# Patient Record
Sex: Male | Born: 1950 | Race: White | Hispanic: No | Marital: Married | Smoking: Former smoker
Health system: Southern US, Community
[De-identification: ages and names within clinical notes are randomized; demographics above are authoritative.]

## PROBLEM LIST (undated history)

## (undated) DIAGNOSIS — Z9889 Other specified postprocedural states: Secondary | ICD-10-CM

## (undated) DIAGNOSIS — E119 Type 2 diabetes mellitus without complications: Secondary | ICD-10-CM

## (undated) DIAGNOSIS — M1990 Unspecified osteoarthritis, unspecified site: Secondary | ICD-10-CM

## (undated) DIAGNOSIS — I10 Essential (primary) hypertension: Secondary | ICD-10-CM

## (undated) DIAGNOSIS — R112 Nausea with vomiting, unspecified: Secondary | ICD-10-CM

## (undated) DIAGNOSIS — G4700 Insomnia, unspecified: Secondary | ICD-10-CM

## (undated) DIAGNOSIS — I2510 Atherosclerotic heart disease of native coronary artery without angina pectoris: Secondary | ICD-10-CM

## (undated) DIAGNOSIS — I639 Cerebral infarction, unspecified: Secondary | ICD-10-CM

## (undated) DIAGNOSIS — I219 Acute myocardial infarction, unspecified: Secondary | ICD-10-CM

## (undated) HISTORY — PX: OTHER SURGICAL HISTORY: SHX169

## (undated) HISTORY — PX: URETHRAL STRICTURE DILATATION: SHX477

## (undated) HISTORY — DX: Cerebral infarction, unspecified: I63.9

## (undated) HISTORY — PX: CARDIAC CATHETERIZATION: SHX172

## (undated) HISTORY — PX: NOSE SURGERY: SHX723

## (undated) HISTORY — DX: Insomnia, unspecified: G47.00

## (undated) HISTORY — PX: COLONOSCOPY W/ POLYPECTOMY: SHX1380

## (undated) HISTORY — PX: PROSTATE BIOPSY: SHX241

---

## 2014-12-03 ENCOUNTER — Emergency Department (HOSPITAL_BASED_OUTPATIENT_CLINIC_OR_DEPARTMENT_OTHER)
Admission: EM | Admit: 2014-12-03 | Discharge: 2014-12-03 | Disposition: A | Discharge status: Home / Self Care | Payer: Worker's Compensation | Attending: Emergency Medicine | Admitting: Emergency Medicine

## 2014-12-03 ENCOUNTER — Emergency Department (HOSPITAL_BASED_OUTPATIENT_CLINIC_OR_DEPARTMENT_OTHER): Payer: Worker's Compensation

## 2014-12-03 ENCOUNTER — Encounter (HOSPITAL_BASED_OUTPATIENT_CLINIC_OR_DEPARTMENT_OTHER): Payer: Self-pay

## 2014-12-03 DIAGNOSIS — I10 Essential (primary) hypertension: Secondary | ICD-10-CM | POA: Insufficient documentation

## 2014-12-03 DIAGNOSIS — I2510 Atherosclerotic heart disease of native coronary artery without angina pectoris: Secondary | ICD-10-CM | POA: Insufficient documentation

## 2014-12-03 DIAGNOSIS — Y9289 Other specified places as the place of occurrence of the external cause: Secondary | ICD-10-CM | POA: Diagnosis not present

## 2014-12-03 DIAGNOSIS — S199XXA Unspecified injury of neck, initial encounter: Secondary | ICD-10-CM | POA: Insufficient documentation

## 2014-12-03 DIAGNOSIS — I252 Old myocardial infarction: Secondary | ICD-10-CM | POA: Insufficient documentation

## 2014-12-03 DIAGNOSIS — S3992XA Unspecified injury of lower back, initial encounter: Secondary | ICD-10-CM | POA: Diagnosis present

## 2014-12-03 DIAGNOSIS — E119 Type 2 diabetes mellitus without complications: Secondary | ICD-10-CM | POA: Diagnosis not present

## 2014-12-03 DIAGNOSIS — Y9389 Activity, other specified: Secondary | ICD-10-CM | POA: Insufficient documentation

## 2014-12-03 DIAGNOSIS — S39012A Strain of muscle, fascia and tendon of lower back, initial encounter: Secondary | ICD-10-CM | POA: Insufficient documentation

## 2014-12-03 DIAGNOSIS — W19XXXA Unspecified fall, initial encounter: Secondary | ICD-10-CM

## 2014-12-03 DIAGNOSIS — Y990 Civilian activity done for income or pay: Secondary | ICD-10-CM | POA: Insufficient documentation

## 2014-12-03 DIAGNOSIS — W108XXA Fall (on) (from) other stairs and steps, initial encounter: Secondary | ICD-10-CM | POA: Diagnosis not present

## 2014-12-03 DIAGNOSIS — S0990XA Unspecified injury of head, initial encounter: Secondary | ICD-10-CM | POA: Diagnosis not present

## 2014-12-03 HISTORY — DX: Essential (primary) hypertension: I10

## 2014-12-03 HISTORY — DX: Atherosclerotic heart disease of native coronary artery without angina pectoris: I25.10

## 2014-12-03 HISTORY — DX: Type 2 diabetes mellitus without complications: E11.9

## 2014-12-03 HISTORY — DX: Acute myocardial infarction, unspecified: I21.9

## 2014-12-03 LAB — BASIC METABOLIC PANEL
Anion gap: 7 (ref 5–15)
BUN: 11 mg/dL (ref 6–23)
CALCIUM: 8.9 mg/dL (ref 8.4–10.5)
CHLORIDE: 105 mmol/L (ref 96–112)
CO2: 25 mmol/L (ref 19–32)
Creatinine, Ser: 0.57 mg/dL (ref 0.50–1.35)
GFR calc Af Amer: >90 mL/min (ref >90)
Glucose, Bld: 146 mg/dL — ABNORMAL HIGH (ref 70–99)
POTASSIUM: 4.4 mmol/L (ref 3.5–5.1)
SODIUM: 137 mmol/L (ref 135–145)

## 2014-12-03 LAB — CBC WITH DIFFERENTIAL/PLATELET
Basophils Absolute: 0 10*3/uL (ref 0.0–0.1)
Basophils Relative: 0 % (ref 0–1)
EOS PCT: 1 % (ref 0–5)
Eosinophils Absolute: 0.1 10*3/uL (ref 0.0–0.7)
HEMATOCRIT: 39.9 % (ref 39.0–52.0)
HEMOGLOBIN: 13.3 g/dL (ref 13.0–17.0)
LYMPHS ABS: 1.9 10*3/uL (ref 0.7–4.0)
LYMPHS PCT: 19 % (ref 12–46)
MCH: 29.4 pg (ref 26.0–34.0)
MCHC: 33.3 g/dL (ref 30.0–36.0)
MCV: 88.1 fL (ref 78.0–100.0)
Monocytes Absolute: 0.6 10*3/uL (ref 0.1–1.0)
Monocytes Relative: 6 % (ref 3–12)
NEUTROS ABS: 7.5 10*3/uL (ref 1.7–7.7)
Neutrophils Relative %: 74 % (ref 43–77)
Platelets: 184 10*3/uL (ref 150–400)
RBC: 4.53 MIL/uL (ref 4.22–5.81)
RDW: 13.6 % (ref 11.5–15.5)
WBC: 10.1 10*3/uL (ref 4.0–10.5)

## 2014-12-03 MED ORDER — SODIUM CHLORIDE 0.9 % IV BOLUS (SEPSIS)
500.0000 mL | Freq: Once | INTRAVENOUS | Status: AC
  Administered 2014-12-03: 500 mL via INTRAVENOUS

## 2014-12-03 MED ORDER — ONDANSETRON HCL 4 MG/2ML IJ SOLN
4.0000 mg | Freq: Once | INTRAMUSCULAR | Status: AC
  Administered 2014-12-03: 4 mg via INTRAVENOUS
  Filled 2014-12-03: qty 2

## 2014-12-03 MED ORDER — CYCLOBENZAPRINE HCL 10 MG PO TABS: 10.0000 mg | ORAL_TABLET | Freq: Two times a day (BID) | ORAL | Status: DC | PRN

## 2014-12-03 MED ORDER — SODIUM CHLORIDE 0.9 % IV SOLN
INTRAVENOUS | Status: DC
  Administered 2014-12-03: 12:00:00 via INTRAVENOUS

## 2014-12-03 MED ORDER — FENTANYL CITRATE (PF) 100 MCG/2ML IJ SOLN
50.0000 ug | Freq: Once | INTRAMUSCULAR | Status: AC
  Administered 2014-12-03: 50 ug via INTRAVENOUS
  Filled 2014-12-03: qty 2

## 2014-12-03 MED ORDER — HYDROCODONE-ACETAMINOPHEN 5-325 MG PO TABS: 1.0000 | ORAL_TABLET | Freq: Four times a day (QID) | ORAL | Status: DC | PRN

## 2014-12-03 NOTE — ED Notes (Addendum)
EMS reports fall down 3 wet steps today at work at [employer] - pt states he fell backwards and hit the metal steps - hit his head - pt reports pain to lumbar region, left flank pain - workers comp claim, unknown if UDS required. Pt with C Collar in place and fully immobilized on back board. Pt did urinate on himself.

## 2014-12-03 NOTE — Discharge Instructions (Signed)
CT scan of head neck and lower back without any acute injuries. Expect muscle soreness over the next several days. Take medicine as directed. Rest as much as possible. Follow-up with your doctor if not improving over the next few days. Return for any new or worse symptoms.

## 2014-12-03 NOTE — ED Provider Notes (Signed)
CSN: [id number]     Arrival date & time 12/03/14  1151 History   First MD Initiated Contact with Patient 12/03/14 1200     Chief Complaint  Patient presents with  . Fall     (Consider location/radiation/quality/duration/timing/severity/associated sxs/prior Treatment) Patient is a 64 y.o. male presenting with fall. The history is provided by the EMS personnel and the patient.  Fall Associated symptoms include headaches. Pertinent negatives include no chest pain, no abdominal pain and no shortness of breath.   patient with fall on the job. Fell down 3 metal steps. While working at [employer]. Patient states he fell backward and hit the metal steps hit his head low part of the back. Patient arrived on backboard and c-collar. Backboard was removed clinically shortly after arrival c-collar was kept in place.  This did occur on job but currently no Workmen's Comp. paperwork is present. Not clear whether the job requires her urine drug screen.  Past Medical History  Diagnosis Date  . Hypertension   . Diabetes mellitus without complication   . Coronary artery disease   . Myocardial infarct    History reviewed. No pertinent past surgical history. History reviewed. No pertinent family history. History  Substance Use Topics  . Smoking status: Never Smoker   . Smokeless tobacco: Not on file  . Alcohol Use: Not on file    Review of Systems  Constitutional: Negative for fever.  HENT: Negative for congestion.   Eyes: Negative for visual disturbance.  Respiratory: Negative for shortness of breath.   Cardiovascular: Negative for chest pain.  Gastrointestinal: Negative for nausea, vomiting and abdominal pain.  Genitourinary: Negative for dysuria.  Musculoskeletal: Positive for back pain and neck pain.  Skin: Negative for rash.  Neurological: Positive for headaches. Negative for dizziness, seizures, syncope, weakness and numbness.  Hematological: Does not bruise/bleed easily.   Psychiatric/Behavioral: Negative for confusion.      Allergies  Review of patient's allergies indicates no known allergies.  Home Medications   Prior to Admission medications   Medication Sig Start Date End Date Taking? Authorizing Provider  ATORVASTATIN CALCIUM PO Take by mouth.   Yes Historical Provider, MD  GLIPIZIDE PO Take by mouth.   Yes Historical Provider, MD  METFORMIN HCL PO Take by mouth.   Yes Historical Provider, MD  METOPROLOL SUCCINATE ER PO Take by mouth.   Yes Historical Provider, MD  cyclobenzaprine (FLEXERIL) 10 MG tablet Take 1 tablet (10 mg total) by mouth 2 (two) times daily as needed for muscle spasms. 12/03/14   Fredia Sorrow, MD  HYDROcodone-acetaminophen (NORCO/VICODIN) 5-325 MG per tablet Take 1-2 tablets by mouth every 6 (six) hours as needed for moderate pain. 12/03/14   Fredia Sorrow, MD   BP 144/84 mmHg  Pulse 70  Temp(Src) 97.7 F (36.5 C) (Oral)  Resp 16  Ht 5\' 10"  (1.778 m)  Wt 220 lb (99.791 kg)  BMI 31.57 kg/m2  SpO2 96% Physical Exam  Constitutional: He is oriented to person, place, and time. He appears well-developed and well-nourished. No distress.  HENT:  Head: Normocephalic and atraumatic.  Eyes: Conjunctivae and EOM are normal. Pupils are equal, round, and reactive to light.  Neck: Normal range of motion.  Cardiovascular: Normal rate and regular rhythm.   No murmur heard. Pulmonary/Chest: Effort normal and breath sounds normal. No respiratory distress.  Abdominal: Soft. Bowel sounds are normal. There is no tenderness.  Musculoskeletal: Normal range of motion. He exhibits edema and tenderness.  Some tenderness to palpation along  the lumbar part of the back. No evidence of any skin injury or abrasion.  Neurological: He is alert and oriented to person, place, and time. No cranial nerve deficit. He exhibits normal muscle tone. Coordination normal.  Skin: Skin is warm. No rash noted.  Nursing note and vitals reviewed.   ED Course   Procedures (including critical care time) Labs Review Labs Reviewed  BASIC METABOLIC PANEL - Abnormal; Notable for the following:    Glucose, Bld 146 (*)    All other components within normal limits  CBC WITH DIFFERENTIAL/PLATELET    Imaging Review Ct Head Wo Contrast  12/03/2014   CLINICAL DATA:  64 year old male with a history of fall. Cervical back pain.  EXAM: CT HEAD WITHOUT CONTRAST  CT CERVICAL SPINE WITHOUT CONTRAST  TECHNIQUE: Multidetector CT imaging of the head and cervical spine was performed following the standard protocol without intravenous contrast. Multiplanar CT image reconstructions of the cervical spine were also generated.  COMPARISON:  None.  FINDINGS: CT HEAD FINDINGS  Unremarkable appearance of the calvarium without acute fracture or aggressive lesion.  Unremarkable appearance of the scalp soft tissues.  Unremarkable appearance of the bilateral orbits.  Mastoid air cells are clear.  No significant paranasal sinus disease  Rightward angulation of the anterior nasal septum favored to represent chronic injury.  No acute intracranial hemorrhage, midline shift, or mass effect.  Gray-white differentiation is maintained, without CT evidence of acute ischemia.  Unremarkable configuration of the ventricles.  Intracranial atherosclerotic calcifications of the anterior circulation.  CT CERVICAL SPINE FINDINGS  Alignment of the cervical spine from C1-T1 maintained.  Cranio-cervical junction maintained.  Unremarkable appearance of the visualized posterior fossa.  No fracture identified.  Vertebral body heights maintained.  C1-C2: Degenerative changes at the atlantodental interface.  C2-C3: No significant bony canal narrowing. Uncovertebral joint disease, worst on the right.  C3-C4: No significant bony canal narrowing. Uncovertebral joint disease, worst on the right.  C4-C5: Bilateral uncovertebral joint disease without significant canal narrowing.  C5-C6: Bilateral uncovertebral joint disease  without significant canal narrowing.  C6-C7: Bilateral uncovertebral joint disease, worst on the left. No significant canal narrowing.  C7-T1: Posterior disc osteophyte complex without significant canal narrowing.  Upper thoracic spine:  Unremarkable.  Prevertebral soft tissues within normal limits.  No paravertebral mass lesion is present.  13 mm left thyroid nodule.  Unremarkable appearance of the aerodigestive mucosa.  Unremarkable appearance of the visualized upper lungs.  IMPRESSION: Head:  No CT evidence of acute intracranial abnormality.  Cervical Spine:  No CT evidence of acute fracture or malalignment of the cervical spine.  Mild degenerative changes, as above.  Left-sided 13 mm thyroid nodule. If there is concern for thyroid disease, referral for elective diagnostic ultrasound may be useful.  Signed,  Dulcy Fanny. Earleen Newport, DO  [facility]  [facility]   Electronically Signed   By: Corrie Mckusick D.O.   On: 12/03/2014 13:38   Ct Cervical Spine Wo Contrast  12/03/2014   CLINICAL DATA:  64 year old male with a history of fall. Cervical back pain.  EXAM: CT HEAD WITHOUT CONTRAST  CT CERVICAL SPINE WITHOUT CONTRAST  TECHNIQUE: Multidetector CT imaging of the head and cervical spine was performed following the standard protocol without intravenous contrast. Multiplanar CT image reconstructions of the cervical spine were also generated.  COMPARISON:  None.  FINDINGS: CT HEAD FINDINGS  Unremarkable appearance of the calvarium without acute fracture or aggressive lesion.  Unremarkable appearance of the scalp soft tissues.  Unremarkable appearance of the bilateral orbits.  Mastoid air cells are clear.  No significant paranasal sinus disease  Rightward angulation of the anterior nasal septum favored to represent chronic injury.  No acute intracranial hemorrhage, midline shift, or mass effect.  Gray-white differentiation is maintained, without CT evidence of acute ischemia.   Unremarkable configuration of the ventricles.  Intracranial atherosclerotic calcifications of the anterior circulation.  CT CERVICAL SPINE FINDINGS  Alignment of the cervical spine from C1-T1 maintained.  Cranio-cervical junction maintained.  Unremarkable appearance of the visualized posterior fossa.  No fracture identified.  Vertebral body heights maintained.  C1-C2: Degenerative changes at the atlantodental interface.  C2-C3: No significant bony canal narrowing. Uncovertebral joint disease, worst on the right.  C3-C4: No significant bony canal narrowing. Uncovertebral joint disease, worst on the right.  C4-C5: Bilateral uncovertebral joint disease without significant canal narrowing.  C5-C6: Bilateral uncovertebral joint disease without significant canal narrowing.  C6-C7: Bilateral uncovertebral joint disease, worst on the left. No significant canal narrowing.  C7-T1: Posterior disc osteophyte complex without significant canal narrowing.  Upper thoracic spine:  Unremarkable.  Prevertebral soft tissues within normal limits.  No paravertebral mass lesion is present.  13 mm left thyroid nodule.  Unremarkable appearance of the aerodigestive mucosa.  Unremarkable appearance of the visualized upper lungs.  IMPRESSION: Head:  No CT evidence of acute intracranial abnormality.  Cervical Spine:  No CT evidence of acute fracture or malalignment of the cervical spine.  Mild degenerative changes, as above.  Left-sided 13 mm thyroid nodule. If there is concern for thyroid disease, referral for elective diagnostic ultrasound may be useful.  Signed,  Dulcy Fanny. Earleen Newport, DO  [facility]  [facility]   Electronically Signed   By: Corrie Mckusick D.O.   On: 12/03/2014 13:38   Ct Lumbar Spine Wo Contrast  12/03/2014   CLINICAL DATA:  [place] down steps.  Back pain  EXAM: CT LUMBAR SPINE WITHOUT CONTRAST  TECHNIQUE: Multidetector CT imaging of the lumbar spine was performed without  intravenous contrast administration. Multiplanar CT image reconstructions were also generated.  COMPARISON:  None.  FINDINGS: Normal lumbar alignment. Negative for fracture or mass lesion. Multilevel degenerative change.  Atherosclerotic aorta without aneurysm. Calcification in the iliac arteries. No retroperitoneal mass.  T12-L1:  Mild disc and facet degeneration  L1-2:  Mild disc and facet degeneration  L2-3: Moderate disc degeneration with disc space narrowing and vacuum disc phenomenon. Disc bulging and spurring. Bilateral facet hypertrophy with. Mild to moderate spinal stenosis.  L3-4: Diffuse disc bulging and facet hypertrophy causing mild spinal stenosis. Mild foraminal stenosis bilaterally  L4-5: Diffuse disc bulging and endplate osteophyte formation. Moderate facet hypertrophy bilaterally with moderate spinal stenosis. Moderate subarticular and foraminal stenosis bilaterally.  L5-S1: Small central disc protrusion with diffuse disc bulging and spondylosis. Bilateral facet hypertrophy. Mild foraminal narrowing bilaterally.  IMPRESSION: Negative for lumbar fracture  Multilevel degenerative change as above.   Electronically Signed   By: Franchot Gallo M.D.   On: 12/03/2014 13:36     EKG Interpretation None      MDM   Final diagnoses:  Fall  Lumbar strain, initial encounter  Head injury, initial encounter      Patient with fall at work down 3 wet steps that were metal. With pain to his lumbar back and head and a little bit to the neck. No loss of consciousness. CT head neck and lumbar back without any acute injuries here. Patient without any neuro deficits. Patient  with stiffness in the low back. C-collar removed after CAT scans patient with good range of motion of the neck. Patient given work note we treated with pain medicine and muscle relaxer. Patient will follow-up with his doctor if not better by Monday or with employer's recommended physician for follow-up.  Fredia Sorrow,  MD 12/03/14 1414

## 2014-12-07 ENCOUNTER — Encounter (HOSPITAL_BASED_OUTPATIENT_CLINIC_OR_DEPARTMENT_OTHER): Payer: Self-pay | Admitting: Emergency Medicine

## 2014-12-07 ENCOUNTER — Emergency Department (HOSPITAL_BASED_OUTPATIENT_CLINIC_OR_DEPARTMENT_OTHER)
Admission: EM | Admit: 2014-12-07 | Discharge: 2014-12-07 | Disposition: A | Discharge status: Home / Self Care | Payer: Worker's Compensation | Attending: Emergency Medicine | Admitting: Emergency Medicine

## 2014-12-07 ENCOUNTER — Emergency Department (HOSPITAL_BASED_OUTPATIENT_CLINIC_OR_DEPARTMENT_OTHER): Payer: Worker's Compensation

## 2014-12-07 DIAGNOSIS — I252 Old myocardial infarction: Secondary | ICD-10-CM | POA: Insufficient documentation

## 2014-12-07 DIAGNOSIS — Z7902 Long term (current) use of antithrombotics/antiplatelets: Secondary | ICD-10-CM | POA: Insufficient documentation

## 2014-12-07 DIAGNOSIS — Z87891 Personal history of nicotine dependence: Secondary | ICD-10-CM | POA: Insufficient documentation

## 2014-12-07 DIAGNOSIS — R109 Unspecified abdominal pain: Secondary | ICD-10-CM | POA: Diagnosis not present

## 2014-12-07 DIAGNOSIS — I10 Essential (primary) hypertension: Secondary | ICD-10-CM | POA: Insufficient documentation

## 2014-12-07 DIAGNOSIS — Z87828 Personal history of other (healed) physical injury and trauma: Secondary | ICD-10-CM | POA: Diagnosis not present

## 2014-12-07 DIAGNOSIS — E119 Type 2 diabetes mellitus without complications: Secondary | ICD-10-CM | POA: Diagnosis not present

## 2014-12-07 DIAGNOSIS — I2510 Atherosclerotic heart disease of native coronary artery without angina pectoris: Secondary | ICD-10-CM | POA: Diagnosis not present

## 2014-12-07 DIAGNOSIS — Z79899 Other long term (current) drug therapy: Secondary | ICD-10-CM | POA: Insufficient documentation

## 2014-12-07 DIAGNOSIS — M549 Dorsalgia, unspecified: Secondary | ICD-10-CM | POA: Diagnosis present

## 2014-12-07 LAB — CBC WITH DIFFERENTIAL/PLATELET
BASOS ABS: 0 10*3/uL (ref 0.0–0.1)
Basophils Relative: 0 % (ref 0–1)
Eosinophils Absolute: 0.2 10*3/uL (ref 0.0–0.7)
Eosinophils Relative: 2 % (ref 0–5)
HEMATOCRIT: 40.7 % (ref 39.0–52.0)
Hemoglobin: 13.6 g/dL (ref 13.0–17.0)
LYMPHS PCT: 19 % (ref 12–46)
Lymphs Abs: 2 10*3/uL (ref 0.7–4.0)
MCH: 29.5 pg (ref 26.0–34.0)
MCHC: 33.4 g/dL (ref 30.0–36.0)
MCV: 88.3 fL (ref 78.0–100.0)
MONOS PCT: 7 % (ref 3–12)
Monocytes Absolute: 0.8 10*3/uL (ref 0.1–1.0)
NEUTROS ABS: 7.7 10*3/uL (ref 1.7–7.7)
Neutrophils Relative %: 72 % (ref 43–77)
PLATELETS: 210 10*3/uL (ref 150–400)
RBC: 4.61 MIL/uL (ref 4.22–5.81)
RDW: 13.7 % (ref 11.5–15.5)
WBC: 10.8 10*3/uL — AB (ref 4.0–10.5)

## 2014-12-07 LAB — BASIC METABOLIC PANEL
Anion gap: 7 (ref 5–15)
BUN: 12 mg/dL (ref 6–23)
CO2: 27 mmol/L (ref 19–32)
Calcium: 9.4 mg/dL (ref 8.4–10.5)
Chloride: 101 mmol/L (ref 96–112)
Creatinine, Ser: 0.69 mg/dL (ref 0.50–1.35)
GFR calc non Af Amer: >90 mL/min (ref >90)
GLUCOSE: 156 mg/dL — AB (ref 70–99)
POTASSIUM: 4.1 mmol/L (ref 3.5–5.1)
Sodium: 135 mmol/L (ref 135–145)

## 2014-12-07 MED ORDER — SODIUM CHLORIDE 0.9 % IV BOLUS (SEPSIS)
500.0000 mL | Freq: Once | INTRAVENOUS | Status: AC
  Administered 2014-12-07: 500 mL via INTRAVENOUS

## 2014-12-07 MED ORDER — IOHEXOL 300 MG/ML  SOLN
50.0000 mL | Freq: Once | INTRAMUSCULAR | Status: AC | PRN
  Administered 2014-12-07: 50 mL via ORAL

## 2014-12-07 MED ORDER — IOHEXOL 300 MG/ML  SOLN
100.0000 mL | Freq: Once | INTRAMUSCULAR | Status: AC | PRN
  Administered 2014-12-07: 100 mL via INTRAVENOUS

## 2014-12-07 MED ORDER — METAXALONE 800 MG PO TABS: 400.0000 mg | ORAL_TABLET | Freq: Three times a day (TID) | ORAL | Status: DC | PRN

## 2014-12-07 MED ORDER — FENTANYL CITRATE (PF) 100 MCG/2ML IJ SOLN
100.0000 ug | Freq: Once | INTRAMUSCULAR | Status: AC
  Administered 2014-12-07: 50 ug via INTRAVENOUS
  Filled 2014-12-07: qty 2

## 2014-12-07 MED ORDER — OXYCODONE-ACETAMINOPHEN 5-325 MG PO TABS: 1.0000 | ORAL_TABLET | Freq: Four times a day (QID) | ORAL | Status: DC | PRN

## 2014-12-07 NOTE — ED Notes (Signed)
64 yo male c/o left sided back pain since Friday when he feel down wet steps. Pt seen here last Friday but states that he is still in excruciating pain since incident. Pt in wheelchair at triage.

## 2014-12-07 NOTE — ED Provider Notes (Signed)
CSN: [id number]     Arrival date & time 12/07/14  1657 History   First MD Initiated Contact with Patient 12/07/14 1722     Chief Complaint  Patient presents with  . Back Pain    left sided back pain     (Consider location/radiation/quality/duration/timing/severity/associated sxs/prior Treatment) Patient is a 64 y.o. male presenting with back pain. The history is provided by the patient.  Back Pain Associated symptoms: no abdominal pain, no chest pain, no headaches, no numbness and no weakness    patient presents with back pain. 4 days ago he fell down some stairs at work. Reportedly landing on his back. Was seen in the ER and had negative CTs of the spine at that time. He states now the pain is more in his left flank. It is worse with movement. It is severe. He states the medicines he was given just makes him sleepy and he wakes up in the pain is unchanged. No blood in the urine. 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  Past Medical History  Diagnosis Date  . Hypertension   . Diabetes mellitus without complication   . Coronary artery disease   . Myocardial infarct    History reviewed. No pertinent past surgical history. No family history on file. History  Substance Use Topics  . Smoking status: Former Research scientist (life sciences)  . Smokeless tobacco: Not on file  . Alcohol Use: No    Review of Systems  Constitutional: Negative for activity change and appetite change.  Eyes: Negative for pain.  Respiratory: Negative for chest tightness and shortness of breath.   Cardiovascular: Negative for chest pain and leg swelling.  Gastrointestinal: Negative for nausea, vomiting, abdominal pain and diarrhea.  Genitourinary: Positive for flank pain.  Musculoskeletal: Positive for back pain. Negative for neck stiffness.  Skin: Negative for rash.  Neurological: Negative for weakness, numbness and headaches.  Psychiatric/Behavioral: Negative for behavioral problems.      Allergies  Review of patient's  allergies indicates no known allergies.  Home Medications   Prior to Admission medications   Medication Sig Start Date End Date Taking? Authorizing Provider  ATORVASTATIN CALCIUM PO Take 40 mg by mouth 1 day or 1 dose.    Yes Historical Provider, MD  clopidogrel (PLAVIX) 75 MG tablet Take 75 mg by mouth daily.   Yes Historical Provider, MD  cyclobenzaprine (FLEXERIL) 10 MG tablet Take 1 tablet (10 mg total) by mouth 2 (two) times daily as needed for muscle spasms. 12/03/14  Yes Fredia Sorrow, MD  GLIPIZIDE PO Take 10 mg by mouth daily.    Yes Historical Provider, MD  HYDROcodone-acetaminophen (NORCO/VICODIN) 5-325 MG per tablet Take 1-2 tablets by mouth every 6 (six) hours as needed for moderate pain. 12/03/14  Yes Fredia Sorrow, MD  METFORMIN HCL PO Take 500 mg by mouth 2 (two) times daily.    Yes Historical Provider, MD  METOPROLOL SUCCINATE ER PO Take 50 mg by mouth daily.    Yes Historical Provider, MD  metaxalone (SKELAXIN) 800 MG tablet Take 0.5 tablets (400 mg total) by mouth 3 (three) times daily as needed for muscle spasms. 12/07/14   Davonna Belling, MD  oxyCODONE-acetaminophen (PERCOCET/ROXICET) 5-325 MG per tablet Take 1-2 tablets by mouth every 6 (six) hours as needed for severe pain. 12/07/14   Davonna Belling, MD   BP 144/81 mmHg  Pulse 94  Temp(Src) 98.7 F (37.1 C) (Oral)  Resp 20  Ht 5\' 10"  (1.778 m)  Wt 220 lb (99.791 kg)  BMI 31.57 kg/m2  SpO2 95% Physical Exam  Constitutional: He is oriented to person, place, and time. He appears well-developed and well-nourished.  Patient appears uncomfortable  HENT:  Head: Atraumatic.  Eyes: EOM are normal.  Neck: Neck supple.  Cardiovascular: Normal rate and regular rhythm.   Pulmonary/Chest: Effort normal.  Moderate tenderness to left CVA area. No crepitance or deformity.  Abdominal: There is no tenderness.  Musculoskeletal: He exhibits tenderness.  Neurological: He is alert and oriented to person, place, and time.   Skin: Skin is warm.    ED Course  Procedures (including critical care time) Labs Review Labs Reviewed  CBC WITH DIFFERENTIAL/PLATELET - Abnormal; Notable for the following:    WBC 10.8 (*)    All other components within normal limits  BASIC METABOLIC PANEL - Abnormal; Notable for the following:    Glucose, Bld 156 (*)    All other components within normal limits    Imaging Review Dg Ribs Unilateral W/chest Left  12/07/2014   CLINICAL DATA:  Pain following fall 4 days prior  EXAM: LEFT RIBS AND CHEST - 3+ VIEW  COMPARISON:  None.  FINDINGS: Frontal chest as well as oblique and cone-down lower rib images obtained. Lungs are clear. Heart size and pulmonary vascularity are normal. No adenopathy.  No effusion or pneumothorax. There are nondisplaced fractures of the anterior left fifth, sixth, and seventh ribs.  IMPRESSION: Nondisplaced fractures anterior left fifth, sixth, and seventh ribs. No pneumothorax. Lungs clear.   Electronically Signed   By: Lowella Grip III M.D.   On: 12/07/2014 19:57   Ct Abdomen Pelvis W Contrast  12/07/2014   CLINICAL DATA:  Fall down stairs 4 days ago. Persistent acute left flank pain and pelvic pain.  EXAM: CT ABDOMEN AND PELVIS WITH CONTRAST  TECHNIQUE: Multidetector CT imaging of the abdomen and pelvis was performed using the standard protocol following bolus administration of intravenous contrast.  CONTRAST:  15mL OMNIPAQUE IOHEXOL 300 MG/ML SOLN, 148mL OMNIPAQUE IOHEXOL 300 MG/ML SOLN  COMPARISON:  None.  FINDINGS: Lower chest: Scattered bibasilar atelectasis. No pericardial or pleural effusion. No lower lobe pneumonia. Normal heart size. No displaced lower rib fracture.  Abdomen: Liver, gallbladder, biliary system, pancreas, spleen, adrenal glands, and kidneys are within normal limits for age and demonstrate no acute process or injury.  Negative for bowel obstruction, ileus, or free air. Large amount of retained stool and air throughout the colon compatible  with constipation.  Aortic atherosclerosis noted without aneurysm. No retroperitoneal abnormality or hemorrhage.  Normal appendix demonstrated.  Pelvis: Aortoiliac atherosclerosis noted. No acute vascular process.  Urinary bladder unremarkable. No pelvic free fluid, fluid collection, hemorrhage, abscess, adenopathy, inguinal abnormality, or hernia.  No abdominal wall soft tissue asymmetry or hematoma.  Diffuse degenerative changes of the spine, SI joints, and hips. No acute osseous finding.  IMPRESSION: No acute intra-abdominal or pelvic finding or injury.  Large volume of retained colonic stool compatible with constipation.   Electronically Signed   By: Jerilynn Mages.  Shick M.D.   On: 12/07/2014 19:16     EKG Interpretation None      MDM   Final diagnoses:  Flank pain    Back pain after fall downstairs a few days ago. CT scan done since pain is now more over the flank. No clear cause. Likely musculoskeletal. Will discharge home.    Davonna Belling, MD 12/08/14 [phone]

## 2014-12-07 NOTE — Discharge Instructions (Signed)
Flank Pain °Flank pain refers to pain that is located on the side of the body between the upper abdomen and the back. The pain may occur over a short period of time (acute) or may be long-term or reoccurring (chronic). It may be mild or severe. Flank pain can be caused by many things. °CAUSES  °Some of the more common causes of flank pain include: °· Muscle strains.   °· Muscle spasms.   °· A disease of your spine (vertebral disk disease).   °· A lung infection (pneumonia).   °· Fluid around your lungs (pulmonary edema).   °· A kidney infection.   °· Kidney stones.   °· A very painful skin rash caused by the chickenpox virus (shingles).   °· Gallbladder disease.   °HOME CARE INSTRUCTIONS  °Home care will depend on the cause of your pain. In general, °· Rest as directed by your caregiver. °· Drink enough fluids to keep your urine clear or pale yellow. °· Only take over-the-counter or prescription medicines as directed by your caregiver. Some medicines may help relieve the pain. °· Tell your caregiver about any changes in your pain. °· Follow up with your caregiver as directed. °SEEK IMMEDIATE MEDICAL CARE IF:  °· Your pain is not controlled with medicine.   °· You have new or worsening symptoms. °· Your pain increases.   °· You have abdominal pain.   °· You have shortness of breath.   °· You have persistent nausea or vomiting.   °· You have swelling in your abdomen.   °· You feel faint or pass out.   °· You have blood in your urine. °· You have a fever or persistent symptoms for more than 2-3 days. °· You have a fever and your symptoms suddenly get worse. °MAKE SURE YOU:  °· Understand these instructions. °· Will watch your condition. °· Will get help right away if you are not doing well or get worse. °Document Released: 09/20/2005 Document Revised: 04/23/2012 Document Reviewed: 03/13/2012 °[employer]® Patient Information ©2015 [employer]. This information is not intended to replace advice given to you by your  health care provider. Make sure you discuss any questions you have with your health care provider. ° °

## 2014-12-07 NOTE — ED Notes (Signed)
Explained to Dr. Alvino Chapel that Fentanyl had been reduced to 50 mcg and 50 mcg wasted in sink with RN Masten and with RN Masten in pyxis.  Pt. Did get relief and is resting with eyes closed at this time.

## 2015-01-20 ENCOUNTER — Other Ambulatory Visit: Payer: Self-pay | Admitting: Occupational Medicine

## 2015-01-20 ENCOUNTER — Ambulatory Visit: Payer: Self-pay

## 2015-01-20 DIAGNOSIS — R0781 Pleurodynia: Secondary | ICD-10-CM

## 2015-10-31 DIAGNOSIS — I10 Essential (primary) hypertension: Secondary | ICD-10-CM | POA: Insufficient documentation

## 2016-05-13 IMAGING — CT CT L SPINE W/O CM
3 series · 14 of 33 positions shown, 17 images · non-contrast
Comparison: None.

CLINICAL DATA: Fell down steps.  Back pain

EXAM:
CT LUMBAR SPINE WITHOUT CONTRAST
TECHNIQUE: Multidetector CT imaging of the lumbar spine was performed without
intravenous contrast administration. Multiplanar CT image
reconstructions were also generated.

[Series 3: spine 2.0 b31s st · axial · 0.34mm/px · z∈[-356,-170]mm · 6 of 122 slices shown, 8 images]
[im 19/122  soft-tissue]
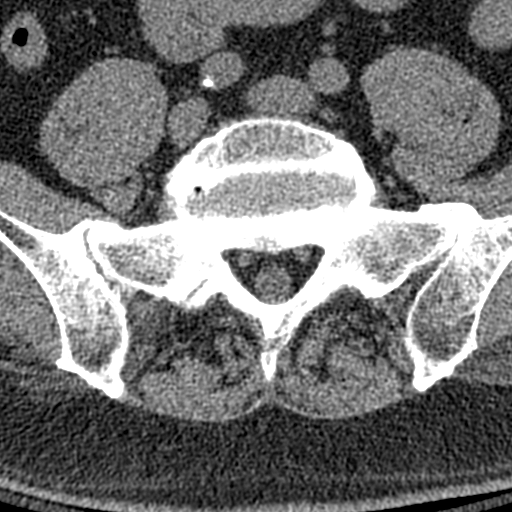
[im 19/122  bone]
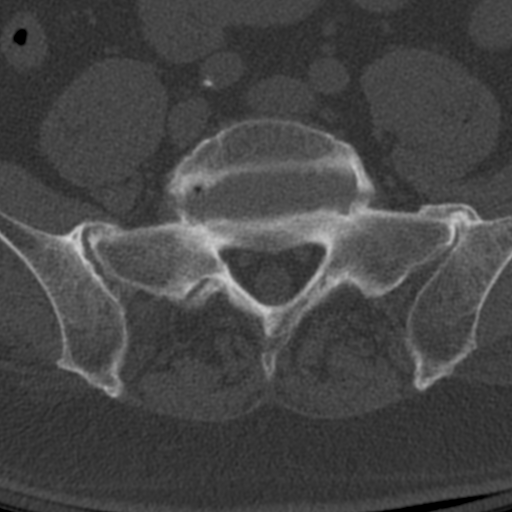
[im 38/122  bone]
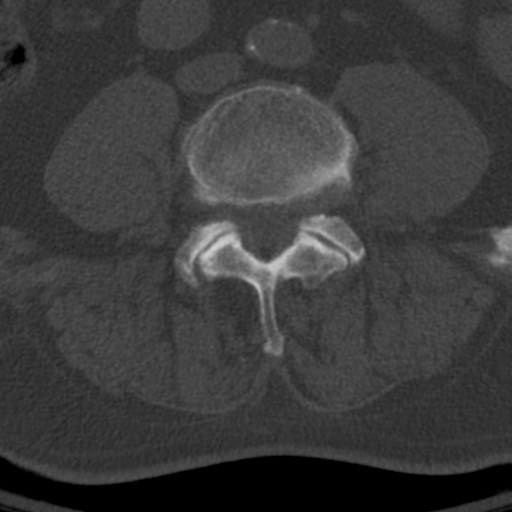
[im 56/122  bone]
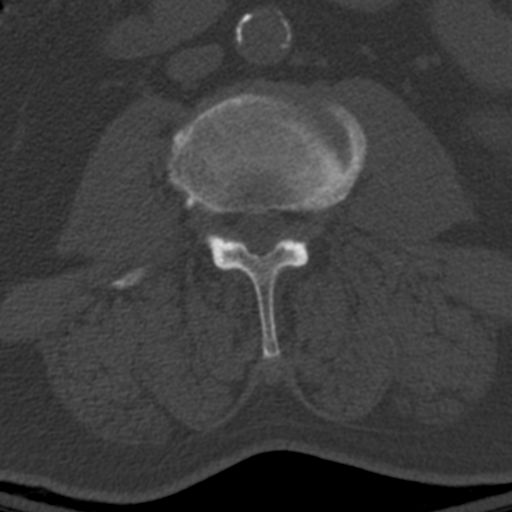
[im 75/122  bone]
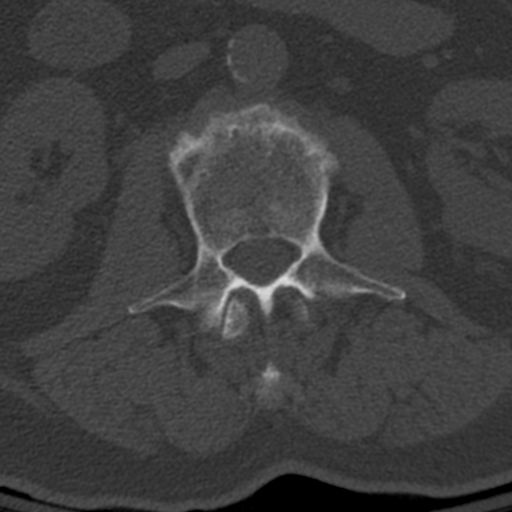
[im 94/122  soft-tissue]
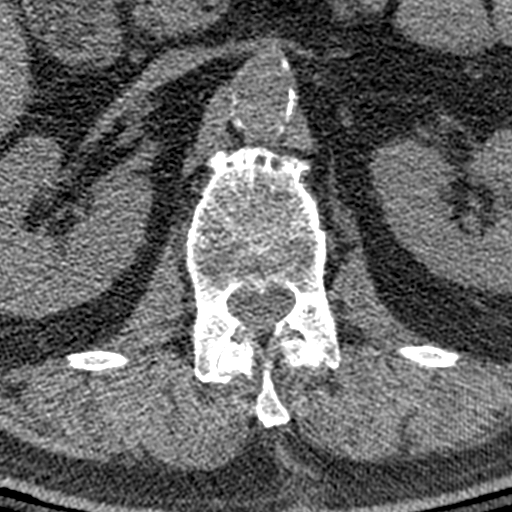
[im 94/122  bone]
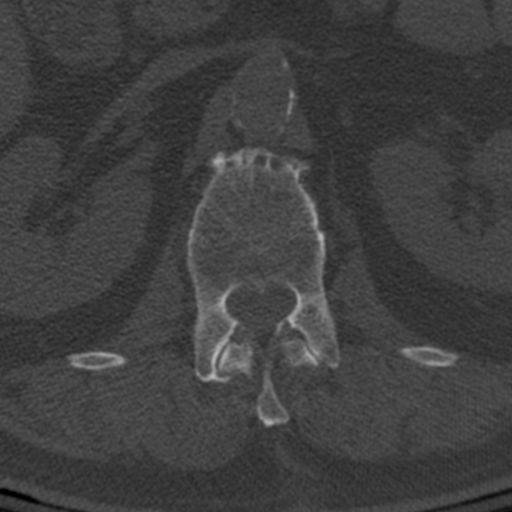
[im 112/122  bone]
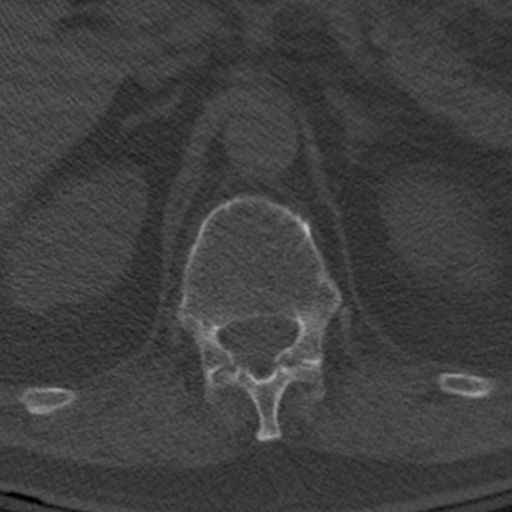

[Series 6: spine 2.0 coronal st · coronal · 0.36mm/px · 3 of 79 slices shown]
[im 16/79  bone]
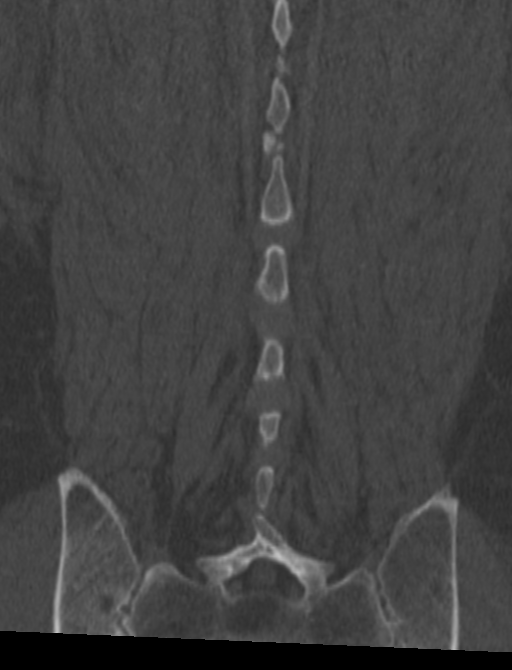
[im 32/79  bone]
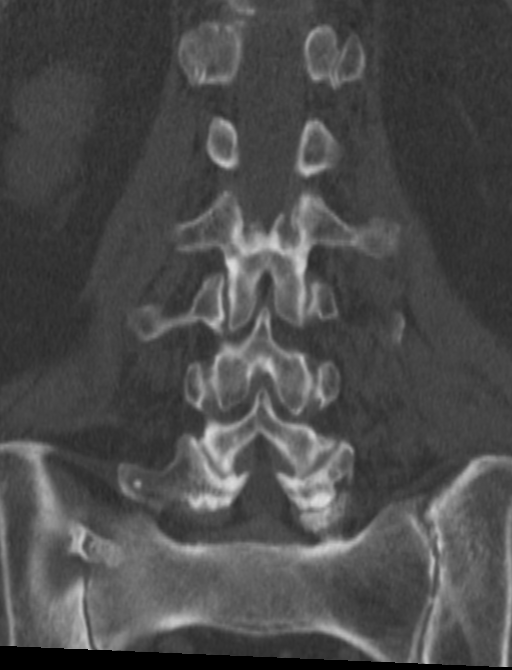
[im 47/79  bone]
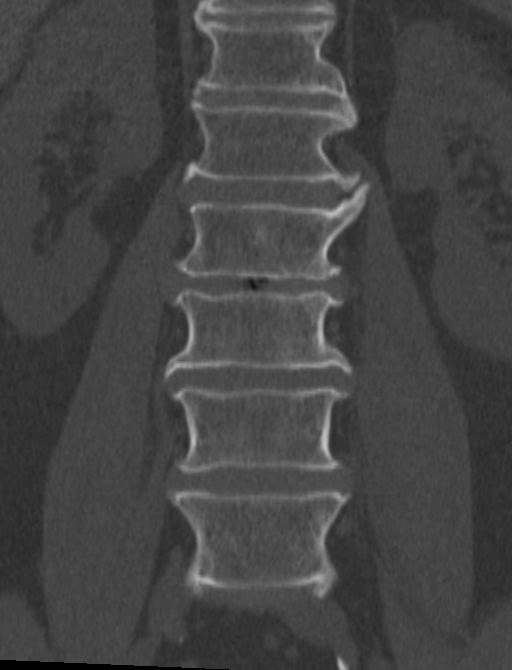

[Series 7: spine 2.0 sagittal st · sagittal · 0.37mm/px · 5 of 84 slices shown, 6 images]
[im 28/84  bone]
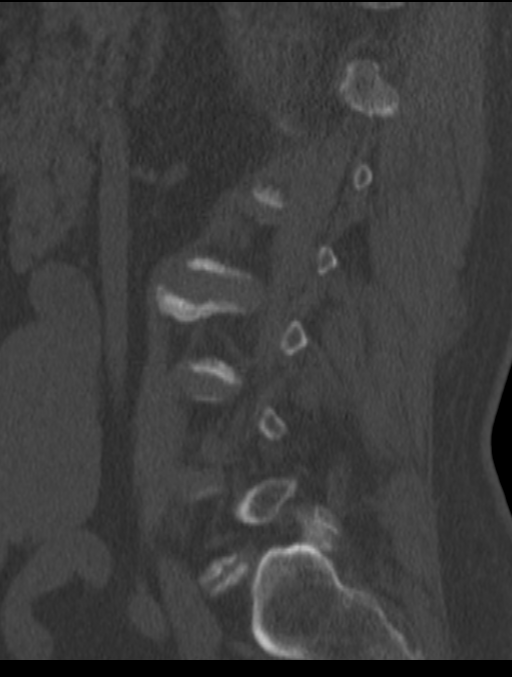
[im 35/84  bone]
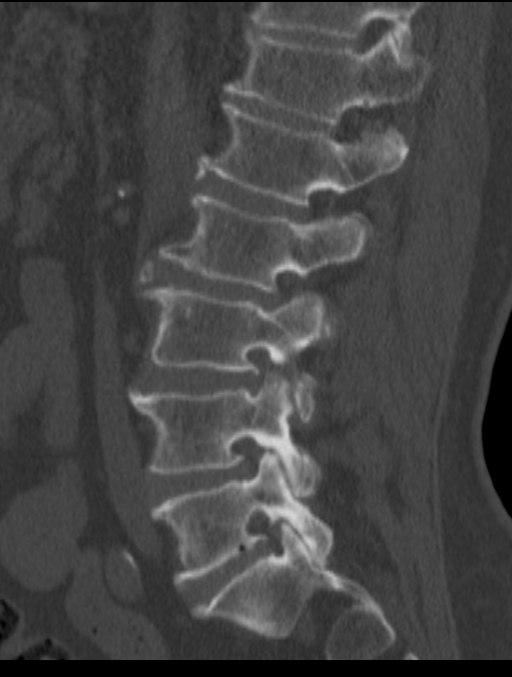
[im 42/84  soft-tissue]
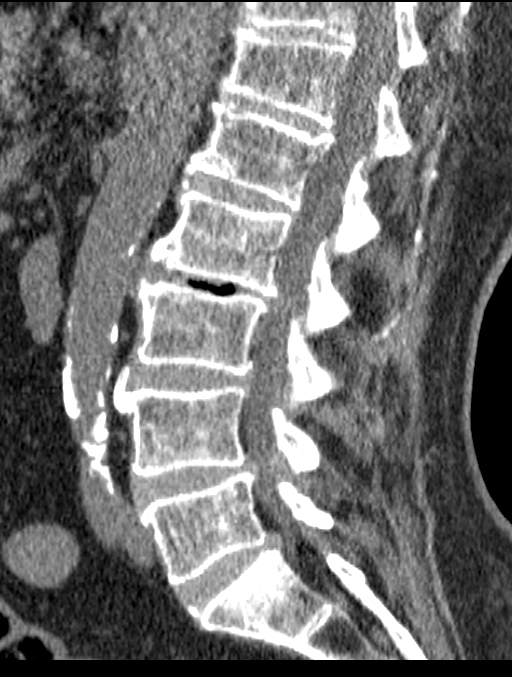
[im 42/84  bone]
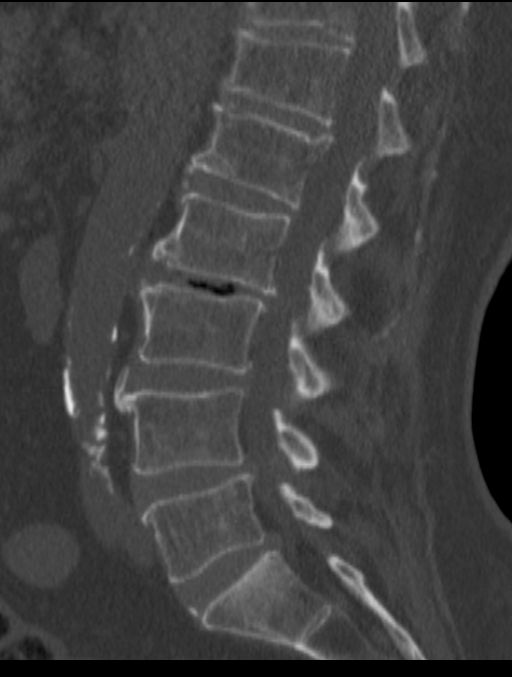
[im 49/84  bone]
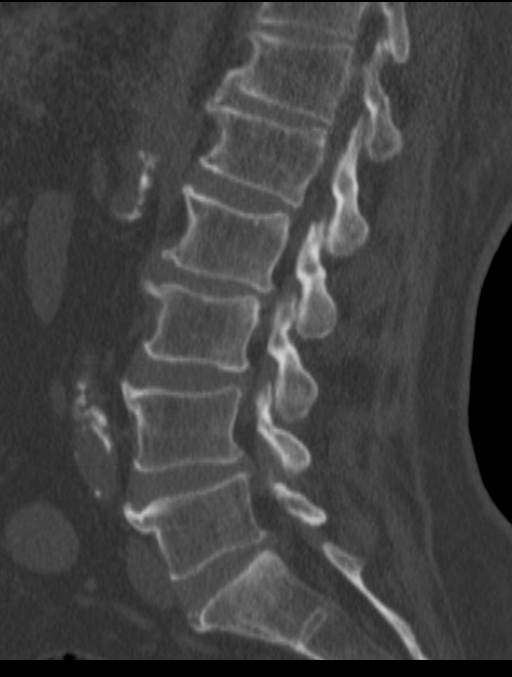
[im 56/84  bone]
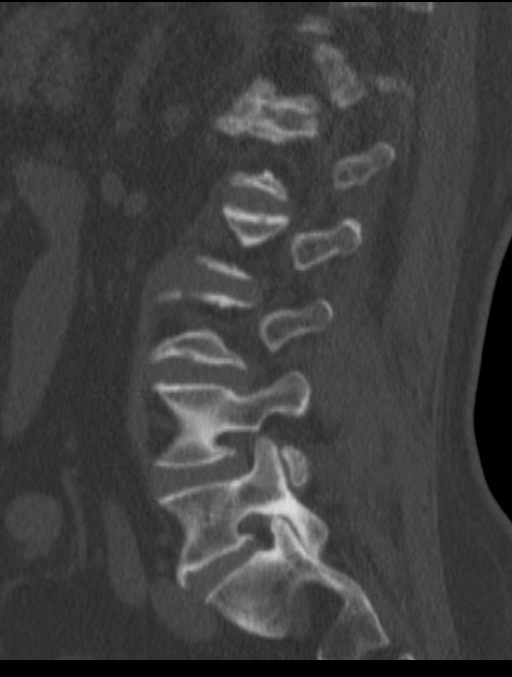

[14 of 33 positions shown; findings below may reference images not displayed]

FINDINGS: Normal lumbar alignment. Negative for fracture or mass lesion.
Multilevel degenerative change.

Atherosclerotic aorta without aneurysm. Calcification in the iliac
arteries. No retroperitoneal mass.

T12-L1:  Mild disc and facet degeneration

L1-2:  Mild disc and facet degeneration

L2-3: Moderate disc degeneration with disc space narrowing and
vacuum disc phenomenon. Disc bulging and spurring. Bilateral facet
hypertrophy with. Mild to moderate spinal stenosis.

L3-4: Diffuse disc bulging and facet hypertrophy causing mild spinal
stenosis. Mild foraminal stenosis bilaterally

L4-5: Diffuse disc bulging and endplate osteophyte formation.
Moderate facet hypertrophy bilaterally with moderate spinal
stenosis. Moderate subarticular and foraminal stenosis bilaterally.

L5-S1: Small central disc protrusion with diffuse disc bulging and
spondylosis. Bilateral facet hypertrophy. Mild foraminal narrowing
bilaterally.
IMPRESSION: Negative for lumbar fracture

Multilevel degenerative change as above.

## 2018-03-04 DIAGNOSIS — I6330 Cerebral infarction due to thrombosis of unspecified cerebral artery: Secondary | ICD-10-CM | POA: Diagnosis not present

## 2018-03-16 DIAGNOSIS — R4781 Slurred speech: Secondary | ICD-10-CM | POA: Diagnosis not present

## 2018-03-16 DIAGNOSIS — R29898 Other symptoms and signs involving the musculoskeletal system: Secondary | ICD-10-CM | POA: Diagnosis not present

## 2018-03-16 DIAGNOSIS — G8324 Monoplegia of upper limb affecting left nondominant side: Secondary | ICD-10-CM | POA: Diagnosis not present

## 2018-03-16 DIAGNOSIS — I10 Essential (primary) hypertension: Secondary | ICD-10-CM | POA: Diagnosis not present

## 2018-03-16 DIAGNOSIS — R2689 Other abnormalities of gait and mobility: Secondary | ICD-10-CM | POA: Diagnosis not present

## 2018-03-16 DIAGNOSIS — E119 Type 2 diabetes mellitus without complications: Secondary | ICD-10-CM | POA: Diagnosis not present

## 2018-03-16 DIAGNOSIS — I2510 Atherosclerotic heart disease of native coronary artery without angina pectoris: Secondary | ICD-10-CM | POA: Diagnosis not present

## 2018-03-16 DIAGNOSIS — S06360A Traumatic hemorrhage of cerebrum, unspecified, without loss of consciousness, initial encounter: Secondary | ICD-10-CM | POA: Diagnosis not present

## 2018-03-16 DIAGNOSIS — R200 Anesthesia of skin: Secondary | ICD-10-CM | POA: Diagnosis not present

## 2018-03-16 DIAGNOSIS — I639 Cerebral infarction, unspecified: Secondary | ICD-10-CM | POA: Diagnosis not present

## 2018-03-16 DIAGNOSIS — Z955 Presence of coronary angioplasty implant and graft: Secondary | ICD-10-CM | POA: Diagnosis not present

## 2018-03-16 DIAGNOSIS — I110 Hypertensive heart disease with heart failure: Secondary | ICD-10-CM | POA: Diagnosis not present

## 2018-03-16 DIAGNOSIS — R0602 Shortness of breath: Secondary | ICD-10-CM | POA: Diagnosis not present

## 2018-03-16 DIAGNOSIS — I5032 Chronic diastolic (congestive) heart failure: Secondary | ICD-10-CM | POA: Diagnosis not present

## 2018-03-16 DIAGNOSIS — I252 Old myocardial infarction: Secondary | ICD-10-CM | POA: Diagnosis not present

## 2018-03-17 DIAGNOSIS — I6523 Occlusion and stenosis of bilateral carotid arteries: Secondary | ICD-10-CM | POA: Diagnosis not present

## 2018-03-17 DIAGNOSIS — E119 Type 2 diabetes mellitus without complications: Secondary | ICD-10-CM | POA: Diagnosis not present

## 2018-03-17 DIAGNOSIS — R200 Anesthesia of skin: Secondary | ICD-10-CM | POA: Diagnosis not present

## 2018-03-17 DIAGNOSIS — I10 Essential (primary) hypertension: Secondary | ICD-10-CM | POA: Diagnosis not present

## 2018-03-17 DIAGNOSIS — R29898 Other symptoms and signs involving the musculoskeletal system: Secondary | ICD-10-CM | POA: Diagnosis not present

## 2018-03-17 DIAGNOSIS — I639 Cerebral infarction, unspecified: Secondary | ICD-10-CM | POA: Diagnosis not present

## 2018-03-17 DIAGNOSIS — I2510 Atherosclerotic heart disease of native coronary artery without angina pectoris: Secondary | ICD-10-CM | POA: Diagnosis not present

## 2018-03-17 DIAGNOSIS — I5032 Chronic diastolic (congestive) heart failure: Secondary | ICD-10-CM | POA: Diagnosis not present

## 2018-03-24 DIAGNOSIS — D6489 Other specified anemias: Secondary | ICD-10-CM | POA: Diagnosis not present

## 2018-03-24 DIAGNOSIS — R202 Paresthesia of skin: Secondary | ICD-10-CM | POA: Diagnosis not present

## 2018-03-24 DIAGNOSIS — I6330 Cerebral infarction due to thrombosis of unspecified cerebral artery: Secondary | ICD-10-CM | POA: Diagnosis not present

## 2018-03-26 DIAGNOSIS — Z794 Long term (current) use of insulin: Secondary | ICD-10-CM | POA: Diagnosis not present

## 2018-03-26 DIAGNOSIS — E119 Type 2 diabetes mellitus without complications: Secondary | ICD-10-CM | POA: Diagnosis not present

## 2018-03-28 DIAGNOSIS — I252 Old myocardial infarction: Secondary | ICD-10-CM | POA: Diagnosis not present

## 2018-03-28 DIAGNOSIS — I2510 Atherosclerotic heart disease of native coronary artery without angina pectoris: Secondary | ICD-10-CM | POA: Insufficient documentation

## 2018-03-28 DIAGNOSIS — I6381 Other cerebral infarction due to occlusion or stenosis of small artery: Secondary | ICD-10-CM

## 2018-03-28 DIAGNOSIS — I10 Essential (primary) hypertension: Secondary | ICD-10-CM | POA: Diagnosis not present

## 2018-03-28 DIAGNOSIS — E119 Type 2 diabetes mellitus without complications: Secondary | ICD-10-CM | POA: Diagnosis not present

## 2018-03-28 DIAGNOSIS — E7800 Pure hypercholesterolemia, unspecified: Secondary | ICD-10-CM | POA: Diagnosis not present

## 2018-03-28 HISTORY — DX: Other cerebral infarction due to occlusion or stenosis of small artery: I63.81

## 2018-03-28 HISTORY — DX: Old myocardial infarction: I25.2

## 2018-03-28 HISTORY — DX: Atherosclerotic heart disease of native coronary artery without angina pectoris: I25.10

## 2018-04-03 ENCOUNTER — Telehealth: Payer: Self-pay | Admitting: Neurology

## 2018-04-03 ENCOUNTER — Encounter: Payer: Self-pay | Admitting: Neurology

## 2018-04-03 ENCOUNTER — Ambulatory Visit: Payer: Medicare HMO | Admitting: Neurology

## 2018-04-03 VITALS — BP 124/73 | HR 85 | Ht 68.0 in | Wt 216.5 lb

## 2018-04-03 DIAGNOSIS — H8110 Benign paroxysmal vertigo, unspecified ear: Secondary | ICD-10-CM

## 2018-04-03 DIAGNOSIS — I639 Cerebral infarction, unspecified: Secondary | ICD-10-CM

## 2018-04-03 DIAGNOSIS — G4733 Obstructive sleep apnea (adult) (pediatric): Secondary | ICD-10-CM | POA: Diagnosis not present

## 2018-04-03 HISTORY — DX: Cerebral infarction, unspecified: I63.9

## 2018-04-03 MED ORDER — CLOPIDOGREL BISULFATE 75 MG PO TABS: 75.0000 mg | ORAL_TABLET | Freq: Every day | ORAL | 4 refills | Status: DC

## 2018-04-03 NOTE — Telephone Encounter (Signed)
Please help patient find out to if [facility] provide vestibular rehab, if not, let him come to [facility] vestibular rehabilitation center

## 2018-04-03 NOTE — Progress Notes (Signed)
PATIENT: Joshua Nelson DOB: 03/30/1951  Chief Complaint  Patient presents with  . Cerebrovascular Accident    He is here with his sister, Sonia Baller and sister-in-law, Hassan Rowan.  He had his first stroke in July 2019 and is here for his hospital follow up.  He was treated at [facility].  He is currenlty on Plavix 75mg  and aspirin 650mg  daily.  He was not sent to PT or OT.  He is still having weakness/numbness in his left arm.  Otherwise, he is back to his baseline.  Marland Kitchen PCP    Darrol Jump, PA-C     HISTORICAL  Yavuz Kirby is a 67 year old male, seen in request by his primary care PA Darrol Jump, to follow-up with his stroke, he is accompanied by his sister Sonia Baller, and sister-in-law Hassan Rowan at today's clinical visit, initial evaluation was on April 03, 2018.  He had past medical history of diabetes since 2014, hyperlipidemia, hypertension, coronary artery disease, former smoker,  I was able to review his most recent cardiology record on March 28, 2018 with Dr. Melvia Heaps, echocardiogram documented ejection fraction of 60% with mild inferior hypokinesis, LDL was in 70s,  He woke up February 25, 2018, noticed left facial numbness, mild slurred speech, later also noticed dense numbness of left hand, left arm, mild clumsiness of left upper extremity,  Was seen by his primary care next day, referred to [facility],  MRI of the brain on July 18th showed acute stroke involving right thalamus  He went to hospital second time on March 17, 2018 for persistent left face arm repeat MRI showed no new changes,  He was taking aspirin 81 mg daily, was switched to aspirin 325 mg daily, plus Plavix 75 mg daily,  CT angiogram of the brain showed intracranial atherosclerotic disease, Ultrasound of carotid arteries showed no large vessel disease.  In addition, he complains of intermittent vertigo spells since 2018, it can happen when he gets up quickly, sometimes lying in bed with his head  tilted to the left side, mild improvement with vestibular maneuver at the hospital.  He also complains of loud snoring, frequent awakening at nighttime, daytime sleepiness   REVIEW OF SYSTEMS: Full 14 system review of systems performed and notable only for as above  ALLERGIES: No Known Allergies  HOME MEDICATIONS: Current Outpatient Medications  Medication Sig Dispense Refill  . aspirin EC 325 MG tablet Take 650 mg by mouth.     Marland Kitchen atorvastatin (LIPITOR) 80 MG tablet Take 80 mg by mouth daily.    . clopidogrel (PLAVIX) 75 MG tablet Take 75 mg by mouth daily.    Marland Kitchen gabapentin (NEURONTIN) 300 MG capsule Take 300 mg by mouth 3 (three) times daily.     Marland Kitchen glimepiride (AMARYL) 4 MG tablet TAKE 1 TABLET BY MOUTH EVERY MORNING    . Insulin Glargine (LANTUS SOLOSTAR) 100 UNIT/ML Solostar Pen INJECT 20 UNITS BY SUBCUTANEOUS ROUTE ONCE DAILY IN THE EVENINGS    . lisinopril (PRINIVIL,ZESTRIL) 2.5 MG tablet Take 2.5 mg by mouth daily.    . sitaGLIPtin-metformin (JANUMET) 50-1000 MG tablet Take 1 tablet by mouth daily.     No current facility-administered medications for this visit.     PAST MEDICAL HISTORY: Past Medical History:  Diagnosis Date  . Coronary artery disease   . CVA (cerebral vascular accident) ([place])   . Diabetes mellitus without complication ([place])   . Hypertension   . Myocardial infarct [facility])     PAST SURGICAL HISTORY: Past Surgical  History:  Procedure Laterality Date  . heart stents     x 3    FAMILY HISTORY: Family History  Problem Relation Age of Onset  . Endometrial cancer Mother   . Healthy Father     SOCIAL HISTORY: Social History   Socioeconomic History  . Marital status: Married    Spouse name: Not on file  . Number of children: 1  . Years of education: 80  . Highest education level: High school graduate  Occupational History  . Occupation: Retired  Scientific laboratory technician  . Financial resource strain: Not on file  . Food insecurity:    Worry: Not on file     Inability: Not on file  . Transportation needs:    Medical: Not on file    Non-medical: Not on file  Tobacco Use  . Smoking status: Former Research scientist (life sciences)  . Smokeless tobacco: Never Used  Substance and Sexual Activity  . Alcohol use: No  . Drug use: Never  . Sexual activity: Not on file  Lifestyle  . Physical activity:    Days per week: Not on file    Minutes per session: Not on file  . Stress: Not on file  Relationships  . Social connections:    Talks on phone: Not on file    Gets together: Not on file    Attends religious service: Not on file    Active member of club or organization: Not on file    Attends meetings of clubs or organizations: Not on file    Relationship status: Not on file  . Intimate partner violence:    Fear of current or ex partner: Not on file    Emotionally abused: Not on file    Physically abused: Not on file    Forced sexual activity: Not on file  Other Topics Concern  . Not on file  Social History Narrative   Lives at home with his wife.   2 cups caffeine per day.   Right-handed.     PHYSICAL EXAM   Vitals:   04/03/18 0859  BP: 124/73  Pulse: 85  Weight: 216 lb 8 oz (98.2 kg)  Height: 5\' 8"  (1.727 m)    Not recorded      Body mass index is 32.92 kg/m.  PHYSICAL EXAMNIATION:  Gen: NAD, conversant, well nourised, obese, well groomed                     Cardiovascular: Regular rate rhythm, no peripheral edema, warm, nontender. Eyes: Conjunctivae clear without exudates or hemorrhage Neck: Supple, no carotid bruits. Pulmonary: Clear to auscultation bilaterally   NEUROLOGICAL EXAM:  MENTAL STATUS: Speech:    Speech is normal; fluent and spontaneous with normal comprehension.  Cognition:     Orientation to time, place and person     Normal recent and remote memory     Normal Attention span and concentration     Normal Language, naming, repeating,spontaneous speech     Fund of knowledge   CRANIAL NERVES: CN II: Visual fields are  full to confrontation. Fundoscopic exam is normal with sharp discs and no vascular changes. Pupils are round equal and briskly reactive to light. CN III, IV, VI: extraocular movement are normal. No ptosis. CN V: Facial sensation is intact to pinprick in all 3 divisions bilaterally. Corneal responses are intact.  CN VII: Face is symmetric with normal eye closure and smile. CN VIII: Hearing is normal to rubbing fingers CN IX, X: Palate elevates  symmetrically. Phonation is normal. CN XI: Head turning and shoulder shrug are intact CN XII: Tongue is midline with normal movements and no atrophy.  MOTOR: Mild left arm pronation drift, fixation on rapid rotating movement,  REFLEXES: Reflexes are 2+ and symmetric at the biceps, triceps, knees, and absent at ankles. Plantar responses are flexor.  SENSORY: Length dependent decreased to light touch, pinprick, and vibratory sensation at toes,  COORDINATION: Rapid alternating movements and fine finger movements are intact. There is no dysmetria on finger-to-nose and heel-knee-shin.    GAIT/STANCE: He needs to sit up to get up from seated position, cautious, mild positive Romberg signs  Apley's maneuver: With left ear dependent position first, after short latency, he would develop rotatory nystagmus, vertigo, habituate quickly,  DIAGNOSTIC DATA (LABS, IMAGING, TESTING) - I reviewed patient records, labs, notes, testing and imaging myself where available.   ASSESSMENT AND PLAN  Mizraim Harmening is a 67 y.o. male   Right thalamic stroke on February 25, 2018,  Consistent with small vessel event  Vascular risk factor of hypertension, diabetes, hyperlipidemia, obesity, history also suggestive of obstructive sleep apnea  Stop aspirin, Plavix 75 mg daily   Obstructive sleep apnea  Refer him to sleep study  Benign positional vertigo  Referral to vestibular rehabilitation  Marcial Pacas, M.D. Ph.D.  [facility] [address] [place], [place] [address] Ph: [phone] Fax: [phone]  CC: Darrol Jump, PA-C

## 2018-04-03 NOTE — Patient Instructions (Signed)
Vestibular rehab.

## 2018-04-04 NOTE — Telephone Encounter (Signed)
Sent to [facility] [phone] - fax [phone].

## 2018-04-07 DIAGNOSIS — B351 Tinea unguium: Secondary | ICD-10-CM | POA: Diagnosis not present

## 2018-04-07 DIAGNOSIS — H6122 Impacted cerumen, left ear: Secondary | ICD-10-CM | POA: Diagnosis not present

## 2018-04-09 DIAGNOSIS — Z8673 Personal history of transient ischemic attack (TIA), and cerebral infarction without residual deficits: Secondary | ICD-10-CM | POA: Diagnosis not present

## 2018-04-09 DIAGNOSIS — H8112 Benign paroxysmal vertigo, left ear: Secondary | ICD-10-CM | POA: Diagnosis not present

## 2018-04-09 DIAGNOSIS — R202 Paresthesia of skin: Secondary | ICD-10-CM | POA: Diagnosis not present

## 2018-04-09 DIAGNOSIS — M6281 Muscle weakness (generalized): Secondary | ICD-10-CM | POA: Diagnosis not present

## 2018-04-09 DIAGNOSIS — R5383 Other fatigue: Secondary | ICD-10-CM | POA: Diagnosis not present

## 2018-04-09 DIAGNOSIS — R2681 Unsteadiness on feet: Secondary | ICD-10-CM | POA: Diagnosis not present

## 2018-04-09 DIAGNOSIS — R2689 Other abnormalities of gait and mobility: Secondary | ICD-10-CM | POA: Diagnosis not present

## 2018-04-11 DIAGNOSIS — H6122 Impacted cerumen, left ear: Secondary | ICD-10-CM | POA: Diagnosis not present

## 2018-04-16 DIAGNOSIS — M6281 Muscle weakness (generalized): Secondary | ICD-10-CM | POA: Diagnosis not present

## 2018-04-16 DIAGNOSIS — R2681 Unsteadiness on feet: Secondary | ICD-10-CM | POA: Diagnosis not present

## 2018-04-16 DIAGNOSIS — R202 Paresthesia of skin: Secondary | ICD-10-CM | POA: Diagnosis not present

## 2018-04-16 DIAGNOSIS — H8112 Benign paroxysmal vertigo, left ear: Secondary | ICD-10-CM | POA: Diagnosis not present

## 2018-04-16 DIAGNOSIS — Z8673 Personal history of transient ischemic attack (TIA), and cerebral infarction without residual deficits: Secondary | ICD-10-CM | POA: Diagnosis not present

## 2018-04-16 DIAGNOSIS — R5383 Other fatigue: Secondary | ICD-10-CM | POA: Diagnosis not present

## 2018-04-16 DIAGNOSIS — R2689 Other abnormalities of gait and mobility: Secondary | ICD-10-CM | POA: Diagnosis not present

## 2018-04-30 DIAGNOSIS — R2681 Unsteadiness on feet: Secondary | ICD-10-CM | POA: Diagnosis not present

## 2018-04-30 DIAGNOSIS — R5383 Other fatigue: Secondary | ICD-10-CM | POA: Diagnosis not present

## 2018-04-30 DIAGNOSIS — M6281 Muscle weakness (generalized): Secondary | ICD-10-CM | POA: Diagnosis not present

## 2018-04-30 DIAGNOSIS — R202 Paresthesia of skin: Secondary | ICD-10-CM | POA: Diagnosis not present

## 2018-04-30 DIAGNOSIS — R2689 Other abnormalities of gait and mobility: Secondary | ICD-10-CM | POA: Diagnosis not present

## 2018-04-30 DIAGNOSIS — Z8673 Personal history of transient ischemic attack (TIA), and cerebral infarction without residual deficits: Secondary | ICD-10-CM | POA: Diagnosis not present

## 2018-04-30 DIAGNOSIS — H8112 Benign paroxysmal vertigo, left ear: Secondary | ICD-10-CM | POA: Diagnosis not present

## 2018-05-07 DIAGNOSIS — D6489 Other specified anemias: Secondary | ICD-10-CM | POA: Diagnosis not present

## 2018-05-08 DIAGNOSIS — R42 Dizziness and giddiness: Secondary | ICD-10-CM | POA: Diagnosis not present

## 2018-06-25 DIAGNOSIS — E782 Mixed hyperlipidemia: Secondary | ICD-10-CM | POA: Diagnosis not present

## 2018-06-25 DIAGNOSIS — M25512 Pain in left shoulder: Secondary | ICD-10-CM | POA: Diagnosis not present

## 2018-06-25 DIAGNOSIS — I2510 Atherosclerotic heart disease of native coronary artery without angina pectoris: Secondary | ICD-10-CM | POA: Diagnosis not present

## 2018-06-25 DIAGNOSIS — F5102 Adjustment insomnia: Secondary | ICD-10-CM | POA: Diagnosis not present

## 2018-06-25 DIAGNOSIS — E1169 Type 2 diabetes mellitus with other specified complication: Secondary | ICD-10-CM | POA: Diagnosis not present

## 2018-09-30 DIAGNOSIS — E782 Mixed hyperlipidemia: Secondary | ICD-10-CM | POA: Diagnosis not present

## 2018-09-30 DIAGNOSIS — E1142 Type 2 diabetes mellitus with diabetic polyneuropathy: Secondary | ICD-10-CM | POA: Diagnosis not present

## 2018-09-30 DIAGNOSIS — I2510 Atherosclerotic heart disease of native coronary artery without angina pectoris: Secondary | ICD-10-CM | POA: Diagnosis not present

## 2018-09-30 DIAGNOSIS — I252 Old myocardial infarction: Secondary | ICD-10-CM | POA: Diagnosis not present

## 2018-09-30 DIAGNOSIS — M25512 Pain in left shoulder: Secondary | ICD-10-CM | POA: Diagnosis not present

## 2019-01-07 DIAGNOSIS — Z0000 Encounter for general adult medical examination without abnormal findings: Secondary | ICD-10-CM | POA: Diagnosis not present

## 2019-01-07 DIAGNOSIS — Z1211 Encounter for screening for malignant neoplasm of colon: Secondary | ICD-10-CM | POA: Diagnosis not present

## 2019-01-07 DIAGNOSIS — E1169 Type 2 diabetes mellitus with other specified complication: Secondary | ICD-10-CM | POA: Diagnosis not present

## 2019-01-07 DIAGNOSIS — E782 Mixed hyperlipidemia: Secondary | ICD-10-CM | POA: Diagnosis not present

## 2019-01-07 DIAGNOSIS — Z125 Encounter for screening for malignant neoplasm of prostate: Secondary | ICD-10-CM | POA: Diagnosis not present

## 2019-01-07 DIAGNOSIS — Z6833 Body mass index (BMI) 33.0-33.9, adult: Secondary | ICD-10-CM | POA: Diagnosis not present

## 2019-03-17 DIAGNOSIS — S42215A Unspecified nondisplaced fracture of surgical neck of left humerus, initial encounter for closed fracture: Secondary | ICD-10-CM | POA: Diagnosis not present

## 2019-03-18 DIAGNOSIS — S42202A Unspecified fracture of upper end of left humerus, initial encounter for closed fracture: Secondary | ICD-10-CM | POA: Diagnosis not present

## 2019-03-18 DIAGNOSIS — S42295A Other nondisplaced fracture of upper end of left humerus, initial encounter for closed fracture: Secondary | ICD-10-CM | POA: Diagnosis not present

## 2019-03-18 DIAGNOSIS — S59902A Unspecified injury of left elbow, initial encounter: Secondary | ICD-10-CM | POA: Diagnosis not present

## 2019-03-18 DIAGNOSIS — S42212A Unspecified displaced fracture of surgical neck of left humerus, initial encounter for closed fracture: Secondary | ICD-10-CM | POA: Diagnosis not present

## 2019-03-26 DIAGNOSIS — S42202A Unspecified fracture of upper end of left humerus, initial encounter for closed fracture: Secondary | ICD-10-CM | POA: Diagnosis not present

## 2019-03-31 DIAGNOSIS — S42292A Other displaced fracture of upper end of left humerus, initial encounter for closed fracture: Secondary | ICD-10-CM | POA: Diagnosis not present

## 2019-03-31 DIAGNOSIS — S42202A Unspecified fracture of upper end of left humerus, initial encounter for closed fracture: Secondary | ICD-10-CM | POA: Diagnosis not present

## 2019-04-16 DIAGNOSIS — S42202A Unspecified fracture of upper end of left humerus, initial encounter for closed fracture: Secondary | ICD-10-CM | POA: Diagnosis not present

## 2019-04-21 DIAGNOSIS — M25512 Pain in left shoulder: Secondary | ICD-10-CM | POA: Diagnosis not present

## 2019-04-21 DIAGNOSIS — M25612 Stiffness of left shoulder, not elsewhere classified: Secondary | ICD-10-CM | POA: Diagnosis not present

## 2019-04-21 DIAGNOSIS — M6281 Muscle weakness (generalized): Secondary | ICD-10-CM | POA: Diagnosis not present

## 2019-04-28 DIAGNOSIS — M6281 Muscle weakness (generalized): Secondary | ICD-10-CM | POA: Diagnosis not present

## 2019-04-28 DIAGNOSIS — M25612 Stiffness of left shoulder, not elsewhere classified: Secondary | ICD-10-CM | POA: Diagnosis not present

## 2019-04-28 DIAGNOSIS — M25512 Pain in left shoulder: Secondary | ICD-10-CM | POA: Diagnosis not present

## 2019-04-30 DIAGNOSIS — M25612 Stiffness of left shoulder, not elsewhere classified: Secondary | ICD-10-CM | POA: Diagnosis not present

## 2019-04-30 DIAGNOSIS — M6281 Muscle weakness (generalized): Secondary | ICD-10-CM | POA: Diagnosis not present

## 2019-04-30 DIAGNOSIS — M25512 Pain in left shoulder: Secondary | ICD-10-CM | POA: Diagnosis not present

## 2019-05-04 DIAGNOSIS — M6281 Muscle weakness (generalized): Secondary | ICD-10-CM | POA: Diagnosis not present

## 2019-05-04 DIAGNOSIS — M25612 Stiffness of left shoulder, not elsewhere classified: Secondary | ICD-10-CM | POA: Diagnosis not present

## 2019-05-04 DIAGNOSIS — M25512 Pain in left shoulder: Secondary | ICD-10-CM | POA: Diagnosis not present

## 2019-05-05 DIAGNOSIS — E1142 Type 2 diabetes mellitus with diabetic polyneuropathy: Secondary | ICD-10-CM | POA: Diagnosis not present

## 2019-05-05 DIAGNOSIS — E782 Mixed hyperlipidemia: Secondary | ICD-10-CM | POA: Diagnosis not present

## 2019-05-05 DIAGNOSIS — I252 Old myocardial infarction: Secondary | ICD-10-CM | POA: Diagnosis not present

## 2019-05-05 DIAGNOSIS — I2510 Atherosclerotic heart disease of native coronary artery without angina pectoris: Secondary | ICD-10-CM | POA: Diagnosis not present

## 2019-05-06 DIAGNOSIS — M25612 Stiffness of left shoulder, not elsewhere classified: Secondary | ICD-10-CM | POA: Diagnosis not present

## 2019-05-06 DIAGNOSIS — M25512 Pain in left shoulder: Secondary | ICD-10-CM | POA: Diagnosis not present

## 2019-05-06 DIAGNOSIS — M6281 Muscle weakness (generalized): Secondary | ICD-10-CM | POA: Diagnosis not present

## 2019-05-11 DIAGNOSIS — M25612 Stiffness of left shoulder, not elsewhere classified: Secondary | ICD-10-CM | POA: Diagnosis not present

## 2019-05-11 DIAGNOSIS — M6281 Muscle weakness (generalized): Secondary | ICD-10-CM | POA: Diagnosis not present

## 2019-05-11 DIAGNOSIS — M25512 Pain in left shoulder: Secondary | ICD-10-CM | POA: Diagnosis not present

## 2019-05-14 DIAGNOSIS — S42202A Unspecified fracture of upper end of left humerus, initial encounter for closed fracture: Secondary | ICD-10-CM | POA: Diagnosis not present

## 2019-05-14 DIAGNOSIS — M25512 Pain in left shoulder: Secondary | ICD-10-CM | POA: Diagnosis not present

## 2019-05-14 DIAGNOSIS — M25612 Stiffness of left shoulder, not elsewhere classified: Secondary | ICD-10-CM | POA: Diagnosis not present

## 2019-05-14 DIAGNOSIS — M6281 Muscle weakness (generalized): Secondary | ICD-10-CM | POA: Diagnosis not present

## 2019-07-02 DIAGNOSIS — S42292G Other displaced fracture of upper end of left humerus, subsequent encounter for fracture with delayed healing: Secondary | ICD-10-CM | POA: Diagnosis not present

## 2019-07-03 DIAGNOSIS — S42292G Other displaced fracture of upper end of left humerus, subsequent encounter for fracture with delayed healing: Secondary | ICD-10-CM | POA: Diagnosis not present

## 2019-07-30 DIAGNOSIS — R197 Diarrhea, unspecified: Secondary | ICD-10-CM | POA: Diagnosis not present

## 2019-07-30 DIAGNOSIS — Z1159 Encounter for screening for other viral diseases: Secondary | ICD-10-CM | POA: Diagnosis not present

## 2019-07-31 DIAGNOSIS — R197 Diarrhea, unspecified: Secondary | ICD-10-CM | POA: Diagnosis not present

## 2019-07-31 DIAGNOSIS — M791 Myalgia, unspecified site: Secondary | ICD-10-CM | POA: Diagnosis not present

## 2019-08-05 DIAGNOSIS — E782 Mixed hyperlipidemia: Secondary | ICD-10-CM | POA: Diagnosis not present

## 2019-08-05 DIAGNOSIS — I2510 Atherosclerotic heart disease of native coronary artery without angina pectoris: Secondary | ICD-10-CM | POA: Diagnosis not present

## 2019-08-05 DIAGNOSIS — E1142 Type 2 diabetes mellitus with diabetic polyneuropathy: Secondary | ICD-10-CM | POA: Diagnosis not present

## 2019-08-06 DIAGNOSIS — E1142 Type 2 diabetes mellitus with diabetic polyneuropathy: Secondary | ICD-10-CM | POA: Diagnosis not present

## 2019-08-06 DIAGNOSIS — E782 Mixed hyperlipidemia: Secondary | ICD-10-CM | POA: Diagnosis not present

## 2019-08-12 DIAGNOSIS — Z23 Encounter for immunization: Secondary | ICD-10-CM | POA: Diagnosis not present

## 2019-08-14 HISTORY — PX: OTHER SURGICAL HISTORY: SHX169

## 2019-09-24 ENCOUNTER — Other Ambulatory Visit: Payer: Self-pay | Admitting: Physician Assistant

## 2019-09-24 MED ORDER — SITAGLIPTIN PHOS-METFORMIN HCL 50-1000 MG PO TABS: 1.0000 | ORAL_TABLET | Freq: Every day | ORAL | 1 refills | Status: DC

## 2019-09-25 DIAGNOSIS — H25813 Combined forms of age-related cataract, bilateral: Secondary | ICD-10-CM | POA: Diagnosis not present

## 2019-09-25 DIAGNOSIS — E119 Type 2 diabetes mellitus without complications: Secondary | ICD-10-CM | POA: Diagnosis not present

## 2019-09-25 LAB — HM DIABETES EYE EXAM

## 2019-10-12 HISTORY — PX: OTHER SURGICAL HISTORY: SHX169

## 2019-10-15 DIAGNOSIS — Z01818 Encounter for other preprocedural examination: Secondary | ICD-10-CM | POA: Diagnosis not present

## 2019-10-15 DIAGNOSIS — E119 Type 2 diabetes mellitus without complications: Secondary | ICD-10-CM | POA: Diagnosis not present

## 2019-10-15 DIAGNOSIS — H2511 Age-related nuclear cataract, right eye: Secondary | ICD-10-CM | POA: Diagnosis not present

## 2019-10-15 DIAGNOSIS — H25811 Combined forms of age-related cataract, right eye: Secondary | ICD-10-CM | POA: Diagnosis not present

## 2019-10-15 DIAGNOSIS — H2512 Age-related nuclear cataract, left eye: Secondary | ICD-10-CM | POA: Diagnosis not present

## 2019-10-29 ENCOUNTER — Other Ambulatory Visit: Payer: Self-pay

## 2019-10-29 MED ORDER — ATORVASTATIN CALCIUM 80 MG PO TABS: 80.0000 mg | ORAL_TABLET | Freq: Every day | ORAL | 0 refills | Status: DC

## 2019-11-06 ENCOUNTER — Ambulatory Visit: Payer: Medicare HMO | Admitting: Family Medicine

## 2019-11-12 LAB — HM DIABETES EYE EXAM

## 2019-11-24 ENCOUNTER — Other Ambulatory Visit: Payer: Self-pay | Admitting: Family Medicine

## 2019-11-26 ENCOUNTER — Encounter: Payer: Self-pay | Admitting: Family Medicine

## 2019-11-26 NOTE — Progress Notes (Signed)
New Patient Office Visit  Subjective:  Patient ID: Joshua Nelson, male    DOB: 11-Mar-1951  Age: 69 y.o. MRN: [id number]  CC:  Chief Complaint  Patient presents with  . Hyperlipidemia  . Diabetes  . Hypertension    HPI Pt presents with hyperlipidemia.  Date of diagnosis 06/2010.  Current treatment includes Lipitor, but is notfollowing  low cholesterol/low fat diet.  He maintains his exercise regimen.  He denies experiencing any hypercholesterolemia related symptoms. Concurrent health problems include diabetes.      Dx with type 2 diabetes mellitus with diabetic polyneuropathy; specifically, this is type 2, non-insulin requiring diabetes, complicated by peripheral neuropathy and coronary artery disease.  Date of diagnosis 08/17/2013.  Compliance with treatment has been good; he takes his medication as directed, does not maintain a healthy diet and exercise regimen, follows up as directed, and is keeping a glucose diary.  Primary symptoms reported include nerve dysfunction (manifested as numbness Feet ).  Depression screen is performed and is negative.  Tobacco screen: past smoker.  Current meds include an oral hypoglycemic ( Glucotrol ( 10mg  bid ) and Janumet XR 50/1000 mg QD ), Lantus 60 units QHS,  Prinivil for renoprotection, daily aspirin ( 81mg  ), lipid lowering agent ( Lipitor ), and Gabapentin 300 mg TID for peripheral neuropathy.  His wife stopped novolog 2 weeks ago. He reports home blood glucose readings have been good, with average fasting readings unknown because his wife checks it.  He has had some low sugars.   In regard to preventative care, his last ophthalmology exam was in 08/2019 (saw [facility]) -had a right cataract removal.  Concurrent health problems include hypercholesterolemia and CAD.      Atherosclerotic heart disease of native coronary artery without angina pectoris details; he is here today for routine follow-up.  Joshua Nelson has a prior history of a myocardial infarction  and is currently on a beta blocker.  His heart disease was first diagnosed 4 years ago.  The course of the disease has been stable.  Currently, his treatment regimen consists of daily 81 mg aspirin, an ACEI ( lisinopril ),  Lipitor, and a nitrate ( p.r.n. sublingual nitroglycerin ).  No associated symptoms are reported.  Current level of activity includes ability to walk and perform moderate exercise.  Depression screen is performed and is negative.  Joshua Nelson is compliant with tobacco avoidance, low salt diet, having fresh nitroglycerin on hand, and taking medications as recommended and prescribed.  To evaluate for coronary artery disease, the patient has had prior exercise stress test (reported performed 06/25/2014 ).  A cardiac cath has been done ( performed 06/2010 ).  He denies any adverse medication effects.  Medical history is pertinent for co-existant diabetes and hyperlipidemia.  Due for a cardiac follow up. Previously seen by Joshua Nelson.  Patient has frozen shoulder. He had a fall last summer and has had persistent pain.  He has not had steroid shots. Due to return to orthopedics.  Past Medical History:  Diagnosis Date  . Coronary artery disease   . CVA (cerebral vascular accident) ([place])   . Diabetes mellitus without complication ([place])   . Hypertension   . Insomnia   . Myocardial infarct [facility])     Past Surgical History:  Procedure Laterality Date  . heart stents     x 3  . right cataract Right 08/2019    Family History  Problem Relation Age of Onset  . Endometrial cancer Mother   .  CAD Mother   . AAA (abdominal aortic aneurysm) Mother   . Healthy Father   . Heart attack Brother     Social History   Socioeconomic History  . Marital status: Married    Spouse name: Not on Nelson  . Number of children: 1  . Years of education: 13  . Highest education level: High school graduate  Occupational History  . Occupation: Retired  Tobacco Use  . Smoking status: Former Smoker     Quit date: 2003    Years since quitting: 18.3  . Smokeless tobacco: Never Used  Substance and Sexual Activity  . Alcohol use: No  . Drug use: Never  . Sexual activity: Not on Nelson  Other Topics Concern  . Not on Nelson  Social History Narrative   Lives at home with his wife.   2 cups caffeine per day.   Right-handed.   Social Determinants of Health   Financial Resource Strain:   . Difficulty of Paying Living Expenses:   Food Insecurity:   . Worried About Charity fundraiser in the Last Year:   . Arboriculturist in the Last Year:   Transportation Needs:   . Film/video editor (Medical):   Marland Kitchen Lack of Transportation (Non-Medical):   Physical Activity:   . Days of Exercise per Week:   . Minutes of Exercise per Session:   Stress:   . Feeling of Stress :   Social Connections:   . Frequency of Communication with Friends and Family:   . Frequency of Social Gatherings with Friends and Family:   . Attends Religious Services:   . Active Member of Clubs or Organizations:   . Attends Archivist Meetings:   Marland Kitchen Marital Status:   Intimate Partner Violence:   . Fear of Current or Ex-Partner:   . Emotionally Abused:   Marland Kitchen Physically Abused:   . Sexually Abused:     ROS Review of Systems  Constitutional: Negative for chills, diaphoresis, fatigue and fever.  HENT: Negative for congestion, ear pain and sore throat.   Respiratory: Negative for cough and shortness of breath.   Cardiovascular: Negative for chest pain and leg swelling.  Gastrointestinal: Negative for abdominal pain, constipation, diarrhea, nausea and vomiting.  Endocrine: Positive for polydipsia and polyuria. Negative for polyphagia.  Genitourinary: Negative for dysuria and urgency.  Musculoskeletal: Negative for arthralgias and myalgias.  Neurological: Negative for dizziness and headaches.  Psychiatric/Behavioral: Negative for dysphoric mood.    Objective:   Today's Vitals: BP 128/72 (BP Location: Left Arm,  Patient Position: Sitting)   Pulse 75   Temp (!) 97.5 F (36.4 C) (Temporal)   Ht 5\' 10"  (1.778 m)   Wt 220 lb (99.8 kg)   SpO2 99%   BMI 31.57 kg/m   Physical Exam Constitutional:      Appearance: Normal appearance.  Neck:     Vascular: No carotid bruit.  Cardiovascular:     Rate and Rhythm: Normal rate and regular rhythm.     Pulses: Normal pulses.     Heart sounds: Normal heart sounds.  Pulmonary:     Effort: Pulmonary effort is normal.     Breath sounds: Normal breath sounds.  Abdominal:     General: Bowel sounds are normal.     Palpations: Abdomen is soft.     Tenderness: There is no abdominal tenderness.  Musculoskeletal:        General: Tenderness (anterior and posterior shoulder. ROM limited. Frozen shoulder. )  present.     Cervical back: Normal range of motion.     Right lower leg: No edema.     Left lower leg: No edema.  Skin:    General: Skin is warm and dry.  Neurological:     Mental Status: He is alert and oriented to person, place, and time.  Psychiatric:        Mood and Affect: Mood normal.        Behavior: Behavior normal.    Diabetic Foot Exam - Simple   Simple Foot Form Diabetic Foot exam was performed with the following findings: Yes 11/30/2019  9:41 AM  Visual Inspection See comments: Yes Sensation Testing See comments: Yes Pulse Check Posterior Tibialis and Dorsalis pulse intact bilaterally: Yes Comments Numbness in feet (and hands) - mild.  Horned great toenail on left foot.        Assessment & Plan:  1. Mixed hyperlipidemia Previously not at goal. Continue to work on eating a healthy diet and exercise.  Labs drawn today.  - Lipid panel  2. Controlled type 2 diabetes mellitus with complication, with long-term current use of insulin (HCC) Control: not at goal Recommend check sugars fasting daily. Recommend check feet daily. Recommend annual eye exams. Medicines: await labs. Continue to work on eating a healthy diet and  exercise.  Labs drawn today.   - Hemoglobin A1c  3. Hypertensive heart disease without heart failure Well controlled blood pressure. Overdue for cardiac follow up. Refer to Joshua Nelson. No changes to medicines.  Continue to work on eating a healthy diet and exercise.  Labs drawn today.  - Comprehensive metabolic panel - CBC with Differential/Platelet  4. BMI 31.0-31.9,adult Recommend eat healthy and exercise.  5. Chronic left shoulder pain Follow up with orthopedics.   Outpatient Encounter Medications as of 11/30/2019  Medication Sig  . zolpidem (AMBIEN) 10 MG tablet   . aspirin EC 325 MG tablet Take 650 mg by mouth.   Marland Kitchen atorvastatin (LIPITOR) 80 MG tablet Take 1 tablet (80 mg total) by mouth daily.  . clopidogrel (PLAVIX) 75 MG tablet Take 1 tablet (75 mg total) by mouth daily.  Marland Kitchen ezetimibe (ZETIA) 10 MG tablet   . gabapentin (NEURONTIN) 300 MG capsule TAKE ONE (1) CAPSULE THREE (3) TIMES EACH DAY  . glimepiride (AMARYL) 4 MG tablet TAKE 1 TABLET BY MOUTH EVERY MORNING  . glipiZIDE (GLUCOTROL XL) 10 MG 24 hr tablet   . Insulin Glargine (LANTUS SOLOSTAR) 100 UNIT/ML Solostar Pen INJECT 20 UNITS BY SUBCUTANEOUS ROUTE ONCE DAILY IN THE EVENINGS  . JANUVIA 100 MG tablet   . lisinopril (PRINIVIL,ZESTRIL) 2.5 MG tablet Take 2.5 mg by mouth daily.  . metFORMIN (GLUCOPHAGE) 1000 MG tablet   . metoprolol succinate (TOPROL-XL) 25 MG 24 hr tablet   . ofloxacin (OCUFLOX) 0.3 % ophthalmic solution   . sitaGLIPtin-metformin (JANUMET) 50-1000 MG tablet Take 1 tablet by mouth daily.   No facility-administered encounter medications on Nelson as of 11/30/2019.    Follow-up: Return in about 3 months (around 02/29/2020) for fasting nurse visit. schedule visit in afternoon 2 days later so his wife may come.[name], MD

## 2019-11-30 ENCOUNTER — Ambulatory Visit (INDEPENDENT_AMBULATORY_CARE_PROVIDER_SITE_OTHER): Payer: Medicare HMO | Admitting: Family Medicine

## 2019-11-30 ENCOUNTER — Encounter: Payer: Self-pay | Admitting: Family Medicine

## 2019-11-30 ENCOUNTER — Other Ambulatory Visit: Payer: Self-pay

## 2019-11-30 VITALS — BP 128/72 | HR 75 | Temp 97.5°F | Ht 70.0 in | Wt 220.0 lb

## 2019-11-30 DIAGNOSIS — E118 Type 2 diabetes mellitus with unspecified complications: Secondary | ICD-10-CM

## 2019-11-30 DIAGNOSIS — L602 Onychogryphosis: Secondary | ICD-10-CM

## 2019-11-30 DIAGNOSIS — Z6831 Body mass index (BMI) 31.0-31.9, adult: Secondary | ICD-10-CM

## 2019-11-30 DIAGNOSIS — I119 Hypertensive heart disease without heart failure: Secondary | ICD-10-CM

## 2019-11-30 DIAGNOSIS — G8929 Other chronic pain: Secondary | ICD-10-CM

## 2019-11-30 DIAGNOSIS — Z794 Long term (current) use of insulin: Secondary | ICD-10-CM | POA: Diagnosis not present

## 2019-11-30 DIAGNOSIS — E1142 Type 2 diabetes mellitus with diabetic polyneuropathy: Secondary | ICD-10-CM | POA: Insufficient documentation

## 2019-11-30 DIAGNOSIS — E782 Mixed hyperlipidemia: Secondary | ICD-10-CM | POA: Insufficient documentation

## 2019-11-30 DIAGNOSIS — M25512 Pain in left shoulder: Secondary | ICD-10-CM | POA: Diagnosis not present

## 2019-11-30 HISTORY — DX: Onychogryphosis: L60.2

## 2019-11-30 LAB — POCT UA - MICROALBUMIN: Microalbumin Ur, POC: 10 mg/L

## 2019-11-30 NOTE — Patient Instructions (Signed)
Recommend patient work on eating healthier! Continue exercise. Patient to let us know if his wife stopped NovoLog due to low sugars or if she ran out. Recommend sugar log so when she comes to the office I can see them. Refer to cardiology for routine follow-up. Refer to podiatry for nail care Refer to in-house pharmacist, Lysle Rubens, PhD.  She will provide help with  organizing medications, med list reviewed, patient assistance.  Important to have patient's wife!

## 2019-12-01 ENCOUNTER — Telehealth: Payer: Self-pay | Admitting: Family Medicine

## 2019-12-01 LAB — CBC WITH DIFFERENTIAL/PLATELET
Basophils Absolute: 0.1 10*3/uL (ref 0.0–0.2)
Basos: 1 %
EOS (ABSOLUTE): 0.4 10*3/uL (ref 0.0–0.4)
Eos: 4 %
Hematocrit: 39.8 % (ref 37.5–51.0)
Hemoglobin: 13.9 g/dL (ref 13.0–17.7)
Immature Grans (Abs): 0.1 10*3/uL (ref 0.0–0.1)
Immature Granulocytes: 1 %
Lymphocytes Absolute: 2.4 10*3/uL (ref 0.7–3.1)
Lymphs: 24 %
MCH: 30 pg (ref 26.6–33.0)
MCHC: 34.9 g/dL (ref 31.5–35.7)
MCV: 86 fL (ref 79–97)
Monocytes Absolute: 0.7 10*3/uL (ref 0.1–0.9)
Monocytes: 7 %
Neutrophils Absolute: 6.4 10*3/uL (ref 1.4–7.0)
Neutrophils: 63 %
Platelets: 196 10*3/uL (ref 150–450)
RBC: 4.63 x10E6/uL (ref 4.14–5.80)
RDW: 13.2 % (ref 11.6–15.4)
WBC: 10 10*3/uL (ref 3.4–10.8)

## 2019-12-01 LAB — LIPID PANEL
Chol/HDL Ratio: 4.1 ratio (ref 0.0–5.0)
Cholesterol, Total: 139 mg/dL (ref 100–199)
HDL: 34 mg/dL — ABNORMAL LOW (ref >39)
LDL Chol Calc (NIH): 86 mg/dL (ref 0–99)
Triglycerides: 101 mg/dL (ref 0–149)
VLDL Cholesterol Cal: 19 mg/dL (ref 5–40)

## 2019-12-01 LAB — HEMOGLOBIN A1C
Est. average glucose Bld gHb Est-mCnc: 186 mg/dL
Hgb A1c MFr Bld: 8.1 % — ABNORMAL HIGH (ref 4.8–5.6)

## 2019-12-01 LAB — COMPREHENSIVE METABOLIC PANEL
ALT: 23 IU/L (ref 0–44)
AST: 19 IU/L (ref 0–40)
Albumin/Globulin Ratio: 2 (ref 1.2–2.2)
Albumin: 4.7 g/dL (ref 3.8–4.8)
Alkaline Phosphatase: 65 IU/L (ref 39–117)
BUN/Creatinine Ratio: 11 (ref 10–24)
BUN: 8 mg/dL (ref 8–27)
Bilirubin Total: 0.6 mg/dL (ref 0.0–1.2)
CO2: 24 mmol/L (ref 20–29)
Calcium: 10 mg/dL (ref 8.6–10.2)
Chloride: 101 mmol/L (ref 96–106)
Creatinine, Ser: 0.75 mg/dL — ABNORMAL LOW (ref 0.76–1.27)
GFR calc Af Amer: 111 mL/min/{1.73_m2} (ref >59)
GFR calc non Af Amer: 96 mL/min/{1.73_m2} (ref >59)
Globulin, Total: 2.4 g/dL (ref 1.5–4.5)
Glucose: 117 mg/dL — ABNORMAL HIGH (ref 65–99)
Potassium: 4.7 mmol/L (ref 3.5–5.2)
Sodium: 140 mmol/L (ref 134–144)
Total Protein: 7.1 g/dL (ref 6.0–8.5)

## 2019-12-01 LAB — CARDIOVASCULAR RISK ASSESSMENT

## 2019-12-01 NOTE — Progress Notes (Signed)
  Chronic Care Management   Outreach Note  12/01/2019 Name: TEYTON NORBUT MRN: [id number] DOB: 11/07/1950  Referred by: [name], MD Reason for referral : No chief complaint on file.   An unsuccessful telephone outreach was attempted today. The patient was referred to the pharmacist for assistance with care management and care coordination.   Follow Up Plan:   Earney Hamburg Upstream Scheduler

## 2019-12-01 NOTE — Progress Notes (Signed)
  Chronic Care Management   Outreach Note  12/01/2019 Name: Joshua Nelson MRN: [id number] DOB: 03-Feb-1951  Referred by: [name], MD Reason for referral : No chief complaint on file.   An unsuccessful telephone outreach was attempted today. The patient was referred to the pharmacist for assistance with care management and care coordination.   Follow Up Plan:   Earney Hamburg Upstream Scheduler

## 2019-12-08 ENCOUNTER — Telehealth: Payer: Self-pay | Admitting: Family Medicine

## 2019-12-08 NOTE — Progress Notes (Signed)
°  Chronic Care Management   Outreach Note  12/08/2019 Name: Joshua Nelson MRN: [id number] DOB: March 20, 1951  Referred by: [name], MD Reason for referral : No chief complaint on file.   An unsuccessful telephone outreach was attempted today. The patient was referred to the pharmacist for assistance with care management and care coordination.   This note is not being shared with the patient for the following reason: To respect privacy (The patient or proxy has requested that the information not be shared).  Follow Up Plan:   Earney Hamburg Upstream Scheduler

## 2019-12-10 ENCOUNTER — Other Ambulatory Visit: Payer: Self-pay | Admitting: Family Medicine

## 2019-12-21 ENCOUNTER — Other Ambulatory Visit: Payer: Self-pay | Admitting: Family Medicine

## 2020-01-05 ENCOUNTER — Other Ambulatory Visit: Payer: Self-pay

## 2020-01-05 ENCOUNTER — Ambulatory Visit: Payer: Medicare HMO | Admitting: Podiatry

## 2020-01-05 DIAGNOSIS — E1169 Type 2 diabetes mellitus with other specified complication: Secondary | ICD-10-CM | POA: Diagnosis not present

## 2020-01-05 DIAGNOSIS — B351 Tinea unguium: Secondary | ICD-10-CM | POA: Diagnosis not present

## 2020-01-05 DIAGNOSIS — E1151 Type 2 diabetes mellitus with diabetic peripheral angiopathy without gangrene: Secondary | ICD-10-CM | POA: Diagnosis not present

## 2020-01-05 NOTE — Progress Notes (Signed)
  Subjective:  Patient ID: Joshua Nelson, male    DOB: Oct 19, 1950,  MRN: [id number]  Chief Complaint  Patient presents with  . 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 Diabetes    FBS; 230 A1c; 7 PCP; Cox x couple mo   69 y.o. male presents with the above complaint. History confirmed with patient.   Objective:  Physical Exam: warm, good capillary refill, nail exam onychomycosis of the toenails, no trophic changes or ulcerative lesions. DP pulses palpable, PT pulses palpable and protective sensation absent Left Foot: normal exam, no swelling, tenderness, instability; ligaments intact, full range of motion of all ankle/foot joints  Right Foot: normal exam, no swelling, tenderness, instability; ligaments intact, full range of motion of all ankle/foot joints   No images are attached to the encounter.  Assessment:   1. Onychomycosis of multiple toenails with type 2 diabetes mellitus and peripheral angiopathy ([place])    Plan:  Patient was evaluated and treated and all questions answered.  Onychomycosis, Diabetes and DPN  -DM foot exam performed. -Patient is diabetic with a qualifying condition for at risk foot care.  Procedure: Nail Debridement Rationale: Patient meets criteria for routine foot care due to DPN Type of Debridement: manual, sharp debridement. Instrumentation: Nail nipper, rotary burr. Number of Nails: 10  No follow-ups on file.

## 2020-01-20 ENCOUNTER — Telehealth: Payer: Self-pay | Admitting: Family Medicine

## 2020-01-20 NOTE — Progress Notes (Signed)
  Chronic Care Management   Note  01/20/2020 Name: BREK REECE MRN: [id number] DOB: [birth year]  AZAZEL FRANZE is a 69 y.o. year old male who is a primary care patient of Cox, Kirsten, MD. I reached out to Matthias Hughs by phone today in response to a referral sent by Mr. Toya Smothers Fosnaugh's PCP, Cox, Kirsten, MD.   Mr. Arzuaga was given information about Chronic Care Management services today including:  1. CCM service includes personalized support from designated clinical staff supervised by his physician, including individualized plan of care and coordination with other care providers 2. 24/7 contact phone numbers for assistance for urgent and routine care needs. 3. Service will only be billed when office clinical staff spend 20 minutes or more in a month to coordinate care. 4. Only one practitioner may furnish and bill the service in a calendar month. 5. The patient may stop CCM services at any time (effective at the end of the month) by phone call to the office staff.   Patient agreed to services and verbal consent obtained.   This note is not being shared with the patient for the following reason: To respect privacy (The patient or proxy has requested that the information not be shared).  Follow up plan:   Earney Hamburg Upstream Scheduler

## 2020-01-29 ENCOUNTER — Other Ambulatory Visit: Payer: Self-pay | Admitting: Family Medicine

## 2020-02-09 ENCOUNTER — Other Ambulatory Visit: Payer: Self-pay | Admitting: Physician Assistant

## 2020-02-22 ENCOUNTER — Other Ambulatory Visit: Payer: Self-pay | Admitting: Family Medicine

## 2020-03-01 ENCOUNTER — Other Ambulatory Visit: Payer: Medicare HMO

## 2020-03-01 ENCOUNTER — Other Ambulatory Visit: Payer: Self-pay

## 2020-03-01 DIAGNOSIS — E118 Type 2 diabetes mellitus with unspecified complications: Secondary | ICD-10-CM | POA: Diagnosis not present

## 2020-03-01 DIAGNOSIS — Z794 Long term (current) use of insulin: Secondary | ICD-10-CM | POA: Diagnosis not present

## 2020-03-01 DIAGNOSIS — I119 Hypertensive heart disease without heart failure: Secondary | ICD-10-CM

## 2020-03-02 LAB — CBC WITH DIFFERENTIAL/PLATELET
Basophils Absolute: 0.1 10*3/uL (ref 0.0–0.2)
Basos: 1 %
EOS (ABSOLUTE): 0.4 10*3/uL (ref 0.0–0.4)
Eos: 5 %
Hematocrit: 38 % (ref 37.5–51.0)
Hemoglobin: 12.8 g/dL — ABNORMAL LOW (ref 13.0–17.7)
Immature Grans (Abs): 0.1 10*3/uL (ref 0.0–0.1)
Immature Granulocytes: 1 %
Lymphocytes Absolute: 2.2 10*3/uL (ref 0.7–3.1)
Lymphs: 27 %
MCH: 29.9 pg (ref 26.6–33.0)
MCHC: 33.7 g/dL (ref 31.5–35.7)
MCV: 89 fL (ref 79–97)
Monocytes Absolute: 0.6 10*3/uL (ref 0.1–0.9)
Monocytes: 8 %
Neutrophils Absolute: 4.8 10*3/uL (ref 1.4–7.0)
Neutrophils: 58 %
Platelets: 172 10*3/uL (ref 150–450)
RBC: 4.28 x10E6/uL (ref 4.14–5.80)
RDW: 13.1 % (ref 11.6–15.4)
WBC: 8.1 10*3/uL (ref 3.4–10.8)

## 2020-03-02 LAB — HEMOGLOBIN A1C
Est. average glucose Bld gHb Est-mCnc: 203 mg/dL
Hgb A1c MFr Bld: 8.7 % — ABNORMAL HIGH (ref 4.8–5.6)

## 2020-03-02 LAB — COMPREHENSIVE METABOLIC PANEL
ALT: 20 IU/L (ref 0–44)
AST: 19 IU/L (ref 0–40)
Albumin/Globulin Ratio: 2 (ref 1.2–2.2)
Albumin: 4.5 g/dL (ref 3.8–4.8)
Alkaline Phosphatase: 60 IU/L (ref 48–121)
BUN/Creatinine Ratio: 14 (ref 10–24)
BUN: 11 mg/dL (ref 8–27)
Bilirubin Total: 0.4 mg/dL (ref 0.0–1.2)
CO2: 25 mmol/L (ref 20–29)
Calcium: 9.8 mg/dL (ref 8.6–10.2)
Chloride: 102 mmol/L (ref 96–106)
Creatinine, Ser: 0.8 mg/dL (ref 0.76–1.27)
GFR calc Af Amer: 108 mL/min/{1.73_m2} (ref >59)
GFR calc non Af Amer: 93 mL/min/{1.73_m2} (ref >59)
Globulin, Total: 2.2 g/dL (ref 1.5–4.5)
Glucose: 139 mg/dL — ABNORMAL HIGH (ref 65–99)
Potassium: 4.8 mmol/L (ref 3.5–5.2)
Sodium: 140 mmol/L (ref 134–144)
Total Protein: 6.7 g/dL (ref 6.0–8.5)

## 2020-03-02 LAB — CARDIOVASCULAR RISK ASSESSMENT

## 2020-03-02 LAB — LIPID PANEL
Chol/HDL Ratio: 4.8 ratio (ref 0.0–5.0)
Cholesterol, Total: 150 mg/dL (ref 100–199)
HDL: 31 mg/dL — ABNORMAL LOW (ref >39)
LDL Chol Calc (NIH): 94 mg/dL (ref 0–99)
Triglycerides: 138 mg/dL (ref 0–149)
VLDL Cholesterol Cal: 25 mg/dL (ref 5–40)

## 2020-03-02 NOTE — Progress Notes (Signed)
Minimal anemia, glucose 139, kidney tests normal, liver tests normal, A1c 8.7- high need to stay on diet , recommend increase insulin to 65 units a day, Cholesterol normal lp

## 2020-03-03 ENCOUNTER — Ambulatory Visit: Payer: Medicare HMO | Admitting: Family Medicine

## 2020-03-13 NOTE — Chronic Care Management (AMB) (Deleted)
Chronic Care Management Pharmacy  Name: Joshua Nelson  MRN: [id number] DOB: 26-Oct-1950  Chief Complaint/ HPI  Joshua Nelson,  69 y.o. , male presents for their Initial CCM visit with the clinical pharmacist {CHL HP Upstream Pharm visit NUUV:[id number]}.  PCP : [name], MD  Their chronic conditions include: hx of CVA, Hypertensive heart disease, OSA, diabetes, hyperlipidemia, chronic left shoulder pain.   Office Visits:*** 03/01/2020 - Minimal anemia, glucose 139, kidney tests normal, liver tests normal, A1c 8.7- high need to stay on diet , recommend increase insulin to 65 units a day, Cholesterol normal 11/30/2019 - refer to Dr. Marijo File due to overdue on cardiac follow up. Follow-up with orthopedics for shoulder pain.  Consult Visit:*** 01/05/2020 - Podiatry foot care.  10/15/2019 - Catartact procedure.  09/25/2019 Jefm Bryant eye exam.  Medications: Outpatient Encounter Medications as of 03/14/2020  Medication Sig   aspirin EC 325 MG tablet Take 650 mg by mouth.    atorvastatin (LIPITOR) 80 MG tablet TAKE ONE (1) TABLET ONCE DAILY   ezetimibe (ZETIA) 10 MG tablet TAKE ONE (1) TABLET ONCE DAILY   gabapentin (NEURONTIN) 300 MG capsule TAKE ONE (1) CAPSULE THREE (3) TIMES EACH DAY   glimepiride (AMARYL) 4 MG tablet TAKE 1 TABLET BY MOUTH EVERY MORNING   glipiZIDE (GLUCOTROL XL) 10 MG 24 hr tablet TAKE ONE TABLET TWICE DAILY   JANUVIA 100 MG tablet    LANTUS SOLOSTAR 100 UNIT/ML Solostar Pen INJECT 60 UNITS ONCE DAILY IN THE EVENING.   lisinopril (ZESTRIL) 2.5 MG tablet TAKE ONE (1) TABLET ONCE DAILY   metFORMIN (GLUCOPHAGE) 1000 MG tablet    metoprolol succinate (TOPROL-XL) 25 MG 24 hr tablet TAKE ONE (1) TABLET ONCE DAILY   ofloxacin (OCUFLOX) 0.3 % ophthalmic solution    sitaGLIPtin-metformin (JANUMET) 50-1000 MG tablet Take 1 tablet by mouth daily.   zolpidem (AMBIEN) 10 MG tablet    No facility-administered encounter medications on file as of 03/14/2020.    No Known Allergies  SDOH Screenings   Alcohol Screen:    Last Alcohol Screening Score (AUDIT):   Depression (PHQ2-9): Low Risk    PHQ-2 Score: 0  Financial Resource Strain:    Difficulty of Paying Living Expenses:   Food Insecurity:    Worried About Charity fundraiser in the Last Year:    Arboriculturist in the Last Year:   Housing:    Last Housing Risk Score:   Physical Activity:    Days of Exercise per Week:    Minutes of Exercise per Session:   Social Connections:    Frequency of Communication with Friends and Family:    Frequency of Social Gatherings with Friends and Family:    Attends Religious Services:    Active Member of Clubs or Organizations:    Attends Music therapist:    Marital Status:   Stress:    Feeling of Stress :   Tobacco Use: Medium Risk   Smoking Tobacco Use: Former Smoker   Smokeless Tobacco Use: Never Used  Transportation Needs:    Film/video editor (Medical):    Lack of Transportation (Non-Medical):      Current Diagnosis/Assessment:  Goals Addressed   None     Diabetes   Recent Relevant Labs: Lab Results  Component Value Date/Time   HGBA1C 8.7 (H) 03/01/2020 07:44 AM   HGBA1C 8.1 (H) 11/30/2019 09:51 AM   MICROALBUR 10 11/30/2019 09:50 AM     Checking BG: {CHL HP  Blood Glucose Monitoring Frequency:[phone]}  Recent FBG Readings: Recent pre-meal BG readings: *** Recent 2hr PP BG readings:  *** Recent HS BG readings: *** Patient has failed these meds in past: *** Patient is currently {CHL Controlled/[phone]} on the following medications:   Glimepiride 4 mg daily in the morning  Glipizide xl 10 mg twice daily  Januvia 100 mg ***  Lantus solostar 65*** units once daily in the evening  Janumet 50-1000 mg daily  Last diabetic Foot exam: No results found for: HMDIABEYEEXA  Last diabetic Eye exam: No results found for: HMDIABFOOTEX   We discussed: {CHL HP Upstream  Pharmacy [phone]}  Plan  Continue {CHL HP Upstream Pharmacy Plans:[phone]} and   Hypertension   BP today is:  {CHL HP UPSTREAM Pharmacist BP ranges:[phone]}  Office blood pressures are  BP Readings from Last 3 Encounters:  11/30/19 128/72  04/03/18 124/73  12/07/14 144/81    Patient has failed these meds in the past: *** Patient is currently controlled/uncontrolled*** on the following medications: ***  Metoprolol succinate 25 mg daily  Lisinopril 2.5 mg daily  Patient checks BP at home {CHL HP BP Monitoring [phone]}  Patient home BP readings are ranging: ***  We discussed {CHL HP Upstream Pharmacy [phone]}  Plan  Continue {CHL HP Upstream Pharmacy Plans:[phone]}     Hyperlipidemia   LDL goal < ***  Lipid Panel     Component Value Date/Time   CHOL 150 03/01/2020 0744   TRIG 138 03/01/2020 0744   HDL 31 (L) 03/01/2020 0744   LDLCALC 94 03/01/2020 0744    Hepatic Function Latest Ref Rng & Units 03/01/2020 11/30/2019  Total Protein 6.0 - 8.5 g/dL 6.7 7.1  Albumin 3.8 - 4.8 g/dL 4.5 4.7  AST 0 - 40 IU/L 19 19  ALT 0 - 44 IU/L 20 23  Alk Phosphatase 48 - 121 IU/L 60 65  Total Bilirubin 0.0 - 1.2 mg/dL 0.4 0.6     The ASCVD Risk score Mikey Bussing DC Jr., et al., 2013) failed to calculate for the following reasons:   The patient has a prior MI or stroke diagnosis   Patient has failed these meds in past: *** Patient is currently {CHL Controlled/[phone]} on the following medications:   Aspirin ec 650 mg ***  Atorvastatin 80 mg daily  Ezetimibe 10 mg daily  We discussed:  {CHL HP Upstream Pharmacy [phone]}  Plan  Continue {CHL HP Upstream Pharmacy Plans:[phone]}   ***Insomnia   Patient has failed these meds in past: *** Patient is currently {CHL Controlled/[phone]} on the following medications:   Zolpidem 10 mg ***  We discussed:  ***  Plan  Continue {CHL  HP Upstream Pharmacy Plans:[phone]}   Health Maintenance   Patient is currently {CHL Controlled/[phone]} on the following medications:   Gabapentin 300 mg tid ***  We discussed:  ***  Plan  Continue {CHL HP Upstream Pharmacy EXBMW:[id number]}  Vaccines   Reviewed and discussed patient's vaccination history.     There is no immunization history on file for this patient.  Plan  Recommended patient receive *** vaccine in *** office.   Medication Management   Pt uses [facility] for all medications Uses pill box? {Yes or If no, why not?:20788} Pt endorses ***% compliance  We discussed: ***  Plan  {US Pharmacy VOZD:66440}    Follow up: *** month phone visit  ***

## 2020-03-14 ENCOUNTER — Telehealth: Payer: Medicare HMO

## 2020-03-16 ENCOUNTER — Other Ambulatory Visit: Payer: Self-pay | Admitting: Physician Assistant

## 2020-03-17 ENCOUNTER — Other Ambulatory Visit: Payer: Self-pay | Admitting: Family Medicine

## 2020-03-21 NOTE — Chronic Care Management (AMB) (Signed)
Chronic Care Management Pharmacy  Name: ERCIL CASSIS  MRN: [id number] DOB: Dec 10, 1950  Chief Complaint/ HPI  Joshua Nelson,  69 y.o. , male presents for their Initial CCM visit with the clinical pharmacist via telephone due to COVID-19 Pandemic.  PCP : [name], MD  Their chronic conditions include: hx of CVA, Hypertensive heart disease, OSA, diabetes, hyperlipidemia, chronic left shoulder pain.   Office Visits: 03/01/2020 - Minimal anemia, glucose 139, kidney tests normal, liver tests normal, A1c 8.7- high need to stay on diet , recommend increase insulin to 65 units a day, Cholesterol normal 11/30/2019 - refer to Dr. Marijo File due to overdue on cardiac follow up. Follow-up with orthopedics for shoulder pain.  Consult Visit: 01/05/2020 - Podiatry foot care.  10/15/2019 - Catartact procedure.  09/25/2019 Jefm Bryant eye exam.  Medications: Outpatient Encounter Medications as of 03/22/2020  Medication Sig  . atorvastatin (LIPITOR) 80 MG tablet TAKE ONE (1) TABLET ONCE DAILY  . ezetimibe (ZETIA) 10 MG tablet TAKE ONE (1) TABLET ONCE DAILY  . glipiZIDE (GLUCOTROL XL) 10 MG 24 hr tablet TAKE ONE TABLET TWICE DAILY  . LANTUS SOLOSTAR 100 UNIT/ML Solostar Pen INJECT 60 UNITS ONCE DAILY IN THE EVENING. (Patient taking differently: Inject 65 Units into the skin daily. )  . lisinopril (ZESTRIL) 2.5 MG tablet TAKE ONE (1) TABLET ONCE DAILY  . metoprolol succinate (TOPROL-XL) 25 MG 24 hr tablet TAKE ONE (1) TABLET ONCE DAILY  . sitaGLIPtin-metformin (JANUMET) 50-1000 MG tablet Take 1 tablet by mouth daily.  Marland Kitchen zolpidem (AMBIEN) 10 MG tablet Take 10 mg by mouth at bedtime.   Marland Kitchen aspirin EC 325 MG tablet Take 650 mg by mouth.  (Patient not taking: Reported on 03/22/2020)  . gabapentin (NEURONTIN) 300 MG capsule TAKE ONE (1) CAPSULE THREE (3) TIMES EACH DAY  . glimepiride (AMARYL) 4 MG tablet TAKE 1 TABLET BY MOUTH EVERY MORNING (Patient not taking: Reported on 03/22/2020)  . JANUVIA 100 MG  tablet  (Patient not taking: Reported on 03/22/2020)  . metFORMIN (GLUCOPHAGE) 1000 MG tablet  (Patient not taking: Reported on 03/22/2020)  . ofloxacin (OCUFLOX) 0.3 % ophthalmic solution  (Patient not taking: Reported on 03/22/2020)   No facility-administered encounter medications on file as of 03/22/2020.   No Known Allergies  SDOH Screenings   Alcohol Screen:   . Last Alcohol Screening Score (AUDIT):   Depression (PHQ2-9): Low Risk   . PHQ-2 Score: 0  Financial Resource Strain:   . Difficulty of Paying Living Expenses:   Food Insecurity:   . Worried About Charity fundraiser in the Last Year:   . [place] in the Last Year:   Housing:   . Last Housing Risk Score:   Physical Activity:   . Days of Exercise per Week:   . Minutes of Exercise per Session:   Social Connections:   . Frequency of Communication with Friends and Family:   . Frequency of Social Gatherings with Friends and Family:   . Attends Religious Services:   . Active Member of Clubs or Organizations:   . Attends Archivist Meetings:   Marland Kitchen Marital Status:   Stress:   . Feeling of Stress :   Tobacco Use: Medium Risk  . Smoking Tobacco Use: Former Smoker  . Smokeless Tobacco Use: Never Used  Transportation Needs:   . Film/video editor (Medical):   Marland Kitchen Lack of Transportation (Non-Medical):     Current Diagnosis/Assessment:  Goals Addressed  This Visit's Progress   . Pharmacy Care Plan       CARE PLAN ENTRY (see longitudinal plan of care for additional care plan information)  Current Barriers:  . Chronic Disease Management support, education, and care coordination needs related to Hypertension, Hyperlipidemia, and Diabetes   Hypertension BP Readings from Last 3 Encounters:  11/30/19 128/72  04/03/18 124/73  12/07/14 144/81   . Pharmacist Clinical Goal(s): o Over the next 90 days, patient will work with PharmD and providers to maintain BP goal <130/80 . Current regimen:    o Metoprolol 25 mg daily o Lisinopril 2.5 mg daily  . Interventions: o Patient's blood pressure medication is affordable per wife.  o Patient uses pill box to keep medications on time.  . Patient self care activities - Over the next 90 days, patient will: o Check BP weekly, document, and provide at future appointments o Ensure daily salt intake < 2300 mg/day  Hyperlipidemia Lab Results  Component Value Date/Time   LDLCALC 94 03/01/2020 07:44 AM   . Pharmacist Clinical Goal(s): o Over the next 90 days, patient will work with PharmD and providers to achieve LDL goal <70 . Current regimen:  o Atorvastatin 80 mg daily o Ezetemibe 10 mg daily  . Interventions: o Patient's wife reports medications affordable. Mr. Jowett uses a pill box to take medications.  . Patient self care activities - Over the next 90 days, patient will: o Continue to take medication as prescribed.  o Contract provider or patient with any questions or concerns.   Diabetes Lab Results  Component Value Date/Time   HGBA1C 8.7 (H) 03/01/2020 07:44 AM   HGBA1C 8.1 (H) 11/30/2019 09:51 AM   . Pharmacist Clinical Goal(s): o Over the next 90 days, patient will work with PharmD and providers to achieve A1c goal <7% . Current regimen:  o Lantus 65 units daily o Janumet 50/1000 mg daily  o Glipizide XL 10 mg twice daily . Interventions: o Working to help patient get Lantus and Janumet through patient assistance. Patient was in the coverage gap last year and cost of Janumet is too high currently.  o Recommend patient begin checking blood sugar once daily every day.  . Patient self care activities - Over the next 90 days, patient will: o Check blood sugar once daily, document, and provide at future appointments o Contact provider with any episodes of hypoglycemia  Medication management . Pharmacist Clinical Goal(s): o Over the next 90 days, patient will work with PharmD and providers to achieve optimal medication  adherence . Current pharmacy: [facility] . Interventions o Comprehensive medication review performed. o Continue current medication management strategy . Patient self care activities - Over the next 90 days, patient will: o Focus on medication adherence by continuing to use pill box.  o Take medications as prescribed o Report any questions or concerns to PharmD and/or provider(s)  Initial goal documentation        Diabetes   Recent Relevant Labs: Lab Results  Component Value Date/Time   HGBA1C 8.7 (H) 03/01/2020 07:44 AM   HGBA1C 8.1 (H) 11/30/2019 09:51 AM   MICROALBUR 10 11/30/2019 09:50 AM   Kidney Function Lab Results  Component Value Date/Time   CREATININE 0.80 03/01/2020 07:44 AM   CREATININE 0.75 (L) 11/30/2019 09:51 AM   GFRNONAA 93 03/01/2020 07:44 AM   GFRAA 108 03/01/2020 07:44 AM   K 4.8 03/01/2020 07:44 AM   K 4.7 11/30/2019 09:51 AM      Checking  BG: Daily but not consistently  Recent FBG Readings: not checking good lately has had 400s  Patient is currently uncontrolled on the following medications:   Glimepiride 4 mg daily in the morning  Glipizide xl 10 mg twice daily  Lantus solostar 65 units once daily in the evening  Janumet 50-1000 mg daily  Last diabetic Foot exam: No results found for: HMDIABEYEEXA  Last diabetic Eye exam: No results found for: HMDIABFOOTEX   We discussed: Patient could not afford Janumet and blood sugars are increasing. Patient is not consistently checking. Wife reports that blood sugars are running high. Wife helps administer insulin but patient takes his tablets out of a pill box.   Plan  Continue current medications and   Hypertension   BP today is:  <130/80  Office blood pressures are  BP Readings from Last 3 Encounters:  11/30/19 128/72  04/03/18 124/73  12/07/14 144/81    Patient has failed these meds in the past: none reported  Patient is currently controlled on the following medications:    Metoprolol succinate 25 mg daily  Lisinopril 2.5 mg daily  Patient checks BP at home infrequently  Patient home BP readings are ranging: at goal in office   We discussed Patient's wife reports that blood pressure medications are affordable and patient takes out of pill box.   Plan  Continue current medications     Hyperlipidemia   LDL goal < 70  Lipid Panel     Component Value Date/Time   CHOL 150 03/01/2020 0744   TRIG 138 03/01/2020 0744   HDL 31 (L) 03/01/2020 0744   LDLCALC 94 03/01/2020 0744    Hepatic Function Latest Ref Rng & Units 03/01/2020 11/30/2019  Total Protein 6.0 - 8.5 g/dL 6.7 7.1  Albumin 3.8 - 4.8 g/dL 4.5 4.7  AST 0 - 40 IU/L 19 19  ALT 0 - 44 IU/L 20 23  Alk Phosphatase 48 - 121 IU/L 60 65  Total Bilirubin 0.0 - 1.2 mg/dL 0.4 0.6     The ASCVD Risk score Mikey Bussing DC Jr., et al., 2013) failed to calculate for the following reasons:   The patient has a prior MI or stroke diagnosis   Patient has failed these meds in past: aspirin Patient is currently uncontrolled on the following medications:  . Atorvastatin 80 mg daily . Ezetimibe 10 mg daily  We discussed:  Patient's wife is concerned that he still has some residual effects of stroke and may need referral to a different neurologist. Wants to discuss with Dr. Tobie Poet at visit tomorrow. Patient had stroke 2 years ago. Has dizziness and occasional face drawing up.   Plan  Continue current medications     Insomnia   Patient has failed these meds in past: none reported Patient is currently controlled on the following medications:  Marland Kitchen Zolpidem 10 mg at bedtime  We discussed:  Patient takes nightly per wife.   Plan  Continue current medications   Health Maintenance   Patient is currently controlled on the following medications:  . Gabapentin 300 mg tid -peripheral neuropathy  We discussed:  Patient and his wife are coming in tomorrow for visit with Dr. Tobie Poet to assess medications. We are  working together on affordability options for diabetes medications.   Plan  Continue current medications  Vaccines   Reviewed and discussed patient's vaccination history.     There is no immunization history on file for this patient.  Plan  Recommended patient receive annual flu vaccine  in office. Will evaluate additional vaccines at future visit.   Medication Management   Pt uses [facility] for all medications Uses pill box? Yes Pt endorses some missed doses and affordability issues.   We discussed: Patient takes medication out of pill box currently. Struggling to afford some of his more expensive medications. Will evaluate adherence and potential for pharmacy delivery/packaging at next call. Wife was not at home during call and did not have medications in front of her.   Plan  Continue current medication management strategy    Follow up: 1 month phone visit

## 2020-03-22 ENCOUNTER — Ambulatory Visit: Payer: Medicare HMO

## 2020-03-22 DIAGNOSIS — E782 Mixed hyperlipidemia: Secondary | ICD-10-CM

## 2020-03-22 DIAGNOSIS — E118 Type 2 diabetes mellitus with unspecified complications: Secondary | ICD-10-CM

## 2020-03-22 DIAGNOSIS — Z794 Long term (current) use of insulin: Secondary | ICD-10-CM

## 2020-03-22 NOTE — Patient Instructions (Addendum)
Visit Information  Thank you for your time discussing your medications. I look forward to working with you to achieve your health care goals. Below is a summary of what we talked about during our visit.   Goals Addressed            This Visit's Progress   . Pharmacy Care Plan       CARE PLAN ENTRY (see longitudinal plan of care for additional care plan information)  Current Barriers:  . Chronic Disease Management support, education, and care coordination needs related to Hypertension, Hyperlipidemia, and Diabetes   Hypertension BP Readings from Last 3 Encounters:  11/30/19 128/72  04/03/18 124/73  12/07/14 144/81   . Pharmacist Clinical Goal(s): o Over the next 90 days, patient will work with PharmD and providers to maintain BP goal <130/80 . Current regimen:  o Metoprolol 25 mg daily o Lisinopril 2.5 mg daily  . Interventions: o Patient's blood pressure medication is affordable per wife.  o Patient uses pill box to keep medications on time.  . Patient self care activities - Over the next 90 days, patient will: o Check BP weekly, document, and provide at future appointments o Ensure daily salt intake < 2300 mg/day  Hyperlipidemia Lab Results  Component Value Date/Time   LDLCALC 94 03/01/2020 07:44 AM   . Pharmacist Clinical Goal(s): o Over the next 90 days, patient will work with PharmD and providers to achieve LDL goal <70 . Current regimen:  o Atorvastatin 80 mg daily o Ezetemibe 10 mg daily  . Interventions: o Patient's wife reports medications affordable. Joshua Nelson uses a pill box to take medications.  . Patient self care activities - Over the next 90 days, patient will: o Continue to take medication as prescribed.  o Contract provider or patient with any questions or concerns.   Diabetes Lab Results  Component Value Date/Time   HGBA1C 8.7 (H) 03/01/2020 07:44 AM   HGBA1C 8.1 (H) 11/30/2019 09:51 AM   . Pharmacist Clinical Goal(s): o Over the next 90  days, patient will work with PharmD and providers to achieve A1c goal <7% . Current regimen:  o Lantus 65 units daily o Janumet 50/1000 mg daily  o Glipizide XL 10 mg twice daily . Interventions: o Working to help patient get Lantus and Janumet through patient assistance. Patient was in the coverage gap last year and cost of Janumet is too high currently.  o Recommend patient begin checking blood sugar once daily every day.  . Patient self care activities - Over the next 90 days, patient will: o Check blood sugar once daily, document, and provide at future appointments o Contact provider with any episodes of hypoglycemia  Medication management . Pharmacist Clinical Goal(s): o Over the next 90 days, patient will work with PharmD and providers to achieve optimal medication adherence . Current pharmacy: [facility] . Interventions o Comprehensive medication review performed. o Continue current medication management strategy . Patient self care activities - Over the next 90 days, patient will: o Focus on medication adherence by continuing to use pill box.  o Take medications as prescribed o Report any questions or concerns to PharmD and/or provider(s)  Initial goal documentation        Joshua Nelson was given information about Chronic Care Management services today including:  1. CCM service includes personalized support from designated clinical staff supervised by his physician, including individualized plan of care and coordination with other care providers 2. 24/7 contact phone numbers for assistance  for urgent and routine care needs. 3. Standard insurance, coinsurance, copays and deductibles apply for chronic care management only during months in which we provide at least 20 minutes of these services. Most insurances cover these services at 100%, however patients may be responsible for any copay, coinsurance and/or deductible if applicable. This service may help you avoid the need for  more expensive face-to-face services. 4. Only one practitioner may furnish and bill the service in a calendar month. 5. The patient may stop CCM services at any time (effective at the end of the month) by phone call to the office staff.  Patient agreed to services and verbal consent obtained.   The patient verbalized understanding of instructions provided today and agreed to receive a mailed copy of patient instruction and/or educational materials. Telephone follow up appointment with pharmacy team member scheduled for: 04/2020  Sherre Poot, PharmD Clinical Pharmacist [facility] [phone] (office) [phone] (mobile)      DASH Eating Plan DASH stands for "Dietary Approaches to Stop Hypertension." The DASH eating plan is a healthy eating plan that has been shown to reduce high blood pressure (hypertension). It may also reduce your risk for type 2 diabetes, heart disease, and stroke. The DASH eating plan may also help with weight loss. What are tips for following this plan?  General guidelines  Avoid eating more than 2,300 mg (milligrams) of salt (sodium) a day. If you have hypertension, you may need to reduce your sodium intake to 1,500 mg a day.  Limit alcohol intake to no more than 1 drink a day for nonpregnant women and 2 drinks a day for men. One drink equals 12 oz of beer, 5 oz of wine, or 1 oz of hard liquor.  Work with your health care provider to maintain a healthy body weight or to lose weight. Ask what an ideal weight is for you.  Get at least 30 minutes of exercise that causes your heart to beat faster (aerobic exercise) most days of the week. Activities may include walking, swimming, or biking.  Work with your health care provider or diet and nutrition specialist (dietitian) to adjust your eating plan to your individual calorie needs. Reading food labels   Check food labels for the amount of sodium per serving. Choose foods with less than 5 percent  of the Daily Value of sodium. Generally, foods with less than 300 mg of sodium per serving fit into this eating plan.  To find whole grains, look for the word "whole" as the first word in the ingredient list. Shopping  Buy products labeled as "low-sodium" or "no salt added."  Buy fresh foods. Avoid canned foods and premade or frozen meals. Cooking  Avoid adding salt when cooking. Use salt-free seasonings or herbs instead of table salt or sea salt. Check with your health care provider or pharmacist before using salt substitutes.  Do not fry foods. Cook foods using healthy methods such as baking, boiling, grilling, and broiling instead.  Cook with heart-healthy oils, such as olive, canola, soybean, or sunflower oil. Meal planning  Eat a balanced diet that includes: ? 5 or more servings of fruits and vegetables each day. At each meal, try to fill half of your plate with fruits and vegetables. ? Up to 6-8 servings of whole grains each day. ? Less than 6 oz of lean meat, poultry, or fish each day. A 3-oz serving of meat is about the same size as a deck of cards. One egg equals 1  oz. ? 2 servings of low-fat dairy each day. ? A serving of nuts, seeds, or beans 5 times each week. ? Heart-healthy fats. Healthy fats called Omega-3 fatty acids are found in foods such as flaxseeds and coldwater fish, like sardines, salmon, and mackerel.  Limit how much you eat of the following: ? Canned or prepackaged foods. ? Food that is high in trans fat, such as fried foods. ? Food that is high in saturated fat, such as fatty meat. ? Sweets, desserts, sugary drinks, and other foods with added sugar. ? Full-fat dairy products.  Do not salt foods before eating.  Try to eat at least 2 vegetarian meals each week.  Eat more home-cooked food and less restaurant, buffet, and fast food.  When eating at a restaurant, ask that your food be prepared with less salt or no salt, if possible. What foods are  recommended? The items listed may not be a complete list. Talk with your dietitian about what dietary choices are best for you. Grains Whole-grain or whole-wheat bread. Whole-grain or whole-wheat pasta. Armani Brar rice. [place]ena Morrow. Bulgur. Whole-grain and low-sodium cereals. Pita bread. Low-fat, low-sodium crackers. Whole-wheat flour tortillas. Vegetables Fresh or frozen vegetables (raw, steamed, roasted, or grilled). Low-sodium or reduced-sodium tomato and vegetable juice. Low-sodium or reduced-sodium tomato sauce and tomato paste. Low-sodium or reduced-sodium canned vegetables. Fruits All fresh, dried, or frozen fruit. Canned fruit in natural juice (without added sugar). Meat and other protein foods Skinless chicken or [place]. Ground chicken or [place]. Pork with fat trimmed off. Fish and seafood. Egg whites. Dried beans, peas, or lentils. Unsalted nuts, nut butters, and seeds. Unsalted canned beans. Lean cuts of beef with fat trimmed off. Low-sodium, lean deli meat. Dairy Low-fat (1%) or fat-free (skim) milk. Fat-free, low-fat, or reduced-fat cheeses. Nonfat, low-sodium ricotta or cottage cheese. Low-fat or nonfat yogurt. Low-fat, low-sodium cheese. Fats and oils Soft margarine without trans fats. Vegetable oil. Low-fat, reduced-fat, or light mayonnaise and salad dressings (reduced-sodium). Canola, safflower, olive, soybean, and sunflower oils. Avocado. Seasoning and other foods Herbs. Spices. Seasoning mixes without salt. Unsalted popcorn and pretzels. Fat-free sweets. What foods are not recommended? The items listed may not be a complete list. Talk with your dietitian about what dietary choices are best for you. Grains Baked goods made with fat, such as croissants, muffins, or some breads. Dry pasta or rice meal packs. Vegetables Creamed or fried vegetables. Vegetables in a cheese sauce. Regular canned vegetables (not low-sodium or reduced-sodium). Regular canned tomato sauce and paste (not  low-sodium or reduced-sodium). Regular tomato and vegetable juice (not low-sodium or reduced-sodium). Angie Fava. Olives. Fruits Canned fruit in a light or heavy syrup. Fried fruit. Fruit in cream or butter sauce. Meat and other protein foods Fatty cuts of meat. Ribs. Fried meat. Berniece Salines. Sausage. Bologna and other processed lunch meats. Salami. Fatback. Hotdogs. Bratwurst. Salted nuts and seeds. Canned beans with added salt. Canned or smoked fish. Whole eggs or egg yolks. Chicken or [place] with skin. Dairy Whole or 2% milk, cream, and half-and-half. Whole or full-fat cream cheese. Whole-fat or sweetened yogurt. Full-fat cheese. Nondairy creamers. Whipped toppings. Processed cheese and cheese spreads. Fats and oils Butter. Stick margarine. Lard. Shortening. Ghee. Bacon fat. Tropical oils, such as coconut, palm kernel, or palm oil. Seasoning and other foods Salted popcorn and pretzels. Onion salt, garlic salt, seasoned salt, table salt, and sea salt. Worcestershire sauce. Tartar sauce. Barbecue sauce. Teriyaki sauce. Soy sauce, including reduced-sodium. Steak sauce. Canned and packaged gravies. Fish sauce. Oyster sauce.  Cocktail sauce. Horseradish that you find on the shelf. Ketchup. Mustard. Meat flavorings and tenderizers. Bouillon cubes. Hot sauce and Tabasco sauce. Premade or packaged marinades. Premade or packaged taco seasonings. Relishes. Regular salad dressings. Where to find more information:  National Heart, Lung, and [place]: https://wilson-eaton.com/  American Heart Association: www.heart.org Summary  The DASH eating plan is a healthy eating plan that has been shown to reduce high blood pressure (hypertension). It may also reduce your risk for type 2 diabetes, heart disease, and stroke.  With the DASH eating plan, you should limit salt (sodium) intake to 2,300 mg a day. If you have hypertension, you may need to reduce your sodium intake to 1,500 mg a day.  When on the DASH eating plan,  aim to eat more fresh fruits and vegetables, whole grains, lean proteins, low-fat dairy, and heart-healthy fats.  Work with your health care provider or diet and nutrition specialist (dietitian) to adjust your eating plan to your individual calorie needs. This information is not intended to replace advice given to you by your health care provider. Make sure you discuss any questions you have with your health care provider. Document Revised: 07/12/2017 Document Reviewed: 07/23/2016 Elsevier Patient Education  2020 [employer].

## 2020-03-23 ENCOUNTER — Ambulatory Visit (INDEPENDENT_AMBULATORY_CARE_PROVIDER_SITE_OTHER): Payer: Medicare HMO | Admitting: Family Medicine

## 2020-03-23 ENCOUNTER — Other Ambulatory Visit: Payer: Self-pay

## 2020-03-23 ENCOUNTER — Encounter: Payer: Self-pay | Admitting: Family Medicine

## 2020-03-23 VITALS — BP 136/62 | HR 95 | Temp 97.9°F | Ht 70.0 in | Wt 223.0 lb

## 2020-03-23 DIAGNOSIS — E782 Mixed hyperlipidemia: Secondary | ICD-10-CM

## 2020-03-23 DIAGNOSIS — Z794 Long term (current) use of insulin: Secondary | ICD-10-CM | POA: Diagnosis not present

## 2020-03-23 DIAGNOSIS — E118 Type 2 diabetes mellitus with unspecified complications: Secondary | ICD-10-CM | POA: Diagnosis not present

## 2020-03-23 DIAGNOSIS — I119 Hypertensive heart disease without heart failure: Secondary | ICD-10-CM

## 2020-03-23 MED ORDER — MECLIZINE HCL 25 MG PO TABS: 25.0000 mg | ORAL_TABLET | Freq: Three times a day (TID) | ORAL | 2 refills | Status: DC | PRN

## 2020-03-23 MED ORDER — INSULIN LISPRO (1 UNIT DIAL) 100 UNIT/ML (KWIKPEN): 4.0000 [IU] | PEN_INJECTOR | Freq: Two times a day (BID) | SUBCUTANEOUS | 11 refills | Status: DC

## 2020-03-23 NOTE — Patient Instructions (Addendum)
Continue lantus at 65 U daily at night  Humalog before each breakfast and supper based on sugars.  150 - 200 give 4 U 201-250 give 6 U 251 - 300 give 10 U 301-350 give 14 U  351- 400 give 18 U

## 2020-03-23 NOTE — Progress Notes (Signed)
New Patient Office Visit  Subjective:  Patient ID: Joshua Nelson, male    DOB: 23-Jun-1951  Age: 69 y.o. MRN: [id number]  CC:  Chief Complaint  Patient presents with  . Hyperlipidemia  . Diabetes    HPI Pt presents with hyperlipidemia.  Date of diagnosis 06/2010.  Current treatment includes Lipitor and zetia, but is not following  low cholesterol/low fat diet.  He does not exercise.  He denies experiencing any hypercholesterolemia related symptoms. Concurrent health problems include diabetes.      Dx with type 2 diabetes mellitus with diabetic polyneuropathy; specifically, this is type 2, non-insulin requiring diabetes, complicated by peripheral neuropathy and coronary artery disease.  Date of diagnosis 08/17/2013.  Primary symptoms reported include nerve dysfunction (manifested as numbness Feet ).  He checks his sugars 1-2 times per week, but he does not like to do it. His sugars > 300. Current meds include an oral hypoglycemic ( Glucotrol 10mg  bid, Janumet XR 50/1000 mg QD, Lantus 65 units QHS,  Prinivil for renoprotection, daily aspirin ( 81mg  ), lipid lowering agent ( Lipitor ), and Gabapentin 300 mg TID for peripheral neuropathy. HbA1c 8.7 on 03/01/2020. In regard to preventative care, his last ophthalmology exam was in 08/2019. Concurrent health problems include hypercholesterolemia and CAD.      Atherosclerotic heart disease of native coronary artery without angina pectoris details; he is here today for routine follow-up.  Josephanthony has a prior history of a myocardial infarction and is currently on a beta blocker.  His heart disease was first diagnosed 4 years ago. The course of the disease has been stable.  Currently, his treatment regimen consists of daily 81 mg aspirin, an ACEI ( lisinopril ),  Lipitor, metoprolol, and a nitrate ( p.r.n. sublingual nitroglycerin ).  No associated symptoms are reported. A cardiac cath has been done ( performed 06/2010 ).    Past Medical History:  Diagnosis  Date  . Coronary artery disease   . CVA (cerebral vascular accident) ([place])   . Diabetes mellitus without complication ([place])   . Hypertension   . Insomnia   . Myocardial infarct [facility])     Past Surgical History:  Procedure Laterality Date  . heart stents     x 3  . left cataract  10/2019  . right cataract Right 08/2019    Family History  Problem Relation Age of Onset  . Endometrial cancer Mother   . CAD Mother   . AAA (abdominal aortic aneurysm) Mother   . Healthy Father   . Heart attack Brother     Social History   Socioeconomic History  . Marital status: Married    Spouse name: Not on file  . Number of children: 1  . Years of education: 63  . Highest education level: High school graduate  Occupational History  . Occupation: Retired  Tobacco Use  . Smoking status: Former Smoker    Quit date: 2003    Years since quitting: 18.6  . Smokeless tobacco: Never Used  Vaping Use  . Vaping Use: Never used  Substance and Sexual Activity  . Alcohol use: No  . Drug use: Never  . Sexual activity: Not on file  Other Topics Concern  . Not on file  Social History Narrative   Lives at home with his wife.   2 cups caffeine per day.   Right-handed.   Social Determinants of Health   Financial Resource Strain:   . Difficulty of Paying Living Expenses:  Food Insecurity:   . Worried About Charity fundraiser in the Last Year:   . Arboriculturist in the Last Year:   Transportation Needs:   . Film/video editor (Medical):   Marland Kitchen Lack of Transportation (Non-Medical):   Physical Activity:   . Days of Exercise per Week:   . Minutes of Exercise per Session:   Stress:   . Feeling of Stress :   Social Connections:   . Frequency of Communication with Friends and Family:   . Frequency of Social Gatherings with Friends and Family:   . Attends Religious Services:   . Active Member of Clubs or Organizations:   . Attends Archivist Meetings:   Marland Kitchen Marital Status:     Intimate Partner Violence:   . Fear of Current or Ex-Partner:   . Emotionally Abused:   Marland Kitchen Physically Abused:   . Sexually Abused:     ROS Review of Systems  Constitutional: Negative for chills, diaphoresis, fatigue and fever.  HENT: Negative for congestion, ear pain and sore throat.   Respiratory: Negative for cough and shortness of breath.   Cardiovascular: Negative for chest pain and leg swelling.  Gastrointestinal: Negative for abdominal pain, constipation, diarrhea, nausea and vomiting.  Genitourinary: Negative for dysuria and urgency.  Musculoskeletal: Positive for arthralgias. Negative for myalgias.  Neurological: Positive for dizziness. Negative for headaches.  Psychiatric/Behavioral: Negative for dysphoric mood. The patient is not nervous/anxious.     Objective:   Today's Vitals: BP 136/62   Pulse 95   Temp 97.9 F (36.6 C)   Ht 5\' 10"  (1.778 m)   Wt 223 lb (101.2 kg)   SpO2 99%   BMI 32.00 kg/m   Physical Exam Constitutional:      Appearance: Normal appearance.  Neck:     Vascular: No carotid bruit.  Cardiovascular:     Rate and Rhythm: Normal rate and regular rhythm.     Pulses: Normal pulses.     Heart sounds: Normal heart sounds.  Pulmonary:     Effort: Pulmonary effort is normal.     Breath sounds: Normal breath sounds.  Abdominal:     General: Bowel sounds are normal.     Palpations: Abdomen is soft.     Tenderness: There is no abdominal tenderness.  Musculoskeletal:        General: Tenderness:       Cervical back: Normal range of motion.  Skin:    General: Skin is warm and dry.  Neurological:     Mental Status: He is alert and oriented to person, place, and time.  Psychiatric:        Mood and Affect: Mood normal.        Behavior: Behavior normal.    Diabetic Foot Exam - Simple   Simple Foot Form Visual Inspection See comments: Yes Sensation Testing See comments: Yes Pulse Check Posterior Tibialis and Dorsalis pulse intact  bilaterally: Yes Comments Decreased sensation of feet.  Onychomycosis. Long, thick nails.       Assessment & Plan:  1. Mixed hyperlipidemia Well controlled. The current medical regimen is effective;  continue present plan and medications.. Continue to work on eating a healthy diet and exercise.    2. Controlled type 2 diabetes mellitus with complication, with long-term current use of insulin (HCC) Control: poor Recommend check sugars fasting twice daily. Order free style libre. Initial meter and sensor given and taught how to apply sensor.  Recommend check feet daily. Recommend  annual eye exams. Medicines:  Continue glipizide and janumet. Continue lantus at 65 U daily at night  Humalog before each breakfast and supper based on sugars.  150 - 200 give 4 U 201-250 give 6 U 251 - 300 give 10 U 301-350 give 14 U  351- 400 give 18 U Continue to work on eating a healthy diet and exercise.   3. Hypertensive heart disease without heart failure Well controlled blood pressure.  No changes to medicines.  Continue to work on eating a healthy diet and exercise.   4. BMI 32 adult obesity. Recommend eat healthy and exercise.   Outpatient Encounter Medications as of 03/23/2020  Medication Sig  . meclizine (ANTIVERT) 25 MG tablet Take 1 tablet (25 mg total) by mouth 3 (three) times daily as needed for dizziness.  . [DISCONTINUED] meclizine (ANTIVERT) 25 MG tablet Take 25 mg by mouth 3 (three) times daily as needed for dizziness.  Marland Kitchen atorvastatin (LIPITOR) 80 MG tablet TAKE ONE (1) TABLET ONCE DAILY  . ezetimibe (ZETIA) 10 MG tablet TAKE ONE (1) TABLET ONCE DAILY  . gabapentin (NEURONTIN) 300 MG capsule TAKE ONE (1) CAPSULE THREE (3) TIMES EACH DAY  . glipiZIDE (GLUCOTROL XL) 10 MG 24 hr tablet TAKE ONE TABLET TWICE DAILY (Patient not taking: Reported on 03/23/2020)  . insulin lispro (HUMALOG) 100 UNIT/ML KwikPen Inject 0.04 mLs (4 Units total) into the skin 2 (two) times daily before a  meal. Sliding scale given to patient. Max humalog is 20 U.  Marland Kitchen LANTUS SOLOSTAR 100 UNIT/ML Solostar Pen INJECT 60 UNITS ONCE DAILY IN THE EVENING. (Patient taking differently: Inject 65 Units into the skin daily. )  . lisinopril (ZESTRIL) 2.5 MG tablet TAKE ONE (1) TABLET ONCE DAILY  . metoprolol succinate (TOPROL-XL) 25 MG 24 hr tablet TAKE ONE (1) TABLET ONCE DAILY  . ofloxacin (OCUFLOX) 0.3 % ophthalmic solution  (Patient not taking: Reported on 03/22/2020)  . sitaGLIPtin-metformin (JANUMET) 50-1000 MG tablet Take 1 tablet by mouth daily.  Marland Kitchen zolpidem (AMBIEN) 10 MG tablet Take 10 mg by mouth at bedtime.   . [DISCONTINUED] aspirin EC 325 MG tablet Take 650 mg by mouth.  (Patient not taking: Reported on 03/22/2020)  . [DISCONTINUED] glimepiride (AMARYL) 4 MG tablet TAKE 1 TABLET BY MOUTH EVERY MORNING (Patient not taking: Reported on 03/22/2020)  . [DISCONTINUED] JANUVIA 100 MG tablet  (Patient not taking: Reported on 03/22/2020)  . [DISCONTINUED] metFORMIN (GLUCOPHAGE) 1000 MG tablet  (Patient not taking: Reported on 03/22/2020)   No facility-administered encounter medications on file as of 03/23/2020.    Follow-up: Return in about 3 months (around 06/23/2020) for fasting.   [name], MD

## 2020-03-27 ENCOUNTER — Encounter: Payer: Self-pay | Admitting: Family Medicine

## 2020-03-28 ENCOUNTER — Ambulatory Visit: Payer: Medicare HMO

## 2020-03-28 NOTE — Progress Notes (Signed)
Done. Joshua Nelson

## 2020-03-29 ENCOUNTER — Telehealth: Payer: Self-pay

## 2020-03-29 NOTE — Telephone Encounter (Signed)
PA for Lantus submitted and approved via covermymeds.

## 2020-04-06 ENCOUNTER — Other Ambulatory Visit: Payer: Self-pay | Admitting: Physician Assistant

## 2020-04-07 ENCOUNTER — Ambulatory Visit: Payer: Medicare HMO | Admitting: Podiatry

## 2020-04-12 ENCOUNTER — Ambulatory Visit: Payer: Self-pay

## 2020-04-12 DIAGNOSIS — Z794 Long term (current) use of insulin: Secondary | ICD-10-CM

## 2020-04-12 DIAGNOSIS — E118 Type 2 diabetes mellitus with unspecified complications: Secondary | ICD-10-CM

## 2020-04-12 NOTE — Chronic Care Management (AMB) (Signed)
Lantus and Humalog approved through Patient assistance through 08/12/2020. Lantus shipped to office and Humalog shipped to patient's home.   Patient's wife reached out requesting samples of Janumet since completing letter of appeal for patient assistance.   Sherre Poot, PharmD, [facility] Clinical Pharmacist [facility] [phone] (office) [phone] (mobile)

## 2020-05-24 ENCOUNTER — Telehealth: Payer: Self-pay

## 2020-05-24 ENCOUNTER — Other Ambulatory Visit: Payer: Self-pay | Admitting: Physician Assistant

## 2020-05-24 NOTE — Chronic Care Management (AMB) (Signed)
Chronic Care Management Pharmacy Assistant   Name: Joshua Nelson  MRN: [id number] DOB: [birth year]  Reason for Encounter: Disease State- Diabetes Adherence Call  Patient Questions:  1.  Have you seen any other providers since your last visit? No    2.  Any changes in your medicines or health? No.     PCP : [name], MD  Allergies:  No Known Allergies  Medications: Outpatient Encounter Medications as of 05/24/2020  Medication Sig  . ezetimibe (ZETIA) 10 MG tablet TAKE ONE (1) TABLET ONCE DAILY  . gabapentin (NEURONTIN) 300 MG capsule TAKE ONE (1) CAPSULE THREE (3) TIMES EACH DAY  . glipiZIDE (GLUCOTROL XL) 10 MG 24 hr tablet TAKE ONE TABLET TWICE DAILY (Patient not taking: Reported on 03/23/2020)  . insulin lispro (HUMALOG) 100 UNIT/ML KwikPen Inject 0.04 mLs (4 Units total) into the skin 2 (two) times daily before a meal. Sliding scale given to patient. Max humalog is 20 U.  Marland Kitchen JANUMET 50-1000 MG tablet TAKE ONE (1) TABLET BY MOUTH ONCE DAILY  . LANTUS SOLOSTAR 100 UNIT/ML Solostar Pen INJECT 60 UNITS ONCE DAILY IN THE EVENING. (Patient taking differently: Inject 65 Units into the skin daily. )  . lisinopril (ZESTRIL) 2.5 MG tablet TAKE ONE (1) TABLET ONCE DAILY  . meclizine (ANTIVERT) 25 MG tablet Take 1 tablet (25 mg total) by mouth 3 (three) times daily as needed for dizziness.  Marland Kitchen ofloxacin (OCUFLOX) 0.3 % ophthalmic solution  (Patient not taking: Reported on 03/22/2020)  . zolpidem (AMBIEN) 10 MG tablet Take 10 mg by mouth at bedtime.   . [DISCONTINUED] atorvastatin (LIPITOR) 80 MG tablet TAKE ONE (1) TABLET ONCE DAILY  . [DISCONTINUED] metoprolol succinate (TOPROL-XL) 25 MG 24 hr tablet TAKE ONE (1) TABLET ONCE DAILY   No facility-administered encounter medications on file as of 05/24/2020.    Current Diagnosis: Patient Active Problem List   Diagnosis Date Noted  . Mixed hyperlipidemia 11/30/2019  . Controlled type 2 diabetes mellitus with complication, with  long-term current use of insulin ([place]) 11/30/2019  . Hypertensive heart disease without heart failure 11/30/2019  . BMI 31.0-31.9,adult 11/30/2019  . Chronic left shoulder pain 11/30/2019  . 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 Obstructive sleep apnea 04/03/2018  . Cerebrovascular accident (CVA) ([place]) 04/03/2018  . Benign paroxysmal positional vertigo 04/03/2018   Recent Relevant Labs: Lab Results  Component Value Date/Time   HGBA1C 8.7 (H) 03/01/2020 07:44 AM   HGBA1C 8.1 (H) 11/30/2019 09:51 AM   MICROALBUR 10 11/30/2019 09:50 AM    Kidney Function Lab Results  Component Value Date/Time   CREATININE 0.80 03/01/2020 07:44 AM   CREATININE 0.75 (L) 11/30/2019 09:51 AM   GFRNONAA 93 03/01/2020 07:44 AM   GFRAA 108 03/01/2020 07:44 AM    . Current antihyperglycemic regimen:  o Humalog 100 unit- inject 4 units into skin two time daily o Janumet 50-1000 mg one daily o Lantus Solostar 100 units inject 65 units once in evening  . What recent interventions/DTPs have been made to improve glycemic control:  o Patient is taking his Humalog , Lantus as directed by provider, But he has not been his Janumet for a few days. Wife states that patient is not on a healthy diet and he is not active . Marland Kitchen Have there been any recent hospitalizations or ED visits since last visit with CPP? No  . Patient denies hypoglycemic symptoms, including Pale, Sweaty, Shaky, Hungry, Nervous/irritable and Vision changes . Patient denies hyperglycemic symptoms, including blurry vision,  excessive thirst, fatigue, polyuria and weakness . How often are you checking your blood sugar?  Wife states that patient does not check his blood sugars and she has not checked his blood sugars in over a week. But last time she check his levels were over 200. Marland Kitchen  What are your blood sugars ranging?  o Fasting: None  o Before meals: None o After meals: None o Bedtime: None  . During the week, how often does your blood glucose drop below  70? Wife states that patient has not been checking blood sugar.    . Are you checking your feet daily/regularly?   Wife states that patient does check feet and he has no open sores or pain . Wife did state that patient does complain of numbness sometimes  Adherence Review: Is the patient currently on a STATIN medication? Yes. Atorvastatin 80 mg Is the patient currently on ACE/ARB medication? Yes. Lisinopril 2.5 mg Does the patient have >5 day gap between last estimated fill dates? No  Goals Addressed            This Visit's Progress   . Pharmacy Care Plan   Not on track    [place] (see longitudinal plan of care for additional care plan information)  Current Barriers:  . Chronic Disease Management support, education, and care coordination needs related to Hypertension, Hyperlipidemia, and Diabetes   Hypertension BP Readings from Last 3 Encounters:  11/30/19 128/72  04/03/18 124/73  12/07/14 144/81   . Pharmacist Clinical Goal(s): o Over the next 90 days, patient will work with PharmD and providers to maintain BP goal <130/80 . Current regimen:  o Metoprolol 25 mg daily o Lisinopril 2.5 mg daily  . Interventions: o Patient's blood pressure medication is affordable per wife.  o Patient uses pill box to keep medications on time.  . Patient self care activities - Over the next 90 days, patient will: o Check BP weekly, document, and provide at future appointments o Ensure daily salt intake < 2300 mg/day  Hyperlipidemia Lab Results  Component Value Date/Time   LDLCALC 94 03/01/2020 07:44 AM   . Pharmacist Clinical Goal(s): o Over the next 90 days, patient will work with PharmD and providers to achieve LDL goal <70 . Current regimen:  o Atorvastatin 80 mg daily o Ezetemibe 10 mg daily  . Interventions: o Patient's wife reports medications affordable. Mr. Bogusz uses a pill box to take medications.   . Patient self care activities - Over the next 90 days, patient  will: o Continue to take medication as prescribed.  o Contract provider or patient with any questions or concerns.   Diabetes Lab Results  Component Value Date/Time   HGBA1C 8.7 (H) 03/01/2020 07:44 AM   HGBA1C 8.1 (H) 11/30/2019 09:51 AM   . Pharmacist Clinical Goal(s): o Over the next 90 days, patient will work with PharmD and providers to achieve A1c goal <7% . Current regimen:  o Lantus 65 units daily o Janumet 50/1000 mg daily  o Glipizide XL 10 mg twice daily . Interventions: o Working to help patient get Lantus and Janumet through patient assistance. Patient was in the coverage gap last year and cost of Janumet is too high currently.  - 04/12/2020 - Patient assistance approved for Lantus and Humalog.  o Recommend patient begin checking blood sugar once daily every day.  . Patient self care activities - Over the next 90 days, patient will: o Check blood sugar once daily, document, and  provide at future appointments o Contact provider with any episodes of hypoglycemia  Medication management . Pharmacist Clinical Goal(s): o Over the next 90 days, patient will work with PharmD and providers to achieve optimal medication adherence . Current pharmacy: [facility] . Interventions o Comprehensive medication review performed. o Continue current medication management strategy . Patient self care activities - Over the next 90 days, patient will: o Focus on medication adherence by continuing to use pill box.  o Take medications as prescribed o Report any questions or concerns to PharmD and/or provider(s)  Please see past updates related to this goal by clicking on the "Past Updates" button in the selected goal         Follow-Up:  Pharmacist Review -  Husband wanted me to talk with wife,(Kathy)  to have her answer any health questions. Wife states she is patient's caregiver and takes care of his medications. Wife states that patient has been without his Janumet for a few days,  and they can not afford to pick up at pharmacy.  I did asked [name] if she could bring back the patient assistance form to office, so we can help get patient assistance for  medications , states she will drop off.   Called [name], back to let her know that PCP had no samples, for Janumet and to see how many tablets patient had left, she informed me that he has been out of his Janumet  for a few days, and they can not afford to pick up at pharmacy.  Elly Modena Brown,CPP checked  to see if PCP had any samples, for Janumet, no samples at office.   Elly Modena [employer] CPP, Notified  Judithann Sheen, [facility] Clinical Pharmacist Assistant [phone]

## 2020-06-01 ENCOUNTER — Other Ambulatory Visit: Payer: Self-pay | Admitting: Family Medicine

## 2020-06-08 ENCOUNTER — Telehealth: Payer: Self-pay

## 2020-06-08 NOTE — Telephone Encounter (Signed)
Please call and have them resend the forms.

## 2020-06-08 NOTE — Telephone Encounter (Signed)
Please advise.  Astrid Divine from [facility] called in about a referral they got from [place] in August for diabetic supplies. They need the patients DOB corrected on th PO. They have the DOB as 05/23/1951.  Hoyle Sauer I could not find these orders or any referrals.  Call back [phone]

## 2020-06-09 NOTE — Telephone Encounter (Signed)
A message was left to resend the paperwork.

## 2020-06-14 ENCOUNTER — Telehealth: Payer: Self-pay

## 2020-06-14 NOTE — Chronic Care Management (AMB) (Signed)
Spoke to patient's wife to let her know that we have 14 day sample pack of Janumet 50/1000 ready for pickup in the sample closet. Patient's wife acknowledges understanding and will stop by to pick up.   Pharmacist encouraged patient to submit attestation to [employer] for [place] to be approved for patient assistance. Pharmacist called [employer] and was informed that they mailed the original application and attestation form to patient for completion and return.   Sherre Poot, PharmD, [facility] Clinical Pharmacist [facility] [phone] (office) [phone] (mobile)

## 2020-06-22 ENCOUNTER — Other Ambulatory Visit: Payer: Self-pay | Admitting: Physician Assistant

## 2020-06-28 ENCOUNTER — Other Ambulatory Visit: Payer: Self-pay

## 2020-06-28 ENCOUNTER — Other Ambulatory Visit: Payer: Self-pay | Admitting: Family Medicine

## 2020-06-28 ENCOUNTER — Ambulatory Visit (INDEPENDENT_AMBULATORY_CARE_PROVIDER_SITE_OTHER): Payer: Medicare HMO | Admitting: Family Medicine

## 2020-06-28 ENCOUNTER — Encounter: Payer: Self-pay | Admitting: Family Medicine

## 2020-06-28 VITALS — BP 128/72 | HR 72 | Temp 97.4°F | Resp 18 | Ht 70.0 in | Wt 221.4 lb

## 2020-06-28 DIAGNOSIS — E782 Mixed hyperlipidemia: Secondary | ICD-10-CM

## 2020-06-28 DIAGNOSIS — E1169 Type 2 diabetes mellitus with other specified complication: Secondary | ICD-10-CM | POA: Diagnosis not present

## 2020-06-28 DIAGNOSIS — Z23 Encounter for immunization: Secondary | ICD-10-CM

## 2020-06-28 DIAGNOSIS — I700 Atherosclerosis of aorta: Secondary | ICD-10-CM

## 2020-06-28 DIAGNOSIS — I119 Hypertensive heart disease without heart failure: Secondary | ICD-10-CM | POA: Diagnosis not present

## 2020-06-28 MED ORDER — INSULIN LISPRO (1 UNIT DIAL) 100 UNIT/ML (KWIKPEN): 4.0000 [IU] | PEN_INJECTOR | Freq: Two times a day (BID) | SUBCUTANEOUS | 3 refills | Status: DC

## 2020-06-28 MED ORDER — FREESTYLE LIBRE 2 SENSOR MISC: 1.0000 | 3 refills | Status: DC

## 2020-06-28 MED ORDER — INSULIN LISPRO (1 UNIT DIAL) 100 UNIT/ML (KWIKPEN)
4.0000 [IU] | PEN_INJECTOR | Freq: Two times a day (BID) | SUBCUTANEOUS | 1 refills | Status: DC
Start: 2020-06-28 — End: 2020-06-28

## 2020-06-28 MED ORDER — FREESTYLE LIBRE 2 READER DEVI: 0 refills | Status: DC

## 2020-06-28 NOTE — Progress Notes (Signed)
Subjective:  Patient ID: Joshua Nelson, male    DOB: 1951-05-09  Age: 69 y.o. MRN: [id number]  Chief Complaint  Patient presents with  . 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  Ran out of humalog 2 weeks ago. Last eye exam was January 2021.  Checking sugars twice a day.  Running 120-200. Went up to 369 one day when overate.  Patient checks his feet. Patient normally sees foot doctor. Neuropathy in hands.   Coronary artery disease/hypertensive heart disease/history of MI: Currently on lisinopril, Lipitor, Zetia, metoprolol, nitroglycerin, and aspirin.   Denies chest pain.   History of stroke:  Lipitor, Zetia.  No sequela.  Insomnia: Currently on Ambien 10 mg once daily.  Current Outpatient Medications on File Prior to Visit  Medication Sig Dispense Refill  . atorvastatin (LIPITOR) 80 MG tablet TAKE ONE (1) TABLET ONCE DAILY 90 tablet 0  . ezetimibe (ZETIA) 10 MG tablet TAKE ONE (1) TABLET ONCE DAILY 30 tablet 2  . gabapentin (NEURONTIN) 300 MG capsule TAKE ONE (1) CAPSULE THREE (3) TIMES EACH DAY 270 capsule 2  . glipiZIDE (GLUCOTROL XL) 10 MG 24 hr tablet TAKE ONE TABLET TWICE DAILY 180 tablet 0  . insulin lispro (HUMALOG) 100 UNIT/ML KwikPen Inject 0.04 mLs (4 Units total) into the skin 2 (two) times daily before a meal. Sliding scale given to patient. Max humalog is 20 U. 15 mL 11  . JANUMET 50-1000 MG tablet TAKE ONE (1) TABLET BY MOUTH ONCE DAILY 90 tablet 1  . LANTUS SOLOSTAR 100 UNIT/ML Solostar Pen INJECT 60 UNITS ONCE DAILY IN THE EVENING. (Patient taking differently: Inject 65 Units into the skin daily. ) 45 mL 3  . lisinopril (ZESTRIL) 2.5 MG tablet TAKE ONE (1) TABLET ONCE DAILY 90 tablet 0  . meclizine (ANTIVERT) 25 MG  tablet Take 1 tablet (25 mg total) by mouth 3 (three) times daily as needed for dizziness. 30 tablet 2  . metoprolol succinate (TOPROL-XL) 25 MG 24 hr tablet TAKE ONE (1) TABLET ONCE DAILY 90 tablet 0  . ofloxacin (OCUFLOX) 0.3 % ophthalmic solution  (Patient not taking: Reported on 03/22/2020)    . zolpidem (AMBIEN) 10 MG tablet Take 10 mg by mouth at bedtime.      No current facility-administered medications on file prior to visit.   Past Medical History:  Diagnosis Date  . Coronary artery disease   . CVA (cerebral vascular accident) ([place])   . Diabetes mellitus without complication ([place])   . Hypertension   . Insomnia   . Myocardial infarct [facility])    Past Surgical History:  Procedure Laterality Date  . heart stents     x 3  . left cataract  10/2019  . right cataract Right 08/2019    Family History  Problem Relation Age of Onset  . Endometrial cancer Mother   . CAD Mother   . AAA (abdominal aortic aneurysm) Mother   . Healthy Father   . Heart attack Brother    Social History   Socioeconomic History  . Marital status: Married    Spouse name: Not on file  . Number of children: 1  . Years of education: 80  . Highest education level: High school graduate  Occupational History  .  Occupation: Retired  Tobacco Use  . Smoking status: Former Smoker    Quit date: 2003    Years since quitting: 18.8  . Smokeless tobacco: Never Used  Vaping Use  . Vaping Use: Never used  Substance and Sexual Activity  . Alcohol use: No  . Drug use: Never  . Sexual activity: Not on file  Other Topics Concern  . Not on file  Social History Narrative   Lives at home with his wife.   2 cups caffeine per day.   Right-handed.   Social Determinants of Health   Financial Resource Strain:   . Difficulty of Paying Living Expenses: Not on file  Food Insecurity:   . Worried About Charity fundraiser in the Last Year: Not on file  . Ran Out of Food in the Last Year: Not on file  Transportation  Needs:   . Lack of Transportation (Medical): Not on file  . Lack of Transportation (Non-Medical): Not on file  Physical Activity:   . Days of Exercise per Week: Not on file  . Minutes of Exercise per Session: Not on file  Stress:   . Feeling of Stress : Not on file  Social Connections:   . Frequency of Communication with Friends and Family: Not on file  . Frequency of Social Gatherings with Friends and Family: Not on file  . Attends Religious Services: Not on file  . Active Member of Clubs or Organizations: Not on file  . Attends Archivist Meetings: Not on file  . Marital Status: Not on file    Review of Systems  Constitutional: Negative for chills, fatigue and fever.  HENT: Negative for congestion, ear pain and sore throat.   Respiratory: Negative for cough and shortness of breath.   Cardiovascular: Negative for chest pain and palpitations.  Gastrointestinal: Negative for abdominal pain, constipation, diarrhea, nausea and vomiting.  Endocrine: Positive for polydipsia. Negative for polyphagia and polyuria.  Genitourinary: Positive for frequency. Negative for dysuria.  Musculoskeletal: Negative for arthralgias and myalgias.  Neurological: Positive for dizziness. Negative for headaches.  Psychiatric/Behavioral: Negative for dysphoric mood. The patient is not nervous/anxious.        No dysphoria     Objective:  BP 128/72   Pulse 72   Temp (!) 97.4 F (36.3 C)   Resp 18   Ht 5\' 10"  (1.778 m)   Wt 221 lb 6.4 oz (100.4 kg)   BMI 31.77 kg/m   BP/Weight 06/28/2020 03/23/2020 1/44/8185  Systolic BP 631 497 026  Diastolic BP 72 62 72  Wt. (Lbs) 221.4 223 220  BMI 31.77 32 31.57    Physical Exam Vitals reviewed.  Constitutional:      Appearance: Normal appearance.  Neck:     Vascular: No carotid bruit.  Cardiovascular:     Rate and Rhythm: Normal rate and regular rhythm.     Pulses: Normal pulses.     Heart sounds: Normal heart sounds.  Pulmonary:      Effort: Pulmonary effort is normal.     Breath sounds: Normal breath sounds. No wheezing, rhonchi or rales.  Abdominal:     General: Bowel sounds are normal.     Palpations: Abdomen is soft.     Tenderness: There is no abdominal tenderness.  Neurological:     Mental Status: He is alert and oriented to person, place, and time.  Psychiatric:        Mood and Affect: Mood normal.  Behavior: Behavior normal.     Diabetic Foot Exam - Simple   Simple Foot Form Diabetic Foot exam was performed with the following findings: Yes 06/28/2020  9:10 AM  Visual Inspection See comments: Yes Sensation Testing See comments: Yes Pulse Check Posterior Tibialis and Dorsalis pulse intact bilaterally: Yes Comments Decreased sensation.  Dry skin scaling anterior to toes dorsal surface.        Lab Results  Component Value Date   WBC 8.1 03/01/2020   HGB 12.8 (L) 03/01/2020   HCT 38.0 03/01/2020   PLT 172 03/01/2020   GLUCOSE 139 (H) 03/01/2020   CHOL 150 03/01/2020   TRIG 138 03/01/2020   HDL 31 (L) 03/01/2020   LDLCALC 94 03/01/2020   ALT 20 03/01/2020   AST 19 03/01/2020   NA 140 03/01/2020   K 4.8 03/01/2020   CL 102 03/01/2020   CREATININE 0.80 03/01/2020   BUN 11 03/01/2020   CO2 25 03/01/2020   HGBA1C 8.7 (H) 03/01/2020   MICROALBUR 10 11/30/2019      Assessment & Plan:   1. Mixed hyperlipidemia Well controlled.  No changes to medicines.  Continue to work on eating a healthy diet and exercise.  Labs drawn today.  - Lipid panel  2. Hypertensive heart disease without heart failure Well controlled.  No changes to medicines.  Continue to work on eating a healthy diet and exercise.  Labs drawn today.  - CBC with Differential/Platelet - Comprehensive metabolic panel - TSH  3. Combined hyperlipidemia associated with type 2 diabetes mellitus ([place]) Control: poor Recommend check sugars qac and qhs.  Recommend check feet daily. Recommend annual eye  exams. Medicines: no changes at this time.  Continue to work on eating a healthy diet and exercise.  Labs drawn today.   - Hemoglobin A1c  4. Need for immunization against influenza - Flu Vaccine QUAD High Dose(Fluad)  5. Need for vaccination - Pneumococcal polysaccharide vaccine 23-valent greater than or equal to 2yo subcutaneous/IM  6. Aortic atherosclerosis - continue statin.   Meds ordered this encounter  Medications  . Continuous Blood Gluc Receiver (FREESTYLE LIBRE 2 READER) DEVI    Sig: Check qac and qhs    Dispense:  1 each    Refill:  0  . Continuous Blood Gluc Sensor (FREESTYLE LIBRE 2 SENSOR) MISC    Sig: 1 each by Does not apply route every 14 (fourteen) days.    Dispense:  6 each    Refill:  3    Orders Placed This Encounter  Procedures  . CBC with Differential/Platelet  . Comprehensive metabolic panel  . Hemoglobin A1c  . Lipid panel  . TSH     I spent 30 minutes dedicated to the care of this patient on the date of this encounter to include face-to-face time with the patient, as well as: Preparing to see the patient (review of tests). Obtaining and/or reviewing separately obtained history. Performing a medically appropriate examination and/or evaluation. Counseling and educating the patient on free style libre. Ordering medications, tests, or procedures. Documenting clinical information in the electronic or other health record.   My nursing staff have aided in the documentation of this note on the behalf of [name], MD,as directed by  [name], MD and thoroughly reviewed by [name], MD.  Follow-up: Return in about 3 months (around 09/28/2020) for fasting. Needs AWV PRIOR TO THE END OF THE YEAR. Marland Kitchen  An After Visit Summary was printed and given to the patient.  [name], MD [facility] [phone]

## 2020-06-29 ENCOUNTER — Encounter: Payer: Self-pay | Admitting: Family Medicine

## 2020-06-29 LAB — CBC WITH DIFFERENTIAL/PLATELET
Basophils Absolute: 0.1 10*3/uL (ref 0.0–0.2)
Basos: 1 %
EOS (ABSOLUTE): 0.4 10*3/uL (ref 0.0–0.4)
Eos: 4 %
Hematocrit: 41 % (ref 37.5–51.0)
Hemoglobin: 13.8 g/dL (ref 13.0–17.7)
Immature Grans (Abs): 0.1 10*3/uL (ref 0.0–0.1)
Immature Granulocytes: 1 %
Lymphocytes Absolute: 2 10*3/uL (ref 0.7–3.1)
Lymphs: 21 %
MCH: 29.6 pg (ref 26.6–33.0)
MCHC: 33.7 g/dL (ref 31.5–35.7)
MCV: 88 fL (ref 79–97)
Monocytes Absolute: 0.7 10*3/uL (ref 0.1–0.9)
Monocytes: 7 %
Neutrophils Absolute: 6.3 10*3/uL (ref 1.4–7.0)
Neutrophils: 66 %
Platelets: 186 10*3/uL (ref 150–450)
RBC: 4.67 x10E6/uL (ref 4.14–5.80)
RDW: 13.1 % (ref 11.6–15.4)
WBC: 9.5 10*3/uL (ref 3.4–10.8)

## 2020-06-29 LAB — LIPID PANEL
Chol/HDL Ratio: 5.5 ratio — ABNORMAL HIGH (ref 0.0–5.0)
Cholesterol, Total: 132 mg/dL (ref 100–199)
HDL: 24 mg/dL — ABNORMAL LOW (ref >39)
LDL Chol Calc (NIH): 73 mg/dL (ref 0–99)
Triglycerides: 209 mg/dL — ABNORMAL HIGH (ref 0–149)
VLDL Cholesterol Cal: 35 mg/dL (ref 5–40)

## 2020-06-29 LAB — COMPREHENSIVE METABOLIC PANEL
ALT: 20 IU/L (ref 0–44)
AST: 16 IU/L (ref 0–40)
Albumin/Globulin Ratio: 1.8 (ref 1.2–2.2)
Albumin: 4.5 g/dL (ref 3.8–4.8)
Alkaline Phosphatase: 74 IU/L (ref 44–121)
BUN/Creatinine Ratio: 14 (ref 10–24)
BUN: 12 mg/dL (ref 8–27)
Bilirubin Total: 0.8 mg/dL (ref 0.0–1.2)
CO2: 23 mmol/L (ref 20–29)
Calcium: 9.4 mg/dL (ref 8.6–10.2)
Chloride: 97 mmol/L (ref 96–106)
Creatinine, Ser: 0.84 mg/dL (ref 0.76–1.27)
GFR calc Af Amer: 105 mL/min/{1.73_m2} (ref >59)
GFR calc non Af Amer: 91 mL/min/{1.73_m2} (ref >59)
Globulin, Total: 2.5 g/dL (ref 1.5–4.5)
Glucose: 362 mg/dL — ABNORMAL HIGH (ref 65–99)
Potassium: 4.9 mmol/L (ref 3.5–5.2)
Sodium: 136 mmol/L (ref 134–144)
Total Protein: 7 g/dL (ref 6.0–8.5)

## 2020-06-29 LAB — HEMOGLOBIN A1C
Est. average glucose Bld gHb Est-mCnc: 246 mg/dL
Hgb A1c MFr Bld: 10.2 % — ABNORMAL HIGH (ref 4.8–5.6)

## 2020-06-29 LAB — CARDIOVASCULAR RISK ASSESSMENT

## 2020-06-29 LAB — TSH: TSH: 0.687 u[IU]/mL (ref 0.450–4.500)

## 2020-06-30 ENCOUNTER — Other Ambulatory Visit: Payer: Self-pay

## 2020-06-30 MED ORDER — OMEGA-3-ACID ETHYL ESTERS 1 G PO CAPS: 2.0000 g | ORAL_CAPSULE | Freq: Two times a day (BID) | ORAL | 0 refills | Status: DC

## 2020-06-30 MED ORDER — INSULIN LISPRO (1 UNIT DIAL) 100 UNIT/ML (KWIKPEN)
4.0000 [IU] | PEN_INJECTOR | Freq: Two times a day (BID) | SUBCUTANEOUS | 3 refills | Status: DC
Start: 2020-06-30 — End: 2020-07-14

## 2020-07-12 ENCOUNTER — Telehealth: Payer: Self-pay

## 2020-07-12 ENCOUNTER — Ambulatory Visit (INDEPENDENT_AMBULATORY_CARE_PROVIDER_SITE_OTHER): Payer: Medicare HMO | Admitting: Family Medicine

## 2020-07-12 ENCOUNTER — Encounter: Payer: Self-pay | Admitting: Family Medicine

## 2020-07-12 ENCOUNTER — Other Ambulatory Visit: Payer: Self-pay

## 2020-07-12 VITALS — BP 132/64 | HR 85 | Resp 20 | Ht 69.0 in | Wt 223.2 lb

## 2020-07-12 DIAGNOSIS — R32 Unspecified urinary incontinence: Secondary | ICD-10-CM

## 2020-07-12 NOTE — Patient Instructions (Addendum)
Joshua Nelson , Thank you for taking time to come for your Medicare Wellness Visit. I appreciate your ongoing commitment to your health goals. Please review the following plan we discussed and let me know if I can assist you in the future.   These are the goals we discussed:  Consider life alert Consider handles in the shower Consider coloquard-check with [employer] on coverage Referral to urology-concern incontience Work with Pharmacist and Dr. Tobie Poet on diabetic control Call podiatry for evaluation Goals    . Pharmacy Care Plan     CARE PLAN ENTRY (see longitudinal plan of care for additional care plan information)  Current Barriers:  . Chronic Disease Management support, education, and care coordination needs related to Hypertension, Hyperlipidemia, and Diabetes   Hypertension BP Readings from Last 3 Encounters:  11/30/19 128/72  04/03/18 124/73  12/07/14 144/81   . Pharmacist Clinical Goal(s): o Over the next 90 days, patient will work with PharmD and providers to maintain BP goal <130/80 . Current regimen:  o Metoprolol 25 mg daily o Lisinopril 2.5 mg daily  . Interventions: o Patient's blood pressure medication is affordable per wife.  o Patient uses pill box to keep medications on time.  . Patient self care activities - Over the next 90 days, patient will: o Check BP weekly, document, and provide at future appointments o Ensure daily salt intake < 2300 mg/day  Hyperlipidemia Lab Results  Component Value Date/Time   LDLCALC 94 03/01/2020 07:44 AM   . Pharmacist Clinical Goal(s): o Over the next 90 days, patient will work with PharmD and providers to achieve LDL goal <70 . Current regimen:  o Atorvastatin 80 mg daily o Ezetemibe 10 mg daily  . Interventions: o Patient's wife reports medications affordable. Joshua Nelson uses a pill box to take medications.   . Patient self care activities - Over the next 90 days, patient will: o Continue to take medication as prescribed.   o Contract provider or patient with any questions or concerns.   Diabetes Lab Results  Component Value Date/Time   HGBA1C 8.7 (H) 03/01/2020 07:44 AM   HGBA1C 8.1 (H) 11/30/2019 09:51 AM   . Pharmacist Clinical Goal(s): o Over the next 90 days, patient will work with PharmD and providers to achieve A1c goal <7% . Current regimen:  o Lantus 65 units daily o Janumet 50/1000 mg daily  o Glipizide XL 10 mg twice daily . Interventions: o Working to help patient get Lantus and Janumet through patient assistance. Patient was in the coverage gap last year and cost of Janumet is too high currently.  - 04/12/2020 - Patient assistance approved for Lantus and Humalog.  o Recommend patient begin checking blood sugar once daily every day.  . Patient self care activities - Over the next 90 days, patient will: o Check blood sugar once daily, document, and provide at future appointments o Contact provider with any episodes of hypoglycemia  Medication management . Pharmacist Clinical Goal(s): o Over the next 90 days, patient will work with PharmD and providers to achieve optimal medication adherence . Current pharmacy: [facility] . Interventions o Comprehensive medication review performed. o Continue current medication management strategy . Patient self care activities - Over the next 90 days, patient will: o Focus on medication adherence by continuing to use pill box.  o Take medications as prescribed o Report any questions or concerns to PharmD and/or provider(s)  Please see past updates related to this goal by clicking on the "Past Updates"  button in the selected goal         This is a list of the screening recommended for you and due dates:  Health Maintenance  Topic Date Due  .  Hepatitis C: One time screening is recommended by Center for Disease Control  (CDC) for  adults born from [birth year] through 1963.   Never done  . Eye exam for diabetics  Never done  . Tetanus Vaccine  Never  done  . Colon Cancer Screening  Never done  . Hemoglobin A1C  12/26/2020  . Complete foot exam   06/28/2021  . Pneumonia vaccines (2 of 2 - PCV13) 06/28/2021  . Flu Shot  Completed  . COVID-19 Vaccine  Completed  CONSIDER COVID BOOSTER

## 2020-07-12 NOTE — Progress Notes (Signed)
Subjective:   Joshua Nelson is a 69 y.o. male who presents for Medicare Annual/Subsequent preventive examination.  Cardiac Risk Factors include: advanced age (>53men, >61 women)     Objective:    Today's Vitals   07/12/20 1457  BP: 132/64  Pulse: 85  Resp: 20  SpO2: 97%  Weight: 223 lb 3.2 oz (101.2 kg)  Height: 5\' 9"  (1.753 m)   Body mass index is 32.96 kg/m.  Advanced Directives 07/12/2020 12/07/2014 12/03/2014  Does Patient Have a Medical Advance Directive? No Yes No  Type of Advance Directive - [place] -  Would patient like information on creating a medical advance directive? - No - patient declined information No - patient declined information    Current Medications (verified) Outpatient Encounter Medications as of 07/12/2020  Medication Sig  . atorvastatin (LIPITOR) 80 MG tablet TAKE ONE (1) TABLET ONCE DAILY  . Continuous Blood Gluc Receiver (FREESTYLE LIBRE 2 READER) DEVI Check qac and qhs  . Continuous Blood Gluc Sensor (FREESTYLE LIBRE 2 SENSOR) MISC 1 each by Does not apply route every 14 (fourteen) days.  Marland Kitchen ezetimibe (ZETIA) 10 MG tablet TAKE ONE (1) TABLET ONCE DAILY  . gabapentin (NEURONTIN) 300 MG capsule TAKE ONE (1) CAPSULE THREE (3) TIMES EACH DAY  . glipiZIDE (GLUCOTROL XL) 10 MG 24 hr tablet TAKE ONE TABLET TWICE DAILY  . insulin lispro (HUMALOG) 100 UNIT/ML KwikPen Inject 4 Units into the skin 2 (two) times daily before a meal. Sliding scale given to patient. Max humalog is 20 U.  Marland Kitchen JANUMET 50-1000 MG tablet TAKE ONE (1) TABLET BY MOUTH ONCE DAILY  . LANTUS SOLOSTAR 100 UNIT/ML Solostar Pen INJECT 60 UNITS ONCE DAILY IN THE EVENING. (Patient taking differently: Inject 65 Units into the skin daily. )  . lisinopril (ZESTRIL) 2.5 MG tablet TAKE ONE (1) TABLET ONCE DAILY  . meclizine (ANTIVERT) 25 MG tablet Take 1 tablet (25 mg total) by mouth 3 (three) times daily as needed for dizziness.  . metoprolol succinate (TOPROL-XL) 25 MG 24 hr  tablet TAKE ONE (1) TABLET ONCE DAILY  . ofloxacin (OCUFLOX) 0.3 % ophthalmic solution   . omega-3 acid ethyl esters (LOVAZA) 1 g capsule Take 2 capsules (2 g total) by mouth 2 (two) times daily.  Marland Kitchen zolpidem (AMBIEN) 10 MG tablet Take 10 mg by mouth at bedtime.    No facility-administered encounter medications on file as of 07/12/2020.    Allergies (verified) Patient has no known allergies.   History: Past Medical History:  Diagnosis Date  . Coronary artery disease   . CVA (cerebral vascular accident) ([place])   . Diabetes mellitus without complication ([place])   . Hypertension   . Insomnia   . Myocardial infarct [facility])    Past Surgical History:  Procedure Laterality Date  . heart stents     x 3  . left cataract  10/2019  . right cataract Right 08/2019   Family History  Problem Relation Age of Onset  . Endometrial cancer Mother   . CAD Mother   . AAA (abdominal aortic aneurysm) Mother   . Healthy Father   . Heart attack Brother    Social History   Socioeconomic History  . Marital status: Married    Spouse name: Not on file  . Number of children: 1  . Years of education: 58  . Highest education level: High school graduate  Occupational History  . Occupation: Retired  Tobacco Use  . Smoking status: Former  Smoker    Quit date: 2003    Years since quitting: 18.9  . Smokeless tobacco: Never Used  Vaping Use  . Vaping Use: Never used  Substance and Sexual Activity  . Alcohol use: No  . Drug use: Never  . Sexual activity: Not on file  Other Topics Concern  . Not on file  Social History Narrative   Lives at home with his wife.   2 cups caffeine per day.   Right-handed.   Social Determinants of Health   Financial Resource Strain:   . Difficulty of Paying Living Expenses: Not on file  Food Insecurity:   . Worried About Charity fundraiser in the Last Year: Not on file  . Ran Out of Food in the Last Year: Not on file  Transportation Needs:   . Lack of  Transportation (Medical): Not on file  . Lack of Transportation (Non-Medical): Not on file  Physical Activity:   . Days of Exercise per Week: Not on file  . Minutes of Exercise per Session: Not on file  Stress:   . Feeling of Stress : Not on file  Social Connections:   . Frequency of Communication with Friends and Family: Not on file  . Frequency of Social Gatherings with Friends and Family: Not on file  . Attends Religious Services: Not on file  . Active Member of Clubs or Organizations: Not on file  . Attends Archivist Meetings: Not on file  . Marital Status: Not on file    Tobacco Counseling Counseling given: Not Answered   Clinical Intake:  Pre-visit preparation completed: Yes  Pain : No/denies pain     Nutritional Risks: None  How often do you need to have someone help you when you read instructions, pamphlets, or other written materials from your doctor or pharmacy?: 3 - Sometimes What is the last grade level you completed in school?: 12  Diabetic yes-poor control 10.2%  Interpreter Needed?: No Readers -cataract surgery, no hearing aids   Wheelchair ramp on house-improved safety Needs handle  Activities of Daily Living In your present state of health, do you have any difficulty performing the following activities: 07/12/2020 03/23/2020  Hearing? Y N  Vision? N N  Difficulty concentrating or making decisions? Y N  Walking or climbing stairs? Y N  Dressing or bathing? Y N  Doing errands, shopping? N N  Preparing Food and eating ? N -  Using the Toilet? N -  In the past six months, have you accidently leaked urine? Y -  Do you have problems with loss of bowel control? Y -  Managing your Medications? N -  Managing your Finances? Y -  Housekeeping or managing your Housekeeping? Y -  Some recent data might be hidden    Patient Care Team: [name], MD as PCP - General (Family Medicine) Burnice Logan, [facility] as Pharmacist (Pharmacist)  Irving Copas  Eye-Diabetic eye exam Podiatrist -foot care    Assessment:   This is a routine wellness examination for [facility].   Dietary issues and exercise activities discussed: Current Exercise Habits: Home exercise routine, Type of exercise: walking, Time (Minutes): 30, Frequency (Times/Week): 6, Weekly Exercise (Minutes/Week): 180, Intensity: Mild  Goals    . Pharmacy Care Plan     CARE PLAN ENTRY (see longitudinal plan of care for additional care plan information)  Current Barriers:  . Chronic Disease Management support, education, and care coordination needs related to Hypertension, Hyperlipidemia, and Diabetes   Hypertension  BP Readings from Last 3 Encounters:  11/30/19 128/72  04/03/18 124/73  12/07/14 144/81   . Pharmacist Clinical Goal(s): o Over the next 90 days, patient will work with PharmD and providers to maintain BP goal <130/80 . Current regimen:  o Metoprolol 25 mg daily o Lisinopril 2.5 mg daily  . Interventions: o Patient's blood pressure medication is affordable per wife.  o Patient uses pill box to keep medications on time.  . Patient self care activities - Over the next 90 days, patient will: o Check BP weekly, document, and provide at future appointments o Ensure daily salt intake < 2300 mg/day  Hyperlipidemia Lab Results  Component Value Date/Time   LDLCALC 94 03/01/2020 07:44 AM   . Pharmacist Clinical Goal(s): o Over the next 90 days, patient will work with PharmD and providers to achieve LDL goal <70 . Current regimen:  o Atorvastatin 80 mg daily o Ezetemibe 10 mg daily  . Interventions: o Patient's wife reports medications affordable. Mr. Cuccia uses a pill box to take medications.   . Patient self care activities - Over the next 90 days, patient will: o Continue to take medication as prescribed.  o Contract provider or patient with any questions or concerns.   Diabetes Lab Results  Component Value Date/Time   HGBA1C 8.7 (H) 03/01/2020 07:44 AM    HGBA1C 8.1 (H) 11/30/2019 09:51 AM   . Pharmacist Clinical Goal(s): o Over the next 90 days, patient will work with PharmD and providers to achieve A1c goal <7% . Current regimen:  o Lantus 65 units daily o Janumet 50/1000 mg daily  o Glipizide XL 10 mg twice daily . Interventions: o Working to help patient get Lantus and Janumet through patient assistance. Patient was in the coverage gap last year and cost of Janumet is too high currently.  - 04/12/2020 - Patient assistance approved for Lantus and Humalog.  o Recommend patient begin checking blood sugar once daily every day.  . Patient self care activities - Over the next 90 days, patient will: o Check blood sugar once daily, document, and provide at future appointments o Contact provider with any episodes of hypoglycemia  Medication management . Pharmacist Clinical Goal(s): o Over the next 90 days, patient will work with PharmD and providers to achieve optimal medication adherence . Current pharmacy: [facility] . Interventions o Comprehensive medication review performed. o Continue current medication management strategy . Patient self care activities - Over the next 90 days, patient will: o Focus on medication adherence by continuing to use pill box.  o Take medications as prescribed o Report any questions or concerns to PharmD and/or provider(s)  Please see past updates related to this goal by clicking on the "Past Updates" button in the selected goal        Depression Screen PHQ 2/9 Scores 07/12/2020 06/28/2020 11/30/2019  PHQ - 2 Score 0 0 0    Fall Risk Fall Risk  07/12/2020 06/28/2020 11/30/2019  Falls in the past year? 0 0 1  Number falls in past yr: 0 0 0  Injury with Fall? 0 0 1  Risk for fall due to : No Fall Risks - -  Follow up - - Falls evaluation completed;Falls prevention discussed   Any stairs in or around the home? yes If so, are there any without handrails? yes Home free of loose throw rugs in  walkways, pet beds, electrical cords, etc?dog Adequate lighting in your home to reduce risk of falls?  nightlight ASSISTIVE  DEVICES UTILIZED TO PREVENT FALLS: Life alert? no Use of a cane, walker or w/c?  Grab bars in the bathroom?no Shower chair or bench in shower?no Elevated toilet seat or a handicapped toilet? no  Cognitive Function: no mental concerns-vertigo     6CIT Screen 07/12/2020  What Year? 0 points  What month? 0 points  What time? 0 points  Count back from 20 0 points  Months in reverse 0 points  Repeat phrase 0 points  Total Score 0    Immunizations Immunization History  Administered Date(s) Administered  . Fluad Quad(high Dose 65+) 06/28/2020  . PFIZER SARS-COV-2 Vaccination 12/10/2019, 01/08/2020  . Pneumococcal Polysaccharide-23 06/28/2020     Qualifies for Shingles Vaccine Screening Tests Health Maintenance  Topic Date Due  . Hepatitis C Screening  Never done  . OPHTHALMOLOGY EXAM  Never done  . TETANUS/TDAP  Never done  . COLONOSCOPY  Never done  . HEMOGLOBIN A1C  12/26/2020  . FOOT EXAM  06/28/2021  . PNA vac Low Risk Adult (2 of 2 - PCV13) 06/28/2021  . INFLUENZA VACCINE  Completed  . COVID-19 Vaccine  Completed    Health Maintenance  Health Maintenance Due  Topic Date Due  . Hepatitis C Screening  Never done  . OPHTHALMOLOGY EXAM  Never done  . TETANUS/TDAP  Never done  . COLONOSCOPY  Never done    Vision Screening: Recommended annual ophthalmology exams for early detection of glaucoma and other disorders of the eye.[facility] Is the patient up to date with their annual eye exam?  yes Who is the provider or what is the name of the office in which the patient attends annual eye exams? [place] If pt is not established with a provider, would they like to be referred to a provider to establish care? yearly  Dental Screening: Recommended annual dental exams for proper oral hygiene-no recent exam-dentures    Plan:    I  have personally reviewed and noted the following in the patient's chart:   . Medical and social history . Use of alcohol, tobacco or illicit drugs  . Current medications and supplements . Functional ability and status . Nutritional status . Physical activity . Advanced directives . List of other physicians . Hospitalizations, surgeries, and ER visits in previous 12 months . Vitals . Screenings to include cognitive, depression, and falls . Referrals and appointments  In addition, I have reviewed and discussed with patient certain preventive protocols, quality metrics, and best practice recommendations. A written personalized care plan for preventive services as well as general preventive health recommendations were provided to patient.     Mertha Baars, MD   07/12/2020

## 2020-07-12 NOTE — Chronic Care Management (AMB) (Signed)
    Chronic Care Management Pharmacy Assistant   Name: TREYDEN HAKIM  MRN: [id number] DOB: August 16, 1950  Reason for Encounter: Medication Review  .  PCP : [name], MD  Allergies:  No Known Allergies  Medications: Outpatient Encounter Medications as of 07/12/2020  Medication Sig  . atorvastatin (LIPITOR) 80 MG tablet TAKE ONE (1) TABLET ONCE DAILY  . Continuous Blood Gluc Receiver (FREESTYLE LIBRE 2 READER) DEVI Check qac and qhs  . Continuous Blood Gluc Sensor (FREESTYLE LIBRE 2 SENSOR) MISC 1 each by Does not apply route every 14 (fourteen) days.  Marland Kitchen ezetimibe (ZETIA) 10 MG tablet TAKE ONE (1) TABLET ONCE DAILY  . gabapentin (NEURONTIN) 300 MG capsule TAKE ONE (1) CAPSULE THREE (3) TIMES EACH DAY  . glipiZIDE (GLUCOTROL XL) 10 MG 24 hr tablet TAKE ONE TABLET TWICE DAILY  . insulin lispro (HUMALOG) 100 UNIT/ML KwikPen Inject 4 Units into the skin 2 (two) times daily before a meal. Sliding scale given to patient. Max humalog is 20 U.  Marland Kitchen JANUMET 50-1000 MG tablet TAKE ONE (1) TABLET BY MOUTH ONCE DAILY  . LANTUS SOLOSTAR 100 UNIT/ML Solostar Pen INJECT 60 UNITS ONCE DAILY IN THE EVENING. (Patient taking differently: Inject 65 Units into the skin daily. )  . lisinopril (ZESTRIL) 2.5 MG tablet TAKE ONE (1) TABLET ONCE DAILY  . meclizine (ANTIVERT) 25 MG tablet Take 1 tablet (25 mg total) by mouth 3 (three) times daily as needed for dizziness.  . metoprolol succinate (TOPROL-XL) 25 MG 24 hr tablet TAKE ONE (1) TABLET ONCE DAILY  . ofloxacin (OCUFLOX) 0.3 % ophthalmic solution  (Patient not taking: Reported on 03/22/2020)  . omega-3 acid ethyl esters (LOVAZA) 1 g capsule Take 2 capsules (2 g total) by mouth 2 (two) times daily.  Marland Kitchen zolpidem (AMBIEN) 10 MG tablet Take 10 mg by mouth at bedtime.    No facility-administered encounter medications on file as of 07/12/2020.    Current Diagnosis: Patient Active Problem List   Diagnosis Date Noted  . Combined hyperlipidemia associated with  type 2 diabetes mellitus ([place]) 06/28/2020  . Mixed hyperlipidemia 11/30/2019  . Controlled type 2 diabetes mellitus with complication, with long-term current use of insulin ([place]) 11/30/2019  . Hypertensive heart disease without heart failure 11/30/2019  . BMI 31.0-31.9,adult 11/30/2019  . Chronic left shoulder pain 11/30/2019  . 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 Obstructive sleep apnea 04/03/2018  . Cerebrovascular accident (CVA) ([place]) 04/03/2018  . Benign paroxysmal positional vertigo 04/03/2018  . Atherosclerosis of native coronary artery of native heart without angina pectoris 03/28/2018  . Lacunar infarction ([place]) 03/28/2018  . Old myocardial infarction 03/28/2018  . Essential (primary) hypertension 10/31/2015      Follow-Up:  Patient [place], for progress on patient's assistance form, for his Janumet. Per representative they had mailed patient an [place] letter to be filled out and returned back to [employer], In August ,2021 and they have not received back yet .  Per Elly Modena. Owens Shark , check with  patient to see if he had received form. If he did received form, has form been filled out and mailed back to [employer].  Called Patient's home number Left message for him to call me back.  Called cell number unable to leave message.  Elly Modena Owens Shark, [place] notified  Judithann Sheen, [facility] Clinical Pharmacist Assistant [phone]

## 2020-07-13 ENCOUNTER — Other Ambulatory Visit: Payer: Self-pay

## 2020-07-14 ENCOUNTER — Telehealth: Payer: Self-pay

## 2020-07-14 ENCOUNTER — Other Ambulatory Visit: Payer: Self-pay

## 2020-07-14 NOTE — Chronic Care Management (AMB) (Addendum)
Chronic Care Management Pharmacy Assistant   Name: Joshua Nelson  MRN: [id number] DOB: Sep 28, 1950  Reason for Encounter: Medication Review    PCP : [name], MD  Allergies:  No Known Allergies  Medications: Outpatient Encounter Medications as of 07/14/2020  Medication Sig   atorvastatin (LIPITOR) 80 MG tablet TAKE ONE (1) TABLET ONCE DAILY   Continuous Blood Gluc Receiver (FREESTYLE LIBRE 2 READER) DEVI Check qac and qhs   Continuous Blood Gluc Sensor (FREESTYLE LIBRE 2 SENSOR) MISC 1 each by Does not apply route every 14 (fourteen) days.   ezetimibe (ZETIA) 10 MG tablet TAKE ONE (1) TABLET ONCE DAILY   gabapentin (NEURONTIN) 300 MG capsule TAKE ONE (1) CAPSULE THREE (3) TIMES EACH DAY   glipiZIDE (GLUCOTROL XL) 10 MG 24 hr tablet TAKE ONE TABLET TWICE DAILY   insulin lispro (HUMALOG) 100 UNIT/ML KwikPen Inject 4 Units into the skin 2 (two) times daily before a meal. Sliding scale given to patient. Max humalog is 20 U.   JANUMET 50-1000 MG tablet TAKE ONE (1) TABLET BY MOUTH ONCE DAILY   LANTUS SOLOSTAR 100 UNIT/ML Solostar Pen INJECT 60 UNITS ONCE DAILY IN THE EVENING. (Patient taking differently: Inject 65 Units into the skin daily. )   lisinopril (ZESTRIL) 2.5 MG tablet TAKE ONE (1) TABLET ONCE DAILY   meclizine (ANTIVERT) 25 MG tablet Take 1 tablet (25 mg total) by mouth 3 (three) times daily as needed for dizziness.   metoprolol succinate (TOPROL-XL) 25 MG 24 hr tablet TAKE ONE (1) TABLET ONCE DAILY   ofloxacin (OCUFLOX) 0.3 % ophthalmic solution    omega-3 acid ethyl esters (LOVAZA) 1 g capsule Take 2 capsules (2 g total) by mouth 2 (two) times daily.   zolpidem (AMBIEN) 10 MG tablet Take 10 mg by mouth at bedtime.    No facility-administered encounter medications on file as of 07/14/2020.    Current Diagnosis: Patient Active Problem List   Diagnosis Date Noted   Combined hyperlipidemia associated with type 2 diabetes mellitus ([place]) 06/28/2020   Mixed  hyperlipidemia 11/30/2019   Controlled type 2 diabetes mellitus with complication, with long-term current use of insulin ([place]) 11/30/2019   Hypertensive heart disease without heart failure 11/30/2019   BMI 31.0-31.9,adult 11/30/2019   Chronic left shoulder pain 11/30/2019   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   Obstructive sleep apnea 04/03/2018   Cerebrovascular accident (CVA) ([place]) 04/03/2018   Benign paroxysmal positional vertigo 04/03/2018   Atherosclerosis of native coronary artery of native heart without angina pectoris 03/28/2018   Lacunar infarction ([place]) 03/28/2018   Old myocardial infarction 03/28/2018   Essential (primary) hypertension 10/31/2015      Follow-Up:  Pharmacist Review - Lisinopril medication adherence 70.0%  Called patient to see if he was still taking as directed by provider . Per Wife, states that patient is now taking his lisinopril, he was out for a short time. Also states that patient needed a refill for his Humalog sent to [facility], she states she is okay with paying for this medication. Send note in teams to [place]. Owens Shark.about refill. Also ask her if she had called patient assistance about patient's attestation letter, she states she has not at this time. Called patient back, spoke to wife she said that she has not heard anything form patient assistance for his Humalog,  Lilly is delivering Humalog 07/15/2020. Called patient and left a message.   Elly ModenaOwens Shark, [place] notified Judithann Sheen, [facility] Clinical Pharmacist Assistant [phone]

## 2020-07-15 MED ORDER — INSULIN LISPRO (1 UNIT DIAL) 100 UNIT/ML (KWIKPEN)
4.0000 [IU] | PEN_INJECTOR | Freq: Two times a day (BID) | SUBCUTANEOUS | 3 refills | Status: DC
Start: 2020-07-15 — End: 2020-12-21

## 2020-07-15 MED ORDER — LANTUS SOLOSTAR 100 UNIT/ML ~~LOC~~ SOPN: 65.0000 [IU] | PEN_INJECTOR | Freq: Every day | SUBCUTANEOUS | 0 refills | Status: DC

## 2020-07-29 DIAGNOSIS — E1165 Type 2 diabetes mellitus with hyperglycemia: Secondary | ICD-10-CM | POA: Diagnosis not present

## 2020-07-29 DIAGNOSIS — E118 Type 2 diabetes mellitus with unspecified complications: Secondary | ICD-10-CM | POA: Diagnosis not present

## 2020-08-09 ENCOUNTER — Encounter: Payer: Self-pay | Admitting: Family Medicine

## 2020-08-22 ENCOUNTER — Other Ambulatory Visit: Payer: Self-pay | Admitting: Physician Assistant

## 2020-08-31 ENCOUNTER — Telehealth: Payer: Self-pay

## 2020-08-31 NOTE — Progress Notes (Signed)
    Chronic Care Management Pharmacy Assistant   Name: Joshua Nelson  MRN: [id number] DOB: 02-28-1951  Reason for Encounter: Follow up in [place] PAP form  PCP : [name], MD  Allergies:  No Known Allergies  Medications: Outpatient Encounter Medications as of 08/31/2020  Medication Sig  . atorvastatin (LIPITOR) 80 MG tablet TAKE ONE (1) TABLET ONCE DAILY  . Continuous Blood Gluc Receiver (FREESTYLE LIBRE 2 READER) DEVI Check qac and qhs  . Continuous Blood Gluc Sensor (FREESTYLE LIBRE 2 SENSOR) MISC 1 each by Does not apply route every 14 (fourteen) days.  Marland Kitchen ezetimibe (ZETIA) 10 MG tablet TAKE ONE (1) TABLET ONCE DAILY  . gabapentin (NEURONTIN) 300 MG capsule TAKE ONE (1) CAPSULE THREE (3) TIMES EACH DAY  . glipiZIDE (GLUCOTROL XL) 10 MG 24 hr tablet TAKE ONE TABLET TWICE DAILY  . insulin lispro (HUMALOG) 100 UNIT/ML KwikPen Inject 4 Units into the skin 2 (two) times daily before a meal. Sliding scale given to patient. Max humalog is 20 U.  Marland Kitchen JANUMET 50-1000 MG tablet TAKE ONE (1) TABLET BY MOUTH ONCE DAILY  . LANTUS SOLOSTAR 100 UNIT/ML Solostar Pen Inject 65 Units into the skin daily.  Marland Kitchen lisinopril (ZESTRIL) 2.5 MG tablet TAKE ONE (1) TABLET ONCE DAILY  . meclizine (ANTIVERT) 25 MG tablet Take 1 tablet (25 mg total) by mouth 3 (three) times daily as needed for dizziness.  . metoprolol succinate (TOPROL-XL) 25 MG 24 hr tablet TAKE ONE (1) TABLET ONCE DAILY  . ofloxacin (OCUFLOX) 0.3 % ophthalmic solution   . omega-3 acid ethyl esters (LOVAZA) 1 g capsule Take 2 capsules (2 g total) by mouth 2 (two) times daily.  Marland Kitchen zolpidem (AMBIEN) 10 MG tablet Take 10 mg by mouth at bedtime.    No facility-administered encounter medications on file as of 08/31/2020.    Current Diagnosis: Patient Active Problem List   Diagnosis Date Noted  . Combined hyperlipidemia associated with type 2 diabetes mellitus ([place]) 06/28/2020  . Mixed hyperlipidemia 11/30/2019  . Controlled type 2 diabetes  mellitus with complication, with long-term current use of insulin ([place]) 11/30/2019  . Hypertensive heart disease without heart failure 11/30/2019  . BMI 31.0-31.9,adult 11/30/2019  . Chronic left shoulder pain 11/30/2019  . 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 Obstructive sleep apnea 04/03/2018  . Cerebrovascular accident (CVA) ([employer]) 04/03/2018  . Benign paroxysmal positional vertigo 04/03/2018  . Atherosclerosis of native coronary artery of native heart without angina pectoris 03/28/2018  . Lacunar infarction ([place]) 03/28/2018  . Old myocardial infarction 03/28/2018  . Essential (primary) hypertension 10/31/2015   Left a message for the patient or his wife to call back with the status update for his Janumet, after reviewing the chart it looks like the process was never completed by the patient.    Previous messages have been left and the last contact with the patients wife, she stated she had not completed the form for assistance as of 07/14/20.   Follow-Up:  Patient Assistance Coordination and Pharmacist Review  Donette Larry, CPP notified  Clarita Leber, [place] Pharmacist Assistant [phone]

## 2020-09-05 DIAGNOSIS — R339 Retention of urine, unspecified: Secondary | ICD-10-CM | POA: Diagnosis not present

## 2020-09-05 DIAGNOSIS — N401 Enlarged prostate with lower urinary tract symptoms: Secondary | ICD-10-CM | POA: Diagnosis not present

## 2020-09-05 DIAGNOSIS — Z79899 Other long term (current) drug therapy: Secondary | ICD-10-CM | POA: Diagnosis not present

## 2020-09-05 DIAGNOSIS — R32 Unspecified urinary incontinence: Secondary | ICD-10-CM | POA: Diagnosis not present

## 2020-09-30 ENCOUNTER — Ambulatory Visit: Payer: Medicare HMO | Admitting: Family Medicine

## 2020-10-04 ENCOUNTER — Other Ambulatory Visit: Payer: Self-pay | Admitting: Family Medicine

## 2020-10-14 NOTE — Progress Notes (Signed)
Subjective:  Patient ID: Joshua Nelson, male    DOB: Aug 16, 1950  Age: 70 y.o. MRN: [id number]  Chief Complaint  Patient presents with  . Hyperlipidemia  . Diabetes    HPI  Hypertensive heart disease without heart failure. History of stroke.  Taking metoprolol and lisinopril, needs refills.  Combined hyperlipidemia associated with type 2 diabetes mellitus (HCC) Takes lipitor, zetia, lovaza, lantus 65 U daily, glipizide xl 10 mg one twice a day,  and humalog 4 U before two meals per day, janumet 50/1000 mg once daily. Checks blood sugar (CGM) and feet daily. States he has difficulty keeping CGM on his arm. Sugars 150s.  Has hypoglycemia once a month. Sugar will be less than 50. Daily foot checks. Pt saw an eye doctor.   Bladder incontinence: Given a bladder medicine which did not help by urology. Is scheduled to return.    Current Outpatient Medications on File Prior to Visit  Medication Sig Dispense Refill  . atorvastatin (LIPITOR) 80 MG tablet TAKE ONE (1) TABLET ONCE DAILY 90 tablet 0  . Continuous Blood Gluc Receiver (FREESTYLE LIBRE 2 READER) DEVI Check qac and qhs 1 each 0  . Continuous Blood Gluc Sensor (FREESTYLE LIBRE 2 SENSOR) MISC 1 each by Does not apply route every 14 (fourteen) days. 6 each 3  . ezetimibe (ZETIA) 10 MG tablet TAKE ONE (1) TABLET ONCE DAILY 30 tablet 2  . glipiZIDE (GLUCOTROL XL) 10 MG 24 hr tablet TAKE ONE TABLET TWICE DAILY 180 tablet 0  . insulin lispro (HUMALOG) 100 UNIT/ML KwikPen Inject 4 Units into the skin 2 (two) times daily before a meal. Sliding scale given to patient. Max humalog is 20 U. 15 mL 3  . JANUMET 50-1000 MG tablet TAKE ONE (1) TABLET BY MOUTH ONCE DAILY 90 tablet 1  . LANTUS SOLOSTAR 100 UNIT/ML Solostar Pen Inject 65 Units into the skin daily. 60 mL 0  . meclizine (ANTIVERT) 25 MG tablet Take 1 tablet (25 mg total) by mouth 3 (three) times daily as needed for dizziness. 30 tablet 2  . ofloxacin (OCUFLOX) 0.3 % ophthalmic solution      . omega-3 acid ethyl esters (LOVAZA) 1 g capsule Take 2 capsules (2 g total) by mouth 2 (two) times daily. 360 capsule 0  . zolpidem (AMBIEN) 10 MG tablet Take 10 mg by mouth at bedtime.      No current facility-administered medications on file prior to visit.   Past Medical History:  Diagnosis Date  . Coronary artery disease   . CVA (cerebral vascular accident) ([place])   . Diabetes mellitus without complication ([place])   . Hypertension   . Insomnia   . Myocardial infarct [facility])    Past Surgical History:  Procedure Laterality Date  . heart stents     x 3  . left cataract  10/2019  . right cataract Right 08/2019    Family History  Problem Relation Age of Onset  . Endometrial cancer Mother   . CAD Mother   . AAA (abdominal aortic aneurysm) Mother   . Healthy Father   . Heart attack Brother    Social History   Socioeconomic History  . Marital status: Married    Spouse name: Not on file  . Number of children: 1  . Years of education: 72  . Highest education level: High school graduate  Occupational History  . Occupation: Retired  Tobacco Use  . Smoking status: Former Smoker    Quit date: 2003  Years since quitting: 19.1  . Smokeless tobacco: Never Used  Vaping Use  . Vaping Use: Never used  Substance and Sexual Activity  . Alcohol use: No  . Drug use: Never  . Sexual activity: Not on file  Other Topics Concern  . Not on file  Social History Narrative   Lives at home with his wife.   2 cups caffeine per day.   Right-handed.   Social Determinants of Health   Financial Resource Strain: Not on file  Food Insecurity: Not on file  Transportation Needs: Not on file  Physical Activity: Not on file  Stress: Not on file  Social Connections: Not on file    Review of Systems  Constitutional: Negative for chills, diaphoresis, fatigue and fever.  HENT: Negative for congestion, ear pain and sore throat.   Respiratory: Negative for cough and shortness of breath.    Cardiovascular: Negative for chest pain and leg swelling.  Gastrointestinal: Negative for abdominal pain, constipation, diarrhea, nausea and vomiting.  Endocrine: Positive for polydipsia and polyuria. Negative for polyphagia.  Genitourinary: Negative for dysuria and urgency.  Musculoskeletal: Positive for arthralgias (hands and wrists hurt BL. Right> left. Dropping items. ). Negative for myalgias.  Allergic/Immunologic: Positive for environmental allergies.  Neurological: Negative for dizziness and headaches.  Psychiatric/Behavioral: Negative for dysphoric mood.     Objective:  BP 136/70   Pulse 91   Temp (!) 97.1 F (36.2 C)   Ht 5\' 7"  (1.702 m)   Wt 226 lb (102.5 kg)   SpO2 100%   BMI 35.40 kg/m   BP/Weight 10/17/2020 07/12/2020 63/78/5885  Systolic BP 027 741 287  Diastolic BP 70 64 72  Wt. (Lbs) 226 223.2 221.4  BMI 35.4 32.96 31.77    Physical Exam Vitals reviewed.  Constitutional:      Appearance: Normal appearance. He is normal weight.  Neck:     Vascular: No carotid bruit.  Cardiovascular:     Rate and Rhythm: Normal rate and regular rhythm.     Heart sounds: No murmur heard.   Pulmonary:     Effort: Pulmonary effort is normal.     Breath sounds: Normal breath sounds.  Abdominal:     General: Abdomen is flat. Bowel sounds are normal.     Palpations: Abdomen is soft.     Tenderness: There is no abdominal tenderness.  Musculoskeletal:     Comments: Positive phalen's and tinels BL.   Neurological:     Mental Status: He is alert and oriented to person, place, and time.  Psychiatric:        Mood and Affect: Mood normal.        Behavior: Behavior normal.     Diabetic Foot Exam - Simple   Simple Foot Form Diabetic Foot exam was performed with the following findings: Yes 10/17/2020  9:37 AM  Visual Inspection See comments: Yes Sensation Testing Intact to touch and monofilament testing bilaterally: Yes Pulse Check Posterior Tibialis and Dorsalis pulse  intact bilaterally: Yes Comments Calluses on top of feet in creases between toes.       Lab Results  Component Value Date   WBC 9.5 06/28/2020   HGB 13.8 06/28/2020   HCT 41.0 06/28/2020   PLT 186 06/28/2020   GLUCOSE 362 (H) 06/28/2020   CHOL 132 06/28/2020   TRIG 209 (H) 06/28/2020   HDL 24 (L) 06/28/2020   LDLCALC 73 06/28/2020   ALT 20 06/28/2020   AST 16 06/28/2020   NA 136 06/28/2020  K 4.9 06/28/2020   CL 97 06/28/2020   CREATININE 0.84 06/28/2020   BUN 12 06/28/2020   CO2 23 06/28/2020   TSH 0.687 06/28/2020   HGBA1C 10.2 (H) 06/28/2020   MICROALBUR 10 11/30/2019      Assessment & Plan:   1. Hypertensive heart disease without heart failure Well controlled.  No changes to medicines.  Continue to work on eating a healthy diet and exercise.  Labs drawn today.  - Comprehensive metabolic panel  2. Combined hyperlipidemia associated with type 2 diabetes mellitus ([place]) Control: poor Recommend check sugars fasting daily. Recommend pt gets band Recommend check feet daily. Recommend annual eye exams. Medicines: await labs.  Continue to work on eating a healthy diet and exercise.  Labs drawn today.   - Lipid panel - CBC with Differential/Platelet - Hemoglobin A1c - POCT UA - Microalbumin  3. Colon cancer screening - Ambulatory referral to Gastroenterology  4. Mixed hyperlipidemia Low fat diet. Recommend continued exercise.  The current medical regimen is effective;  continue present plan and medications. - FLP  5. Bilateral carpal tunnel syndrome Meloxicam 15 mg daily. (no goodies while taking meloxicam) Wrist braces   6. Aortic atherosclerosis ([place])  Recommend continue to work on eating healthy diet and exercise. Statin and aspirin.  7. Morbid obesity, BMI 35 with 2 serious comorbidities Recommend continue to work on eating healthy diet and exercise.  Meds ordered this encounter  Medications  . gabapentin (NEURONTIN) 300 MG capsule    Sig:  TAKE ONE (1) CAPSULE THREE (3) TIMES EACH DAY    Dispense:  270 capsule    Refill:  2  . lisinopril (ZESTRIL) 2.5 MG tablet    Sig: TAKE ONE (1) TABLET ONCE DAILY    Dispense:  90 tablet    Refill:  0  . metoprolol succinate (TOPROL-XL) 25 MG 24 hr tablet    Sig: TAKE ONE (1) TABLET ONCE DAILY    Dispense:  90 tablet    Refill:  0  . meloxicam (MOBIC) 15 MG tablet    Sig: Take 1 tablet (15 mg total) by mouth daily.    Dispense:  30 tablet    Refill:  0    Orders Placed This Encounter  Procedures  . Lipid panel  . CBC with Differential/Platelet  . Hemoglobin A1c  . Comprehensive metabolic panel  . Ambulatory referral to Gastroenterology  . POCT UA - Microalbumin     Follow-up: Return in about 3 months (around 01/17/2021).  An After Visit Summary was printed and given to the patient.  [name], MD [facility] [phone]

## 2020-10-17 ENCOUNTER — Encounter: Payer: Self-pay | Admitting: Family Medicine

## 2020-10-17 ENCOUNTER — Other Ambulatory Visit: Payer: Self-pay

## 2020-10-17 ENCOUNTER — Ambulatory Visit (INDEPENDENT_AMBULATORY_CARE_PROVIDER_SITE_OTHER): Payer: Medicare HMO | Admitting: Family Medicine

## 2020-10-17 VITALS — BP 136/70 | HR 91 | Temp 97.1°F | Ht 67.0 in | Wt 226.0 lb

## 2020-10-17 DIAGNOSIS — Z6835 Body mass index (BMI) 35.0-35.9, adult: Secondary | ICD-10-CM

## 2020-10-17 DIAGNOSIS — E1169 Type 2 diabetes mellitus with other specified complication: Secondary | ICD-10-CM

## 2020-10-17 DIAGNOSIS — E782 Mixed hyperlipidemia: Secondary | ICD-10-CM

## 2020-10-17 DIAGNOSIS — I119 Hypertensive heart disease without heart failure: Secondary | ICD-10-CM | POA: Diagnosis not present

## 2020-10-17 DIAGNOSIS — I700 Atherosclerosis of aorta: Secondary | ICD-10-CM

## 2020-10-17 DIAGNOSIS — N401 Enlarged prostate with lower urinary tract symptoms: Secondary | ICD-10-CM | POA: Diagnosis not present

## 2020-10-17 DIAGNOSIS — Z1211 Encounter for screening for malignant neoplasm of colon: Secondary | ICD-10-CM

## 2020-10-17 DIAGNOSIS — G5603 Carpal tunnel syndrome, bilateral upper limbs: Secondary | ICD-10-CM | POA: Diagnosis not present

## 2020-10-17 DIAGNOSIS — R3911 Hesitancy of micturition: Secondary | ICD-10-CM

## 2020-10-17 LAB — POCT UA - MICROALBUMIN: Microalbumin Ur, POC: 10 mg/L

## 2020-10-17 MED ORDER — MELOXICAM 15 MG PO TABS: 15.0000 mg | ORAL_TABLET | Freq: Every day | ORAL | 0 refills | Status: DC

## 2020-10-17 MED ORDER — METOPROLOL SUCCINATE ER 25 MG PO TB24
ORAL_TABLET | ORAL | 0 refills | Status: DC
Start: 2020-10-17 — End: 2021-02-08

## 2020-10-17 MED ORDER — GABAPENTIN 300 MG PO CAPS
ORAL_CAPSULE | ORAL | 2 refills | Status: DC
Start: 2020-10-17 — End: 2021-08-21

## 2020-10-17 MED ORDER — LISINOPRIL 2.5 MG PO TABS: ORAL_TABLET | ORAL | 0 refills | Status: DC

## 2020-10-17 NOTE — Patient Instructions (Signed)
Diabetes: try special bandages for free style libre.   Meloxicam: 15 mg once daily.  Use braces as much as possible. If no improvement, consider nerve study of hands.   Carpal Tunnel Syndrome  Carpal tunnel syndrome is a condition that causes pain, weakness, and numbness in your hand and arm. Numbness is when you cannot feel an area in your body. The carpal tunnel is a narrow area that is on the palm side of your wrist. Repeated wrist motion or certain diseases may cause swelling in the tunnel. This swelling can pinch the main nerve in the wrist. This nerve is called the median nerve. What are the causes? This condition may be caused by:  Moving your hand and wrist over and over again while doing a task.  Injury to the wrist.  Arthritis.  A sac of fluid (cyst) or abnormal growth (tumor) in the carpal tunnel.  Fluid buildup during pregnancy.  Use of tools that vibrate. Sometimes the cause is not known. What increases the risk? The following factors may make you more likely to have this condition:  Having a job that makes you do these things: ? Move your hand over and over again. ? Work with tools that vibrate, such as drills or sanders.  Being a woman.  Having diabetes, obesity, thyroid problems, or kidney failure. What are the signs or symptoms? Symptoms of this condition include:  A tingling feeling in your fingers.  Tingling or loss of feeling in your hand.  Pain in your entire arm. This pain may get worse when you bend your wrist and elbow for a long time.  Pain in your wrist that goes up your arm to your shoulder.  Pain that goes down into your palm or fingers.  Weakness in your hands. You may find it hard to grab and hold items. You may feel worse at night. How is this treated? This condition may be treated with:  Lifestyle changes. You will be asked to stop or change the activity that caused your problem.  Doing exercises and activities that make bones,  muscles, and tendons stronger (physical therapy).  Learning how to use your hand again (occupational therapy).  Medicines for pain and swelling. You may have injections in your wrist.  A wrist splint or brace.  Surgery. Follow these instructions at home: If you have a splint or brace:  Wear the splint or brace as told by your doctor. Take it off only as told by your doctor.  Loosen the splint if your fingers: ? Tingle. ? Become numb. ? Turn cold and blue.  Keep the splint or brace clean.  If the splint or brace is not waterproof: ? Do not let it get wet. ? Cover it with a watertight covering when you take a bath or a shower. Managing pain, stiffness, and swelling If told, put ice on the painful area:  If you have a removable splint or brace, remove it as told by your doctor.  Put ice in a plastic bag.  Place a towel between your skin and the bag.  Leave the ice on for 20 minutes, 2-3 times per day. Do not fall asleep with the cold pack on your skin.  Take off the ice if your skin turns bright red. This is very important. If you cannot feel pain, heat, or cold, you have a greater risk of damage to the area. Move your fingers often to reduce stiffness and swelling.   General instructions  Take over-the-counter  and prescription medicines only as told by your doctor.  Rest your wrist from any activity that may cause pain. If needed, talk with your boss at work about changes that can help your wrist heal.  Do exercises as told by your doctor, physical therapist, or occupational therapist.  Keep all follow-up visits. Contact a doctor if:  You have new symptoms.  Medicine does not help your pain.  Your symptoms get worse. Get help right away if:  You have very bad numbness or tingling in your wrist or hand. Summary  Carpal tunnel syndrome is a condition that causes pain in your hand and arm.  It is often caused by repeated wrist motions.  Lifestyle changes and  medicines are used to treat this problem. Surgery may help in very bad cases.  Follow your doctor's instructions about wearing a splint, resting your wrist, keeping follow-up visits, and calling for help. This information is not intended to replace advice given to you by your health care provider. Make sure you discuss any questions you have with your health care provider. Document Revised: 12/10/2019 Document Reviewed: 12/10/2019 Elsevier Patient Education  [place].

## 2020-10-18 LAB — COMPREHENSIVE METABOLIC PANEL
ALT: 20 IU/L (ref 0–44)
AST: 18 IU/L (ref 0–40)
Albumin/Globulin Ratio: 1.8 (ref 1.2–2.2)
Albumin: 4.5 g/dL (ref 3.8–4.8)
Alkaline Phosphatase: 69 IU/L (ref 44–121)
BUN/Creatinine Ratio: 13 (ref 10–24)
BUN: 9 mg/dL (ref 8–27)
Bilirubin Total: 0.6 mg/dL (ref 0.0–1.2)
CO2: 24 mmol/L (ref 20–29)
Calcium: 9.5 mg/dL (ref 8.6–10.2)
Chloride: 100 mmol/L (ref 96–106)
Creatinine, Ser: 0.71 mg/dL — ABNORMAL LOW (ref 0.76–1.27)
Globulin, Total: 2.5 g/dL (ref 1.5–4.5)
Glucose: 164 mg/dL — ABNORMAL HIGH (ref 65–99)
Potassium: 4.2 mmol/L (ref 3.5–5.2)
Sodium: 139 mmol/L (ref 134–144)
Total Protein: 7 g/dL (ref 6.0–8.5)
eGFR: 101 mL/min/{1.73_m2} (ref >59)

## 2020-10-18 LAB — CBC WITH DIFFERENTIAL/PLATELET
Basophils Absolute: 0.1 10*3/uL (ref 0.0–0.2)
Basos: 1 %
EOS (ABSOLUTE): 0.3 10*3/uL (ref 0.0–0.4)
Eos: 4 %
Hematocrit: 39.9 % (ref 37.5–51.0)
Hemoglobin: 13.4 g/dL (ref 13.0–17.7)
Immature Grans (Abs): 0.1 10*3/uL (ref 0.0–0.1)
Immature Granulocytes: 1 %
Lymphocytes Absolute: 1.9 10*3/uL (ref 0.7–3.1)
Lymphs: 22 %
MCH: 29.3 pg (ref 26.6–33.0)
MCHC: 33.6 g/dL (ref 31.5–35.7)
MCV: 87 fL (ref 79–97)
Monocytes Absolute: 0.6 10*3/uL (ref 0.1–0.9)
Monocytes: 6 %
Neutrophils Absolute: 5.9 10*3/uL (ref 1.4–7.0)
Neutrophils: 66 %
Platelets: 170 10*3/uL (ref 150–450)
RBC: 4.58 x10E6/uL (ref 4.14–5.80)
RDW: 13 % (ref 11.6–15.4)
WBC: 8.9 10*3/uL (ref 3.4–10.8)

## 2020-10-18 LAB — LIPID PANEL
Chol/HDL Ratio: 4.8 ratio (ref 0.0–5.0)
Cholesterol, Total: 150 mg/dL (ref 100–199)
HDL: 31 mg/dL — ABNORMAL LOW (ref >39)
LDL Chol Calc (NIH): 96 mg/dL (ref 0–99)
Triglycerides: 129 mg/dL (ref 0–149)
VLDL Cholesterol Cal: 23 mg/dL (ref 5–40)

## 2020-10-18 LAB — CARDIOVASCULAR RISK ASSESSMENT

## 2020-10-18 LAB — HEMOGLOBIN A1C
Est. average glucose Bld gHb Est-mCnc: 226 mg/dL
Hgb A1c MFr Bld: 9.5 % — ABNORMAL HIGH (ref 4.8–5.6)

## 2020-10-19 ENCOUNTER — Other Ambulatory Visit: Payer: Self-pay

## 2020-10-19 ENCOUNTER — Telehealth: Payer: Self-pay

## 2020-10-19 DIAGNOSIS — Z125 Encounter for screening for malignant neoplasm of prostate: Secondary | ICD-10-CM

## 2020-10-19 DIAGNOSIS — R32 Unspecified urinary incontinence: Secondary | ICD-10-CM | POA: Diagnosis not present

## 2020-10-19 DIAGNOSIS — N401 Enlarged prostate with lower urinary tract symptoms: Secondary | ICD-10-CM | POA: Diagnosis not present

## 2020-10-19 DIAGNOSIS — R81 Glycosuria: Secondary | ICD-10-CM | POA: Diagnosis not present

## 2020-10-19 DIAGNOSIS — R339 Retention of urine, unspecified: Secondary | ICD-10-CM | POA: Diagnosis not present

## 2020-10-19 NOTE — Telephone Encounter (Signed)
Judson Roch from [facility] left VM stating pt has continued to have urinary urgency and leakage, she has dealt with retention. She gave Tamsulosin for retention. Pt has 4+ sugar in urine. She believes this is why he continues to have the urgency and leakage problem and will need to get it under control. Pt is currently on janumet and glipizide, stated by sarah. She will be sending a note as well but wanted to leave message also.   Royce Macadamia, [place] 10/19/20 10:11 AM

## 2020-10-19 NOTE — Telephone Encounter (Signed)
I adjusted his insulin in his lab note.  Ask him to keep track of his sugars checking them in am daily and drop off log to the office in 30 days. Kc

## 2020-10-21 LAB — PSA: Prostate Specific Ag, Serum: 1.9 ng/mL (ref 0.0–4.0)

## 2020-10-21 LAB — SPECIMEN STATUS REPORT

## 2020-10-24 ENCOUNTER — Other Ambulatory Visit: Payer: Self-pay

## 2020-10-24 MED ORDER — GLIPIZIDE ER 10 MG PO TB24: 10.0000 mg | ORAL_TABLET | Freq: Two times a day (BID) | ORAL | 0 refills | Status: DC

## 2020-11-14 ENCOUNTER — Telehealth: Payer: Self-pay

## 2020-11-14 NOTE — Progress Notes (Signed)
    Chronic Care Management Pharmacy Assistant   Name: Joshua Nelson  MRN: [id number] DOB: October 16, 1950   Reason for Encounter: Disease State for diabetes   Recent office visits:  10/17/20-PCP-start Meloxicam 15mg    Recent consult visits:  None  Hospital visits:  None in previous 6 months  Medications: Outpatient Encounter Medications as of 11/14/2020  Medication Sig  . atorvastatin (LIPITOR) 80 MG tablet TAKE ONE (1) TABLET ONCE DAILY  . Continuous Blood Gluc Receiver (FREESTYLE LIBRE 2 READER) DEVI Check qac and qhs  . Continuous Blood Gluc Sensor (FREESTYLE LIBRE 2 SENSOR) MISC 1 each by Does not apply route every 14 (fourteen) days.  Marland Kitchen ezetimibe (ZETIA) 10 MG tablet TAKE ONE (1) TABLET ONCE DAILY  . gabapentin (NEURONTIN) 300 MG capsule TAKE ONE (1) CAPSULE THREE (3) TIMES EACH DAY  . glipiZIDE (GLUCOTROL XL) 10 MG 24 hr tablet Take 1 tablet (10 mg total) by mouth 2 (two) times daily.  . insulin lispro (HUMALOG) 100 UNIT/ML KwikPen Inject 4 Units into the skin 2 (two) times daily before a meal. Sliding scale given to patient. Max humalog is 20 U.  Marland Kitchen JANUMET 50-1000 MG tablet TAKE ONE (1) TABLET BY MOUTH ONCE DAILY  . LANTUS SOLOSTAR 100 UNIT/ML Solostar Pen Inject 65 Units into the skin daily.  Marland Kitchen lisinopril (ZESTRIL) 2.5 MG tablet TAKE ONE (1) TABLET ONCE DAILY  . meclizine (ANTIVERT) 25 MG tablet Take 1 tablet (25 mg total) by mouth 3 (three) times daily as needed for dizziness.  . meloxicam (MOBIC) 15 MG tablet Take 1 tablet (15 mg total) by mouth daily.  . metoprolol succinate (TOPROL-XL) 25 MG 24 hr tablet TAKE ONE (1) TABLET ONCE DAILY  . ofloxacin (OCUFLOX) 0.3 % ophthalmic solution   . omega-3 acid ethyl esters (LOVAZA) 1 g capsule Take 2 capsules (2 g total) by mouth 2 (two) times daily.  Marland Kitchen zolpidem (AMBIEN) 10 MG tablet Take 10 mg by mouth at bedtime.    No facility-administered encounter medications on file as of 11/14/2020.   Recent Relevant Labs: Lab Results   Component Value Date/Time   HGBA1C 9.5 (H) 10/17/2020 09:55 AM   HGBA1C 10.2 (H) 06/28/2020 09:30 AM   MICROALBUR 10 10/17/2020 09:50 AM   MICROALBUR 10 11/30/2019 09:50 AM    Kidney Function Lab Results  Component Value Date/Time   CREATININE 0.71 (L) 10/17/2020 09:55 AM   CREATININE 0.84 06/28/2020 09:30 AM   GFRNONAA 91 06/28/2020 09:30 AM   GFRAA 105 06/28/2020 09:30 AM   Several attempts made, unable to reach patient.   Clarita Leber, [place] Pharmacist Assistant [phone]

## 2020-11-24 DIAGNOSIS — R339 Retention of urine, unspecified: Secondary | ICD-10-CM | POA: Diagnosis not present

## 2020-11-24 DIAGNOSIS — R32 Unspecified urinary incontinence: Secondary | ICD-10-CM | POA: Diagnosis not present

## 2020-11-24 DIAGNOSIS — N401 Enlarged prostate with lower urinary tract symptoms: Secondary | ICD-10-CM | POA: Diagnosis not present

## 2020-11-24 DIAGNOSIS — R81 Glycosuria: Secondary | ICD-10-CM | POA: Diagnosis not present

## 2020-12-09 ENCOUNTER — Other Ambulatory Visit: Payer: Self-pay

## 2020-12-09 MED ORDER — ATORVASTATIN CALCIUM 80 MG PO TABS: ORAL_TABLET | ORAL | 0 refills | Status: DC

## 2020-12-16 ENCOUNTER — Telehealth: Payer: Self-pay

## 2020-12-16 NOTE — Progress Notes (Signed)
    Chronic Care Management Pharmacy Assistant   Name: Joshua Nelson  MRN: [id number] DOB: [birth year]    Unable to reach patient x3 attempts.     Recent office visits:  No office visits noted since last visit with CPP  Recent consult visits:  No consult visits noted since last visit with [place] Hospital visits:  No ED visits noted since last visit with CPP  Medications: Outpatient Encounter Medications as of 12/16/2020  Medication Sig  . atorvastatin (LIPITOR) 80 MG tablet TAKE ONE (1) TABLET ONCE DAILY  . Continuous Blood Gluc Receiver (FREESTYLE LIBRE 2 READER) DEVI Check qac and qhs  . Continuous Blood Gluc Sensor (FREESTYLE LIBRE 2 SENSOR) MISC 1 each by Does not apply route every 14 (fourteen) days.  Marland Kitchen ezetimibe (ZETIA) 10 MG tablet TAKE ONE (1) TABLET ONCE DAILY  . gabapentin (NEURONTIN) 300 MG capsule TAKE ONE (1) CAPSULE THREE (3) TIMES EACH DAY  . glipiZIDE (GLUCOTROL XL) 10 MG 24 hr tablet Take 1 tablet (10 mg total) by mouth 2 (two) times daily.  . insulin lispro (HUMALOG) 100 UNIT/ML KwikPen Inject 4 Units into the skin 2 (two) times daily before a meal. Sliding scale given to patient. Max humalog is 20 U.  Marland Kitchen JANUMET 50-1000 MG tablet TAKE ONE (1) TABLET BY MOUTH ONCE DAILY  . LANTUS SOLOSTAR 100 UNIT/ML Solostar Pen Inject 65 Units into the skin daily.  Marland Kitchen lisinopril (ZESTRIL) 2.5 MG tablet TAKE ONE (1) TABLET ONCE DAILY  . meclizine (ANTIVERT) 25 MG tablet Take 1 tablet (25 mg total) by mouth 3 (three) times daily as needed for dizziness.  . meloxicam (MOBIC) 15 MG tablet Take 1 tablet (15 mg total) by mouth daily.  . metoprolol succinate (TOPROL-XL) 25 MG 24 hr tablet TAKE ONE (1) TABLET ONCE DAILY  . ofloxacin (OCUFLOX) 0.3 % ophthalmic solution   . omega-3 acid ethyl esters (LOVAZA) 1 g capsule Take 2 capsules (2 g total) by mouth 2 (two) times daily.  Marland Kitchen zolpidem (AMBIEN) 10 MG tablet Take 10 mg by mouth at bedtime.    No facility-administered encounter medications  on file as of 12/16/2020.   Recent Relevant Labs: Lab Results  Component Value Date/Time   HGBA1C 9.5 (H) 10/17/2020 09:55 AM   HGBA1C 10.2 (H) 06/28/2020 09:30 AM   MICROALBUR 10 10/17/2020 09:50 AM   MICROALBUR 10 11/30/2019 09:50 AM    Kidney Function Lab Results  Component Value Date/Time   CREATININE 0.71 (L) 10/17/2020 09:55 AM   CREATININE 0.84 06/28/2020 09:30 AM   GFRNONAA 91 06/28/2020 09:30 AM   GFRAA 105 06/28/2020 09:30 AM     Current antihyperglycemic regimen:   Glipizide 10mg . 24hr. tablet: Take 1 po bid Insulin lispro: 4 units bid before meals  Janumet: 50-1000: Take 1 po qd Lantus 100 unit/ ml pen: Inject 65 units subq qd.      Star Rating Drugs: Medication:  Last Fill: Day Supply Lisinopril  10/04/2020 90DS  Atorvastatin  12/09/2020 90DS   Unable to reach patient x3 attempts  Marcine Matar, [place] Clinical Pharmacist Assistant

## 2020-12-19 ENCOUNTER — Telehealth: Payer: Self-pay

## 2020-12-19 NOTE — Telephone Encounter (Signed)
PA submitted and denied for Humalog KwikPen via covermymeds. Patient must try and fail Fiasp flex touch U-100 subq pen and Fiasp U-100 insulin subq solution.

## 2020-12-21 ENCOUNTER — Other Ambulatory Visit: Payer: Self-pay | Admitting: Family Medicine

## 2020-12-21 MED ORDER — FIASP FLEXTOUCH 100 UNIT/ML ~~LOC~~ SOPN: PEN_INJECTOR | SUBCUTANEOUS | 2 refills | Status: DC

## 2020-12-22 NOTE — Telephone Encounter (Signed)
Left message for patient to call our office.

## 2020-12-23 ENCOUNTER — Other Ambulatory Visit: Payer: Self-pay

## 2020-12-23 MED ORDER — FIASP FLEXTOUCH 100 UNIT/ML ~~LOC~~ SOPN: PEN_INJECTOR | SUBCUTANEOUS | 2 refills | Status: DC

## 2021-01-05 DIAGNOSIS — N401 Enlarged prostate with lower urinary tract symptoms: Secondary | ICD-10-CM | POA: Diagnosis not present

## 2021-01-05 DIAGNOSIS — R32 Unspecified urinary incontinence: Secondary | ICD-10-CM | POA: Diagnosis not present

## 2021-01-05 DIAGNOSIS — R339 Retention of urine, unspecified: Secondary | ICD-10-CM | POA: Diagnosis not present

## 2021-01-05 DIAGNOSIS — R81 Glycosuria: Secondary | ICD-10-CM | POA: Diagnosis not present

## 2021-01-12 ENCOUNTER — Telehealth: Payer: Self-pay

## 2021-01-12 NOTE — Progress Notes (Signed)
Chronic Care Management Pharmacy Assistant   Name: CAMAR GUYTON  MRN: [id number] DOB: February 19, 1951  ATTEMPTED TO REACH PATIENT X3   Reason for Encounter: Disease state call for DM  Recent office visits:  No visits noted since last CCM visit   Recent consult visits:  No visits noted since last CCM visit   Hospital visits:  No visits noted since last CCM visit   Medications: Outpatient Encounter Medications as of 01/12/2021  Medication Sig  . atorvastatin (LIPITOR) 80 MG tablet TAKE ONE (1) TABLET ONCE DAILY  . Continuous Blood Gluc Receiver (FREESTYLE LIBRE 2 READER) DEVI Check qac and qhs  . Continuous Blood Gluc Sensor (FREESTYLE LIBRE 2 SENSOR) MISC 1 each by Does not apply route every 14 (fourteen) days.  Marland Kitchen ezetimibe (ZETIA) 10 MG tablet TAKE ONE (1) TABLET ONCE DAILY  . gabapentin (NEURONTIN) 300 MG capsule TAKE ONE (1) CAPSULE THREE (3) TIMES EACH DAY  . glipiZIDE (GLUCOTROL XL) 10 MG 24 hr tablet Take 1 tablet (10 mg total) by mouth 2 (two) times daily.  . insulin aspart (FIASP FLEXTOUCH) 100 UNIT/ML FlexTouch Pen Dose AT THE TIME OF MEAL as follows: 100-150 = 4 U, 151-200 = 8 U, 201-250 = 16 U, 251-300 = 20 U, Greater than 300 = 24 U.  Marland Kitchen JANUMET 50-1000 MG tablet TAKE ONE (1) TABLET BY MOUTH ONCE DAILY  . LANTUS SOLOSTAR 100 UNIT/ML Solostar Pen Inject 65 Units into the skin daily.  Marland Kitchen lisinopril (ZESTRIL) 2.5 MG tablet TAKE ONE (1) TABLET ONCE DAILY  . meclizine (ANTIVERT) 25 MG tablet Take 1 tablet (25 mg total) by mouth 3 (three) times daily as needed for dizziness.  . meloxicam (MOBIC) 15 MG tablet Take 1 tablet (15 mg total) by mouth daily.  . metoprolol succinate (TOPROL-XL) 25 MG 24 hr tablet TAKE ONE (1) TABLET ONCE DAILY  . ofloxacin (OCUFLOX) 0.3 % ophthalmic solution   . omega-3 acid ethyl esters (LOVAZA) 1 g capsule Take 2 capsules (2 g total) by mouth 2 (two) times daily.  Marland Kitchen zolpidem (AMBIEN) 10 MG tablet Take 10 mg by mouth at bedtime.    No  facility-administered encounter medications on file as of 01/12/2021.   Recent Relevant Labs: Lab Results  Component Value Date/Time   HGBA1C 9.5 (H) 10/17/2020 09:55 AM   HGBA1C 10.2 (H) 06/28/2020 09:30 AM   MICROALBUR 10 10/17/2020 09:50 AM   MICROALBUR 10 11/30/2019 09:50 AM    Kidney Function Lab Results  Component Value Date/Time   CREATININE 0.71 (L) 10/17/2020 09:55 AM   CREATININE 0.84 06/28/2020 09:30 AM   GFRNONAA 91 06/28/2020 09:30 AM   GFRAA 105 06/28/2020 09:30 AM     Current antihyperglycemic regimen:  Glipizide 10mg . 24hr. tablet: Take 1 twice daily Insulin lispro: 4 units twice daily before meals  Janumet: 50-1000: Take 1 daily  Lantus 100 unit/ ml pen: Inject 65 units daily  On insulin? yes  Adherence Review: Is the patient currently on a STATIN medication? Yes Is the patient currently on ACE/ARB medication? Yes Does the patient have >5 day gap between last estimated fill dates? Yes, CPP to review  Star Rating Drugs:  Medication:   Last Fill:  Day Supply Lisinopril  2.5mg .   10/04/2020  90DS                                 Atorvastatin 80mg .  12/09/2020  90DS  Confirmed last fill dates with [facility].    Marcine Matar, [place] Clinical Pharmacist Assistant

## 2021-01-17 NOTE — Progress Notes (Signed)
Subjective:  Patient ID: Joshua Nelson, male    DOB: 09-02-1950  Age: 70 y.o. MRN: [id number]  Chief Complaint  Patient presents with  . Diabetes  . Hyperlipidemia    HPI Hypertensive heart disease without heart failure Metoprolol 25 mg and lisinopril 2.5 mg daily  Combined hyperlipidemia associated with type 2 diabetes mellitus (HCC) Taking glipizide 10 mg, Fiasp on a sliding scale, janumet 50-1000 mg and lantus 65 units daily. Patient has a freestyle libre 2.  The sensor has not been working over the last couple of days.  He had gotten a sample from [place] its not working for some reason.  His sugars are generally 140-160.  He occasionally has low blood sugars feels shaky and will  eat some candy.  Patient checks his feet daily.  He does not eat particularly healthy.  He is very active at work.  He is working at [employer].  Patient has neuropathy mainly in his hands.  He takes gabapentin.  He is concerned about his toenails and would like to go back to the foot doctor he seen in the past.  He said he or his wife will call and make that appointment.  Mixed hyperlipidemia Taking lipitor 80 mg, lovaza 2g bid and zetia 10mg  daily  Current Outpatient Medications on File Prior to Visit  Medication Sig Dispense Refill  . atorvastatin (LIPITOR) 80 MG tablet TAKE ONE (1) TABLET ONCE DAILY 90 tablet 0  . Continuous Blood Gluc Receiver (FREESTYLE LIBRE 2 READER) DEVI Check qac and qhs 1 each 0  . Continuous Blood Gluc Sensor (FREESTYLE LIBRE 2 SENSOR) MISC 1 each by Does not apply route every 14 (fourteen) days. 6 each 3  . gabapentin (NEURONTIN) 300 MG capsule TAKE ONE (1) CAPSULE THREE (3) TIMES EACH DAY 270 capsule 2  . glipiZIDE (GLUCOTROL XL) 10 MG 24 hr tablet Take 1 tablet (10 mg total) by mouth 2 (two) times daily. 180 tablet 0  . insulin aspart (FIASP FLEXTOUCH) 100 UNIT/ML FlexTouch Pen Dose AT THE TIME OF MEAL as follows: 100-150 = 4 U, 151-200 = 8 U, 201-250 = 16 U, 251-300 = 20 U,  Greater than 300 = 24 U. 15 mL 2  . JANUMET 50-1000 MG tablet TAKE ONE (1) TABLET BY MOUTH ONCE DAILY 90 tablet 1  . LANTUS SOLOSTAR 100 UNIT/ML Solostar Pen Inject 65 Units into the skin daily. 60 mL 0  . lisinopril (ZESTRIL) 2.5 MG tablet TAKE ONE (1) TABLET ONCE DAILY 90 tablet 0  . meclizine (ANTIVERT) 25 MG tablet Take 1 tablet (25 mg total) by mouth 3 (three) times daily as needed for dizziness. 30 tablet 2  . meloxicam (MOBIC) 15 MG tablet Take 1 tablet (15 mg total) by mouth daily. 30 tablet 0  . metoprolol succinate (TOPROL-XL) 25 MG 24 hr tablet TAKE ONE (1) TABLET ONCE DAILY 90 tablet 0  . ofloxacin (OCUFLOX) 0.3 % ophthalmic solution     . omega-3 acid ethyl esters (LOVAZA) 1 g capsule Take 2 capsules (2 g total) by mouth 2 (two) times daily. 360 capsule 0  . zolpidem (AMBIEN) 10 MG tablet Take 10 mg by mouth at bedtime.      No current facility-administered medications on file prior to visit.   Past Medical History:  Diagnosis Date  . Coronary artery disease   . CVA (cerebral vascular accident) ([place])   . Diabetes mellitus without complication ([place])   . Hypertension   . Insomnia   .  Myocardial infarct [facility])    Past Surgical History:  Procedure Laterality Date  . heart stents     x 3  . left cataract  10/2019  . right cataract Right 08/2019    Family History  Problem Relation Age of Onset  . Endometrial cancer Mother   . CAD Mother   . AAA (abdominal aortic aneurysm) Mother   . Healthy Father   . Heart attack Brother    Social History   Socioeconomic History  . Marital status: Married    Spouse name: Not on file  . Number of children: 1  . Years of education: 69  . Highest education level: High school graduate  Occupational History  . Occupation: Retired  Tobacco Use  . Smoking status: Former Smoker    Quit date: 2003    Years since quitting: 19.4  . Smokeless tobacco: Never Used  Vaping Use  . Vaping Use: Never used  Substance and Sexual Activity  .  Alcohol use: No  . Drug use: Never  . Sexual activity: Not on file  Other Topics Concern  . Not on file  Social History Narrative   Lives at home with his wife.   2 cups caffeine per day.   Right-handed.   Social Determinants of Health   Financial Resource Strain: Not on file  Food Insecurity: Not on file  Transportation Needs: Not on file  Physical Activity: Not on file  Stress: Not on file  Social Connections: Not on file    Review of Systems  Constitutional: Negative for chills, diaphoresis, fatigue and fever.  HENT: Negative for congestion, ear pain and sore throat.   Respiratory: Negative for cough and shortness of breath.   Cardiovascular: Negative for chest pain and leg swelling.  Gastrointestinal: Negative for abdominal pain, constipation, diarrhea, nausea and vomiting.  Endocrine: Positive for polydipsia.  Genitourinary: Negative for dysuria and urgency.       Poor bladder control   Musculoskeletal: Negative for arthralgias and myalgias.  Neurological: Negative for dizziness and headaches.  Psychiatric/Behavioral: Negative for dysphoric mood.     Objective:  BP 138/68   Pulse 74   Temp (!) 97.5 F (36.4 C)   Resp 18   Ht 5\' 7"  (1.702 m)   Wt 227 lb 3.2 oz (103.1 kg)   BMI 35.58 kg/m   BP/Weight 01/18/2021 10/17/2020 62/70/3500  Systolic BP 938 182 993  Diastolic BP 68 70 64  Wt. (Lbs) 227.2 226 223.2  BMI 35.58 35.4 32.96    Physical Exam Vitals reviewed.  Constitutional:      Appearance: Normal appearance. He is obese.  Neck:     Vascular: No carotid bruit.  Cardiovascular:     Rate and Rhythm: Normal rate and regular rhythm.     Pulses: Normal pulses.     Heart sounds: Normal heart sounds. No murmur heard.   Pulmonary:     Effort: Pulmonary effort is normal.     Breath sounds: Normal breath sounds. No wheezing, rhonchi or rales.  Abdominal:     General: Abdomen is flat. Bowel sounds are normal.     Palpations: Abdomen is soft.      Tenderness: There is no abdominal tenderness.  Neurological:     Mental Status: He is alert and oriented to person, place, and time.  Psychiatric:        Mood and Affect: Mood normal.        Behavior: Behavior normal.     Diabetic Foot  Exam - Simple   Simple Foot Form Diabetic Foot exam was performed with the following findings: Yes 01/18/2021  9:00 AM  Visual Inspection See comments: Yes Sensation Testing Intact to touch and monofilament testing bilaterally: Yes Pulse Check Posterior Tibialis and Dorsalis pulse intact bilaterally: Yes Comments Thickened nails, yellowed.  Patient has a yellowish plaque like scaling on his dorsal surface of bilateral feet in the area of the toes.      Lab Results  Component Value Date   WBC 8.9 10/17/2020   HGB 13.4 10/17/2020   HCT 39.9 10/17/2020   PLT 170 10/17/2020   GLUCOSE 164 (H) 10/17/2020   CHOL 150 10/17/2020   TRIG 129 10/17/2020   HDL 31 (L) 10/17/2020   LDLCALC 96 10/17/2020   ALT 20 10/17/2020   AST 18 10/17/2020   NA 139 10/17/2020   K 4.2 10/17/2020   CL 100 10/17/2020   CREATININE 0.71 (L) 10/17/2020   BUN 9 10/17/2020   CO2 24 10/17/2020   TSH 0.687 06/28/2020   HGBA1C 9.5 (H) 10/17/2020   MICROALBUR 10 10/17/2020      Assessment & Plan:   1. Hypertensive heart disease without heart failure Well controlled.  No changes to medicines.  Continue to work on eating a healthy diet and exercise.  Labs drawn today.  - CBC with Differential/Platelet - Comprehensive metabolic panel  2. Mixed hyperlipidemia Well controlled.  No changes to medicines.  Continue to work on eating a healthy diet and exercise.  Labs drawn today.  - Lipid panel - ezetimibe (ZETIA) 10 MG tablet; TAKE ONE (1) TABLET ONCE DAILY  Dispense: 90 tablet; Refill: 1  3. Controlled type 2 diabetes mellitus with diabetic polyneuropathy, with long-term current use of insulin (HCC) Control: await a1c.  Recommend check sugars qac and  qhs. Recommend check feet daily. Recommend annual eye exams. Medicines: adjust after labs. Continue to work on eating a healthy diet and exercise.  Labs drawn today.   - Hemoglobin A1c  4. Class 2 severe obesity due to excess calories with serious comorbidity and body mass index (BMI) of 35.0 to 35.9 in adult [facility]) Recommend continue to work on eating healthy diet and exercise.  5. Atherosclerosis of aorta (HCC) On statin.  6. Onychomycosis of multiple toenails with type 2 diabetes mellitus ([place])  Pt to call podiatry.   Meds ordered this encounter  Medications  . ezetimibe (ZETIA) 10 MG tablet    Sig: TAKE ONE (1) TABLET ONCE DAILY    Dispense:  90 tablet    Refill:  1    Orders Placed This Encounter  Procedures  . CBC with Differential/Platelet  . Comprehensive metabolic panel  . Hemoglobin A1c  . Lipid panel     Follow-up: No follow-ups on file.  An After Visit Summary was printed and given to the patient.  [name], MD [facility] [phone]

## 2021-01-18 ENCOUNTER — Other Ambulatory Visit: Payer: Self-pay

## 2021-01-18 ENCOUNTER — Ambulatory Visit (INDEPENDENT_AMBULATORY_CARE_PROVIDER_SITE_OTHER): Payer: Medicare HMO | Admitting: Family Medicine

## 2021-01-18 ENCOUNTER — Encounter: Payer: Self-pay | Admitting: Family Medicine

## 2021-01-18 VITALS — BP 138/68 | HR 74 | Temp 97.5°F | Resp 18 | Ht 67.0 in | Wt 227.2 lb

## 2021-01-18 DIAGNOSIS — I700 Atherosclerosis of aorta: Secondary | ICD-10-CM

## 2021-01-18 DIAGNOSIS — Z794 Long term (current) use of insulin: Secondary | ICD-10-CM | POA: Diagnosis not present

## 2021-01-18 DIAGNOSIS — B351 Tinea unguium: Secondary | ICD-10-CM

## 2021-01-18 DIAGNOSIS — E782 Mixed hyperlipidemia: Secondary | ICD-10-CM

## 2021-01-18 DIAGNOSIS — E1142 Type 2 diabetes mellitus with diabetic polyneuropathy: Secondary | ICD-10-CM

## 2021-01-18 DIAGNOSIS — E1169 Type 2 diabetes mellitus with other specified complication: Secondary | ICD-10-CM

## 2021-01-18 DIAGNOSIS — Z6835 Body mass index (BMI) 35.0-35.9, adult: Secondary | ICD-10-CM | POA: Diagnosis not present

## 2021-01-18 DIAGNOSIS — I119 Hypertensive heart disease without heart failure: Secondary | ICD-10-CM | POA: Diagnosis not present

## 2021-01-18 MED ORDER — EZETIMIBE 10 MG PO TABS: ORAL_TABLET | ORAL | 1 refills | Status: AC

## 2021-01-19 LAB — CBC WITH DIFFERENTIAL/PLATELET
Basophils Absolute: 0.1 10*3/uL (ref 0.0–0.2)
Basos: 1 %
EOS (ABSOLUTE): 0.6 10*3/uL — ABNORMAL HIGH (ref 0.0–0.4)
Eos: 7 %
Hematocrit: 38.1 % (ref 37.5–51.0)
Hemoglobin: 12.6 g/dL — ABNORMAL LOW (ref 13.0–17.7)
Immature Grans (Abs): 0 10*3/uL (ref 0.0–0.1)
Immature Granulocytes: 1 %
Lymphocytes Absolute: 1.9 10*3/uL (ref 0.7–3.1)
Lymphs: 23 %
MCH: 28.7 pg (ref 26.6–33.0)
MCHC: 33.1 g/dL (ref 31.5–35.7)
MCV: 87 fL (ref 79–97)
Monocytes Absolute: 0.6 10*3/uL (ref 0.1–0.9)
Monocytes: 7 %
Neutrophils Absolute: 5.1 10*3/uL (ref 1.4–7.0)
Neutrophils: 61 %
Platelets: 163 10*3/uL (ref 150–450)
RBC: 4.39 x10E6/uL (ref 4.14–5.80)
RDW: 13.1 % (ref 11.6–15.4)
WBC: 8.2 10*3/uL (ref 3.4–10.8)

## 2021-01-19 LAB — COMPREHENSIVE METABOLIC PANEL
ALT: 18 IU/L (ref 0–44)
AST: 17 IU/L (ref 0–40)
Albumin/Globulin Ratio: 2 (ref 1.2–2.2)
Albumin: 4.5 g/dL (ref 3.8–4.8)
Alkaline Phosphatase: 63 IU/L (ref 44–121)
BUN/Creatinine Ratio: 14 (ref 10–24)
BUN: 12 mg/dL (ref 8–27)
Bilirubin Total: 0.6 mg/dL (ref 0.0–1.2)
CO2: 25 mmol/L (ref 20–29)
Calcium: 9.4 mg/dL (ref 8.6–10.2)
Chloride: 101 mmol/L (ref 96–106)
Creatinine, Ser: 0.83 mg/dL (ref 0.76–1.27)
Globulin, Total: 2.3 g/dL (ref 1.5–4.5)
Glucose: 203 mg/dL — ABNORMAL HIGH (ref 65–99)
Potassium: 4.8 mmol/L (ref 3.5–5.2)
Sodium: 139 mmol/L (ref 134–144)
Total Protein: 6.8 g/dL (ref 6.0–8.5)
eGFR: 96 mL/min/{1.73_m2} (ref >59)

## 2021-01-19 LAB — LIPID PANEL
Chol/HDL Ratio: 4.9 ratio (ref 0.0–5.0)
Cholesterol, Total: 146 mg/dL (ref 100–199)
HDL: 30 mg/dL — ABNORMAL LOW (ref >39)
LDL Chol Calc (NIH): 95 mg/dL (ref 0–99)
Triglycerides: 113 mg/dL (ref 0–149)
VLDL Cholesterol Cal: 21 mg/dL (ref 5–40)

## 2021-01-19 LAB — HEMOGLOBIN A1C
Est. average glucose Bld gHb Est-mCnc: 194 mg/dL
Hgb A1c MFr Bld: 8.4 % — ABNORMAL HIGH (ref 4.8–5.6)

## 2021-01-19 LAB — CARDIOVASCULAR RISK ASSESSMENT

## 2021-01-19 NOTE — Progress Notes (Signed)
Attempting to reach patient or his wife for correct email to link with Elenor Legato.

## 2021-02-08 ENCOUNTER — Other Ambulatory Visit: Payer: Self-pay | Admitting: Family Medicine

## 2021-02-20 DIAGNOSIS — E1165 Type 2 diabetes mellitus with hyperglycemia: Secondary | ICD-10-CM | POA: Diagnosis not present

## 2021-02-20 DIAGNOSIS — E118 Type 2 diabetes mellitus with unspecified complications: Secondary | ICD-10-CM | POA: Diagnosis not present

## 2021-03-23 DIAGNOSIS — E1165 Type 2 diabetes mellitus with hyperglycemia: Secondary | ICD-10-CM | POA: Diagnosis not present

## 2021-03-23 DIAGNOSIS — E118 Type 2 diabetes mellitus with unspecified complications: Secondary | ICD-10-CM | POA: Diagnosis not present

## 2021-03-29 ENCOUNTER — Other Ambulatory Visit: Payer: Self-pay | Admitting: Family Medicine

## 2021-03-31 ENCOUNTER — Telehealth: Payer: Self-pay

## 2021-03-31 NOTE — Chronic Care Management (AMB) (Signed)
Chronic Care Management Pharmacy Assistant   Name: Joshua Nelson  MRN: [id number] DOB: 03/08/1951  Reason for Encounter: Disease State/ Hypertension  Recent office visits:  10-17-2020 Joshua Brome, MD. START Meloxicam 15 mg daily. Glucose= 164, Creatinine= 0.71. A1C= 9.5. HDL= 31. A1C= 8.4. HDL= 30. Cardiovascular risk assessment.  01-18-2021 Joshua Brome, MD. Hemo= 12.6, EOS= 0.6. Glucose= 203  Recent consult visits:  10-19-2020 Joshua Bimler, MD (Urology). Unable to view notes.  11-24-2020 Joshua Bimler, MD (Urology). Unable to view notes.  01-05-2021  Joshua Bimler, MD (Urology). Unable to view notes.  Hospital visits:  None in previous 6 months  Medications: Outpatient Encounter Medications as of 03/31/2021  Medication Sig   atorvastatin (LIPITOR) 80 MG tablet TAKE ONE (1) TABLET ONCE DAILY   Continuous Blood Gluc Receiver (FREESTYLE LIBRE 2 READER) DEVI Check qac and qhs   Continuous Blood Gluc Sensor (FREESTYLE LIBRE 2 SENSOR) MISC 1 each by Does not apply route every 14 (fourteen) days.   ezetimibe (ZETIA) 10 MG tablet TAKE ONE (1) TABLET ONCE DAILY   gabapentin (NEURONTIN) 300 MG capsule TAKE ONE (1) CAPSULE THREE (3) TIMES EACH DAY   glipiZIDE (GLUCOTROL XL) 10 MG 24 hr tablet Take 1 tablet (10 mg total) by mouth 2 (two) times daily.   insulin aspart (FIASP FLEXTOUCH) 100 UNIT/ML FlexTouch Pen Dose AT THE TIME OF MEAL as follows: 100-150 = 4 U, 151-200 = 8 U, 201-250 = 16 U, 251-300 = 20 U, Greater than 300 = 24 U.   JANUMET 50-1000 MG tablet TAKE ONE (1) TABLET BY MOUTH ONCE DAILY   LANTUS SOLOSTAR 100 UNIT/ML Solostar Pen Inject 65 Units into the skin daily.   lisinopril (ZESTRIL) 2.5 MG tablet TAKE ONE (1) TABLET ONCE DAILY   meclizine (ANTIVERT) 25 MG tablet Take 1 tablet (25 mg total) by mouth 3 (three) times daily as needed for dizziness.   meloxicam (MOBIC) 15 MG tablet Take 1 tablet (15 mg total) by mouth daily.   metoprolol succinate (TOPROL-XL) 25 MG 24 hr  tablet TAKE ONE (1) TABLET ONCE DAILY   ofloxacin (OCUFLOX) 0.3 % ophthalmic solution    omega-3 acid ethyl esters (LOVAZA) 1 g capsule Take 2 capsules (2 g total) by mouth 2 (two) times daily.   zolpidem (AMBIEN) 10 MG tablet Take 10 mg by mouth at bedtime.    No facility-administered encounter medications on file as of 03/31/2021.     Recent Office Vitals: BP Readings from Last 3 Encounters:  01/18/21 138/68  10/17/20 136/70  07/12/20 132/64   Pulse Readings from Last 3 Encounters:  01/18/21 74  10/17/20 91  07/12/20 85    Wt Readings from Last 3 Encounters:  01/18/21 227 lb 3.2 oz (103.1 kg)  10/17/20 226 lb (102.5 kg)  07/12/20 223 lb 3.2 oz (101.2 kg)     Kidney Function Lab Results  Component Value Date/Time   CREATININE 0.83 01/18/2021 09:12 AM   CREATININE 0.71 (L) 10/17/2020 09:55 AM   GFRNONAA 91 06/28/2020 09:30 AM   GFRAA 105 06/28/2020 09:30 AM    BMP Latest Ref Rng & Units 01/18/2021 10/17/2020 06/28/2020  Glucose 65 - 99 mg/dL 203(H) 164(H) 362(H)  BUN 8 - 27 mg/dL '12 9 12  '$ Creatinine 0.76 - 1.27 mg/dL 0.83 0.71(L) 0.84  BUN/Creat Ratio 10 - '24 14 13 14  '$ Sodium 134 - 144 mmol/L 139 139 136  Potassium 3.5 - 5.2 mmol/L 4.8 4.2 4.9  Chloride 96 - 106 mmol/L 101  100 97  CO2 20 - 29 mmol/L '25 24 23  '$ Calcium 8.6 - 10.2 mg/dL 9.4 9.5 9.4     Care Gaps: Last annual wellness visit? 07-12-2020 Hep C screening overdue Tdap never done Shingrix never done Colonoscopy never done Yearly ophthalmology overdue  Star Rating Drugs:  Medication:  Last Fill: Day Supply Lisinopril 2.5 mg 02-01-2021 90 Atorvastatin 80 mg 03-29-2021 90 Janumet 50-1000 mg 02-08-2021 90 Glipizide 10 mg 02-24-2021 90  03-31-2021: 1st attempt left VM with wife. Unable to leave VM on mobile 04-07-2021: 2nd attempt left VM with wife. Unable to leave VM on mobile  [place] Pharmacist Assistant [phone]

## 2021-04-21 ENCOUNTER — Other Ambulatory Visit: Payer: Self-pay | Admitting: Physician Assistant

## 2021-04-25 ENCOUNTER — Telehealth: Payer: Self-pay

## 2021-04-25 ENCOUNTER — Other Ambulatory Visit: Payer: Self-pay

## 2021-04-25 ENCOUNTER — Ambulatory Visit (INDEPENDENT_AMBULATORY_CARE_PROVIDER_SITE_OTHER): Payer: Medicare HMO | Admitting: Family Medicine

## 2021-04-25 VITALS — BP 118/72 | HR 70 | Temp 97.4°F | Resp 16 | Ht 67.0 in | Wt 223.0 lb

## 2021-04-25 DIAGNOSIS — Z1159 Encounter for screening for other viral diseases: Secondary | ICD-10-CM

## 2021-04-25 DIAGNOSIS — I2510 Atherosclerotic heart disease of native coronary artery without angina pectoris: Secondary | ICD-10-CM

## 2021-04-25 DIAGNOSIS — E782 Mixed hyperlipidemia: Secondary | ICD-10-CM

## 2021-04-25 DIAGNOSIS — L602 Onychogryphosis: Secondary | ICD-10-CM | POA: Diagnosis not present

## 2021-04-25 DIAGNOSIS — Z23 Encounter for immunization: Secondary | ICD-10-CM | POA: Diagnosis not present

## 2021-04-25 DIAGNOSIS — I119 Hypertensive heart disease without heart failure: Secondary | ICD-10-CM

## 2021-04-25 DIAGNOSIS — Z1211 Encounter for screening for malignant neoplasm of colon: Secondary | ICD-10-CM

## 2021-04-25 DIAGNOSIS — G4733 Obstructive sleep apnea (adult) (pediatric): Secondary | ICD-10-CM

## 2021-04-25 DIAGNOSIS — Z794 Long term (current) use of insulin: Secondary | ICD-10-CM

## 2021-04-25 DIAGNOSIS — E1142 Type 2 diabetes mellitus with diabetic polyneuropathy: Secondary | ICD-10-CM

## 2021-04-25 DIAGNOSIS — E118 Type 2 diabetes mellitus with unspecified complications: Secondary | ICD-10-CM | POA: Diagnosis not present

## 2021-04-25 NOTE — Assessment & Plan Note (Signed)
Start farxiga 5 mg once daily.  Start on trulicity [id number] mg weekly x 2 weeks,  Then increase to trulicity to 1.5 mg weekly x 2 weeks.  Stop janumet.  Stop glipizide. Continue lantus 65 U daily.  Continue FIASP rapid insulin sliding scale.

## 2021-04-25 NOTE — Patient Instructions (Addendum)
Start farxiga 5 mg once daily.  Start on trulicity [id number] mg weekly x 2 weeks,  Then increase to trulicity to 1.5 mg weekly x 2 weeks.  Stop janumet.  Stop glipizide. Continue lantus 65 U daily.  Continue FIASP rapid insulin sliding scale.

## 2021-04-25 NOTE — Progress Notes (Signed)
Subjective:  Patient ID: Joshua Nelson, male    DOB: July 01, 1951  Age: 70 y.o. MRN: [id number]  Chief Complaint  Patient presents with   Diabetes   Hyperlipidemia   Hypertension     HPI Diabetes:  Complications: Polyneuropathy. Glucose checking: CGM Glucose logs:150-250s Hypoglycemia: 50s Most recent A1C: 8.4 Current medications: Janumet 50/1000 mg qd, glipizide 10 mg 2 daily, lantus 65 U daily. Gabapentin 300 mg one tid. Last Eye Exam: over a yr Foot checks: yes  Hyperlipidemia: Current medications: Lovaza, zetia, and lipitor  Hypertensive with cad: Complications: Current medications: zestril, metoprolol,lipitor.  Diet: normal Exercise: none  Current Outpatient Medications on File Prior to Visit  Medication Sig Dispense Refill   atorvastatin (LIPITOR) 80 MG tablet TAKE ONE (1) TABLET ONCE DAILY 90 tablet 0   Continuous Blood Gluc Receiver (FREESTYLE LIBRE 2 READER) DEVI Check qac and qhs 1 each 0   Continuous Blood Gluc Sensor (FREESTYLE LIBRE 2 SENSOR) MISC 1 each by Does not apply route every 14 (fourteen) days. 6 each 3   ezetimibe (ZETIA) 10 MG tablet TAKE ONE (1) TABLET ONCE DAILY 90 tablet 1   gabapentin (NEURONTIN) 300 MG capsule TAKE ONE (1) CAPSULE THREE (3) TIMES EACH DAY 270 capsule 2   insulin aspart (FIASP FLEXTOUCH) 100 UNIT/ML FlexTouch Pen Dose AT THE TIME OF MEAL as follows: 100-150 = 4 U, 151-200 = 8 U, 201-250 = 16 U, 251-300 = 20 U, Greater than 300 = 24 U. 15 mL 2   LANTUS SOLOSTAR 100 UNIT/ML Solostar Pen Inject 65 Units into the skin daily. 60 mL 0   lisinopril (ZESTRIL) 2.5 MG tablet TAKE ONE (1) TABLET ONCE DAILY 90 tablet 0   meclizine (ANTIVERT) 25 MG tablet Take 1 tablet (25 mg total) by mouth 3 (three) times daily as needed for dizziness. 30 tablet 2   meloxicam (MOBIC) 15 MG tablet Take 1 tablet (15 mg total) by mouth daily. 30 tablet 0   metoprolol succinate (TOPROL-XL) 25 MG 24 hr tablet TAKE ONE (1) TABLET ONCE DAILY 90 tablet 1    ofloxacin (OCUFLOX) 0.3 % ophthalmic solution      omega-3 acid ethyl esters (LOVAZA) 1 g capsule Take 2 capsules (2 g total) by mouth 2 (two) times daily. 360 capsule 0   solifenacin (VESICARE) 10 MG tablet Take 10 mg by mouth daily.     zolpidem (AMBIEN) 10 MG tablet Take 10 mg by mouth at bedtime.      No current facility-administered medications on file prior to visit.   Past Medical History:  Diagnosis Date   Coronary artery disease    CVA (cerebral vascular accident) ([place])    Diabetes mellitus without complication ([place])    Hypertension    Insomnia    Myocardial infarct [facility])    Past Surgical History:  Procedure Laterality Date   heart stents     x 3   left cataract  10/2019   right cataract Right 08/2019    Family History  Problem Relation Age of Onset   Endometrial cancer Mother    CAD Mother    AAA (abdominal aortic aneurysm) Mother    Healthy Father    Heart attack Brother    Social History   Socioeconomic History   Marital status: Married    Spouse name: Not on file   Number of children: 1   Years of education: 12   Highest education level: High school graduate  Occupational History   Occupation: Retired  Tobacco Use   Smoking status: Former    Types: Cigarettes    Quit date: 2003    Years since quitting: 19.7   Smokeless tobacco: Never  Vaping Use   Vaping Use: Never used  Substance and Sexual Activity   Alcohol use: No   Drug use: Never   Sexual activity: Not on file  Other Topics Concern   Not on file  Social History Narrative   Lives at home with his wife.   2 cups caffeine per day.   Right-handed.   Social Determinants of Health   Financial Resource Strain: Not on file  Food Insecurity: Not on file  Transportation Needs: Not on file  Physical Activity: Not on file  Stress: Not on file  Social Connections: Not on file    Review of Systems  Constitutional:  Negative for chills and fever.  HENT:  Negative for congestion, rhinorrhea  and sore throat.   Respiratory:  Negative for cough and shortness of breath.   Cardiovascular:  Negative for chest pain and palpitations.  Gastrointestinal:  Negative for abdominal pain, constipation, diarrhea, nausea and vomiting.  Endocrine: Positive for polydipsia.  Genitourinary:  Negative for dysuria and urgency.       Poor bladder control.  Musculoskeletal:  Positive for arthralgias (finger and hand pain). Negative for back pain and myalgias.  Neurological:  Negative for dizziness and headaches.  Psychiatric/Behavioral:  Negative for dysphoric mood. The patient is not nervous/anxious.     Objective:  BP 118/72   Pulse 70   Temp (!) 97.4 F (36.3 C)   Resp 16   Ht '5\' 7"'$  (1.702 m)   Wt 223 lb (101.2 kg)   BMI 34.93 kg/m   BP/Weight 04/25/2021 [id number]  Systolic BP 123456 [id number]  Diastolic BP 72 68 70  Wt. (Lbs) 223 227.2 226  BMI 34.93 35.58 35.4    Physical Exam Vitals reviewed.  Constitutional:      Appearance: Normal appearance.  Neck:     Vascular: No carotid bruit.  Cardiovascular:     Rate and Rhythm: Normal rate and regular rhythm.     Pulses: Normal pulses.     Heart sounds: Normal heart sounds.  Pulmonary:     Effort: Pulmonary effort is normal.     Breath sounds: Normal breath sounds. No wheezing, rhonchi or rales.  Abdominal:     General: Bowel sounds are normal.     Palpations: Abdomen is soft.     Tenderness: There is no abdominal tenderness.  Neurological:     Mental Status: He is alert.  Psychiatric:        Mood and Affect: Mood normal.        Behavior: Behavior normal.    Diabetic Foot Exam - Simple   Simple Foot Form  04/25/2021  9:26 PM  Visual Inspection See comments: Yes Sensation Testing See comments: Yes Pulse Check Posterior Tibialis and Dorsalis pulse intact bilaterally: Yes Comments Calluses. Decreased sensation BL feet.       Lab Results  Component Value Date   WBC 8.8 04/25/2021   HGB 12.9 (L) 04/25/2021   HCT  38.9 04/25/2021   PLT 193 04/25/2021   GLUCOSE 163 (H) 04/25/2021   CHOL 140 04/25/2021   TRIG 113 04/25/2021   HDL 26 (L) 04/25/2021   LDLCALC 93 04/25/2021   ALT 16 04/25/2021   AST 17 04/25/2021   NA 139 04/25/2021   K 4.8 04/25/2021   CL 102 04/25/2021  CREATININE 0.83 04/25/2021   BUN 12 04/25/2021   CO2 25 04/25/2021   TSH 0.687 06/28/2020   HGBA1C 8.1 (H) 04/25/2021   MICROALBUR 10 10/17/2020      Assessment & Plan:   Problem List Items Addressed This Visit       Cardiovascular and Mediastinum   Hypertensive heart disease without heart failure    The current medical regimen is effective;  continue present plan and medications.       Relevant Orders   CBC with Differential/Platelet (Completed)   Comprehensive metabolic panel (Completed)   Atherosclerosis of native coronary artery of native heart without angina pectoris    Continue zetia and lipitor      Relevant Orders   Lipid panel (Completed)     Respiratory   Obstructive sleep apnea     Endocrine   Diabetic polyneuropathy ([place])    Start farxiga 5 mg once daily.  Start on trulicity [id number] mg weekly x 2 weeks,  Then increase to trulicity to 1.5 mg weekly x 2 weeks.  Stop janumet.  Stop glipizide. Continue lantus 65 U daily.  Continue FIASP rapid insulin sliding scale.          Musculoskeletal and Integument   Onychogryphosis     Other   Mixed hyperlipidemia    Well controlled.  No changes to medicines.  Continue to work on eating a healthy diet and exercise.  Labs drawn today.        Colon cancer screening    cologuard ordered.       Relevant Orders   Cologuard   Need for hepatitis C screening test    Order hep c screen      Relevant Orders   Hepatitis C Antibody (Completed)   Other Visit Diagnoses     Need for influenza vaccination    -  Primary   Relevant Orders   Flu Vaccine QUAD High Dose(Fluad) (Completed)     .  No orders of the defined types were placed in this  encounter.   Orders Placed This Encounter  Procedures   Flu Vaccine QUAD High Dose(Fluad)   Hepatitis C Antibody   Cologuard   CBC with Differential/Platelet   Hemoglobin A1c   Comprehensive metabolic panel   Lipid panel   Cardiovascular Risk Assessment     Follow-up: Return in about 3 months (around 07/25/2021) for chronic follow up, awv after 07/13/2021.  An After Visit Summary was printed and given to the patient.  [name], MD [facility] [phone]

## 2021-04-25 NOTE — Assessment & Plan Note (Signed)
>>  ASSESSMENT AND PLAN FOR DIABETIC POLYNEUROPATHY (HCC) WRITTEN ON 04/25/2021  9:39 AM BY COX, KIRSTEN, MD  Start farxiga 5 mg once daily.  Start on trulicity 0.75 mg weekly x 2 weeks,  Then increase to trulicity to 1.5 mg weekly x 2 weeks.  Stop janumet.  Stop glipizide. Continue lantus 65 U daily.  Continue FIASP rapid insulin sliding scale.

## 2021-04-25 NOTE — Telephone Encounter (Signed)
Patient saw PCP today who asked me to check in, unable to contact

## 2021-04-26 DIAGNOSIS — E118 Type 2 diabetes mellitus with unspecified complications: Secondary | ICD-10-CM | POA: Diagnosis not present

## 2021-04-26 DIAGNOSIS — E1165 Type 2 diabetes mellitus with hyperglycemia: Secondary | ICD-10-CM | POA: Diagnosis not present

## 2021-04-26 LAB — CBC WITH DIFFERENTIAL/PLATELET
Basophils Absolute: 0.1 10*3/uL (ref 0.0–0.2)
Basos: 1 %
EOS (ABSOLUTE): 0.5 10*3/uL — ABNORMAL HIGH (ref 0.0–0.4)
Eos: 5 %
Hematocrit: 38.9 % (ref 37.5–51.0)
Hemoglobin: 12.9 g/dL — ABNORMAL LOW (ref 13.0–17.7)
Immature Grans (Abs): 0 10*3/uL (ref 0.0–0.1)
Immature Granulocytes: 1 %
Lymphocytes Absolute: 1.8 10*3/uL (ref 0.7–3.1)
Lymphs: 20 %
MCH: 28.8 pg (ref 26.6–33.0)
MCHC: 33.2 g/dL (ref 31.5–35.7)
MCV: 87 fL (ref 79–97)
Monocytes Absolute: 0.7 10*3/uL (ref 0.1–0.9)
Monocytes: 8 %
Neutrophils Absolute: 5.8 10*3/uL (ref 1.4–7.0)
Neutrophils: 65 %
Platelets: 193 10*3/uL (ref 150–450)
RBC: 4.48 x10E6/uL (ref 4.14–5.80)
RDW: 12.7 % (ref 11.6–15.4)
WBC: 8.8 10*3/uL (ref 3.4–10.8)

## 2021-04-26 LAB — COMPREHENSIVE METABOLIC PANEL
ALT: 16 IU/L (ref 0–44)
AST: 17 IU/L (ref 0–40)
Albumin/Globulin Ratio: 2 (ref 1.2–2.2)
Albumin: 4.5 g/dL (ref 3.8–4.8)
Alkaline Phosphatase: 61 IU/L (ref 44–121)
BUN/Creatinine Ratio: 14 (ref 10–24)
BUN: 12 mg/dL (ref 8–27)
Bilirubin Total: 0.8 mg/dL (ref 0.0–1.2)
CO2: 25 mmol/L (ref 20–29)
Calcium: 9.5 mg/dL (ref 8.6–10.2)
Chloride: 102 mmol/L (ref 96–106)
Creatinine, Ser: 0.83 mg/dL (ref 0.76–1.27)
Globulin, Total: 2.2 g/dL (ref 1.5–4.5)
Glucose: 163 mg/dL — ABNORMAL HIGH (ref 65–99)
Potassium: 4.8 mmol/L (ref 3.5–5.2)
Sodium: 139 mmol/L (ref 134–144)
Total Protein: 6.7 g/dL (ref 6.0–8.5)
eGFR: 95 mL/min/{1.73_m2} (ref >59)

## 2021-04-26 LAB — LIPID PANEL
Chol/HDL Ratio: 5.4 ratio — ABNORMAL HIGH (ref 0.0–5.0)
Cholesterol, Total: 140 mg/dL (ref 100–199)
HDL: 26 mg/dL — ABNORMAL LOW (ref >39)
LDL Chol Calc (NIH): 93 mg/dL (ref 0–99)
Triglycerides: 113 mg/dL (ref 0–149)
VLDL Cholesterol Cal: 21 mg/dL (ref 5–40)

## 2021-04-26 LAB — CARDIOVASCULAR RISK ASSESSMENT

## 2021-04-26 LAB — HEMOGLOBIN A1C
Est. average glucose Bld gHb Est-mCnc: 186 mg/dL
Hgb A1c MFr Bld: 8.1 % — ABNORMAL HIGH (ref 4.8–5.6)

## 2021-04-26 LAB — HEPATITIS C ANTIBODY: Hep C Virus Ab: <0.1 s/co ratio (ref 0.0–0.9)

## 2021-04-27 DIAGNOSIS — N3941 Urge incontinence: Secondary | ICD-10-CM | POA: Diagnosis not present

## 2021-04-27 DIAGNOSIS — R351 Nocturia: Secondary | ICD-10-CM | POA: Diagnosis not present

## 2021-04-27 DIAGNOSIS — R350 Frequency of micturition: Secondary | ICD-10-CM | POA: Diagnosis not present

## 2021-04-27 DIAGNOSIS — N341 Nonspecific urethritis: Secondary | ICD-10-CM | POA: Diagnosis not present

## 2021-05-01 ENCOUNTER — Encounter: Payer: Self-pay | Admitting: Family Medicine

## 2021-05-01 DIAGNOSIS — Z1159 Encounter for screening for other viral diseases: Secondary | ICD-10-CM | POA: Insufficient documentation

## 2021-05-01 DIAGNOSIS — Z01818 Encounter for other preprocedural examination: Secondary | ICD-10-CM | POA: Insufficient documentation

## 2021-05-01 DIAGNOSIS — Z1211 Encounter for screening for malignant neoplasm of colon: Secondary | ICD-10-CM | POA: Insufficient documentation

## 2021-05-01 NOTE — Assessment & Plan Note (Signed)
Well controlled.  ?No changes to medicines.  ?Continue to work on eating a healthy diet and exercise.  ?Labs drawn today.  ?

## 2021-05-01 NOTE — Assessment & Plan Note (Signed)
Continue zetia and lipitor. 

## 2021-05-01 NOTE — Assessment & Plan Note (Signed)
The current medical regimen is effective;  continue present plan and medications.  

## 2021-05-01 NOTE — Assessment & Plan Note (Signed)
cologuard ordered.

## 2021-05-01 NOTE — Assessment & Plan Note (Signed)
Order hep c screen

## 2021-05-05 ENCOUNTER — Other Ambulatory Visit: Payer: Self-pay | Admitting: Family Medicine

## 2021-05-05 NOTE — Telephone Encounter (Signed)
Refill sent to pharmacy.   

## 2021-05-16 ENCOUNTER — Encounter: Payer: Self-pay | Admitting: Family Medicine

## 2021-05-25 ENCOUNTER — Other Ambulatory Visit: Payer: Self-pay | Admitting: Family Medicine

## 2021-05-26 ENCOUNTER — Other Ambulatory Visit: Payer: Self-pay

## 2021-05-26 ENCOUNTER — Encounter: Payer: Self-pay | Admitting: Family Medicine

## 2021-05-26 ENCOUNTER — Ambulatory Visit (INDEPENDENT_AMBULATORY_CARE_PROVIDER_SITE_OTHER): Payer: Medicare HMO | Admitting: Family Medicine

## 2021-05-26 VITALS — BP 118/72 | HR 93 | Temp 97.4°F | Ht 67.0 in | Wt 219.0 lb

## 2021-05-26 DIAGNOSIS — Z23 Encounter for immunization: Secondary | ICD-10-CM | POA: Diagnosis not present

## 2021-05-26 DIAGNOSIS — E1142 Type 2 diabetes mellitus with diabetic polyneuropathy: Secondary | ICD-10-CM | POA: Diagnosis not present

## 2021-05-26 DIAGNOSIS — I119 Hypertensive heart disease without heart failure: Secondary | ICD-10-CM | POA: Diagnosis not present

## 2021-05-26 MED ORDER — TRULICITY 3 MG/0.5ML ~~LOC~~ SOAJ: 3.0000 mg | SUBCUTANEOUS | 0 refills | Status: DC

## 2021-05-26 MED ORDER — LISINOPRIL 10 MG PO TABS: 10.0000 mg | ORAL_TABLET | Freq: Every day | ORAL | 0 refills | Status: DC

## 2021-05-26 MED ORDER — DAPAGLIFLOZIN PROPANEDIOL 5 MG PO TABS: 5.0000 mg | ORAL_TABLET | Freq: Every day | ORAL | 0 refills | Status: DC

## 2021-05-26 MED ORDER — LISINOPRIL 2.5 MG PO TABS: ORAL_TABLET | ORAL | 0 refills | Status: DC

## 2021-05-26 NOTE — Progress Notes (Signed)
Subjective:  Patient ID: Joshua Nelson, male    DOB: May 22, 1951  Age: 70 y.o. MRN: [id number]  Chief Complaint  Patient presents with   Diabetes    1 month follow up    Diabetes Pertinent negatives for hypoglycemia include no dizziness, headaches or nervousness/anxiousness. Pertinent negatives for diabetes include no chest pain and no fatigue.    Patient has been on a freestyle libre since his last appointment 1 month ago.  He is currently on Lantus 65 units daily, farxiga 5 mg daily, Trulicity 1.5 mg weekly. Stop glipizide and janumet.  Sugars 150-250.   Hypertension: on lisinopril 2.5 mg once daily. BP high today.   Current Outpatient Medications on File Prior to Visit  Medication Sig Dispense Refill   atorvastatin (LIPITOR) 80 MG tablet TAKE ONE (1) TABLET ONCE DAILY 90 tablet 0   Continuous Blood Gluc Receiver (FREESTYLE LIBRE 2 READER) DEVI Check qac and qhs 1 each 0   Continuous Blood Gluc Sensor (FREESTYLE LIBRE 2 SENSOR) MISC 1 each by Does not apply route every 14 (fourteen) days. 6 each 3   ezetimibe (ZETIA) 10 MG tablet TAKE ONE (1) TABLET ONCE DAILY 90 tablet 1   gabapentin (NEURONTIN) 300 MG capsule TAKE ONE (1) CAPSULE THREE (3) TIMES EACH DAY 270 capsule 2   insulin aspart (FIASP FLEXTOUCH) 100 UNIT/ML FlexTouch Pen Dose AT THE TIME OF MEAL as follows: 100-150 = 4 U, 151-200 = 8 U, 201-250 = 16 U, 251-300 = 20 U, Greater than 300 = 24 U. 15 mL 2   LANTUS SOLOSTAR 100 UNIT/ML Solostar Pen Inject 65 Units into the skin daily. 60 mL 0   meclizine (ANTIVERT) 25 MG tablet Take 1 tablet (25 mg total) by mouth 3 (three) times daily as needed for dizziness. 30 tablet 2   meloxicam (MOBIC) 15 MG tablet Take 1 tablet (15 mg total) by mouth daily. 30 tablet 0   metoprolol succinate (TOPROL-XL) 25 MG 24 hr tablet TAKE ONE (1) TABLET ONCE DAILY 90 tablet 1   ofloxacin (OCUFLOX) 0.3 % ophthalmic solution      omega-3 acid ethyl esters (LOVAZA) 1 g capsule Take 2 capsules (2 g total)  by mouth 2 (two) times daily. 360 capsule 0   solifenacin (VESICARE) 10 MG tablet Take 10 mg by mouth daily.     tamsulosin (FLOMAX) 0.4 MG CAPS capsule Take 0.8 mg by mouth at bedtime.     zolpidem (AMBIEN) 10 MG tablet Take 10 mg by mouth at bedtime.      No current facility-administered medications on file prior to visit.   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   Past Surgical History:  Procedure Laterality Date   heart stents     x 3   left cataract  10/2019   right cataract Right 08/2019    Family History  Problem Relation Age of Onset   Endometrial cancer Mother    CAD Mother    AAA (abdominal aortic aneurysm) Mother    Healthy Father    Heart attack Brother    Social History   Socioeconomic History   Marital status: Married    Spouse name: Not on file  Number of children: 1   Years of education: 12   Highest education level: High school graduate  Occupational History   Occupation: Retired  Tobacco Use   Smoking status: Former    Types: Cigarettes    Quit date: 2003    Years since quitting: 19.8   Smokeless tobacco: Never  Vaping Use   Vaping Use: Never used  Substance and Sexual Activity   Alcohol use: No   Drug use: Never   Sexual activity: Not on file  Other Topics Concern   Not on file  Social History Narrative   Lives at home with his wife.   2 cups caffeine per day.   Right-handed.   Social Determinants of Health   Financial Resource Strain: Not on file  Food Insecurity: Not on file  Transportation Needs: Not on file  Physical Activity: Not on file  Stress: Not on file  Social Connections: Not on file    Review of Systems  Constitutional:  Negative  for appetite change, fatigue and fever.  HENT:  Negative for congestion, ear pain and sore throat.   Respiratory:  Negative for cough and shortness of breath.   Cardiovascular:  Negative for chest pain and leg swelling.  Gastrointestinal:  Negative for abdominal pain, constipation, diarrhea, nausea and vomiting.  Genitourinary:  Negative for dysuria and frequency.  Musculoskeletal:  Negative for arthralgias and myalgias.  Neurological:  Negative for dizziness and headaches.  Psychiatric/Behavioral:  Negative for dysphoric mood. The patient is not nervous/anxious.     Objective:  BP 118/72   Pulse 93   Temp (!) 97.4 F (36.3 C) (Temporal)   Ht 5\' 7"  (1.702 m)   Wt 219 lb (99.3 kg)   SpO2 98%   BMI 34.30 kg/m   BP/Weight 05/26/2021 11/28/4079 11/15/8183  Systolic BP 631 497 026  Diastolic BP 72 72 68  Wt. (Lbs) 219 223 227.2  BMI 34.3 34.93 35.58    Physical Exam Vitals reviewed.  Constitutional:      Appearance: Normal appearance. He is obese.  Neck:     Vascular: No carotid bruit.  Cardiovascular:     Rate and Rhythm: Normal rate and regular rhythm.     Heart sounds: Normal heart sounds.  Pulmonary:     Effort: Pulmonary effort is normal.     Breath sounds: Normal breath sounds. No wheezing, rhonchi or rales.  Abdominal:     General: Bowel sounds are normal.     Palpations: Abdomen is soft.     Tenderness: There is no abdominal tenderness.  Neurological:     Mental Status: He is alert.  Psychiatric:        Mood and Affect: Mood normal.        Behavior: Behavior normal.    Diabetic Foot Exam - Simple   No data filed      Lab Results  Component Value Date   WBC 8.8 04/25/2021   HGB 12.9 (L) 04/25/2021   HCT 38.9 04/25/2021   PLT 193 04/25/2021   GLUCOSE 163 (H) 04/25/2021   CHOL 140 04/25/2021   TRIG 113 04/25/2021   HDL 26 (L) 04/25/2021   LDLCALC 93 04/25/2021   ALT 16 04/25/2021   AST 17 04/25/2021   NA 139 04/25/2021   K 4.8 04/25/2021   CL 102  04/25/2021   CREATININE 0.83 04/25/2021   BUN 12 04/25/2021   CO2 25 04/25/2021   TSH 0.687 06/28/2020   HGBA1C 8.1 (H) 04/25/2021  MICROALBUR 10 10/17/2020      Assessment & Plan:   Problem List Items Addressed This Visit       Cardiovascular and Mediastinum   Hypertensive heart disease without heart failure    Start lisinopril 2.5 mg once daily.       Relevant Medications   lisinopril (ZESTRIL) 2.5 MG tablet     Endocrine   Diabetic polyneuropathy (HCC) - Primary    Increase Trulicity to 3 mg once weekly. Continue Lantus at 65 units daily. Continue farxiga 5 mg daily. Continue Fiasp (short acting insulin) with sliding scale prior to meals.      Relevant Medications   Dulaglutide (TRULICITY) 3 BW/3.8LH SOPN   lisinopril (ZESTRIL) 2.5 MG tablet   dapagliflozin propanediol (FARXIGA) 5 MG TABS tablet   Other Visit Diagnoses     Need for vaccination against Streptococcus pneumoniae       Relevant Orders   Pneumococcal conjugate vaccine 20-valent (Prevnar 20) (Completed)     .  Meds ordered this encounter  Medications   Dulaglutide (TRULICITY) 3 TD/4.2AJ SOPN    Sig: Inject 3 mg as directed once a week.    Dispense:  6 mL    Refill:  0   DISCONTD: lisinopril (ZESTRIL) 10 MG tablet    Sig: Take 1 tablet (10 mg total) by mouth daily.    Dispense:  90 tablet    Refill:  0   lisinopril (ZESTRIL) 2.5 MG tablet    Sig: Once daily    Dispense:  90 tablet    Refill:  0    Cancel lisinopril 10 mg once daily rx.   dapagliflozin propanediol (FARXIGA) 5 MG TABS tablet    Sig: Take 1 tablet (5 mg total) by mouth daily.    Dispense:  90 tablet    Refill:  0    Orders Placed This Encounter  Procedures   Pneumococcal conjugate vaccine 20-valent (Prevnar 20)     Follow-up: Return in about 2 months (around 07/26/2021) for chronic fasting.  An After Visit Summary was printed and given to the patient.    I,Lauren M Auman,acting as a scribe for [name],  MD.,have documented all relevant documentation on the behalf of [name], MD,as directed by  [name], MD while in the presence of [name], MD.    [name], MD [place] [phone]

## 2021-05-26 NOTE — Assessment & Plan Note (Deleted)
Increase lisinopril to 10 mg once daily.

## 2021-05-26 NOTE — Patient Instructions (Addendum)
Diabetes: Increase Trulicity to 3 mg once weekly. Continue Lantus at 65 units daily. Continue farxiga 5 mg daily. Continue Fiasp (short acting insulin) with sliding scale prior to meals.

## 2021-05-26 NOTE — Assessment & Plan Note (Signed)
Increase Trulicity to 3 mg once weekly. Continue Lantus at 65 units daily. Continue farxiga 5 mg daily. Continue Fiasp (short acting insulin) with sliding scale prior to meals.

## 2021-05-26 NOTE — Assessment & Plan Note (Signed)
>>  ASSESSMENT AND PLAN FOR DIABETIC POLYNEUROPATHY (HCC) WRITTEN ON 05/26/2021  9:57 AM BY [facility], KIRSTEN, MD  Increase Trulicity to 3 mg once weekly. Continue Lantus at 65 units daily. Continue farxiga 5 mg daily. Continue Fiasp (short acting insulin) with sliding scale prior to meals.

## 2021-05-27 DIAGNOSIS — E1165 Type 2 diabetes mellitus with hyperglycemia: Secondary | ICD-10-CM | POA: Diagnosis not present

## 2021-05-27 DIAGNOSIS — E118 Type 2 diabetes mellitus with unspecified complications: Secondary | ICD-10-CM | POA: Diagnosis not present

## 2021-05-28 ENCOUNTER — Encounter: Payer: Self-pay | Admitting: Family Medicine

## 2021-06-02 ENCOUNTER — Telehealth: Payer: Self-pay

## 2021-06-02 NOTE — Chronic Care Management (AMB) (Addendum)
Chronic Care Management Pharmacy Assistant   Name: Joshua Nelson  MRN: [id number] DOB: February 11, 1951   Reason for Encounter: Disease State/ Diabetes   Recent office visits:  04-25-2021 Joshua Brome, MD. START farxiga 5 mg daily, Trulicity 2.13 mg weekly for 2 weeks then increase to 1.5 mg weekly for 2 weeks. STOP janumet and glipizide. Hemo= 12.9, EOS absolute= 0.5. Glucose= 163. A1C= 8.1. HDL= 26, CHL/HDL= 5.4.   05-26-2021 Nelson, Kirsten, MD. INCREASE trulicity to 3 mg weekly.  Recent consult visits:  04-25-2021 [employer]. Cologuard.  Hospital visits:  None in previous 6 months  Medications: Outpatient Encounter Medications as of 06/02/2021  Medication Sig   atorvastatin (LIPITOR) 80 MG tablet TAKE ONE (1) TABLET ONCE DAILY   Continuous Blood Gluc Receiver (FREESTYLE LIBRE 2 READER) DEVI Check qac and qhs   Continuous Blood Gluc Sensor (FREESTYLE LIBRE 2 SENSOR) MISC 1 each by Does not apply route every 14 (fourteen) days.   dapagliflozin propanediol (FARXIGA) 5 MG TABS tablet Take 1 tablet (5 mg total) by mouth daily.   Dulaglutide (TRULICITY) 3 YQ/6.5HQ SOPN Inject 3 mg as directed once a week.   ezetimibe (ZETIA) 10 MG tablet TAKE ONE (1) TABLET ONCE DAILY   gabapentin (NEURONTIN) 300 MG capsule TAKE ONE (1) CAPSULE THREE (3) TIMES EACH DAY   insulin aspart (FIASP FLEXTOUCH) 100 UNIT/ML FlexTouch Pen Dose AT THE TIME OF MEAL as follows: 100-150 = 4 U, 151-200 = 8 U, 201-250 = 16 U, 251-300 = 20 U, Greater than 300 = 24 U.   LANTUS SOLOSTAR 100 UNIT/ML Solostar Pen Inject 65 Units into the skin daily.   lisinopril (ZESTRIL) 2.5 MG tablet Once daily   meclizine (ANTIVERT) 25 MG tablet Take 1 tablet (25 mg total) by mouth 3 (three) times daily as needed for dizziness.   meloxicam (MOBIC) 15 MG tablet Take 1 tablet (15 mg total) by mouth daily.   metoprolol succinate (TOPROL-XL) 25 MG 24 hr tablet TAKE ONE (1) TABLET ONCE DAILY   ofloxacin (OCUFLOX) 0.3 %  ophthalmic solution    omega-3 acid ethyl esters (LOVAZA) 1 g capsule Take 2 capsules (2 g total) by mouth 2 (two) times daily.   solifenacin (VESICARE) 10 MG tablet Take 10 mg by mouth daily.   tamsulosin (FLOMAX) 0.4 MG CAPS capsule Take 0.8 mg by mouth at bedtime.   zolpidem (AMBIEN) 10 MG tablet Take 10 mg by mouth at bedtime.    No facility-administered encounter medications on file as of 06/02/2021.  Recent Relevant Labs: Lab Results  Component Value Date/Time   HGBA1C 8.1 (H) 04/25/2021 09:45 AM   HGBA1C 8.4 (H) 01/18/2021 09:12 AM   MICROALBUR 10 10/17/2020 09:50 AM   MICROALBUR 10 11/30/2019 09:50 AM    Kidney Function Lab Results  Component Value Date/Time   CREATININE 0.83 04/25/2021 09:45 AM   CREATININE 0.83 01/18/2021 09:12 AM   GFRNONAA 91 06/28/2020 09:30 AM   GFRAA 105 06/28/2020 09:30 AM   Recent Relevant Labs: Lab Results  Component Value Date/Time   HGBA1C 8.1 (H) 04/25/2021 09:45 AM   HGBA1C 8.4 (H) 01/18/2021 09:12 AM   MICROALBUR 10 10/17/2020 09:50 AM   MICROALBUR 10 11/30/2019 09:50 AM    Kidney Function Lab Results  Component Value Date/Time   CREATININE 0.83 04/25/2021 09:45 AM   CREATININE 0.83 01/18/2021 09:12 AM   GFRNONAA 91 06/28/2020 09:30 AM   GFRAA 105 06/28/2020 09:30 AM      06-02-2021: 1st attempt  left VM 06-05-2021: 2nd attempt left VM 06-06-2021: 3rd attempt left VM    Star Rating Drugs: Lisinopril 2.5 mg- Last filled 05-16-2021 90 DS Farxiga 5 mg- Last filled [id number] 30 DS Trulicity 3 mg- Last filled 05-26-2021 28 DS Atorvastatin 80 mg- Last filled 04-21-2021 90 DS  [place] Clinical Pharmacist Assistant [phone]

## 2021-06-04 ENCOUNTER — Encounter: Payer: Self-pay | Admitting: Family Medicine

## 2021-06-04 NOTE — Assessment & Plan Note (Signed)
Start lisinopril 2.5 mg once daily.

## 2021-06-07 ENCOUNTER — Other Ambulatory Visit: Payer: Self-pay

## 2021-06-07 ENCOUNTER — Encounter: Payer: Self-pay | Admitting: Legal Medicine

## 2021-06-07 ENCOUNTER — Ambulatory Visit (INDEPENDENT_AMBULATORY_CARE_PROVIDER_SITE_OTHER): Payer: Medicare HMO | Admitting: Legal Medicine

## 2021-06-07 VITALS — BP 120/80 | HR 97 | Temp 97.3°F | Resp 16 | Ht 67.0 in | Wt 217.0 lb

## 2021-06-07 DIAGNOSIS — E1142 Type 2 diabetes mellitus with diabetic polyneuropathy: Secondary | ICD-10-CM

## 2021-06-07 DIAGNOSIS — L02612 Cutaneous abscess of left foot: Secondary | ICD-10-CM

## 2021-06-07 DIAGNOSIS — L03032 Cellulitis of left toe: Secondary | ICD-10-CM | POA: Diagnosis not present

## 2021-06-07 DIAGNOSIS — M10072 Idiopathic gout, left ankle and foot: Secondary | ICD-10-CM

## 2021-06-07 MED ORDER — CEPHALEXIN 500 MG PO CAPS: 500.0000 mg | ORAL_CAPSULE | Freq: Four times a day (QID) | ORAL | 0 refills | Status: DC

## 2021-06-07 MED ORDER — COLCHICINE 0.6 MG PO CAPS: 1.0000 | ORAL_CAPSULE | Freq: Two times a day (BID) | ORAL | 2 refills | Status: DC

## 2021-06-07 NOTE — Progress Notes (Signed)
Acute Office Visit  Subjective:    Patient ID: Joshua Nelson, male    DOB: [birth year], 70 y.o.   MRN: [id number]  Chief Complaint  Patient presents with   Foot Swelling    Since 4 days ago, patient noticed swelling and it is sore on 4th toe of the left foot.    HPI: Patient is in today for swelling, and redness on 4th and 5th toes of the left foot. Patient mentioned throbbing pain and very tender to the touch since 4 days ago. Patient has an appointment with podiatry 06/16/2021. Patient has dm with foot neuropathy. A1c 8.1.  we discussed need for foot care, has no diabetic shoes, he is to see podiatry next week.   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    Past Surgical History:  Procedure Laterality Date   heart stents     x 3   left cataract  10/2019   right cataract Right 08/2019    Family History  Problem Relation Age of Onset   Endometrial cancer Mother    CAD Mother    AAA (abdominal aortic aneurysm) Mother    Healthy Father    Heart attack Brother     Social History   Socioeconomic History   Marital status: Married    Spouse name: Not on file   Number of children: 1   Years of education: 12   Highest education level: High school graduate  Occupational History   Occupation: Retired  Tobacco Use   Smoking status: Former    Types: Cigarettes    Quit date: 2003    Years since quitting: 19.8   Smokeless tobacco: Never  Vaping Use   Vaping Use: Never used  Substance and Sexual Activity   Alcohol use: No   Drug use: Never   Sexual activity: Not on file  Other Topics Concern   Not on file  Social History Narrative   Lives at home with his wife.   2 cups caffeine per  day.   Right-handed.   Social Determinants of Health   Financial Resource Strain: Not on file  Food Insecurity: Not on file  Transportation Needs: Not on file  Physical Activity: Not on file  Stress: Not on file  Social Connections: Not on file  Intimate Partner Violence: Not on file    Outpatient Medications Prior to Visit  Medication Sig Dispense Refill   atorvastatin (LIPITOR) 80 MG tablet TAKE ONE (1) TABLET ONCE DAILY 90 tablet 0   Continuous Blood Gluc Receiver (FREESTYLE LIBRE 2 READER) DEVI Check qac and qhs 1 each 0   Continuous Blood Gluc Sensor (FREESTYLE LIBRE 2 SENSOR) MISC 1 each by Does not apply route every 14 (fourteen) days. 6 each 3   dapagliflozin propanediol (FARXIGA) 5 MG TABS tablet Take 1 tablet (5 mg total) by mouth daily. 90 tablet 0   Dulaglutide (TRULICITY) 3 RC/7.8LF SOPN Inject 3 mg as directed once a week. 6 mL 0   ezetimibe (ZETIA) 10 MG tablet TAKE ONE (1) TABLET ONCE DAILY 90 tablet 1   gabapentin (NEURONTIN) 300 MG capsule TAKE ONE (1) CAPSULE THREE (3) TIMES EACH DAY 270 capsule 2  insulin aspart (FIASP FLEXTOUCH) 100 UNIT/ML FlexTouch Pen Dose AT THE TIME OF MEAL as follows: 100-150 = 4 U, 151-200 = 8 U, 201-250 = 16 U, 251-300 = 20 U, Greater than 300 = 24 U. 15 mL 2   LANTUS SOLOSTAR 100 UNIT/ML Solostar Pen Inject 65 Units into the skin daily. 60 mL 0   lisinopril (ZESTRIL) 2.5 MG tablet Once daily 90 tablet 0   meclizine (ANTIVERT) 25 MG tablet Take 1 tablet (25 mg total) by mouth 3 (three) times daily as needed for dizziness. 30 tablet 2   meloxicam (MOBIC) 15 MG tablet Take 1 tablet (15 mg total) by mouth daily. 30 tablet 0   metoprolol succinate (TOPROL-XL) 25 MG 24 hr tablet TAKE ONE (1) TABLET ONCE DAILY 90 tablet 1   ofloxacin (OCUFLOX) 0.3 % ophthalmic solution      omega-3 acid ethyl esters (LOVAZA) 1 g capsule Take 2 capsules (2 g total) by mouth 2 (two) times daily. 360 capsule 0   solifenacin (VESICARE) 10 MG tablet Take 10 mg by  mouth daily.     tamsulosin (FLOMAX) 0.4 MG CAPS capsule Take 0.8 mg by mouth at bedtime.     zolpidem (AMBIEN) 10 MG tablet Take 10 mg by mouth at bedtime.      No facility-administered medications prior to visit.    No Known Allergies  Review of Systems  Constitutional:  Negative for chills, fatigue, fever and unexpected weight change.  HENT:  Negative for congestion, ear pain, sinus pain and sore throat.   Respiratory:  Negative for cough and shortness of breath.   Cardiovascular:  Negative for chest pain and palpitations.  Gastrointestinal:  Negative for abdominal pain, blood in stool, constipation, diarrhea, nausea and vomiting.  Endocrine: Negative for polydipsia.  Genitourinary:  Negative for dysuria.  Musculoskeletal:  Positive for arthralgias and joint swelling. Negative for back pain.  Skin:  Positive for color change (Redness). Negative for rash.  Neurological:  Negative for headaches.         Objective:    Physical Exam Vitals reviewed.  Constitutional:      Appearance: Normal appearance. He is obese.  HENT:     Right Ear: Tympanic membrane, ear canal and external ear normal.     Left Ear: Tympanic membrane, ear canal and external ear normal.     Mouth/Throat:     Mouth: Mucous membranes are moist.     Pharynx: Oropharynx is clear.  Eyes:     Conjunctiva/sclera: Conjunctivae normal.     Pupils: Pupils are equal, round, and reactive to light.  Cardiovascular:     Rate and Rhythm: Normal rate and regular rhythm.     Pulses: Normal pulses.     Heart sounds: Normal heart sounds. No murmur heard. Pulmonary:     Effort: Pulmonary effort is normal. No respiratory distress.     Breath sounds: Normal breath sounds. No wheezing.  Abdominal:     General: Abdomen is flat. Bowel sounds are normal. There is no distension.     Tenderness: There is abdominal tenderness.  Musculoskeletal:     Cervical back: Normal range of motion.  Skin:    Capillary Refill: Capillary  refill takes less than 2 seconds.     Findings: Erythema present.     Comments: Left 4th does red and moist between 4th and 5th toes  Neurological:     Mental Status: He is alert and oriented to person, place, and time.  Sensory: Sensory deficit (both feet) present.    BP 120/80   Pulse 97   Temp (!) 97.3 F (36.3 C)   Resp 16   Ht _0  (1.702 m)   Wt 217 lb (98.4 kg)   SpO2 98%   BMI 33.99 kg/m  Wt Readings from Last 3 Encounters:  06/07/21 217 lb (98.4 kg)  05/26/21 219 lb (99.3 kg)  04/25/21 223 lb (101.2 kg)    Health Maintenance Due  Topic Date Due   TETANUS/TDAP  Never done   Zoster Vaccines- Shingrix (1 of 2) Never done   COLONOSCOPY (Pts 45-64yr Insurance coverage will need to be confirmed)  Never done   OPHTHALMOLOGY EXAM  09/24/2020    There are no preventive care reminders to display for this patient.   Lab Results  Component Value Date   TSH 0.687 06/28/2020   Lab Results  Component Value Date   WBC 8.8 04/25/2021   HGB 12.9 (L) 04/25/2021   HCT 38.9 04/25/2021   MCV 87 04/25/2021   PLT 193 04/25/2021   Lab Results  Component Value Date   NA 139 04/25/2021   K 4.8 04/25/2021   CO2 25 04/25/2021   GLUCOSE 163 (H) 04/25/2021   BUN 12 04/25/2021   CREATININE 0.83 04/25/2021   BILITOT 0.8 04/25/2021   ALKPHOS 61 04/25/2021   AST 17 04/25/2021   ALT 16 04/25/2021   PROT 6.7 04/25/2021   ALBUMIN 4.5 04/25/2021   CALCIUM 9.5 04/25/2021   ANIONGAP 7 12/07/2014   EGFR 95 04/25/2021   Lab Results  Component Value Date   CHOL 140 04/25/2021   Lab Results  Component Value Date   HDL 26 (L) 04/25/2021   Lab Results  Component Value Date   LDLCALC 93 04/25/2021   Lab Results  Component Value Date   TRIG 113 04/25/2021   Lab Results  Component Value Date   CHOLHDL 5.4 (H) 04/25/2021   Lab Results  Component Value Date   HGBA1C 8.1 (H) 04/25/2021       Assessment & Plan:   Problem List Items Addressed This Visit        Endocrine   [place]abetic [place]neuropathy ([place]   Relevant Orders   For Home Use Only DME Diabetic Shoe An individual care plan for diabetes was established and reinforced today.  The patient's status was assessed using clinical findings on exam, labs and diagnostic testing. Patient success at meeting goals based on disease specific evidence-based guidelines and found to be poor controlled. Medications were assessed and patient's understanding of the medical issues , including barriers were assessed. Recommend adherence to a diabetic diet, a graduated exercise program, HgbA1c level is checked quarterly, and urine microalbumin performed yearly .  Annual mono-filament sensation testing performed. Lower blood pressure and control hyperlipidemia is important. Get annual eye exams and annual flu shots and smoking cessation discussed.  Self management goals were discussed.    Other Visit Diagnoses     Acute idiopathic gout involving toe of left foot    -  Primary   Relevant Medications   Colchicine (MITIGARE) 0.6 MG CAPS Patient has red 4th toe possible gout.  5 days on colchicine   Other Relevant Orders   Uric acid   Cellulitis and abscess of toe of left foot       Relevant Medications   cephALEXin (KEFLEX) 500 MG capsule Cellulitis in diabetic foot with neuropathy, treat with antibiotics      Meds ordered this  encounter  Medications   cephALEXin (KEFLEX) 500 MG capsule    Sig: Take 1 capsule (500 mg total) by mouth 4 (four) times daily.    Dispense:  40 capsule    Refill:  0   Colchicine (MITIGARE) 0.6 MG CAPS    Sig: Take 1 capsule by mouth 2 (two) times daily for 5 days.    Dispense:  30 capsule    Refill:  2     Orders Placed This Encounter  Procedures   For Home Use Only DME Diabetic Shoe   Uric acid      Follow-up: Return in about 1 week (around 06/14/2021) for cellulitis.  An After Visit Summary was printed and given to the patient.  Reinaldo Meeker, MD [facility] [phone]

## 2021-06-08 LAB — URIC ACID: Uric Acid: 3.9 mg/dL (ref 3.8–8.4)

## 2021-06-08 NOTE — Progress Notes (Signed)
Uric acid 3.9 low normal, continue on medicines lp

## 2021-06-12 DIAGNOSIS — N401 Enlarged prostate with lower urinary tract symptoms: Secondary | ICD-10-CM | POA: Diagnosis not present

## 2021-06-12 DIAGNOSIS — N481 Balanitis: Secondary | ICD-10-CM | POA: Diagnosis not present

## 2021-06-12 DIAGNOSIS — N3941 Urge incontinence: Secondary | ICD-10-CM | POA: Diagnosis not present

## 2021-06-12 DIAGNOSIS — R350 Frequency of micturition: Secondary | ICD-10-CM | POA: Diagnosis not present

## 2021-06-12 DIAGNOSIS — R351 Nocturia: Secondary | ICD-10-CM | POA: Diagnosis not present

## 2021-06-14 ENCOUNTER — Ambulatory Visit (INDEPENDENT_AMBULATORY_CARE_PROVIDER_SITE_OTHER): Payer: Medicare HMO | Admitting: Legal Medicine

## 2021-06-14 ENCOUNTER — Encounter: Payer: Self-pay | Admitting: Legal Medicine

## 2021-06-14 ENCOUNTER — Other Ambulatory Visit: Payer: Self-pay

## 2021-06-14 VITALS — BP 120/64 | HR 96 | Temp 97.4°F | Resp 16 | Ht 67.0 in | Wt 214.0 lb

## 2021-06-14 DIAGNOSIS — L97529 Non-pressure chronic ulcer of other part of left foot with unspecified severity: Secondary | ICD-10-CM | POA: Insufficient documentation

## 2021-06-14 DIAGNOSIS — L02612 Cutaneous abscess of left foot: Secondary | ICD-10-CM

## 2021-06-14 DIAGNOSIS — M10072 Idiopathic gout, left ankle and foot: Secondary | ICD-10-CM | POA: Diagnosis not present

## 2021-06-14 DIAGNOSIS — L03032 Cellulitis of left toe: Secondary | ICD-10-CM

## 2021-06-14 DIAGNOSIS — L97521 Non-pressure chronic ulcer of other part of left foot limited to breakdown of skin: Secondary | ICD-10-CM

## 2021-06-14 NOTE — Progress Notes (Signed)
1111  Subjective:  Patient ID: Joshua Nelson, male    DOB: [birth year]  Age: 70 y.o. MRN: [id number]  Chief Complaint  Patient presents with   Wound Check   Gout    HPI ulcer left 4th toe, sless redness still painful no dainge, stage 1.he was only taking on colchicine a day.   Current Outpatient Medications on File Prior to Visit  Medication Sig Dispense Refill   atorvastatin (LIPITOR) 80 MG tablet TAKE ONE (1) TABLET ONCE DAILY 90 tablet 0   cephALEXin (KEFLEX) 500 MG capsule Take 1 capsule (500 mg total) by mouth 4 (four) times daily. 40 capsule 0   clotrimazole-betamethasone (LOTRISONE) cream Apply topically.     Continuous Blood Gluc Receiver (FREESTYLE LIBRE 2 READER) DEVI Check qac and qhs 1 each 0   Continuous Blood Gluc Sensor (FREESTYLE LIBRE 2 SENSOR) MISC 1 each by Does not apply route every 14 (fourteen) days. 6 each 3   dapagliflozin propanediol (FARXIGA) 5 MG TABS tablet Take 1 tablet (5 mg total) by mouth daily. 90 tablet 0   Dulaglutide (TRULICITY) 3 YB/0.1BP SOPN Inject 3 mg as directed once a week. 6 mL 0   ezetimibe (ZETIA) 10 MG tablet TAKE ONE (1) TABLET ONCE DAILY 90 tablet 1   gabapentin (NEURONTIN) 300 MG capsule TAKE ONE (1) CAPSULE THREE (3) TIMES EACH DAY 270 capsule 2   insulin aspart (FIASP FLEXTOUCH) 100 UNIT/ML FlexTouch Pen Dose AT THE TIME OF MEAL as follows: 100-150 = 4 U, 151-200 = 8 U, 201-250 = 16 U, 251-300 = 20 U, Greater than 300 = 24 U. 15 mL 2   LANTUS SOLOSTAR 100 UNIT/ML Solostar Pen Inject 65 Units into the skin daily. 60 mL 0   lisinopril (ZESTRIL) 2.5 MG tablet Once daily 90 tablet 0   meclizine (ANTIVERT) 25 MG tablet Take 1 tablet (25 mg total) by mouth 3 (three) times daily as needed for dizziness. 30 tablet 2   meloxicam (MOBIC) 15 MG tablet Take 1 tablet (15 mg total) by mouth daily. 30 tablet 0   metoprolol succinate (TOPROL-XL) 25 MG 24 hr tablet TAKE ONE (1) TABLET ONCE DAILY 90 tablet 1   ofloxacin (OCUFLOX) 0.3 % ophthalmic solution       omega-3 acid ethyl esters (LOVAZA) 1 g capsule Take 2 capsules (2 g total) by mouth 2 (two) times daily. 360 capsule 0   sildenafil (VIAGRA) 100 MG tablet SMARTSIG:0.5-1 Tablet(s) By Mouth     solifenacin (VESICARE) 10 MG tablet Take 10 mg by mouth daily.     tamsulosin (FLOMAX) 0.4 MG CAPS capsule Take 0.8 mg by mouth at bedtime.     zolpidem (AMBIEN) 10 MG tablet Take 10 mg by mouth at bedtime.      Colchicine (MITIGARE) 0.6 MG CAPS Take 1 capsule by mouth 2 (two) times daily for 5 days. 30 capsule 2   No current facility-administered medications on file prior to visit.   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   Past Surgical History:  Procedure Laterality Date   heart stents     x 3   left cataract  10/2019   right cataract Right 08/2019  Family History  Problem Relation Age of Onset   Endometrial cancer Mother    CAD Mother    AAA (abdominal aortic aneurysm) Mother    Healthy Father    Heart attack Brother    Social History   Socioeconomic History   Marital status: Married    Spouse name: Not on file   Number of children: 1   Years of education: 12   Highest education level: High school graduate  Occupational History   Occupation: Retired  Tobacco Use   Smoking status: Former    Types: Cigarettes    Quit date: 2003    Years since quitting: 19.8   Smokeless tobacco: Never  Vaping Use   Vaping Use: Never used  Substance and Sexual Activity   Alcohol use: No   Drug use: Never   Sexual activity: Not on file  Other Topics Concern   Not on file  Social History Narrative   Lives at home with his wife.   2 cups caffeine per day.   Right-handed.   Social Determinants  of Health   Financial Resource Strain: Not on file  Food Insecurity: Not on file  Transportation Needs: Not on file  Physical Activity: Not on file  Stress: Not on file  Social Connections: Not on file    Review of Systems  Constitutional:  Negative for chills, fatigue, fever and unexpected weight change.  HENT:  Negative for congestion, ear pain, sinus pain and sore throat.   Respiratory:  Negative for cough and shortness of breath.   Cardiovascular:  Negative for chest pain and palpitations.  Gastrointestinal:  Negative for abdominal pain, blood in stool, constipation, diarrhea, nausea and vomiting.  Endocrine: Negative for polydipsia.  Genitourinary:  Negative for dysuria.  Musculoskeletal:  Positive for arthralgias (Left foot pain). Negative for back pain.  Skin:  Positive for color change and wound. Negative for rash.       59mm ulcer outside 4th toe  Neurological:  Negative for headaches.    Objective:  BP 120/64   Pulse 96   Temp (!) 97.4 F (36.3 C)   Resp 16   Ht 5\' 7"  (1.702 m)   Wt 214 lb (97.1 kg)   SpO2 97%   BMI 33.52 kg/m   BP/Weight 06/14/2021 06/07/2021 65/99/3570  Systolic BP 177 939 030  Diastolic BP 64 80 72  Wt. (Lbs) 214 217 219  BMI 33.52 33.99 34.3    Physical Exam Vitals reviewed.  Constitutional:      Appearance: Normal appearance. He is obese.  HENT:     Right Ear: Tympanic membrane, ear canal and external ear normal.     Left Ear: Tympanic membrane, ear canal and external ear normal.     Mouth/Throat:     Mouth: Mucous membranes are moist.     Pharynx: Oropharynx is clear.  Eyes:     Conjunctiva/sclera: Conjunctivae normal.     Pupils: Pupils are equal, round, and reactive to light.  Cardiovascular:     Rate and Rhythm: Normal rate and regular rhythm.     Pulses: Normal pulses.     Heart sounds: Normal heart sounds. No murmur heard. Pulmonary:     Effort: Pulmonary effort is normal. No respiratory distress.     Breath sounds:  Normal breath sounds. No wheezing.  Abdominal:     General: Abdomen is flat. Bowel sounds are normal. There is no distension.     Tenderness: There is no abdominal tenderness.  Skin:  Capillary Refill: Capillary refill takes less than 2 seconds.     Findings: Erythema present.     Comments: 52mm ulcer left 2nd toe laterally  Neurological:     General: No focal deficit present.     Mental Status: He is alert and oriented to person, place, and time. Mental status is at baseline.        Lab Results  Component Value Date   WBC 8.8 04/25/2021   HGB 12.9 (L) 04/25/2021   HCT 38.9 04/25/2021   PLT 193 04/25/2021   GLUCOSE 163 (H) 04/25/2021   CHOL 140 04/25/2021   TRIG 113 04/25/2021   HDL 26 (L) 04/25/2021   LDLCALC 93 04/25/2021   ALT 16 04/25/2021   AST 17 04/25/2021   NA 139 04/25/2021   K 4.8 04/25/2021   CL 102 04/25/2021   CREATININE 0.83 04/25/2021   BUN 12 04/25/2021   CO2 25 04/25/2021   TSH 0.687 06/28/2020   HGBA1C 8.1 (H) 04/25/2021   MICROALBUR 10 10/17/2020      Assessment & Plan:   Problem List Items Addressed This Visit       Other   Ulcer of left foot ([place]) Patient has ulcer left 4th toe, dress   Other Visit Diagnoses     Acute idiopathic gout involving toe of left foot    -  Primary High uric acid level, start allopurinol    Cellulitis and abscess of toe of left foot     Patient still on antibiotics     .         Follow-up: Return in about 1 week (around 06/21/2021).  An After Visit Summary was printed and given to the patient.  Reinaldo Meeker, MD [facility] [phone]

## 2021-06-19 ENCOUNTER — Ambulatory Visit: Payer: Medicare HMO | Admitting: Podiatry

## 2021-06-19 ENCOUNTER — Other Ambulatory Visit: Payer: Self-pay

## 2021-06-19 DIAGNOSIS — E1169 Type 2 diabetes mellitus with other specified complication: Secondary | ICD-10-CM | POA: Diagnosis not present

## 2021-06-19 DIAGNOSIS — E1142 Type 2 diabetes mellitus with diabetic polyneuropathy: Secondary | ICD-10-CM | POA: Diagnosis not present

## 2021-06-19 DIAGNOSIS — B351 Tinea unguium: Secondary | ICD-10-CM

## 2021-06-19 DIAGNOSIS — M2041 Other hammer toe(s) (acquired), right foot: Secondary | ICD-10-CM

## 2021-06-19 DIAGNOSIS — M2042 Other hammer toe(s) (acquired), left foot: Secondary | ICD-10-CM

## 2021-06-19 NOTE — Progress Notes (Signed)
  Subjective:  Patient ID: Joshua Nelson, male    DOB: 1950-10-25,  MRN: [id number]  Chief Complaint  Patient presents with   debride    DFC -FBS: 268 a1C: na pPC: [facility] x [facility]    70 y.o. male presents with the above complaint. History confirmed with patient.   Objective:  Physical Exam: warm, good capillary refill, nail exam onychomycosis of the toenails, no trophic changes or ulcerative lesions. DP pulses palpable, PT pulses palpable and protective sensation absent Hammertoes present bilateral feet.  No images are attached to the encounter.  Assessment:   1. Onychomycosis of multiple toenails with type 2 diabetes mellitus and peripheral neuropathy ([place])   2. Hammer toes of both feet    Plan:  Patient was evaluated and treated and all questions answered.  Onychomycosis, Diabetes and DPN  -DM foot exam performed. -Patient is diabetic with a qualifying condition for at risk foot care. -Casted for DM shoes today  Procedure: Nail Debridement Type of Debridement: manual, sharp debridement. Instrumentation: Nail nipper, rotary burr. Number of Nails: 10   Return in about 3 months (around 09/19/2021) for Diabetic Foot Care.

## 2021-06-20 ENCOUNTER — Ambulatory Visit: Payer: Medicare HMO | Admitting: Family Medicine

## 2021-06-22 ENCOUNTER — Other Ambulatory Visit: Payer: Self-pay | Admitting: Family Medicine

## 2021-06-27 DIAGNOSIS — E118 Type 2 diabetes mellitus with unspecified complications: Secondary | ICD-10-CM | POA: Diagnosis not present

## 2021-06-27 DIAGNOSIS — E1165 Type 2 diabetes mellitus with hyperglycemia: Secondary | ICD-10-CM | POA: Diagnosis not present

## 2021-07-12 ENCOUNTER — Encounter: Payer: Self-pay | Admitting: Nurse Practitioner

## 2021-07-12 ENCOUNTER — Other Ambulatory Visit: Payer: Self-pay

## 2021-07-12 ENCOUNTER — Ambulatory Visit (INDEPENDENT_AMBULATORY_CARE_PROVIDER_SITE_OTHER): Payer: Medicare HMO | Admitting: Nurse Practitioner

## 2021-07-12 VITALS — BP 150/78 | HR 95 | Temp 97.2°F | Ht 69.0 in | Wt 216.0 lb

## 2021-07-12 DIAGNOSIS — M542 Cervicalgia: Secondary | ICD-10-CM | POA: Diagnosis not present

## 2021-07-12 DIAGNOSIS — S161XXA Strain of muscle, fascia and tendon at neck level, initial encounter: Secondary | ICD-10-CM

## 2021-07-12 MED ORDER — PREDNISONE 10 MG PO TABS
10.0000 mg | ORAL_TABLET | Freq: Two times a day (BID) | ORAL | 0 refills | Status: DC
Start: 2021-07-12 — End: 2021-08-18

## 2021-07-12 MED ORDER — CYCLOBENZAPRINE HCL 5 MG PO TABS: 5.0000 mg | ORAL_TABLET | Freq: Three times a day (TID) | ORAL | 1 refills | Status: DC | PRN

## 2021-07-12 MED ORDER — IBUPROFEN 800 MG PO TABS: 800.0000 mg | ORAL_TABLET | Freq: Three times a day (TID) | ORAL | 0 refills | Status: DC | PRN

## 2021-07-12 NOTE — Progress Notes (Signed)
Acute Office Visit  Subjective:    Patient ID: Joshua Nelson, male    DOB: 03-Jun-1951, 70 y.o.   MRN: [id number]  CC: Neck pain   HPI: Patient is in today for Neck Pain  He reports new onset neck pain. There was not an injury that may have caused the pain. The most recent episode started  3 weeks ago and is staying constant. The pain is located in the back of neck over the cervical spine with radiation to right scapula. It is described as throbbing, is 10/10 in intensity, occurring intermittently.   Aggravating factors: turning head to the right Relieving factors: none.  He has tried application of heat, NSAIDs, and topical anesthetics with mild relief. Takes BC powders for relief.  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    Past Surgical History:  Procedure Laterality Date   heart stents     x 3   left cataract  10/2019   right cataract Right 08/2019    Family History  Problem Relation Age of Onset   Endometrial cancer Mother    CAD Mother    AAA (abdominal aortic aneurysm) Mother    Healthy Father    Heart attack Brother     Social History   Socioeconomic History   Marital status: Married    Spouse name: Not on file   Number of children: 1   Years of education: 12   Highest education level: High school graduate  Occupational History   Occupation: Retired  Tobacco Use   Smoking status: Former    Types: Cigarettes    Quit date: 2003    Years since quitting: 19.9   Smokeless tobacco:  Never  Vaping Use   Vaping Use: Never used  Substance and Sexual Activity   Alcohol use: No   Drug use: Never   Sexual activity: Not on file  Other Topics Concern   Not on file  Social History Narrative   Lives at home with his wife.   2 cups caffeine per day.   Right-handed.   Social Determinants of Health   Financial Resource Strain: Not on file  Food Insecurity: Not on file  Transportation Needs: Not on file  Physical Activity: Not on file  Stress: Not on file  Social Connections: Not on file  Intimate Partner Violence: Not on file    Outpatient Medications Prior to Visit  Medication Sig Dispense Refill   atorvastatin (LIPITOR) 80 MG tablet TAKE ONE (1) TABLET ONCE DAILY 90 tablet 0   cephALEXin (KEFLEX) 500 MG capsule Take 1 capsule (500 mg total) by mouth 4 (four) times daily. 40 capsule 0   clotrimazole-betamethasone (LOTRISONE) cream Apply topically.     Colchicine (MITIGARE) 0.6 MG CAPS Take 1 capsule by mouth 2 (two) times daily for 5 days. 30 capsule 2   Continuous Blood Gluc Receiver (FREESTYLE LIBRE 2 READER) DEVI Check qac and qhs 1 each 0   Continuous Blood  Gluc Sensor (FREESTYLE LIBRE 2 SENSOR) MISC 1 each by Does not apply route every 14 (fourteen) days. 6 each 3   dapagliflozin propanediol (FARXIGA) 5 MG TABS tablet Take 1 tablet (5 mg total) by mouth daily. 90 tablet 0   Dulaglutide (TRULICITY) 3 OI/7.8MV SOPN Inject 3 mg as directed once a week. 6 mL 0   ezetimibe (ZETIA) 10 MG tablet TAKE ONE (1) TABLET ONCE DAILY 90 tablet 1   gabapentin (NEURONTIN) 300 MG capsule TAKE ONE (1) CAPSULE THREE (3) TIMES EACH DAY 270 capsule 2   insulin aspart (FIASP FLEXTOUCH) 100 UNIT/ML FlexTouch Pen Dose AT THE TIME OF MEAL as follows: 100-150 = 4 U, 151-200 = 8 U, 201-250 = 16 U, 251-300 = 20 U, Greater than 300 = 24 U. 15 mL 2   LANTUS SOLOSTAR 100 UNIT/ML Solostar Pen Inject 65 Units into the skin daily. 60 mL 0   lisinopril (ZESTRIL) 2.5 MG tablet Once daily 90 tablet 0    Meclizine HCl 25 MG CHEW TAKE 1 TABLET THREE TIMES DAILY AS NEEDED FOR DIZZINESS 30 tablet 0   meloxicam (MOBIC) 15 MG tablet Take 1 tablet (15 mg total) by mouth daily. 30 tablet 0   metoprolol succinate (TOPROL-XL) 25 MG 24 hr tablet TAKE ONE (1) TABLET ONCE DAILY 90 tablet 1   ofloxacin (OCUFLOX) 0.3 % ophthalmic solution      omega-3 acid ethyl esters (LOVAZA) 1 g capsule Take 2 capsules (2 g total) by mouth 2 (two) times daily. 360 capsule 0   sildenafil (VIAGRA) 100 MG tablet SMARTSIG:0.5-1 Tablet(s) By Mouth     solifenacin (VESICARE) 10 MG tablet Take 10 mg by mouth daily.     tamsulosin (FLOMAX) 0.4 MG CAPS capsule Take 0.8 mg by mouth at bedtime.     zolpidem (AMBIEN) 10 MG tablet Take 10 mg by mouth at bedtime.      No facility-administered medications prior to visit.    No Known Allergies  Review of Systems  Constitutional:  Negative for chills and fever.  HENT:  Negative for congestion and sore throat.   Respiratory:  Negative for cough and shortness of breath.   Cardiovascular:  Negative for chest pain.  Musculoskeletal:  Positive for neck pain. Negative for back pain.      Objective:    Physical Exam Vitals reviewed.  HENT:     Head: Normocephalic.  Cardiovascular:     Rate and Rhythm: Normal rate and regular rhythm.     Pulses: Normal pulses.     Heart sounds: Normal heart sounds.  Pulmonary:     Effort: Pulmonary effort is normal.     Breath sounds: Normal breath sounds.  Abdominal:     General: Bowel sounds are normal.     Palpations: Abdomen is soft.  Musculoskeletal:        General: Tenderness (posterior right lower cervical area) present.     Cervical back: Tenderness (with cervical rotation and side bending right) present. No rigidity.  Skin:    General: Skin is warm and dry.     Capillary Refill: Capillary refill takes less than 2 seconds.  Neurological:     General: No focal deficit present.     Mental Status: He is alert and oriented to  person, place, and time.  Psychiatric:        Mood and Affect: Mood normal.        Behavior: Behavior normal.    BP (!) 150/78   Pulse 95  Temp (!) 97.2 F (36.2 C)   Ht _0  (1.753 m)   Wt 216 lb (98 kg)   SpO2 99%   BMI 31.90 kg/m   Wt Readings from Last 3 Encounters:  06/14/21 214 lb (97.1 kg)  06/07/21 217 lb (98.4 kg)  05/26/21 219 lb (99.3 kg)    Health Maintenance Due  Topic Date Due   TETANUS/TDAP  Never done   Zoster Vaccines- Shingrix (1 of 2) Never done   COLONOSCOPY (Pts 45-13yr Insurance coverage will need to be confirmed)  Never done   OPHTHALMOLOGY EXAM  09/24/2020     Lab Results  Component Value Date   TSH 0.687 06/28/2020   Lab Results  Component Value Date   WBC 8.8 04/25/2021   HGB 12.9 (L) 04/25/2021   HCT 38.9 04/25/2021   MCV 87 04/25/2021   PLT 193 04/25/2021   Lab Results  Component Value Date   NA 139 04/25/2021   K 4.8 04/25/2021   CO2 25 04/25/2021   GLUCOSE 163 (H) 04/25/2021   BUN 12 04/25/2021   CREATININE 0.83 04/25/2021   BILITOT 0.8 04/25/2021   ALKPHOS 61 04/25/2021   AST 17 04/25/2021   ALT 16 04/25/2021   PROT 6.7 04/25/2021   ALBUMIN 4.5 04/25/2021   CALCIUM 9.5 04/25/2021   ANIONGAP 7 12/07/2014   EGFR 95 04/25/2021   Lab Results  Component Value Date   CHOL 140 04/25/2021   Lab Results  Component Value Date   HDL 26 (L) 04/25/2021   Lab Results  Component Value Date   LDLCALC 93 04/25/2021   Lab Results  Component Value Date   TRIG 113 04/25/2021   Lab Results  Component Value Date   CHOLHDL 5.4 (H) 04/25/2021   Lab Results  Component Value Date   HGBA1C 8.1 (H) 04/25/2021       Assessment & Plan:   1. Strain of neck muscle, initial encounter - DG Neck Soft Tissue; Future - predniSONE (DELTASONE) 10 MG tablet; Take 1 tablet (10 mg total) by mouth 2 (two) times daily with a meal.  Dispense: 20 tablet; Refill: 0 - ibuprofen (ADVIL) 800 MG tablet; Take 1 tablet (800 mg total) by mouth  every 8 (eight) hours as needed for moderate pain.  Dispense: 30 tablet; Refill: 0 - cyclobenzaprine (FLEXERIL) 5 MG tablet; Take 1 tablet (5 mg total) by mouth 3 (three) times daily as needed for muscle spasms.  Dispense: 30 tablet; Refill: 1  2. Neck pain - DG Neck Soft Tissue; Future - predniSONE (DELTASONE) 10 MG tablet; Take 1 tablet (10 mg total) by mouth 2 (two) times daily with a meal.  Dispense: 20 tablet; Refill: 0 - ibuprofen (ADVIL) 800 MG tablet; Take 1 tablet (800 mg total) by mouth every 8 (eight) hours as needed for moderate pain.  Dispense: 30 tablet; Refill: 0 - cyclobenzaprine (FLEXERIL) 5 MG tablet; Take 1 tablet (5 mg total) by mouth 3 (three) times daily as needed for muscle spasms.  Dispense: 30 tablet; Refill: 1     Take Ibuprofen as directed for neck pain Alternate ice and heat to neck as needed for pain Take Flexeril 5 mg up to three times as needed for neck pain Take Prednisone 10 mg twice daily for 10 days Increase Lantus insulin to 75 units daily [facility] will call you with neck x-ray results Follow-up as needed   Follow-up: PRN  An After Visit  Summary was printed and given to the patient.  I, Rip Harbour, NP, have reviewed all documentation for this visit. The documentation on 07/12/21 for the exam, diagnosis, procedures, and orders are all accurate and complete.    Signed, Rip Harbour, NP [place] [phone]

## 2021-07-12 NOTE — Patient Instructions (Addendum)
Take Ibuprofen as directed for neck pain Alternate ice and heat to neck as needed for pain Take Flexeril 5 mg up to three times as needed for neck pain Take Prednisone 10 mg twice daily for 10 days Increase Lantus insulin to 75 units daily Obtain neck x-ray at [facility] for neck pain We will call you with neck x-ray results Follow-up as needed   Cervical Strain and Sprain Rehab Ask your health care provider which exercises are safe for you. Do exercises exactly as told by your health care provider and adjust them as directed. It is normal to feel mild stretching, pulling, tightness, or discomfort as you do these exercises. Stop right away if you feel sudden pain or your pain gets worse. Do not begin these exercises until told by your health care provider. Stretching and range-of-motion exercises Cervical side bending  Using good posture, sit on a stable chair or stand up. Without moving your shoulders, slowly tilt your left / right ear to your shoulder until you feel a stretch in the opposite side neck muscles. You should be looking straight ahead. Hold for __________ seconds. Repeat with the other side of your neck. Repeat __________ times. Complete this exercise __________ times a day. Cervical rotation  Using good posture, sit on a stable chair or stand up. Slowly turn your head to the side as if you are looking over your left / right shoulder. Keep your eyes level with the ground. Stop when you feel a stretch along the side and the back of your neck. Hold for __________ seconds. Repeat this by turning to your other side. Repeat __________ times. Complete this exercise __________ times a day. Thoracic extension and pectoral stretch Roll a towel or a small blanket so it is about 4 inches (10 cm) in diameter. Lie down on your back on a firm surface. Put the towel lengthwise, under your spine in the middle of your back. It should not be under your shoulder blades. The  towel should line up with your spine from your middle back to your lower back. Put your hands behind your head and let your elbows fall out to your sides. Hold for __________ seconds. Repeat __________ times. Complete this exercise __________ times a day. Strengthening exercises Isometric upper cervical flexion Lie on your back with a thin pillow behind your head and a small rolled-up towel under your neck. Gently tuck your chin toward your chest and nod your head down to look toward your feet. Do not lift your head off the pillow. Hold for __________ seconds. Release the tension slowly. Relax your neck muscles completely before you repeat this exercise. Repeat __________ times. Complete this exercise __________ times a day. Isometric cervical extension  Stand about 6 inches (15 cm) away from a wall, with your back facing the wall. Place a soft object, about 6-8 inches (15-20 cm) in diameter, between the back of your head and the wall. A soft object could be a small pillow, a ball, or a folded towel. Gently tilt your head back and press into the soft object. Keep your jaw and forehead relaxed. Hold for __________ seconds. Release the tension slowly. Relax your neck muscles completely before you repeat this exercise. Repeat __________ times. Complete this exercise __________ times a day. Posture and body mechanics Body mechanics refers to the movements and positions of your body while you do your daily activities. Posture is part of body mechanics. Good posture and healthy body mechanics can help to  relieve stress in your body's tissues and joints. Good posture means that your spine is in its natural S-curve position (your spine is neutral), your shoulders are pulled back slightly, and your head is not tipped forward. The following are general guidelines for applying improved posture and body mechanics to your everyday activities. Sitting  When sitting, keep your spine neutral and keep your  feet flat on the floor. Use a footrest, if necessary, and keep your thighs parallel to the floor. Avoid rounding your shoulders, and avoid tilting your head forward. When working at a desk or a computer, keep your desk at a height where your hands are slightly lower than your elbows. Slide your chair under your desk so you are close enough to maintain good posture. When working at a computer, place your monitor at a height where you are looking straight ahead and you do not have to tilt your head forward or downward to look at the screen. Standing  When standing, keep your spine neutral and keep your feet about hip-width apart. Keep a slight bend in your knees. Your ears, shoulders, and hips should line up. When you do a task in which you stand in one place for a long time, place one foot up on a stable object that is 2-4 inches (5-10 cm) high, such as a footstool. This helps keep your spine neutral. Resting When lying down and resting, avoid positions that are most painful for you. Try to support your neck in a neutral position. You can use a contour pillow or a small rolled-up towel. Your pillow should support your neck but not push on it. This information is not intended to replace advice given to you by your health care provider. Make sure you discuss any questions you have with your health care provider. Document Revised: 11/19/2018 Document Reviewed: 04/30/2018 Elsevier Patient Education  [place].

## 2021-07-13 DIAGNOSIS — C61 Malignant neoplasm of prostate: Secondary | ICD-10-CM

## 2021-07-13 HISTORY — DX: Malignant neoplasm of prostate: C61

## 2021-07-18 ENCOUNTER — Other Ambulatory Visit: Payer: Self-pay

## 2021-07-18 ENCOUNTER — Ambulatory Visit (INDEPENDENT_AMBULATORY_CARE_PROVIDER_SITE_OTHER): Payer: Medicare HMO | Admitting: Legal Medicine

## 2021-07-18 ENCOUNTER — Encounter: Payer: Self-pay | Admitting: Legal Medicine

## 2021-07-18 ENCOUNTER — Other Ambulatory Visit: Payer: Self-pay | Admitting: Family Medicine

## 2021-07-18 VITALS — BP 136/70 | HR 97 | Temp 97.8°F | Resp 16 | Ht 69.0 in | Wt 216.0 lb

## 2021-07-18 DIAGNOSIS — G4733 Obstructive sleep apnea (adult) (pediatric): Secondary | ICD-10-CM

## 2021-07-18 DIAGNOSIS — Z01818 Encounter for other preprocedural examination: Secondary | ICD-10-CM

## 2021-07-18 DIAGNOSIS — E782 Mixed hyperlipidemia: Secondary | ICD-10-CM

## 2021-07-18 DIAGNOSIS — N401 Enlarged prostate with lower urinary tract symptoms: Secondary | ICD-10-CM

## 2021-07-18 DIAGNOSIS — R3911 Hesitancy of micturition: Secondary | ICD-10-CM | POA: Diagnosis not present

## 2021-07-18 DIAGNOSIS — I119 Hypertensive heart disease without heart failure: Secondary | ICD-10-CM | POA: Diagnosis not present

## 2021-07-18 DIAGNOSIS — E1142 Type 2 diabetes mellitus with diabetic polyneuropathy: Secondary | ICD-10-CM

## 2021-07-18 DIAGNOSIS — I2510 Atherosclerotic heart disease of native coronary artery without angina pectoris: Secondary | ICD-10-CM

## 2021-07-18 NOTE — Progress Notes (Signed)
Subjective:  Patient ID: Joshua Nelson, male    DOB: [birth year]  Age: 70 y.o. MRN: [id number]  Chief Complaint  Patient presents with   Pre-op Exam    HPI  Patient is here for surgical clerance and urologist will do  TURP and urethral dilation. Plan for TURP.  He is having hesitancy, incontinence.  MI 2011, 3 stents, no further angina.  Not seen cardiologist in years.  No DOE., stroke 2 years ago, no residual. NO CHF, renal status normal, no hemorrhagic diathesis.  Incontinent of urine, clan TURP.  Diabetes under control last A1c 8.1 he is now on trulicity.  Hypertension and cholesterol under control.   Current Outpatient Medications on File Prior to Visit  Medication Sig Dispense Refill   clotrimazole-betamethasone (LOTRISONE) cream Apply topically.     Continuous Blood Gluc Receiver (FREESTYLE LIBRE 2 READER) DEVI Check qac and qhs 1 each 0   Continuous Blood Gluc Sensor (FREESTYLE LIBRE 2 SENSOR) MISC 1 each by Does not apply route every 14 (fourteen) days. 6 each 3   cyclobenzaprine (FLEXERIL) 5 MG tablet Take 1 tablet (5 mg total) by mouth 3 (three) times daily as needed for muscle spasms. 30 tablet 1   dapagliflozin propanediol (FARXIGA) 5 MG TABS tablet Take 1 tablet (5 mg total) by mouth daily. 90 tablet 0   Dulaglutide (TRULICITY) 3 BL/3.9QZ SOPN Inject 3 mg as directed once a week. 6 mL 0   ezetimibe (ZETIA) 10 MG tablet TAKE ONE (1) TABLET ONCE DAILY 90 tablet 1   gabapentin (NEURONTIN) 300 MG capsule TAKE ONE (1) CAPSULE THREE (3) TIMES EACH DAY 270 capsule 2   ibuprofen (ADVIL) 800 MG tablet Take 1 tablet (800 mg total) by mouth every 8 (eight) hours as needed for moderate pain. 30 tablet 0   insulin aspart (FIASP FLEXTOUCH) 100 UNIT/ML FlexTouch Pen Dose AT THE TIME OF MEAL as follows: 100-150 = 4 U, 151-200 = 8 U, 201-250 = 16 U, 251-300 = 20 U, Greater than 300 = 24 U. 15 mL 2   LANTUS SOLOSTAR 100 UNIT/ML Solostar Pen Inject 65 Units into the skin daily. 60 mL 0    lisinopril (ZESTRIL) 2.5 MG tablet Once daily 90 tablet 0   Meclizine HCl 25 MG CHEW TAKE 1 TABLET THREE TIMES DAILY AS NEEDED FOR DIZZINESS 30 tablet 0   meloxicam (MOBIC) 15 MG tablet Take 1 tablet (15 mg total) by mouth daily. 30 tablet 0   metoprolol succinate (TOPROL-XL) 25 MG 24 hr tablet TAKE ONE (1) TABLET ONCE DAILY 90 tablet 1   ofloxacin (OCUFLOX) 0.3 % ophthalmic solution      omega-3 acid ethyl esters (LOVAZA) 1 g capsule Take 2 capsules (2 g total) by mouth 2 (two) times daily. 360 capsule 0   predniSONE (DELTASONE) 10 MG tablet Take 1 tablet (10 mg total) by mouth 2 (two) times daily with a meal. 20 tablet 0   sildenafil (VIAGRA) 100 MG tablet SMARTSIG:0.5-1 Tablet(s) By Mouth     solifenacin (VESICARE) 10 MG tablet Take 10 mg by mouth daily.     tamsulosin (FLOMAX) 0.4 MG CAPS capsule Take 0.8 mg by mouth at bedtime.     zolpidem (AMBIEN) 10 MG tablet Take 10 mg by mouth at bedtime.      Colchicine (MITIGARE) 0.6 MG CAPS Take 1 capsule by mouth 2 (two) times daily for 5 days. 30 capsule 2   No current facility-administered medications on file prior to visit.  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   Past Surgical History:  Procedure Laterality Date   heart stents     x 3   left cataract  10/2019   right cataract Right 08/2019    Family History  Problem Relation Age of Onset   Endometrial cancer Mother    CAD Mother    AAA (abdominal aortic aneurysm) Mother    Healthy Father    Heart attack Brother    Social History   Socioeconomic History   Marital status: Married    Spouse name: Not on file   Number of children: 1   Years of education: 12   Highest education level: High  school graduate  Occupational History   Occupation: Retired  Tobacco Use   Smoking status: Former    Types: Cigarettes    Quit date: 2003    Years since quitting: 19.9   Smokeless tobacco: Never  Vaping Use   Vaping Use: Never used  Substance and Sexual Activity   Alcohol use: No   Drug use: Never   Sexual activity: Not on file  Other Topics Concern   Not on file  Social History Narrative   Lives at home with his wife.   2 cups caffeine per day.   Right-handed.   Social Determinants of Health   Financial Resource Strain: Not on file  Food Insecurity: Not on file  Transportation Needs: Not on file  Physical Activity: Not on file  Stress: Not on file  Social Connections: Not on file    Review of Systems  Constitutional:  Negative for chills, fatigue, fever and unexpected weight change.  HENT:  Negative for congestion, ear pain, sinus pain and sore throat.   Respiratory:  Negative for cough and shortness of breath.   Cardiovascular:  Negative for chest pain and palpitations.  Gastrointestinal:  Negative for abdominal pain, blood in stool, constipation, diarrhea, nausea and vomiting.  Endocrine: Negative for polydipsia.  Genitourinary:  Negative for dysuria.  Musculoskeletal:  Negative for back pain.  Skin:  Negative for rash.  Neurological:  Positive for headaches.    Objective:  BP 136/70   Pulse 97   Temp 97.8 F (36.6 C)   Resp 16   Ht 5\' 9"  (1.753 m)   Wt 216 lb (98 kg)   SpO2 98%   BMI 31.90 kg/m   BP/Weight 07/18/2021 07/12/2021 51/0/2585  Systolic BP 277 824 235  Diastolic BP 70 78 64  Wt. (Lbs) 216 216 214  BMI 31.9 31.9 33.52    Physical Exam Vitals reviewed.  Constitutional:      Appearance: Normal appearance. He is obese.  HENT:     Right Ear: Tympanic membrane, ear canal and external ear normal.     Left Ear: Tympanic membrane, ear canal and external ear normal.     Mouth/Throat:     Mouth: Mucous membranes are moist.     Pharynx:  Oropharynx is clear.  Eyes:     Extraocular Movements: Extraocular movements intact.     Conjunctiva/sclera: Conjunctivae normal.     Pupils: Pupils are equal, round, and reactive to light.  Cardiovascular:     Rate and Rhythm:  Normal rate and regular rhythm.     Pulses: Normal pulses.     Heart sounds: Normal heart sounds. No murmur heard.   No gallop.  Pulmonary:     Effort: Pulmonary effort is normal. No respiratory distress.     Breath sounds: No wheezing.  Abdominal:     General: Abdomen is flat. Bowel sounds are normal. There is no distension.     Palpations: Abdomen is soft.     Tenderness: There is no abdominal tenderness.  Musculoskeletal:     Right lower leg: No edema.     Left lower leg: No edema.  Skin:    General: Skin is warm.     Capillary Refill: Capillary refill takes less than 2 seconds.  Neurological:     General: No focal deficit present.     Mental Status: He is alert and oriented to person, place, and time. Mental status is at baseline.     Gait: Gait normal.  Psychiatric:        Mood and Affect: Mood normal.    Diabetic Foot Exam - Simple   Simple Foot Form Diabetic Foot exam was performed with the following findings: Yes 07/18/2021  3:19 PM  Visual Inspection No deformities, no ulcerations, no other skin breakdown bilaterally: Yes Sensation Testing See comments: Yes Pulse Check Posterior Tibialis and Dorsalis pulse intact bilaterally: Yes Comments Numbness feet      Lab Results  Component Value Date   WBC 8.8 04/25/2021   HGB 12.9 (L) 04/25/2021   HCT 38.9 04/25/2021   PLT 193 04/25/2021   GLUCOSE 163 (H) 04/25/2021   CHOL 140 04/25/2021   TRIG 113 04/25/2021   HDL 26 (L) 04/25/2021   LDLCALC 93 04/25/2021   ALT 16 04/25/2021   AST 17 04/25/2021   NA 139 04/25/2021   K 4.8 04/25/2021   CL 102 04/25/2021   CREATININE 0.83 04/25/2021   BUN 12 04/25/2021   CO2 25 04/25/2021   TSH 0.687 06/28/2020   HGBA1C 8.1 (H) 04/25/2021    MICROALBUR 10 10/17/2020      Assessment & Plan:   Problem List Items Addressed This Visit       Cardiovascular and Mediastinum  Preoperative general physical examination Patient is doing well.  Needs cardiology checkup      Hypertensive heart disease without heart failure An individual hypertension care plan was established and reinforced today.  The patient's status was assessed using clinical findings on exam and labs or diagnostic tests. The patient's success at meeting treatment goals on disease specific evidence-based guidelines and found to be well controlled. SELF MANAGEMENT: The patient and I together assessed ways to personally work towards obtaining the recommended goals. RECOMMENDATIONS: avoid decongestants found in common cold remedies, decrease consumption of alcohol, perform routine monitoring of BP with home BP cuff, exercise, reduction of dietary salt, take medicines as prescribed, try not to miss doses and quit smoking.  Regular exercise and maintaining a healthy weight is needed.  Stress reduction may help. A CLINICAL SUMMARY including written plan identify barriers to care unique to individual due to social or financial issues.  We attempt to mutually creat solutions for individual and family  understanding.    Atherosclerosis of native coronary artery of native heart without angina pectoris   No chest pain since stents     Respiratory   Obstructive sleep apnea Using CPAP consistently every night and medically benefiting from its use.      Endocrine  Diabetic polyneuropathy [facility]) An individual care plan for diabetes was established and reinforced today.  The patient's status was assessed using clinical findings on exam, labs and diagnostic testing. Patient success at meeting goals based on disease specific evidence-based guidelines and found to be good controlled. Medications were assessed and patient's understanding of the medical issues , including barriers  were assessed. Recommend adherence to a diabetic diet, a graduated exercise program, HgbA1c level is checked quarterly, and urine microalbumin performed yearly .  Annual mono-filament sensation testing performed. Lower blood pressure and control hyperlipidemia is important. Get annual eye exams and annual flu shots and smoking cessation discussed.  Self management goals were discussed.      Other   Mixed hyperlipidemia AN INDIVIDUAL CARE PLAN for hyperlipidemia/ cholesterol was established and reinforced today.  The patient's status was assessed using clinical findings on exam, lab and other diagnostic tests. The patient's disease status was assessed based on evidence-based guidelines and found to be well controlled. MEDICATIONS were reviewed. SELF MANAGEMENT GOALS have been discussed and patient's success at attaining the goal of low cholesterol was assessed. RECOMMENDATION given include regular exercise 3 days a week and low cholesterol/low fat diet. CLINICAL SUMMARY including written plan to identify barriers unique to the patient due to social or economic  reasons was discussed.       Other Visit Diagnoses        Benign prostatic hyperplasia with urinary hesitancy    Need TURP      .         Follow-up: Return if symptoms worsen or fail to improve.  An After Visit Summary was printed and given to the patient.  Reinaldo Meeker, MD [facility] [phone]

## 2021-07-21 ENCOUNTER — Other Ambulatory Visit: Payer: Self-pay | Admitting: Urology

## 2021-07-24 NOTE — Progress Notes (Signed)
Joshua Nelson  07/24/2021   Your procedure is scheduled on:     08/01/2021   Report to [facility] Main  Entrance   Report to admitting at   1145AM     Call this number if you have problems the morning of surgery [phone]    Remember: Do not eat food , candy gum or mints :After Midnight. You may have clear liquids from midnight until __  1100 am   CLEAR LIQUID DIET   Foods Allowed                                                                       Coffee and tea, regular and decaf                              Plain Jell-O any favor except red or purple                                            Fruit ices (not with fruit pulp)                                      Iced Popsicles                                     Carbonated beverages, regular and diet                                    Cranberry, grape and apple juices Sports drinks like Gatorade Lightly seasoned clear broth or consume(fat free) Sugar   _____________________________________________________________________    BRUSH YOUR TEETH MORNING OF SURGERY AND RINSE YOUR MOUTH OUT, NO CHEWING GUM CANDY OR MINTS.     Take these medicines the morning of surgery with A SIP OF WATER:  gabapenti, vesicare       DO NOT TAKE ANY DIABETIC MEDICATIONS DAY OF YOUR SURGERY                               You may not have any metal on your body including hair pins and              piercings  Do not wear jewelry, make-up, lotions, powders or perfumes, deodorant             Do not wear nail polish on your fingernails.  Do not shave  48 hours prior to surgery.              Men may shave face and neck.   Do not bring valuables to the hospital. [place]  VALUABLES.  Contacts, dentures or bridgework may not be worn into surgery.  Leave suitcase in the car. After surgery it may be brought to your room.     Patients discharged the day of surgery will not be  allowed to drive home. IF YOU ARE HAVING SURGERY AND GOING HOME THE SAME DAY, YOU MUST HAVE AN ADULT TO DRIVE YOU HOME AND BE WITH YOU FOR 24 HOURS. YOU MAY GO HOME BY TAXI OR UBER OR ORTHERWISE, BUT AN ADULT MUST ACCOMPANY YOU HOME AND STAY WITH YOU FOR 24 HOURS.  Name and phone number of your driver:  Special Instructions: N/A              Please read over the following fact sheets you were given: _____________________________________________________________________  [facility] - Preparing for Surgery Before surgery, you can play an important role.  Because skin is not sterile, your skin needs to be as free of germs as possible.  You can reduce the number of germs on your skin by washing with CHG (chlorahexidine gluconate) soap before surgery.  CHG is an antiseptic cleaner which kills germs and bonds with the skin to continue killing germs even after washing. Please DO NOT use if you have an allergy to CHG or antibacterial soaps.  If your skin becomes reddened/irritated stop using the CHG and inform your nurse when you arrive at Short Stay. Do not shave (including legs and underarms) for at least 48 hours prior to the first CHG shower.  You may shave your face/neck. Please follow these instructions carefully:  1.  Shower with CHG Soap the night before surgery and the  morning of Surgery.  2.  If you choose to wash your hair, wash your hair first as usual with your  normal  shampoo.  3.  After you shampoo, rinse your hair and body thoroughly to remove the  shampoo.                           4.  Use CHG as you would any other liquid soap.  You can apply chg directly  to the skin and wash                       Gently with a scrungie or clean washcloth.  5.  Apply the CHG Soap to your body ONLY FROM THE NECK DOWN.   Do not use on face/ open                           Wound or open sores. Avoid contact with eyes, ears mouth and genitals (private parts).                       Wash face,  Genitals  (private parts) with your normal soap.             6.  Wash thoroughly, paying special attention to the area where your surgery  will be performed.  7.  Thoroughly rinse your body with warm water from the neck down.  8.  DO NOT shower/wash with your normal soap after using and rinsing off  the CHG Soap.                9.  Pat yourself dry with a clean towel.            10.  Wear clean pajamas.  11.  Place clean sheets on your bed the night of your first shower and do not  sleep with pets. Day of Surgery : Do not apply any lotions/deodorants the morning of surgery.  Please wear clean clothes to the hospital/surgery center.  FAILURE TO FOLLOW THESE INSTRUCTIONS MAY RESULT IN THE CANCELLATION OF YOUR SURGERY PATIENT SIGNATURE_________________________________  NURSE SIGNATURE__________________________________  ________________________________________________________________________

## 2021-07-24 NOTE — Progress Notes (Addendum)
Anesthesia Review:  PCP: [facility]- LOV 07/18/21  DR Reinaldo Meeker for preop eval .- In note states needs cardiology checkup.  Kirstein Cox  Cardiologist : DR Marijo File in [place]  Not seen in at least 2 years per pt LOV 2019 in Care Everywhere  Stents 2011  Chest x-ray : EKG :07/26/21  Echo : Stress test: Cardiac Cath :  Activity level: can do a flight of stairs without difficulty  Sleep Study/ CPAP : Fasting Blood Sugar :      / Checks Blood Sugar -- times a day:   Blood Thinner/ Instructions /Last Dose: ASA / Instructions/ Last Dose :   DM- type 2 Hgba1c- 07/26/21 - 8.7 - routed to DR pace on 07/26/21.   Has Freestyle Libre  Hx of stroke - residual of left side of face has a burning sensation at times per pt  Covid test- 07/28/21  At [facility] of preop appt on 07/25/21 pt was unsure of what time of day he takes some meds.  He stated that his wife was aware when he took meds.  Informed pt that I would call wife at home after preop and verify what time of day he takes meds.  Called wife at home and she stated that he takes Lantus at nite because she gives him his Insulin shots.  She verified the rest of the diabetic meds and what times he takes them. Wife that that he keeps up with the rest of his pills and that I would need to talk to him.  PT is currently taking a nap and she will have him call me on Friday 07/28/21 to clarify meds.  Number given of [phone].   PT was to reeturn a call to PST nurse on 07/28/21.  Called pt at home and he was gone to get his coivd test done preop per wife.  Wife stated she asked him about his lisinopril , metoprolol nad vesicare and wife stated that pt takes those meds daily in am . Corrected on medication record.   Reviewed with wife via phone and wife stated she wrote down that Patient is to [place] day before surgery and no diabetic  meds day of surgery.  PT is to take 1/2 dose of Lantus  Insulin nite before surgery, pt is to take  gabapentin and vesicare day of surgery along with metoprolol.  Wife voiced understanding.   Darrel Reach from [facility] called and stated Janett Billow had called and stated pt needed clearance for surgery.  Pt saw cardiology on 07/31/21.  Pam stated she could not see clearanced in epic.  In note from 07/31/21 pt reported pt had not stopped Plavix.  Read note to [facility] and stated that he was clear.   Pt saw cardiology on 07/31/2021 for clearance for surgery.  Printed to place on chart.  Per cardiology ov note

## 2021-07-24 NOTE — Progress Notes (Incomplete Revision)
Anesthesia Review:  PCP: [facility]- LOV 07/18/21  DR Reinaldo Meeker for preop eval .- In note states needs cardiology checkup.  Kirstein Cox  Cardiologist : DR Marijo File in [place]  Not seen in at least 2 years per pt LOV 2019 in Care Everywhere  Stents 2011  Chest x-ray : EKG :07/26/21  Echo : Stress test: Cardiac Cath :  Activity level: can do a flight of stairs without difficulty  Sleep Study/ CPAP : Fasting Blood Sugar :      / Checks Blood Sugar -- times a day:   Blood Thinner/ Instructions /Last Dose: ASA / Instructions/ Last Dose :   DM- type 2 Hgba1c- 07/26/21 - 8.7 - routed to DR pace on 07/26/21.   Has Freestyle Libre  Hx of stroke - residual of left side of face has a burning sensation at times per pt  Covid test- 07/28/21  At [facility] of preop appt on 07/25/21 pt was unsure of what time of day he takes some meds.  He stated that his wife was aware when he took meds.  Informed pt that I would call wife at home after preop and verify what time of day he takes meds.  Called wife at home and she stated that he takes Lantus at nite because she gives him his Insulin shots.  She verified the rest of the diabetic meds and what times he takes them. Wife that that he keeps up with the rest of his pills and that I would need to talk to him.  PT is currently taking a nap and she will have him call me on Friday 07/28/21 to clarify meds.  Number given of [phone].   PT was to reeturn a call to PST nurse on 07/28/21.  Called pt at home and he was gone to get his coivd test done preop per wife.  Wife stated she asked him about his lisinopril , metoprolol nad vesicare and wife stated that pt takes those meds daily in am . Corrected on medication record.   Reviewed with wife via phone and wife stated she wrote down that Patient is to [place] day before surgery and no diabetic  meds day of surgery.  PT is to take 1/2 dose of Lantus  Insulin nite before surgery, pt is to take  gabapentin and vesicare day of surgery along with metoprolol.  Wife voiced understanding.   Darrel Reach from [facility] called and stated Janett Billow had called and stated pt needed clearance for surgery.  Pt saw cardiology on 07/31/21.  Pam stated she could not see clearanced in epic.  In note from 07/31/21 pt reported pt had not stopped Plavix.  Read note to [facility] and stated that he was clear.   Pt saw cardiology on 07/31/2021 for clearance for surgery.  Printed to place on chart.  Per cardiology ov note pt is cleared for surgery.   Pam called back and wife stated per Pam pt is not on Plavix and pt states he is not on Plavix.  On med list in epic pt is not on plavix.

## 2021-07-26 ENCOUNTER — Other Ambulatory Visit: Payer: Self-pay

## 2021-07-26 ENCOUNTER — Encounter (HOSPITAL_COMMUNITY)
Admission: RE | Admit: 2021-07-26 | Discharge: 2021-07-26 | Disposition: A | Discharge status: Home / Self Care | Payer: Medicare HMO | Source: Ambulatory Visit | Attending: Urology | Admitting: Urology

## 2021-07-26 ENCOUNTER — Encounter (HOSPITAL_COMMUNITY): Payer: Self-pay

## 2021-07-26 VITALS — BP 151/89 | HR 75 | Temp 98.2°F | Resp 16 | Ht 69.0 in | Wt 184.0 lb

## 2021-07-26 DIAGNOSIS — Z01818 Encounter for other preprocedural examination: Secondary | ICD-10-CM | POA: Insufficient documentation

## 2021-07-26 DIAGNOSIS — N400 Enlarged prostate without lower urinary tract symptoms: Secondary | ICD-10-CM | POA: Insufficient documentation

## 2021-07-26 DIAGNOSIS — Z955 Presence of coronary angioplasty implant and graft: Secondary | ICD-10-CM | POA: Insufficient documentation

## 2021-07-26 DIAGNOSIS — I2510 Atherosclerotic heart disease of native coronary artery without angina pectoris: Secondary | ICD-10-CM | POA: Diagnosis not present

## 2021-07-26 DIAGNOSIS — I252 Old myocardial infarction: Secondary | ICD-10-CM | POA: Insufficient documentation

## 2021-07-26 DIAGNOSIS — Z8673 Personal history of transient ischemic attack (TIA), and cerebral infarction without residual deficits: Secondary | ICD-10-CM | POA: Diagnosis not present

## 2021-07-26 DIAGNOSIS — E139 Other specified diabetes mellitus without complications: Secondary | ICD-10-CM

## 2021-07-26 DIAGNOSIS — I119 Hypertensive heart disease without heart failure: Secondary | ICD-10-CM | POA: Diagnosis not present

## 2021-07-26 DIAGNOSIS — Z87891 Personal history of nicotine dependence: Secondary | ICD-10-CM | POA: Diagnosis not present

## 2021-07-26 DIAGNOSIS — N35919 Unspecified urethral stricture, male, unspecified site: Secondary | ICD-10-CM | POA: Diagnosis not present

## 2021-07-26 HISTORY — DX: Nausea with vomiting, unspecified: R11.2

## 2021-07-26 HISTORY — DX: Unspecified osteoarthritis, unspecified site: M19.90

## 2021-07-26 HISTORY — DX: Other specified postprocedural states: Z98.890

## 2021-07-26 LAB — GLUCOSE, CAPILLARY: Glucose-Capillary: 169 mg/dL — ABNORMAL HIGH (ref 70–99)

## 2021-07-26 LAB — CBC
HCT: 41.5 % (ref 39.0–52.0)
Hemoglobin: 13.6 g/dL (ref 13.0–17.0)
MCH: 28.9 pg (ref 26.0–34.0)
MCHC: 32.8 g/dL (ref 30.0–36.0)
MCV: 88.1 fL (ref 80.0–100.0)
Platelets: 185 10*3/uL (ref 150–400)
RBC: 4.71 MIL/uL (ref 4.22–5.81)
RDW: 13.2 % (ref 11.5–15.5)
WBC: 11 10*3/uL — ABNORMAL HIGH (ref 4.0–10.5)
nRBC: 0 % (ref 0.0–0.2)

## 2021-07-26 LAB — BASIC METABOLIC PANEL
Anion gap: 9 (ref 5–15)
BUN: 16 mg/dL (ref 8–23)
CO2: 24 mmol/L (ref 22–32)
Calcium: 9.1 mg/dL (ref 8.9–10.3)
Chloride: 103 mmol/L (ref 98–111)
Creatinine, Ser: 0.87 mg/dL (ref 0.61–1.24)
GFR, Estimated: >60 mL/min (ref >60)
Glucose, Bld: 168 mg/dL — ABNORMAL HIGH (ref 70–99)
Potassium: 4.3 mmol/L (ref 3.5–5.1)
Sodium: 136 mmol/L (ref 135–145)

## 2021-07-26 LAB — HEMOGLOBIN A1C
Hgb A1c MFr Bld: 8.7 % — ABNORMAL HIGH (ref 4.8–5.6)
Mean Plasma Glucose: 202.99 mg/dL

## 2021-07-27 ENCOUNTER — Telehealth: Payer: Self-pay

## 2021-07-27 NOTE — Progress Notes (Signed)
Cancelled.  

## 2021-07-27 NOTE — Chronic Care Management (AMB) (Signed)
Chronic Care Management Pharmacy Assistant   Name: Joshua Nelson  MRN: [id number] DOB: 12-21-1950  Reason for Encounter: Disease State/ Diabetes  Recent office visits:  07-18-2021 Lillard Anes, MD. Preop exam.  07-12-2021 Rip Harbour, NP. STOP keflex. START prednisone 10 mg twice daily. Flexeril 5 mg 3 times daily, Ibuprofen 800 mg every 8 hours as needed, Prednisone 10 mg twice daily.  06-14-2021 Lillard Anes, MD. Follow up for gout involving toe on left foot. Recent consult visits:  07-27-2021 Robley Fries, MD. Preop testing appointment.  06-19-2021 Evelina Bucy, DPM (Podiatry). "Casted for DM shoes today and nail Debridement.  Hospital visits:  None in previous 6 months  Medications: Outpatient Encounter Medications as of 07/27/2021  Medication Sig   atorvastatin (LIPITOR) 80 MG tablet TAKE ONE (1) TABLET ONCE DAILY   Colchicine (MITIGARE) 0.6 MG CAPS Take 1 capsule by mouth 2 (two) times daily for 5 days. (Patient not taking: Reported on 07/21/2021)   Continuous Blood Gluc Receiver (FREESTYLE LIBRE 2 READER) DEVI Check qac and qhs   Continuous Blood Gluc Sensor (FREESTYLE LIBRE 2 SENSOR) MISC 1 each by Does not apply route every 14 (fourteen) days.   cyclobenzaprine (FLEXERIL) 5 MG tablet Take 1 tablet (5 mg total) by mouth 3 (three) times daily as needed for muscle spasms.   dapagliflozin propanediol (FARXIGA) 5 MG TABS tablet Take 1 tablet (5 mg total) by mouth daily. (Patient taking differently: Take 5 mg by mouth daily. Pt takes in the am)   Dulaglutide (TRULICITY) 3 CH/8.8FO SOPN Inject 3 mg as directed once a week. (Patient taking differently: Inject 3 mg as directed once a week. Pt takes on Tuesdays)   ezetimibe (ZETIA) 10 MG tablet TAKE ONE (1) TABLET ONCE DAILY   gabapentin (NEURONTIN) 300 MG capsule TAKE ONE (1) CAPSULE THREE (3) TIMES EACH DAY   ibuprofen (ADVIL) 800 MG tablet Take 1 tablet (800 mg total) by mouth every 8 (eight)  hours as needed for moderate pain.   insulin aspart (FIASP FLEXTOUCH) 100 UNIT/ML FlexTouch Pen Dose AT THE TIME OF MEAL as follows: 100-150 = 4 U, 151-200 = 8 U, 201-250 = 16 U, 251-300 = 20 U, Greater than 300 = 24 U. (Patient taking differently: Inject 10 Units into the skin in the morning and at bedtime. Pt covers with Sliding Scale in am at breakfast and in the pm at supper)   LANTUS SOLOSTAR 100 UNIT/ML Solostar Pen Inject 65 Units into the skin daily. (Patient taking differently: Inject 65 Units into the skin daily. Pt takes in the pm per wife)   lisinopril (ZESTRIL) 2.5 MG tablet Once daily   Meclizine HCl 25 MG CHEW TAKE 1 TABLET THREE TIMES DAILY AS NEEDED FOR DIZZINESS   meloxicam (MOBIC) 15 MG tablet Take 1 tablet (15 mg total) by mouth daily.   metoprolol succinate (TOPROL-XL) 25 MG 24 hr tablet TAKE ONE (1) TABLET ONCE DAILY   omega-3 acid ethyl esters (LOVAZA) 1 g capsule Take 2 capsules (2 g total) by mouth 2 (two) times daily.   predniSONE (DELTASONE) 10 MG tablet Take 1 tablet (10 mg total) by mouth 2 (two) times daily with a meal. (Patient not taking: Reported on 07/21/2021)   solifenacin (VESICARE) 10 MG tablet Take 10 mg by mouth daily.   tamsulosin (FLOMAX) 0.4 MG CAPS capsule Take 0.8 mg by mouth at bedtime.   zolpidem (AMBIEN) 10 MG tablet Take 10 mg by mouth at bedtime.  No facility-administered encounter medications on file as of 07/27/2021.  Recent Relevant Labs: Lab Results  Component Value Date/Time   HGBA1C 8.7 (H) 07/26/2021 11:30 AM   HGBA1C 8.1 (H) 04/25/2021 09:45 AM   MICROALBUR 10 10/17/2020 09:50 AM   MICROALBUR 10 11/30/2019 09:50 AM    Kidney Function Lab Results  Component Value Date/Time   CREATININE 0.87 07/26/2021 11:30 AM   CREATININE 0.83 04/25/2021 09:45 AM   GFRNONAA >60 07/26/2021 11:30 AM   GFRAA 105 06/28/2020 09:30 AM     Current antihyperglycemic regimen:  Farxiga 5 mg daily Trulicity 3 mg weekly Fiasp sliding scale Lantus 65  units daily   Patient verbally confirms he is taking the above medications as directed. Yes  What recent interventions/DTPs have been made to improve glycemic control:  None  Have there been any recent hospitalizations or ED visits since last visit with CPP? No  Patient denies hypoglycemic symptoms  Patient denies hyperglycemic symptoms  How often are you checking your blood sugar? 3-4 times daily  What are your blood sugars ranging? Patient's wife stated that patient wasn't around but sugars run 150-160 fasting  Fasting: 150-160 Before meals: None After meals: None Bedtime: None  On insulin? Yes How many units: 65  During the week, how often does your blood glucose drop below 70? Never  Are you checking your feet daily/regularly? Yes  Adherence Review: Is the patient currently on a STATIN medication? Yes Is the patient currently on ACE/ARB medication? Yes Does the patient have >5 day gap between last estimated fill dates? CPP to review  NOTES: Patient wasn't available so wife stated he will have to call back to schedule follow up visit with [place] CPP.  Care Gaps: Last eye exam / Retinopathy Screening? None Next Annual Wellness Visit? 08-03-2021 Last Diabetic Foot Exam? 06-19-2021 Tdap overdue Shingrix Colonoscopy overdue Yearly ophthalmology overdue  Star Rating Drugs: Lisinopril 2.5 mg- Last filled 05-16-2021 90 DS Farxiga 5 mg- Last filled [id number] DS Trulicity 3 mg- Last filled 07-17-2021 28 DS Atorvastatin 80 mg- Last filled 07-18-2021 90 DS  [place] Clinical Pharmacist Assistant [phone]

## 2021-07-27 NOTE — Progress Notes (Addendum)
Anesthesia Chart Review   Case: [id number] Date/Time: 08/01/21 1345   Procedures:      TRANSURETHRAL RESECTION OF BLADDER TUMOR (TURBT) - 12 MINS     CYSTOSCOPY WITH URETHRAL DILATATION   Anesthesia type: General   Pre-op diagnosis: BENIGN PROSTATIC HYPERPLASIA, URETHRAL STRICTURE   Location: [facility]   Surgeons: Robley Fries, MD       DISCUSSION:70 y.o. former smoker with h/o PONV, HTN, DM II, CVA, CAD (MI 2011, stent), BPH scheduled for above procedure 08/01/2021 with Dr. Jacalyn Lefevre.   Pt seen by PCP for preoperative evaluation 07/18/2021. Per OV note, "Patient is doing well.  Needs cardiology checkup"  Pt last seen by cardiology 03/28/2018.   Pt needs cardiology clearance. Dr. Keane Scrape office made aware.   Addendum 07/31/2021:  Pt seen by cardiology 07/31/21 for preoperative evaluation.  Per OV note, "EKG today shows normal sinus rhythm with heart rate of 77. The patient present here for preop evaluation for TURP which is scheduled tomorrow. The patient has NOT been holding Plavix. He states that he will hold it from today. The patient denies any anginal symptoms. He has some cardiac risk factors. I believe TURP is low risks procedure from cardiac standpoint. Since he is not having any symptoms and his stay active. I do not think he needs any further testing prior to procedure. However we recommend holding Plavix for 5 to 7 days prior to the procedure. He may need to reschedule the procedure."   VS: BP (!) 151/89    Pulse 75    Temp 36.8 C (Oral)    Resp 16    Ht 5\' 9"  (1.753 m)    Wt 83.5 kg    SpO2 98%    BMI 27.17 kg/m   PROVIDERS: [name], MD is PCP   Clarene Critchley, MD is Cardiologist  LABS: Labs reviewed: Acceptable for surgery. (all labs ordered are listed, but only abnormal results are displayed)  Labs Reviewed  BASIC METABOLIC PANEL - Abnormal; Notable for the following components:      Result Value   Glucose, Bld 168 (*)    All other  components within normal limits  CBC - Abnormal; Notable for the following components:   WBC 11.0 (*)    All other components within normal limits  GLUCOSE, CAPILLARY - Abnormal; Notable for the following components:   Glucose-Capillary 169 (*)    All other components within normal limits  HEMOGLOBIN A1C - Abnormal; Notable for the following components:   Hgb A1c MFr Bld 8.7 (*)    All other components within normal limits     IMAGES:   EKG: 07/26/2021 Rate 75 bpm  NSR   CV:  Past Medical History:  Diagnosis Date   Arthritis    Cerebrovascular accident (CVA) ([place]) 04/03/2018   Coronary artery disease    CVA (cerebral vascular accident) ([place])    Diabetes mellitus without complication ([place])    Hypertension    Insomnia    Lacunar infarction ([place]) 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   Myocardial infarct ([place])    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   PONV (postoperative nausea and vomiting)     Past Surgical History:  Procedure Laterality Date   heart stents     x 3   left cataract  10/2019   nose surgery for dog bite as a kid      right cataract Right 08/2019    MEDICATIONS:  atorvastatin (LIPITOR) 80 MG tablet   Colchicine (MITIGARE) 0.6 MG  CAPS   Continuous Blood Gluc Receiver (FREESTYLE LIBRE 2 READER) DEVI   Continuous Blood Gluc Sensor (FREESTYLE LIBRE 2 SENSOR) MISC   cyclobenzaprine (FLEXERIL) 5 MG tablet   dapagliflozin propanediol (FARXIGA) 5 MG TABS tablet   Dulaglutide (TRULICITY) 3 OV/7.0HE SOPN   ezetimibe (ZETIA) 10 MG tablet   gabapentin (NEURONTIN) 300 MG capsule   ibuprofen (ADVIL) 800 MG tablet   insulin aspart (FIASP FLEXTOUCH) 100 UNIT/ML FlexTouch Pen   LANTUS SOLOSTAR 100 UNIT/ML Solostar Pen   lisinopril (ZESTRIL) 2.5 MG tablet   Meclizine HCl 25 MG CHEW   meloxicam (MOBIC) 15 MG tablet   metoprolol succinate (TOPROL-XL) 25 MG 24 hr tablet   omega-3 acid ethyl esters (LOVAZA) 1 g capsule   predniSONE (DELTASONE) 10 MG tablet   solifenacin (VESICARE) 10 MG tablet    tamsulosin (FLOMAX) 0.4 MG CAPS capsule   zolpidem (AMBIEN) 10 MG tablet   No current facility-administered medications for this encounter.   Konrad Felix Ward, PA-C [facility] [phone]

## 2021-07-28 ENCOUNTER — Other Ambulatory Visit: Payer: Self-pay | Admitting: Plastic Surgery

## 2021-07-28 DIAGNOSIS — E1165 Type 2 diabetes mellitus with hyperglycemia: Secondary | ICD-10-CM | POA: Diagnosis not present

## 2021-07-28 DIAGNOSIS — E118 Type 2 diabetes mellitus with unspecified complications: Secondary | ICD-10-CM | POA: Diagnosis not present

## 2021-07-28 LAB — SARS CORONAVIRUS 2 (TAT 6-24 HRS): SARS Coronavirus 2: NEGATIVE

## 2021-07-28 NOTE — Telephone Encounter (Signed)
Sugars elevated. However patient is trying to schedule procedure for next few days and would like to hold off on sugars until next month. Scheduled F/U

## 2021-07-31 ENCOUNTER — Ambulatory Visit (INDEPENDENT_AMBULATORY_CARE_PROVIDER_SITE_OTHER): Payer: Medicare HMO | Admitting: Podiatry

## 2021-07-31 ENCOUNTER — Ambulatory Visit (INDEPENDENT_AMBULATORY_CARE_PROVIDER_SITE_OTHER): Payer: Medicare HMO | Admitting: Family Medicine

## 2021-07-31 DIAGNOSIS — B351 Tinea unguium: Secondary | ICD-10-CM

## 2021-07-31 DIAGNOSIS — G4733 Obstructive sleep apnea (adult) (pediatric): Secondary | ICD-10-CM

## 2021-07-31 DIAGNOSIS — Z955 Presence of coronary angioplasty implant and graft: Secondary | ICD-10-CM | POA: Diagnosis not present

## 2021-07-31 DIAGNOSIS — Z6830 Body mass index (BMI) 30.0-30.9, adult: Secondary | ICD-10-CM | POA: Diagnosis not present

## 2021-07-31 DIAGNOSIS — E1142 Type 2 diabetes mellitus with diabetic polyneuropathy: Secondary | ICD-10-CM

## 2021-07-31 DIAGNOSIS — M2041 Other hammer toe(s) (acquired), right foot: Secondary | ICD-10-CM

## 2021-07-31 DIAGNOSIS — E1140 Type 2 diabetes mellitus with diabetic neuropathy, unspecified: Secondary | ICD-10-CM | POA: Diagnosis not present

## 2021-07-31 DIAGNOSIS — I252 Old myocardial infarction: Secondary | ICD-10-CM | POA: Diagnosis not present

## 2021-07-31 DIAGNOSIS — E785 Hyperlipidemia, unspecified: Secondary | ICD-10-CM | POA: Diagnosis not present

## 2021-07-31 DIAGNOSIS — M2042 Other hammer toe(s) (acquired), left foot: Secondary | ICD-10-CM

## 2021-07-31 DIAGNOSIS — E669 Obesity, unspecified: Secondary | ICD-10-CM | POA: Diagnosis not present

## 2021-07-31 DIAGNOSIS — Z01810 Encounter for preprocedural cardiovascular examination: Secondary | ICD-10-CM | POA: Diagnosis not present

## 2021-07-31 DIAGNOSIS — I2510 Atherosclerotic heart disease of native coronary artery without angina pectoris: Secondary | ICD-10-CM | POA: Diagnosis not present

## 2021-07-31 DIAGNOSIS — E118 Type 2 diabetes mellitus with unspecified complications: Secondary | ICD-10-CM

## 2021-07-31 DIAGNOSIS — I10 Essential (primary) hypertension: Secondary | ICD-10-CM | POA: Diagnosis not present

## 2021-07-31 DIAGNOSIS — E1169 Type 2 diabetes mellitus with other specified complication: Secondary | ICD-10-CM

## 2021-07-31 DIAGNOSIS — I119 Hypertensive heart disease without heart failure: Secondary | ICD-10-CM | POA: Diagnosis not present

## 2021-07-31 DIAGNOSIS — E782 Mixed hyperlipidemia: Secondary | ICD-10-CM

## 2021-07-31 DIAGNOSIS — Z794 Long term (current) use of insulin: Secondary | ICD-10-CM

## 2021-07-31 DIAGNOSIS — Z87891 Personal history of nicotine dependence: Secondary | ICD-10-CM | POA: Diagnosis not present

## 2021-07-31 NOTE — Progress Notes (Signed)
The Patient present to the office today to pick up diabetic shoes and 3 pair of diabetic custom inserts  1 pair of inserts were put in the shoes and the shoes were fitted to the patient. The patient states they are comfortable and free of defect. He was satisfied with the fit of the shoe. Instructions for break in and wear were dispensed. The Patient signed the delivery documentation and break in instruction form  If any concerns or questions arise, patient is to call the office

## 2021-08-01 ENCOUNTER — Ambulatory Visit (HOSPITAL_COMMUNITY): Payer: Medicare HMO | Admitting: Anesthesiology

## 2021-08-01 ENCOUNTER — Encounter (HOSPITAL_COMMUNITY)
Admission: RE | Disposition: A | Discharge status: Home / Self Care | Payer: Self-pay | Source: Home / Self Care | Attending: Urology

## 2021-08-01 ENCOUNTER — Ambulatory Visit (HOSPITAL_COMMUNITY)
Admission: RE | Admit: 2021-08-01 | Discharge: 2021-08-02 | Disposition: A | Discharge status: Home / Self Care | Payer: Medicare HMO | Attending: Urology | Admitting: Urology

## 2021-08-01 ENCOUNTER — Other Ambulatory Visit: Payer: Self-pay

## 2021-08-01 ENCOUNTER — Encounter (HOSPITAL_COMMUNITY): Payer: Self-pay | Admitting: Urology

## 2021-08-01 ENCOUNTER — Ambulatory Visit (HOSPITAL_COMMUNITY): Payer: Medicare HMO | Admitting: Physician Assistant

## 2021-08-01 DIAGNOSIS — E119 Type 2 diabetes mellitus without complications: Secondary | ICD-10-CM | POA: Diagnosis not present

## 2021-08-01 DIAGNOSIS — N5201 Erectile dysfunction due to arterial insufficiency: Secondary | ICD-10-CM | POA: Diagnosis not present

## 2021-08-01 DIAGNOSIS — N320 Bladder-neck obstruction: Secondary | ICD-10-CM | POA: Insufficient documentation

## 2021-08-01 DIAGNOSIS — Z794 Long term (current) use of insulin: Secondary | ICD-10-CM | POA: Insufficient documentation

## 2021-08-01 DIAGNOSIS — I10 Essential (primary) hypertension: Secondary | ICD-10-CM | POA: Insufficient documentation

## 2021-08-01 DIAGNOSIS — N138 Other obstructive and reflux uropathy: Secondary | ICD-10-CM | POA: Diagnosis present

## 2021-08-01 DIAGNOSIS — C61 Malignant neoplasm of prostate: Secondary | ICD-10-CM | POA: Insufficient documentation

## 2021-08-01 DIAGNOSIS — M1990 Unspecified osteoarthritis, unspecified site: Secondary | ICD-10-CM | POA: Insufficient documentation

## 2021-08-01 DIAGNOSIS — G4730 Sleep apnea, unspecified: Secondary | ICD-10-CM | POA: Insufficient documentation

## 2021-08-01 DIAGNOSIS — I252 Old myocardial infarction: Secondary | ICD-10-CM | POA: Insufficient documentation

## 2021-08-01 DIAGNOSIS — Z7984 Long term (current) use of oral hypoglycemic drugs: Secondary | ICD-10-CM | POA: Insufficient documentation

## 2021-08-01 DIAGNOSIS — N35919 Unspecified urethral stricture, male, unspecified site: Secondary | ICD-10-CM | POA: Diagnosis not present

## 2021-08-01 DIAGNOSIS — N401 Enlarged prostate with lower urinary tract symptoms: Secondary | ICD-10-CM | POA: Diagnosis not present

## 2021-08-01 DIAGNOSIS — F17210 Nicotine dependence, cigarettes, uncomplicated: Secondary | ICD-10-CM | POA: Insufficient documentation

## 2021-08-01 DIAGNOSIS — N135 Crossing vessel and stricture of ureter without hydronephrosis: Secondary | ICD-10-CM | POA: Diagnosis not present

## 2021-08-01 DIAGNOSIS — N481 Balanitis: Secondary | ICD-10-CM | POA: Diagnosis not present

## 2021-08-01 DIAGNOSIS — I2510 Atherosclerotic heart disease of native coronary artery without angina pectoris: Secondary | ICD-10-CM | POA: Diagnosis not present

## 2021-08-01 DIAGNOSIS — E782 Mixed hyperlipidemia: Secondary | ICD-10-CM | POA: Diagnosis not present

## 2021-08-01 DIAGNOSIS — Z8673 Personal history of transient ischemic attack (TIA), and cerebral infarction without residual deficits: Secondary | ICD-10-CM | POA: Insufficient documentation

## 2021-08-01 DIAGNOSIS — Z955 Presence of coronary angioplasty implant and graft: Secondary | ICD-10-CM | POA: Insufficient documentation

## 2021-08-01 DIAGNOSIS — N35912 Unspecified bulbous urethral stricture, male: Secondary | ICD-10-CM | POA: Diagnosis not present

## 2021-08-01 HISTORY — PX: TRANSURETHRAL RESECTION OF BLADDER TUMOR: SHX2575

## 2021-08-01 HISTORY — PX: CYSTOSCOPY WITH URETHRAL DILATATION: SHX5125

## 2021-08-01 LAB — GLUCOSE, CAPILLARY
Glucose-Capillary: 152 mg/dL — ABNORMAL HIGH (ref 70–99)
Glucose-Capillary: 139 mg/dL — ABNORMAL HIGH (ref 70–99)
Glucose-Capillary: 150 mg/dL — ABNORMAL HIGH (ref 70–99)
Glucose-Capillary: 254 mg/dL — ABNORMAL HIGH (ref 70–99)

## 2021-08-01 SURGERY — TURBT (TRANSURETHRAL RESECTION OF BLADDER TUMOR)
Anesthesia: General

## 2021-08-01 MED ORDER — INSULIN ASPART (W/NIACINAMIDE) 100 UNIT/ML ~~LOC~~ SOPN: 10.0000 [IU] | PEN_INJECTOR | Freq: Two times a day (BID) | SUBCUTANEOUS | Status: DC | PRN

## 2021-08-01 MED ORDER — ATORVASTATIN CALCIUM 80 MG PO TABS
80.0000 mg | ORAL_TABLET | Freq: Every day | ORAL | Status: DC
  Administered 2021-08-01 – 2021-08-02 (×2): 80 mg via ORAL
  Filled 2021-08-01 (×2): qty 1

## 2021-08-01 MED ORDER — METOPROLOL SUCCINATE ER 25 MG PO TB24
25.0000 mg | ORAL_TABLET | Freq: Every day | ORAL | Status: DC
  Administered 2021-08-01: 13:00:00 25 mg via ORAL
  Filled 2021-08-01: qty 1

## 2021-08-01 MED ORDER — SODIUM CHLORIDE 0.9% FLUSH: 3.0000 mL | INTRAVENOUS | Status: DC | PRN

## 2021-08-01 MED ORDER — GABAPENTIN 300 MG PO CAPS
300.0000 mg | ORAL_CAPSULE | Freq: Three times a day (TID) | ORAL | Status: DC
  Administered 2021-08-01 – 2021-08-02 (×3): 300 mg via ORAL
  Filled 2021-08-01 (×3): qty 1

## 2021-08-01 MED ORDER — TAMSULOSIN HCL 0.4 MG PO CAPS
0.8000 mg | ORAL_CAPSULE | Freq: Every day | ORAL | Status: DC
  Administered 2021-08-01: 21:00:00 0.8 mg via ORAL
  Filled 2021-08-01: qty 2

## 2021-08-01 MED ORDER — ONDANSETRON HCL 4 MG/2ML IJ SOLN: 4.0000 mg | INTRAMUSCULAR | Status: DC | PRN

## 2021-08-01 MED ORDER — ZOLPIDEM TARTRATE 5 MG PO TABS
5.0000 mg | ORAL_TABLET | Freq: Every day | ORAL | Status: DC
  Administered 2021-08-01: 21:00:00 5 mg via ORAL
  Filled 2021-08-01: qty 1

## 2021-08-01 MED ORDER — OXYCODONE HCL 5 MG PO TABS
ORAL_TABLET | ORAL | Status: AC
  Administered 2021-08-01: 16:00:00 5 mg via ORAL
  Filled 2021-08-01: qty 1

## 2021-08-01 MED ORDER — CYCLOBENZAPRINE HCL 5 MG PO TABS: 5.0000 mg | ORAL_TABLET | Freq: Three times a day (TID) | ORAL | Status: DC | PRN

## 2021-08-01 MED ORDER — FENTANYL CITRATE (PF) 100 MCG/2ML IJ SOLN
INTRAMUSCULAR | Status: DC | PRN
  Administered 2021-08-01 (×2): 25 ug via INTRAVENOUS

## 2021-08-01 MED ORDER — ORAL CARE MOUTH RINSE: 15.0000 mL | Freq: Once | OROMUCOSAL | Status: AC

## 2021-08-01 MED ORDER — LISINOPRIL 2.5 MG PO TABS
2.5000 mg | ORAL_TABLET | Freq: Every day | ORAL | Status: DC
  Administered 2021-08-01 – 2021-08-02 (×2): 2.5 mg via ORAL
  Filled 2021-08-01 (×2): qty 1

## 2021-08-01 MED ORDER — PROPOFOL 10 MG/ML IV BOLUS
INTRAVENOUS | Status: AC
  Filled 2021-08-01: qty 20

## 2021-08-01 MED ORDER — HYDROCODONE-ACETAMINOPHEN 5-325 MG PO TABS: 1.0000 | ORAL_TABLET | Freq: Four times a day (QID) | ORAL | 0 refills | Status: DC | PRN

## 2021-08-01 MED ORDER — OXYCODONE HCL 5 MG PO TABS: 5.0000 mg | ORAL_TABLET | ORAL | Status: DC | PRN

## 2021-08-01 MED ORDER — CEPHALEXIN 500 MG PO CAPS: 500.0000 mg | ORAL_CAPSULE | Freq: Two times a day (BID) | ORAL | 0 refills | Status: AC

## 2021-08-01 MED ORDER — CEFAZOLIN SODIUM-DEXTROSE 2-4 GM/100ML-% IV SOLN
2.0000 g | Freq: Three times a day (TID) | INTRAVENOUS | Status: AC
  Administered 2021-08-01 – 2021-08-02 (×2): 2 g via INTRAVENOUS
  Filled 2021-08-01 (×2): qty 100

## 2021-08-01 MED ORDER — BELLADONNA ALKALOIDS-OPIUM 16.2-60 MG RE SUPP: 1.0000 | Freq: Four times a day (QID) | RECTAL | Status: DC | PRN

## 2021-08-01 MED ORDER — ONDANSETRON HCL 4 MG/2ML IJ SOLN
INTRAMUSCULAR | Status: DC | PRN
  Administered 2021-08-01: 4 mg via INTRAVENOUS

## 2021-08-01 MED ORDER — PHENYLEPHRINE 40 MCG/ML (10ML) SYRINGE FOR IV PUSH (FOR BLOOD PRESSURE SUPPORT)
PREFILLED_SYRINGE | INTRAVENOUS | Status: DC | PRN
  Administered 2021-08-01: 120 ug via INTRAVENOUS

## 2021-08-01 MED ORDER — LACTATED RINGERS IV SOLN: INTRAVENOUS | Status: DC

## 2021-08-01 MED ORDER — SODIUM CHLORIDE 0.9 % IR SOLN
3000.0000 mL | Status: DC
  Administered 2021-08-01: 17:00:00 3000 mL

## 2021-08-01 MED ORDER — FENTANYL CITRATE PF 50 MCG/ML IJ SOSY
25.0000 ug | PREFILLED_SYRINGE | INTRAMUSCULAR | Status: DC | PRN
  Administered 2021-08-01: 15:00:00 50 ug via INTRAVENOUS

## 2021-08-01 MED ORDER — MORPHINE SULFATE (PF) 2 MG/ML IV SOLN: 2.0000 mg | INTRAVENOUS | Status: DC | PRN

## 2021-08-01 MED ORDER — OXYCODONE HCL 5 MG PO TABS: 5.0000 mg | ORAL_TABLET | Freq: Once | ORAL | Status: AC | PRN

## 2021-08-01 MED ORDER — METOPROLOL SUCCINATE ER 25 MG PO TB24
25.0000 mg | ORAL_TABLET | Freq: Every day | ORAL | Status: DC
  Administered 2021-08-01 – 2021-08-02 (×2): 25 mg via ORAL
  Filled 2021-08-01 (×2): qty 1

## 2021-08-01 MED ORDER — CHLORHEXIDINE GLUCONATE 0.12 % MT SOLN
15.0000 mL | Freq: Once | OROMUCOSAL | Status: AC
  Administered 2021-08-01: 13:00:00 15 mL via OROMUCOSAL

## 2021-08-01 MED ORDER — PROPOFOL 10 MG/ML IV BOLUS
INTRAVENOUS | Status: DC | PRN
  Administered 2021-08-01: 140 mg via INTRAVENOUS

## 2021-08-01 MED ORDER — ACETAMINOPHEN 500 MG PO TABS
1000.0000 mg | ORAL_TABLET | Freq: Four times a day (QID) | ORAL | Status: AC
  Administered 2021-08-01 – 2021-08-02 (×4): 1000 mg via ORAL
  Filled 2021-08-01 (×4): qty 2

## 2021-08-01 MED ORDER — DAPAGLIFLOZIN PROPANEDIOL 5 MG PO TABS
5.0000 mg | ORAL_TABLET | Freq: Every day | ORAL | Status: DC
  Administered 2021-08-02: 08:00:00 5 mg via ORAL
  Filled 2021-08-01: qty 1

## 2021-08-01 MED ORDER — LIDOCAINE 2% (20 MG/ML) 5 ML SYRINGE
INTRAMUSCULAR | Status: DC | PRN
  Administered 2021-08-01: 100 mg via INTRAVENOUS

## 2021-08-01 MED ORDER — CEFAZOLIN SODIUM-DEXTROSE 2-4 GM/100ML-% IV SOLN
2.0000 g | INTRAVENOUS | Status: AC
  Administered 2021-08-01: 14:00:00 2 g via INTRAVENOUS

## 2021-08-01 MED ORDER — FENTANYL CITRATE (PF) 100 MCG/2ML IJ SOLN
INTRAMUSCULAR | Status: AC
  Filled 2021-08-01: qty 2

## 2021-08-01 MED ORDER — SENNOSIDES-DOCUSATE SODIUM 8.6-50 MG PO TABS
2.0000 | ORAL_TABLET | Freq: Every day | ORAL | Status: DC
  Administered 2021-08-01: 21:00:00 2 via ORAL
  Filled 2021-08-01: qty 2

## 2021-08-01 MED ORDER — SODIUM CHLORIDE 0.9 % IV SOLN: 250.0000 mL | INTRAVENOUS | Status: DC | PRN

## 2021-08-01 MED ORDER — DEXTROSE-NACL 5-0.45 % IV SOLN: INTRAVENOUS | Status: DC

## 2021-08-01 MED ORDER — CEFAZOLIN SODIUM-DEXTROSE 2-4 GM/100ML-% IV SOLN
INTRAVENOUS | Status: AC
  Filled 2021-08-01: qty 100

## 2021-08-01 MED ORDER — ACETAMINOPHEN 500 MG PO TABS
1000.0000 mg | ORAL_TABLET | Freq: Once | ORAL | Status: AC
  Administered 2021-08-01: 13:00:00 1000 mg via ORAL
  Filled 2021-08-01: qty 2

## 2021-08-01 MED ORDER — INSULIN ASPART 100 UNIT/ML IJ SOLN
0.0000 [IU] | Freq: Three times a day (TID) | INTRAMUSCULAR | Status: DC
  Administered 2021-08-02: 08:00:00 7 [IU] via SUBCUTANEOUS

## 2021-08-01 MED ORDER — BACITRACIN-NEOMYCIN-POLYMYXIN 400-5-5000 EX OINT: 1.0000 "application " | TOPICAL_OINTMENT | Freq: Three times a day (TID) | CUTANEOUS | Status: DC | PRN

## 2021-08-01 MED ORDER — SODIUM CHLORIDE 0.9% FLUSH: 3.0000 mL | Freq: Two times a day (BID) | INTRAVENOUS | Status: DC

## 2021-08-01 MED ORDER — OXYCODONE HCL 5 MG/5ML PO SOLN: 5.0000 mg | Freq: Once | ORAL | Status: AC | PRN

## 2021-08-01 MED ORDER — FENTANYL CITRATE PF 50 MCG/ML IJ SOSY
PREFILLED_SYRINGE | INTRAMUSCULAR | Status: AC
  Filled 2021-08-01: qty 3

## 2021-08-01 MED ORDER — DEXAMETHASONE SODIUM PHOSPHATE 10 MG/ML IJ SOLN
INTRAMUSCULAR | Status: DC | PRN
  Administered 2021-08-01: 4 mg via INTRAVENOUS

## 2021-08-01 MED ORDER — INSULIN GLARGINE-YFGN 100 UNIT/ML ~~LOC~~ SOLN
65.0000 [IU] | Freq: Every day | SUBCUTANEOUS | Status: DC
  Administered 2021-08-01: 21:00:00 65 [IU] via SUBCUTANEOUS
  Filled 2021-08-01 (×2): qty 0.65

## 2021-08-01 MED ORDER — EZETIMIBE 10 MG PO TABS
10.0000 mg | ORAL_TABLET | Freq: Every day | ORAL | Status: DC
  Administered 2021-08-01 – 2021-08-02 (×2): 10 mg via ORAL
  Filled 2021-08-01 (×2): qty 1

## 2021-08-01 MED ORDER — SODIUM CHLORIDE 0.9 % IR SOLN
Status: DC | PRN
  Administered 2021-08-01 (×5): 3000 mL via INTRAVESICAL

## 2021-08-01 SURGICAL SUPPLY — 30 items
BAG URINE DRAIN 2000ML AR STRL (UROLOGICAL SUPPLIES) ×3 IMPLANT
BAG URO CATCHER STRL LF (MISCELLANEOUS) ×3 IMPLANT
BALLN NEPHROSTOMY (BALLOONS) ×3
BALLOON NEPHROSTOMY (BALLOONS) IMPLANT
CATH FOLEY 2W COUNCIL 20FR 5CC (CATHETERS) IMPLANT
CATH FOLEY 3WAY 30CC 22FR (CATHETERS) ×2 IMPLANT
CATH ROBINSON RED A/P 14FR (CATHETERS) ×3 IMPLANT
CATH URET 5FR 28IN CONE TIP (BALLOONS)
CATH URET 5FR 70CM CONE TIP (BALLOONS) IMPLANT
CATH URETL OPEN END 6FR 70 (CATHETERS) IMPLANT
CLOTH BEACON ORANGE TIMEOUT ST (SAFETY) ×3 IMPLANT
DRAPE FOOT SWITCH (DRAPES) ×3 IMPLANT
ELECT REM PT RETURN 15FT ADLT (MISCELLANEOUS) ×3 IMPLANT
GLOVE SURG ENC MOIS LTX SZ6.5 (GLOVE) ×3 IMPLANT
GLOVE SURG ENC MOIS LTX SZ7.5 (GLOVE) ×3 IMPLANT
GOWN STRL REUS W/TWL LRG LVL3 (GOWN DISPOSABLE) ×3 IMPLANT
GUIDEWIRE ANG ZIPWIRE 038X150 (WIRE) IMPLANT
GUIDEWIRE STR DUAL SENSOR (WIRE) ×3 IMPLANT
KIT TURNOVER KIT A (KITS) IMPLANT
LOOP CUT BIPOLAR 24F LRG (ELECTROSURGICAL) IMPLANT
MANIFOLD NEPTUNE II (INSTRUMENTS) ×3 IMPLANT
NS IRRIG 1000ML POUR BTL (IV SOLUTION) IMPLANT
PACK CYSTO (CUSTOM PROCEDURE TRAY) ×3 IMPLANT
PENCIL SMOKE EVACUATOR (MISCELLANEOUS) IMPLANT
SYR TOOMEY IRRIG 70ML (MISCELLANEOUS)
SYRINGE TOOMEY IRRIG 70ML (MISCELLANEOUS) IMPLANT
TUBING CONNECTING 10 (TUBING) ×2 IMPLANT
TUBING CONNECTING 10' (TUBING) ×1
TUBING UROLOGY SET (TUBING) ×3 IMPLANT
WATER STERILE IRR 3000ML UROMA (IV SOLUTION) ×3 IMPLANT

## 2021-08-01 NOTE — Anesthesia Preprocedure Evaluation (Addendum)
Anesthesia Evaluation  Patient identified by MRN, date of birth, ID band Patient awake    Reviewed: Allergy & Precautions, NPO status , Patient's Chart, lab work & pertinent test results, reviewed documented beta blocker date and time   History of Anesthesia Complications Negative for: history of anesthetic complications  Airway Mallampati: II  TM Distance: >3 FB Neck ROM: Full    Dental  (+) Edentulous Lower, Edentulous Upper   Pulmonary sleep apnea , former smoker,    Pulmonary exam normal        Cardiovascular hypertension, Pt. on medications and Pt. on home beta blockers + CAD, + Past MI and + Cardiac Stents (2011)  Normal cardiovascular exam     Neuro/Psych CVA (2019) negative psych ROS   GI/Hepatic negative GI ROS, Neg liver ROS,   Endo/Other  diabetes, Type 2, Insulin Dependent, Oral Hypoglycemic Agents  Renal/GU negative Renal ROS   BPH    Musculoskeletal  (+) Arthritis ,   Abdominal   Peds  Hematology negative hematology ROS (+)   Anesthesia Other Findings Day of surgery medications reviewed with patient.  Reproductive/Obstetrics negative OB ROS                            Anesthesia Physical Anesthesia Plan  ASA: 3  Anesthesia Plan: General   Post-op Pain Management: Tylenol PO (pre-op)   Induction: Intravenous  PONV Risk Score and Plan: 3 and Treatment may vary due to age or medical condition, Dexamethasone and Ondansetron  Airway Management Planned: LMA  Additional Equipment: None  Intra-op Plan:   Post-operative Plan: Extubation in OR  Informed Consent: I have reviewed the patients History and Physical, chart, labs and discussed the procedure including the risks, benefits and alternatives for the proposed anesthesia with the patient or authorized representative who has indicated his/her understanding and acceptance.     Dental advisory given  Plan Discussed  with: CRNA  Anesthesia Plan Comments:        Anesthesia Quick Evaluation

## 2021-08-01 NOTE — Op Note (Addendum)
Preoperative diagnosis: Bladder outlet obstruction secondary to BPH Urethral stricture   Postoperative diagnosis:  Bladder outlet obstruction secondary to BPH Urethral stricture   Procedure:  Cystoscopy Transurethral resection of the prostate Urethral stricture balloon dilation  Surgeon: Jacalyn Lefevre, M.D.  Anesthesia: General  Complications: None  EBL: Minimal  Specimens: Prostate chips  Indication: Joshua Nelson is a patient with bladder outlet obstruction secondary to benign prostatic hyperplasia. After reviewing the management options for treatment, he elected to proceed with the above surgical procedure(s). We have discussed the potential benefits and risks of the procedure, side effects of the proposed treatment, the likelihood of the patient achieving the goals of the procedure, and any potential problems that might occur during the procedure or recuperation. Informed consent has been obtained.  Description of procedure:  The patient was taken to the operating room and general anesthesia was induced.  The patient was placed in the dorsal lithotomy position, prepped and draped in the usual sterile fashion, and preoperative antibiotics were administered. A preoperative time-out was performed.   Initially sounds were used to dilate patient's urethra meatus from 20 [place] to 26 [place].  Next, cystourethroscopy was performed. There was widebore bulbar urethral stricture that was > 2cm.  The 21Fr rigid cystoscope could not traverse this area.  A sensor wire was then placed through the cystoscope and advanced through the stricture into the bladder.  The Ultrex balloon dilator was then placed over the wire and this area was balloon dilated to 24 [place].  The balloon was deflated and dilator removed.  Next 11 French rigid cystoscope was placed in the urethral meatus and advanced in the bladder and direct visualization.  Cystoscopy took place and demonstrated bilobar prostatic  hypertrophy. The bladder was then systematically examined in its entirety. There was no evidence of any bladder tumors, stones, or other mucosal pathology.  The cystoscope was removed and the resectoscope was then placed in the urethral meatus using the visual obturator.  The visual obturator was removed and the resectoscope was assembled with the bipolar loop.  The prostate adenoma was then resected utilizing loop cautery resection with the bipolar cutting loop.  The prostate adenoma from the bladder neck back to the verumontanum was resected beginning at the six o'clock position and then extended to include the right and left lobes of the prostate and anterior prostate. Care was taken not to resect distal to the verumontanum.   Hemostasis was then achieved with the cautery and the bladder was emptied and reinspected with no significant bleeding noted at the end of the procedure.    A 22Fr 3 way catheter was then placed into the bladder.  The patient appeared to tolerate the procedure well and without complications.  The patient was able to be awakened and transferred to the recovery unit in satisfactory condition.   Plan: Patient will be kept for observation overnight with continuous bladder irrigation.  He will continue the Foley catheter for 7 days due to urethral stricture dilation.  He has an appointment in the office in 1 week.

## 2021-08-01 NOTE — Discharge Instructions (Signed)

## 2021-08-01 NOTE — H&P (Signed)
CC/HPI: cc: LUTs, ED   04/27/21: 70 year old man with a history of lower urinary tract symptoms and ED referred for evaluation. Patient had been tried on tamsulosin and Myrbetriq by his PCP. He has not yet tried anything for ED. He has a history of MI and 3 cardiac stents in 2011. His main complaint is urinary urgency and urge incontinence. He has run out of all medication. He denies a history of gross hematuria, kidney stones or family history of prostate cancer. He had a stroke 2 years ago in this is went ED exacerbated. He was last sexually active with his wife 3-4 months ago. His urinalysis today is positive for Trichomonas.   IPSS: 3, 1, 3, 5, not answered, 2, 3=17/35  Mostly dissatisfied 4/6   06/12/21: 70 year old man with lower urinary tract symptoms here for follow-up. Patient was treated for trichomonas at his last visit. He was restarted on tamsulosin 0.8 mg nightly. He has not noticed a big change in symptoms. He still has nocturia 2-3 times a night and urinary urgency and urge incontinence. He also feels like he has infection on the glans of his penis. He has been applying topical steroid cream.     ALLERGIES: None   MEDICATIONS: Lisinopril 2.5 mg tablet 1 tablet PO Daily  Metoprolol Succinate 25 mg tablet, extended release 24 hr 1 tablet PO Daily  Tamsulosin Hcl 0.4 mg capsule 2 capsule PO Q HS  Atorvastatin Calcium 80 mg tablet 1 tablet PO Daily  Ezetimibe 10 mg tablet 1 tablet PO Daily  Fiasp Flextouch 100 unit/ml (3 ml) insulin pen 1 PO Daily  Freestyle Libre 2 Reader 1 PO Daily  Freestyle Libre 2 Sensor 1 PO Daily  Gabapentin 300 mg capsule 1 capsule PO Daily  Lantus Solostar 100 unit/ml (3 ml) insulin pen 1 PO Daily  Meclizine Hcl 25 mg tablet 1 tablet PO Daily  Meloxicam 15 mg tablet 1 tablet PO Daily  Ofloxacin 0.3 % drops 1 drop PO Daily  Omega3 1 PO Daily  Zolpidem Tartrate 10 mg tablet 1 tablet PO Daily     GU PSH: None   NON-GU PSH: None   GU PMH: Nocturia -  04/27/2021 Non-gonococcal urethritis - 04/27/2021 Urge incontinence - 04/27/2021 Urinary Frequency - 04/27/2021 Urinary Urgency - 04/27/2021      PMH Notes: bleeding disorder   NON-GU PMH: Arthritis Diabetes Type 2 Gout Hypercholesterolemia Hypertension Stroke/TIA    FAMILY HISTORY: None   SOCIAL HISTORY: Marital Status: Unknown Current Smoking Status: Patient smokes. Has smoked since 04/13/1986. Smokes 4+ packs per day.   Tobacco Use Assessment Completed: Used Tobacco in last 30 days? Drinks 4+ caffeinated drinks per day.    REVIEW OF SYSTEMS:    GU Review Male:   Patient reports frequent urination, get up at night to urinate, and leakage of urine. Patient denies hard to postpone urination, burning/ pain with urination, stream starts and stops, trouble starting your stream, have to strain to urinate , erection problems, and penile pain.  Gastrointestinal (Upper):   Patient denies nausea, vomiting, and indigestion/ heartburn.  Gastrointestinal (Lower):   Patient denies diarrhea and constipation.  Constitutional:   Patient denies fever, night sweats, weight loss, and fatigue.  Skin:   Patient denies skin rash/ lesion and itching.  Eyes:   Patient denies blurred vision and double vision.  Ears/ Nose/ Throat:   Patient denies sore throat and sinus problems.  Hematologic/Lymphatic:   Patient denies swollen glands and easy bruising.  Cardiovascular:  Patient denies leg swelling and chest pains.  Respiratory:   Patient denies cough and shortness of breath.  Endocrine:   Patient denies excessive thirst.  Musculoskeletal:   Patient denies back pain and joint pain.  Neurological:   Patient denies headaches and dizziness.  Psychologic:   Patient denies depression and anxiety.   VITAL SIGNS:      06/12/2021 11:20 AM  BP 136/82 mmHg  Heart Rate 80 /min  Temperature 98.6 F / 37 C   GU PHYSICAL EXAMINATION:      Notes: Patient with redundant foreskin, glans erythematous and irritated  consistent with balanitis   MULTI-SYSTEM PHYSICAL EXAMINATION:    Constitutional: Well-nourished. No physical deformities. Normally developed. Good grooming.  Neck: Neck symmetrical, not swollen. Normal tracheal position.  Respiratory: No labored breathing, no use of accessory muscles.   Skin: No paleness, no jaundice, no cyanosis. No lesion, no ulcer, no rash.  Neurologic / Psychiatric: Oriented to time, oriented to place, oriented to person. No depression, no anxiety, no agitation.  Gastrointestinal: No rigidity  Eyes: Normal conjunctivae. Normal eyelids.  Ears, Nose, Mouth, and Throat: Left ear no scars, no lesions, no masses. Right ear no scars, no lesions, no masses. Nose no scars, no lesions, no masses. Normal hearing. Normal lips.  Musculoskeletal: Normal gait and station of head and neck.     Complexity of Data:  Records Review:   Previous Patient Records, POC Tool  Urine Test Review:   Urinalysis  Urodynamics Review:   Review Bladder Scan, Review Flow Rate  Notes:                     04/25/2021: BUN 12, creatinine 0.83   PROCEDURES:         Flexible Cystoscopy - 52000  Risks, benefits, and some of the potential complications of the procedure were discussed at length with the patient including infection, bleeding, voiding discomfort, urinary retention, fever, chills, sepsis, and others. All questions were answered. Informed consent was obtained. Sterile technique and intraurethral analgesia were used.  Meatus:  Normal size. Normal location. Normal condition.  Urethra:  Mild panurethral strictures however scope easily able to traverse  External Sphincter:  Normal.  Verumontanum:  Normal.  Prostate:  Obstructing lateral lobes with no intravesical median lobe  Bladder Neck:  Non-obstructing.  Ureteral Orifices:  Normal location. Normal size. Normal shape. Effluxed clear urine.  Bladder:  Mild trabeculation. No tumors. Normal mucosa. No stones.      The lower urinary tract was  carefully examined. The procedure was well-tolerated and without complications. Instructions were given to call the office immediately for bloody urine, difficulty urinating, urinary retention, painful or frequent urination, fever, chills, nausea, vomiting or other illness. The patient stated that he understood these instructions and would comply with them.        Flow Rate - 51741  Flow Time: 0:48 min:sec  Time of Peak Flow: : 7 min:sec  Voided Volume: 351 cc  Peak Flow Rate: 12 cc/sec  Total Void Time: 0:71 min:sec  Average Flow Rate: 7 cc/sec           PVR Ultrasound - 85631  Scanned Volume: 187 cc         Urinalysis - 81003 Dipstick Dipstick Cont'd  Color: Yellow Bilirubin: Neg  Appearance: Clear Ketones: Neg  Specific Gravity: 1.025 Blood: Neg  pH: <=5.0 Protein: Neg  Glucose: 2+ Urobilinogen: 0.2    Nitrites: Neg    Leukocyte Esterase: Neg  ASSESSMENT:      ICD-10 Details  1 GU:   BPH w/LUTS - N40.1 Chronic, Stable  2   Urge incontinence - N39.41 Chronic, Stable  3   Urinary Frequency - R35.0 Chronic, Stable  4   Nocturia - R35.1 Chronic, Stable  5   Balanitis - N48.1 Chronic, Stable  6   ED due to arterial insufficiency - N52.01 Chronic, Stable   PLAN:            Medications New Meds: Clotrimazole-Betamethasone 1 %-0.05 % cream 1 gram Topical BID apply to penis twice a day for 2 weeks  #45  1 Refill(s)  Sildenafil Citrate 100 mg tablet 1 tablet PO Daily take 1/2 - 1 tab prior to sexual activity  #5  0 Refill(s)            Document Letter(s):  Created for Patient: Clinical Summary         Notes:   1. BPH with LUTS: Patient with evidence of bladder outlet obstruction based on cystoscopy. Flow rate confirms decreased Q-Max and Q average. Discussed with patient proceeding with transurethral resection of the prostate. Risks and benefits as well as alternatives to this procedure were discussed with him in detail including but not limited to bleeding, infection,  damage to surrounding structures, regrowth, worsening urgency, need for future intervention, retrograde ejaculation, need for foley. Patient will also likely need urethral dilation at the same time. He will keep Foley catheter for 1 week based on panurethral stricture findings.   ED: Patient denies taking any medication for chest pain denies chest pain in general. He was given prescription for sildenafil 100 mg. And counseled how to best take this.   Balanitis: will give patient topical antifungal/steroid cream

## 2021-08-01 NOTE — Anesthesia Procedure Notes (Signed)
Procedure Name: LMA Insertion Date/Time: 08/01/2021 1:58 PM Performed by: Milford Cage, CRNA Pre-anesthesia Checklist: Patient identified, Emergency Drugs available, Suction available and Patient being monitored Patient Re-evaluated:Patient Re-evaluated prior to induction Oxygen Delivery Method: Circle system utilized Preoxygenation: Pre-oxygenation with 100% oxygen Induction Type: IV induction Ventilation: Mask ventilation without difficulty LMA: LMA inserted LMA Size: 4.0 Number of attempts: 1 Tube secured with: Tape Dental Injury: Teeth and Oropharynx as per pre-operative assessment

## 2021-08-01 NOTE — Transfer of Care (Signed)
Immediate Anesthesia Transfer of Care Note  Patient: Joshua Nelson  Procedure(s) Performed: TRANSURETHRAL RESECTION OF PROSTATE CYSTOSCOPY WITH BALLOON URETHRAL DILATATION  Patient Location: PACU  Anesthesia Type:General  Level of Consciousness: awake  Airway & Oxygen Therapy: Patient Spontanous Breathing and Patient connected to face mask oxygen  Post-op Assessment: Report given to RN and Post -op Vital signs reviewed and stable  Post vital signs: Reviewed and stable  Last Vitals:  Vitals Value Taken Time  BP    Temp    Pulse    Resp    SpO2      Last Pain:  Vitals:   08/01/21 1243  TempSrc:   PainSc: 0-No pain      Patients Stated Pain Goal: 3 (78/29/56 2130)  Complications: No notable events documented.

## 2021-08-01 NOTE — Anesthesia Postprocedure Evaluation (Signed)
Anesthesia Post Note  Patient: Joshua Nelson  Procedure(s) Performed: TRANSURETHRAL RESECTION OF PROSTATE CYSTOSCOPY WITH BALLOON URETHRAL DILATATION     Patient location during evaluation: PACU Anesthesia Type: General Level of consciousness: awake and alert and oriented Pain management: pain level controlled Vital Signs Assessment: post-procedure vital signs reviewed and stable Respiratory status: spontaneous breathing, nonlabored ventilation and respiratory function stable Cardiovascular status: blood pressure returned to baseline Postop Assessment: no apparent nausea or vomiting Anesthetic complications: no   No notable events documented.  Last Vitals:  Vitals:   08/01/21 1511 08/01/21 1530  BP: (!) 159/82 (!) 159/86  Pulse: 70 71  Resp: 15 14  Temp: 36.5 C   SpO2: 100% 99%    Last Pain:  Vitals:   08/01/21 1542  TempSrc:   PainSc: [place]

## 2021-08-01 NOTE — Interval H&P Note (Signed)
History and Physical Interval Note:  08/01/2021 1:40 PM  Joshua Nelson  has presented today for surgery, with the diagnosis of BENIGN PROSTATIC HYPERPLASIA, URETHRAL STRICTURE.  The various methods of treatment have been discussed with the patient and family. After consideration of risks, benefits and other options for treatment, the patient has consented to  Procedure(s) with comments: TRANSURETHRAL RESECTION OF BLADDER TUMOR (TURBT) (N/A) - 90 MINS CYSTOSCOPY WITH URETHRAL DILATATION (N/A) as a surgical intervention.  The patient's history has been reviewed, patient examined, no change in status, stable for surgery.  I have reviewed the patient's chart and labs.  Questions were answered to the patient's satisfaction.     Tracye Szuch D Lena Fieldhouse

## 2021-08-02 ENCOUNTER — Encounter (HOSPITAL_COMMUNITY): Payer: Self-pay | Admitting: Urology

## 2021-08-02 DIAGNOSIS — I252 Old myocardial infarction: Secondary | ICD-10-CM | POA: Diagnosis not present

## 2021-08-02 DIAGNOSIS — Z955 Presence of coronary angioplasty implant and graft: Secondary | ICD-10-CM | POA: Diagnosis not present

## 2021-08-02 DIAGNOSIS — C61 Malignant neoplasm of prostate: Secondary | ICD-10-CM | POA: Diagnosis not present

## 2021-08-02 DIAGNOSIS — N401 Enlarged prostate with lower urinary tract symptoms: Secondary | ICD-10-CM | POA: Diagnosis not present

## 2021-08-02 DIAGNOSIS — N35919 Unspecified urethral stricture, male, unspecified site: Secondary | ICD-10-CM | POA: Diagnosis not present

## 2021-08-02 DIAGNOSIS — I10 Essential (primary) hypertension: Secondary | ICD-10-CM | POA: Diagnosis not present

## 2021-08-02 DIAGNOSIS — I2510 Atherosclerotic heart disease of native coronary artery without angina pectoris: Secondary | ICD-10-CM | POA: Diagnosis not present

## 2021-08-02 DIAGNOSIS — N320 Bladder-neck obstruction: Secondary | ICD-10-CM | POA: Diagnosis not present

## 2021-08-02 DIAGNOSIS — N138 Other obstructive and reflux uropathy: Secondary | ICD-10-CM | POA: Diagnosis not present

## 2021-08-02 LAB — GLUCOSE, CAPILLARY: Glucose-Capillary: 209 mg/dL — ABNORMAL HIGH (ref 70–99)

## 2021-08-02 LAB — SURGICAL PATHOLOGY

## 2021-08-02 MED ORDER — CHLORHEXIDINE GLUCONATE CLOTH 2 % EX PADS
6.0000 | MEDICATED_PAD | Freq: Every day | CUTANEOUS | Status: DC
  Administered 2021-08-02: 12:00:00 6 via TOPICAL

## 2021-08-02 NOTE — Progress Notes (Signed)
Patient has been explained and taught discharge instructions. IV has been removed and site is clean, dry, and intact. Patient has no further questions at this time.  Layla Maw, RN

## 2021-08-02 NOTE — Discharge Summary (Signed)
Date of admission: 08/01/2021  Date of discharge: 08/02/2021  Admission diagnosis: BPH w/ LUTS  Discharge diagnosis: BPH w/ LUTS  Procedures:  Bipolar TURP with Dr. Claudia Desanctis  History and Physical: For full details, please see admission history and physical. Briefly, FAWAZ BORQUEZ is a 70 y.o. year old patient with a history of BPH w/ LUTS.  He is s/p TURP with Dr. Claudia Desanctis on 08/01/21.   Hospital Course: Postoperatively, the patient was monitored on the floor.  POD1 he had no complaints and his catheter was draining clear-yellow urine w/o CBI.  He was discharged home with his Foley catheter with instructions to RTC for a voiding trial.   Physical Exam:  General: Alert and oriented CV: RRR, palpable distal pulses Lungs: CTAB, equal chest rise Abdomen: Soft, NTND, no rebound or guarding GU:  Foley in place and draining clear-yellow urine Ext: NT, No erythema  Laboratory values: No results for input(s): HGB, HCT in the last 72 hours. No results for input(s): CREATININE in the last 72 hours.  Disposition: Home  Discharge instruction: The patient was instructed to be ambulatory but told to refrain from heavy lifting, strenuous activity, or driving.  Discharge medications:  Allergies as of 08/02/2021   No Known Allergies      Medication List     STOP taking these medications    Colchicine 0.6 MG Caps Commonly known as: Mitigare       TAKE these medications    atorvastatin 80 MG tablet Commonly known as: LIPITOR TAKE ONE (1) TABLET ONCE DAILY   cephALEXin 500 MG capsule Commonly known as: KEFLEX Take 1 capsule (500 mg total) by mouth 2 (two) times daily for 7 days.   cyclobenzaprine 5 MG tablet Commonly known as: FLEXERIL Take 1 tablet (5 mg total) by mouth 3 (three) times daily as needed for muscle spasms.   dapagliflozin propanediol 5 MG Tabs tablet Commonly known as: Farxiga Take 1 tablet (5 mg total) by mouth daily. What changed: additional instructions    ezetimibe 10 MG tablet Commonly known as: ZETIA TAKE ONE (1) TABLET ONCE DAILY   Fiasp FlexTouch 100 UNIT/ML FlexTouch Pen Generic drug: insulin aspart Dose AT THE TIME OF MEAL as follows: 100-150 = 4 U, 151-200 = 8 U, 201-250 = 16 U, 251-300 = 20 U, Greater than 300 = 24 U. What changed:  how much to take how to take this when to take this additional instructions   FreeStyle Libre 2 Reader Devi Check qac and qhs   FreeStyle Libre 2 Sensor Misc 1 each by Does not apply route every 14 (fourteen) days.   gabapentin 300 MG capsule Commonly known as: NEURONTIN TAKE ONE (1) CAPSULE THREE (3) TIMES EACH DAY   HYDROcodone-acetaminophen 5-325 MG tablet Commonly known as: NORCO/VICODIN Take 1 tablet by mouth every 6 (six) hours as needed for moderate pain.   ibuprofen 800 MG tablet Commonly known as: ADVIL Take 1 tablet (800 mg total) by mouth every 8 (eight) hours as needed for moderate pain.   Lantus SoloStar 100 UNIT/ML Solostar Pen Generic drug: insulin glargine Inject 65 Units into the skin daily. What changed: additional instructions   lisinopril 2.5 MG tablet Commonly known as: ZESTRIL Once daily What changed: additional instructions   Meclizine HCl 25 MG Chew TAKE 1 TABLET THREE TIMES DAILY AS NEEDED FOR DIZZINESS   meloxicam 15 MG tablet Commonly known as: MOBIC Take 1 tablet (15 mg total) by mouth daily.   metoprolol succinate 25 MG 24  hr tablet Commonly known as: TOPROL-XL TAKE ONE (1) TABLET ONCE DAILY What changed: See the new instructions.   omega-3 acid ethyl esters 1 g capsule Commonly known as: Lovaza Take 2 capsules (2 g total) by mouth 2 (two) times daily.   predniSONE 10 MG tablet Commonly known as: DELTASONE Take 1 tablet (10 mg total) by mouth 2 (two) times daily with a meal.   solifenacin 10 MG tablet Commonly known as: VESICARE Take 10 mg by mouth daily. Takes at 1000 am daily   tamsulosin 0.4 MG Caps capsule Commonly known as:  FLOMAX Take 0.8 mg by mouth at bedtime.   Trulicity 3 EC/9.5QH Sopn Generic drug: Dulaglutide Inject 3 mg as directed once a week. What changed: additional instructions   zolpidem 10 MG tablet Commonly known as: AMBIEN Take 10 mg by mouth at bedtime.        Followup:   Follow-up Information     [facility] Follow up on 08/08/2021.   Why: 9:30am for catheter removal Contact information: [address] [phone]

## 2021-08-02 NOTE — TOC Transition Note (Signed)
Transition of Care [facility]) - CM/SW Discharge Note   Patient Details  Name: Joshua Nelson MRN: [id number] Date of Birth: 1951/01/09  Transition of Care [facility]) CM/SW Contact:  Leeroy Cha, RN Phone Number: 08/02/2021, 10:45 AM   Clinical Narrative:     Transition of Care [facility]) Screening Note   Patient Details  Name: Joshua Nelson Date of Birth: 1951-01-24   Transition of Care [facility]) CM/SW Contact:    Leeroy Cha, RN Phone Number: 08/02/2021, 10:45 AM    Transition of Care Department [facility]) has reviewed patient and no TOC needs have been identified at this time. We will continue to monitor patient advancement through interdisciplinary progression rounds. If new patient transition needs arise, please place a TOC consult.     Final next level of care: Home/Self Care Barriers to Discharge: No Barriers Identified   Patient Goals and CMS Choice Patient states their goals for this hospitalization and ongoing recovery are:: to go hoime CMS Medicare.gov Compare Post Acute Care list provided to:: Patient    Discharge Placement                       Discharge Plan and Services   Discharge Planning Services: CM Consult                                 Social Determinants of Health (SDOH) Interventions     Readmission Risk Interventions No flowsheet data found.

## 2021-08-08 DIAGNOSIS — C61 Malignant neoplasm of prostate: Secondary | ICD-10-CM | POA: Insufficient documentation

## 2021-08-18 ENCOUNTER — Other Ambulatory Visit: Payer: Self-pay

## 2021-08-18 ENCOUNTER — Telehealth: Payer: Self-pay

## 2021-08-18 ENCOUNTER — Ambulatory Visit (INDEPENDENT_AMBULATORY_CARE_PROVIDER_SITE_OTHER): Payer: HMO | Admitting: Family Medicine

## 2021-08-18 ENCOUNTER — Other Ambulatory Visit: Payer: Self-pay | Admitting: Family Medicine

## 2021-08-18 VITALS — BP 124/60 | HR 80 | Temp 98.8°F | Resp 16 | Ht 69.0 in | Wt 202.0 lb

## 2021-08-18 DIAGNOSIS — I2510 Atherosclerotic heart disease of native coronary artery without angina pectoris: Secondary | ICD-10-CM

## 2021-08-18 DIAGNOSIS — E1142 Type 2 diabetes mellitus with diabetic polyneuropathy: Secondary | ICD-10-CM

## 2021-08-18 LAB — POCT UA - MICROALBUMIN: Microalbumin Ur, POC: 150 mg/L

## 2021-08-18 MED ORDER — DAPAGLIFLOZIN PROPANEDIOL 10 MG PO TABS: 10.0000 mg | ORAL_TABLET | Freq: Every day | ORAL | 1 refills | Status: DC

## 2021-08-18 MED ORDER — FREESTYLE LIBRE 2 SENSOR MISC: 1.0000 | 3 refills | Status: DC

## 2021-08-18 MED ORDER — TRULICITY 3 MG/0.5ML ~~LOC~~ SOAJ: 3.0000 mg | SUBCUTANEOUS | 1 refills | Status: DC

## 2021-08-18 MED ORDER — FIASP FLEXTOUCH 100 UNIT/ML ~~LOC~~ SOPN: 10.0000 [IU] | PEN_INJECTOR | Freq: Two times a day (BID) | SUBCUTANEOUS | 0 refills | Status: DC

## 2021-08-18 MED ORDER — DAPAGLIFLOZIN PROPANEDIOL 5 MG PO TABS: 10.0000 mg | ORAL_TABLET | Freq: Every day | ORAL | 1 refills | Status: DC

## 2021-08-18 MED ORDER — LISINOPRIL 2.5 MG PO TABS: ORAL_TABLET | ORAL | 0 refills | Status: DC

## 2021-08-18 MED ORDER — METOPROLOL SUCCINATE ER 25 MG PO TB24: ORAL_TABLET | ORAL | 1 refills | Status: DC

## 2021-08-18 MED ORDER — DAPAGLIFLOZIN PROPANEDIOL 5 MG PO TABS: 5.0000 mg | ORAL_TABLET | Freq: Every day | ORAL | 1 refills | Status: DC

## 2021-08-18 MED ORDER — LANTUS SOLOSTAR 100 UNIT/ML ~~LOC~~ SOPN: 65.0000 [IU] | PEN_INJECTOR | Freq: Every day | SUBCUTANEOUS | 1 refills | Status: DC

## 2021-08-18 MED ORDER — TRULICITY 4.5 MG/0.5ML ~~LOC~~ SOAJ: 4.5000 mg | SUBCUTANEOUS | 0 refills | Status: DC

## 2021-08-18 NOTE — Progress Notes (Signed)
° ° °  Chronic Care Management Pharmacy Assistant   Name: Joshua Nelson  MRN: [id number] DOB: 01-26-1951   Reason for Encounter: Reminder call for appointment 08/24/2021 at  8:30 am.  Left message   [place] Pharmacist Assistant [phone]

## 2021-08-18 NOTE — Progress Notes (Signed)
Apnea.  Subjective:  Patient ID: Joshua Nelson, male    DOB: 17-Nov-1950  Age: 71 y.o. MRN: [id number]  Chief Complaint  Patient presents with   Diabetes   Hyperlipidemia   Hypertension    HPI Diabetes complicated by neuropathy: not checking sugars. Has not had free style libre sensors.  Patient is currently on Trulicity 3 mg weekly, Lantus 65 units daily, fark [employer] 10 mg daily and mealtime insulin (Fiasp) 10 units before breakfast and supper.  Patient checks his feet.  Patient eats what he wants.  He does not exercise.  Patient takes gabapentin 300 mg 3 times daily.  Hyperlipidemia: Currently on Lipitor 80 mg once daily, Lovaza 1 g 2 capsules twice daily and Zetia 10 mg once daily.  Hypertension: Currently on lisinopril 5 mg once daily and Toprol-XL 25 mg once daily.  BPH with LUTS: BP TURP in 08/02/2021.  Currently on Flomax 0.4 mg nightly and Vesicare 10 mg daily.  Patient is followed by urology  Current Outpatient Medications on File Prior to Visit  Medication Sig Dispense Refill   atorvastatin (LIPITOR) 80 MG tablet TAKE ONE (1) TABLET ONCE DAILY 90 tablet 0   Continuous Blood Gluc Receiver (FREESTYLE LIBRE 2 READER) DEVI Check qac and qhs 1 each 0   cyclobenzaprine (FLEXERIL) 5 MG tablet Take 1 tablet (5 mg total) by mouth 3 (three) times daily as needed for muscle spasms. 30 tablet 1   ezetimibe (ZETIA) 10 MG tablet TAKE ONE (1) TABLET ONCE DAILY 90 tablet 1   HYDROcodone-acetaminophen (NORCO/VICODIN) 5-325 MG tablet Take 1 tablet by mouth every 6 (six) hours as needed for moderate pain. 20 tablet 0   Meclizine HCl 25 MG CHEW TAKE 1 TABLET THREE TIMES DAILY AS NEEDED FOR DIZZINESS 30 tablet 0   omega-3 acid ethyl esters (LOVAZA) 1 g capsule Take 2 capsules (2 g total) by mouth 2 (two) times daily. 360 capsule 0   solifenacin (VESICARE) 10 MG tablet Take 10 mg by mouth daily. Takes at 1000 am daily     tamsulosin (FLOMAX) 0.4 MG CAPS capsule Take 0.8 mg by mouth at bedtime.      zolpidem (AMBIEN) 10 MG tablet Take 10 mg by mouth at bedtime.      No current facility-administered medications on file prior to visit.   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   PONV (postoperative nausea and vomiting)    Past Surgical History:  Procedure Laterality Date   CYSTOSCOPY WITH URETHRAL DILATATION N/A 08/01/2021   Procedure: CYSTOSCOPY WITH BALLOON URETHRAL DILATATION;  Surgeon: Robley Fries, MD;  Location: [facility];  Service: Urology;  Laterality: N/A;   heart stents     x 3   left cataract  10/2019   nose surgery for dog bite as a kid      right cataract Right 08/2019   TRANSURETHRAL RESECTION OF BLADDER TUMOR N/A 08/01/2021   Procedure: TRANSURETHRAL RESECTION OF PROSTATE;  Surgeon: Robley Fries, MD;  Location: [facility];  Service: Urology;  Laterality: N/A;  71 MINS    Family History  Problem Relation Age of Onset   Endometrial cancer Mother  CAD Mother    AAA (abdominal aortic aneurysm) Mother    Healthy Father    Heart attack Brother    Social History   Socioeconomic History   Marital status: Married    Spouse name: Not on file   Number of children: 1   Years of education: 12   Highest education level: High school graduate  Occupational History   Occupation: Retired  Tobacco Use   Smoking status: Former    Types: Cigarettes    Quit date: 2003    Years since quitting: 20.0   Smokeless tobacco: Never  Vaping Use   Vaping Use: Never used  Substance and Sexual Activity   Alcohol use: Not Currently    Comment: none since 2000   Drug use: Never   Sexual activity: Not on file  Other Topics Concern   Not on file  Social History Narrative    Lives at home with his wife.   2 cups caffeine per day.   Right-handed.   Social Determinants of Health   Financial Resource Strain: High Risk   Difficulty of Paying Living Expenses: Hard  Food Insecurity: Not on file  Transportation Needs: No Transportation Needs   Lack of Transportation (Medical): No   Lack of Transportation (Non-Medical): No  Physical Activity: Not on file  Stress: Not on file  Social Connections: Not on file    Review of Systems  Constitutional:  Negative for chills and fever.  HENT:  Negative for congestion, rhinorrhea and sore throat.   Respiratory:  Negative for cough and shortness of breath.   Cardiovascular:  Negative for chest pain and palpitations.  Gastrointestinal:  Negative for abdominal pain, constipation, diarrhea, nausea and vomiting.  Endocrine: Negative for polydipsia, polyphagia and polyuria.  Genitourinary:  Positive for frequency. Negative for dysuria and urgency.  Musculoskeletal:  Negative for arthralgias, back pain and myalgias.  Neurological:  Negative for dizziness and headaches.  Psychiatric/Behavioral:  Negative for dysphoric mood. The patient is not nervous/anxious.     Objective:  BP 124/60    Pulse 80    Temp 98.8 F (37.1 C)    Resp 16    Ht 5\' 9"  (1.753 m)    Wt 202 lb (91.6 kg)    BMI 29.83 kg/m   BP/Weight 08/18/2021 08/02/2021 41/93/7902  Systolic BP 409 735 -  Diastolic BP 60 66 -  Wt. (Lbs) 202 - 184  BMI 29.83 - 27.17    Physical Exam Vitals reviewed.  Constitutional:      Appearance: Normal appearance.  Neck:     Vascular: No carotid bruit.  Cardiovascular:     Rate and Rhythm: Normal rate and regular rhythm.     Pulses: Normal pulses.     Heart sounds: Normal heart sounds.  Pulmonary:     Effort: Pulmonary effort is normal.     Breath sounds: Normal breath sounds. No wheezing, rhonchi or rales.  Abdominal:     General: Bowel sounds are normal.     Palpations: Abdomen is soft.     Tenderness: There is no  abdominal tenderness.  Neurological:     Mental Status: He is alert.  Psychiatric:        Mood and Affect: Mood normal.        Behavior: Behavior normal.    Diabetic Foot Exam - Simple   Simple Foot Form Diabetic Foot exam was performed with the following findings: Yes 08/18/2021  9:50 AM  Visual Inspection See comments: Yes Sensation  Testing Intact to touch and monofilament testing bilaterally: Yes Pulse Check Posterior Tibialis and Dorsalis pulse intact bilaterally: Yes Comments Calluses on feet.       Lab Results  Component Value Date   WBC 11.0 (H) 07/26/2021   HGB 13.6 07/26/2021   HCT 41.5 07/26/2021   PLT 185 07/26/2021   GLUCOSE 168 (H) 07/26/2021   CHOL 199 08/18/2021   TRIG 159 (H) 08/18/2021   HDL 27 (L) 08/18/2021   LDLCALC 143 (H) 08/18/2021   ALT 16 04/25/2021   AST 17 04/25/2021   NA 136 07/26/2021   K 4.3 07/26/2021   CL 103 07/26/2021   CREATININE 0.87 07/26/2021   BUN 16 07/26/2021   CO2 24 07/26/2021   TSH 0.687 06/28/2020   HGBA1C 8.7 (H) 07/26/2021   MICROALBUR 150 08/18/2021      Assessment & Plan:   Problem List Items Addressed This Visit       Cardiovascular and Mediastinum   Atherosclerosis of native coronary artery of native heart without angina pectoris - Primary    Continue metoprolol.  Continue statin       Relevant Medications   metoprolol succinate (TOPROL-XL) 25 MG 24 hr tablet   Other Relevant Orders   Lipid panel (Completed)     Endocrine   Diabetic polyneuropathy (HCC)    Increase Trulicity to 4.5 mg weekly.  Continue other diabetes medicines as prescribed.      Relevant Medications   LANTUS SOLOSTAR 100 UNIT/ML Solostar Pen   Dulaglutide (TRULICITY) 4.5 HG/9.9ME SOPN   Other Relevant Orders   POCT UA - Microalbumin (Completed)  .  Meds ordered this encounter  Medications   metoprolol succinate (TOPROL-XL) 25 MG 24 hr tablet    Sig: TAKE ONE (1) TABLET ONCE DAILY takes in am    Dispense:  90 tablet     Refill:  1   DISCONTD: lisinopril (ZESTRIL) 2.5 MG tablet    Sig: Once daily in the am    Dispense:  90 tablet    Refill:  0    Cancel lisinopril 10 mg once daily rx.   DISCONTD: insulin aspart (FIASP FLEXTOUCH) 100 UNIT/ML FlexTouch Pen    Sig: Inject 10 Units into the skin in the morning and at bedtime. Pt covers with Sliding Scale in am at breakfast and in the pm at supper    Dispense:  18 mL    Refill:  0   DISCONTD: Dulaglutide (TRULICITY) 3 QA/8.3MH SOPN    Sig: Inject 3 mg as directed once a week. Pt takes on Tuesdays    Dispense:  6 mL    Refill:  1   DISCONTD: dapagliflozin propanediol (FARXIGA) 5 MG TABS tablet    Sig: Take 1 tablet (5 mg total) by mouth daily. Pt takes in the am    Dispense:  90 tablet    Refill:  1   LANTUS SOLOSTAR 100 UNIT/ML Solostar Pen    Sig: Inject 65 Units into the skin daily. Pt takes in the pm per wife    Dispense:  30 mL    Refill:  1   Dulaglutide (TRULICITY) 4.5 DQ/2.2WL SOPN    Sig: Inject 4.5 mg as directed once a week.    Dispense:  6 mL    Refill:  0   DISCONTD: Continuous Blood Gluc Sensor (FREESTYLE LIBRE 2 SENSOR) MISC    Sig: 1 each by Does not apply route every 14 (fourteen) days.    Dispense:  6  each    Refill:  3   DISCONTD: dapagliflozin propanediol (FARXIGA) 5 MG TABS tablet    Sig: Take 2 tablets (10 mg total) by mouth daily. Pt takes in the am    Dispense:  90 tablet    Refill:  1   DISCONTD: dapagliflozin propanediol (FARXIGA) 10 MG TABS tablet    Sig: Take 1 tablet (10 mg total) by mouth daily. Pt takes in the am    Dispense:  90 tablet    Refill:  1    Orders Placed This Encounter  Procedures   Lipid panel   POCT UA - Microalbumin     Follow-up: Return in about 3 months (around 11/16/2021) for chronic fasting.  An After Visit Summary was printed and given to the patient.  [name], MD [facility] [phone]

## 2021-08-18 NOTE — Patient Instructions (Addendum)
Increase trulicity to 4.5 mg weekly.   Sent sensors to new pharmacy, [facility].  Phone number: [phone].  They may call you

## 2021-08-19 LAB — LIPID PANEL
Chol/HDL Ratio: 7.4 ratio — ABNORMAL HIGH (ref 0.0–5.0)
Cholesterol, Total: 199 mg/dL (ref 100–199)
HDL: 27 mg/dL — ABNORMAL LOW (ref >39)
LDL Chol Calc (NIH): 143 mg/dL — ABNORMAL HIGH (ref 0–99)
Triglycerides: 159 mg/dL — ABNORMAL HIGH (ref 0–149)
VLDL Cholesterol Cal: 29 mg/dL (ref 5–40)

## 2021-08-20 ENCOUNTER — Other Ambulatory Visit: Payer: Self-pay | Admitting: Family Medicine

## 2021-08-20 MED ORDER — FREESTYLE LIBRE 2 SENSOR MISC: 1.0000 | 3 refills | Status: DC

## 2021-08-21 ENCOUNTER — Other Ambulatory Visit: Payer: Self-pay | Admitting: Family Medicine

## 2021-08-21 ENCOUNTER — Other Ambulatory Visit: Payer: Self-pay

## 2021-08-21 ENCOUNTER — Telehealth: Payer: Self-pay

## 2021-08-21 MED ORDER — DAPAGLIFLOZIN PROPANEDIOL 10 MG PO TABS: 10.0000 mg | ORAL_TABLET | Freq: Every day | ORAL | 1 refills | Status: DC

## 2021-08-21 MED ORDER — LISINOPRIL 5 MG PO TABS: ORAL_TABLET | ORAL | 0 refills | Status: DC

## 2021-08-21 MED ORDER — GABAPENTIN 300 MG PO CAPS: ORAL_CAPSULE | ORAL | 1 refills | Status: DC

## 2021-08-21 MED ORDER — FIASP FLEXTOUCH 100 UNIT/ML ~~LOC~~ SOPN: PEN_INJECTOR | SUBCUTANEOUS | 0 refills | Status: DC

## 2021-08-21 NOTE — Telephone Encounter (Signed)
[facility] about it is ok to change to Trulicity 3.0mg /ml due to back order. Also Dr. Tobie Poet has resent Fiasp with Maximum MLS per day.

## 2021-08-21 NOTE — Telephone Encounter (Signed)
Kayla from [facility] called with questions to clarify the Trulicity.  They received 4.5 mg and 3.0 mg.  They do not have 4.5 mg on hand since it is on Manufacturer back order.  They also need the maximum dose of insulin for 24 hours.

## 2021-08-24 ENCOUNTER — Other Ambulatory Visit: Payer: Self-pay

## 2021-08-24 ENCOUNTER — Ambulatory Visit (INDEPENDENT_AMBULATORY_CARE_PROVIDER_SITE_OTHER): Payer: HMO

## 2021-08-24 DIAGNOSIS — E1142 Type 2 diabetes mellitus with diabetic polyneuropathy: Secondary | ICD-10-CM

## 2021-08-24 DIAGNOSIS — E118 Type 2 diabetes mellitus with unspecified complications: Secondary | ICD-10-CM

## 2021-08-24 DIAGNOSIS — E782 Mixed hyperlipidemia: Secondary | ICD-10-CM

## 2021-08-24 DIAGNOSIS — I119 Hypertensive heart disease without heart failure: Secondary | ICD-10-CM

## 2021-08-24 NOTE — Progress Notes (Signed)
Chronic Care Management Pharmacy Note  08/24/2021 Name:  Joshua Nelson MRN:  [id number] DOB:  1951-04-08   Recommendations/Changes made from today's visit: -Focused on getting [place] sent to patient. [facility] and they don't have script. Called [facility] and wasn't able to talk with them, gave wife phone number to call and get status on Libre. CPP F/U 1 week   Subjective: Joshua Nelson is an 71 y.o. year old male who is a primary patient of Cox, Kirsten, MD.  The CCM team was consulted for assistance with disease management and care coordination needs.    Engaged with patient by telephone for follow up visit in response to provider referral for pharmacy case management and/or care coordination services.   Consent to Services:  The patient was given the following information about Chronic Care Management services today, agreed to services, and gave verbal consent: 1. CCM service includes personalized support from designated clinical staff supervised by the primary care provider, including individualized plan of care and coordination with other care providers 2. 24/7 contact phone numbers for assistance for urgent and routine care needs. 3. Service will only be billed when office clinical staff spend 20 minutes or more in a month to coordinate care. 4. Only one practitioner may furnish and bill the service in a calendar month. 5.The patient may stop CCM services at any time (effective at the end of the month) by phone call to the office staff. 6. The patient will be responsible for cost sharing (co-pay) of up to 20% of the service fee (after annual deductible is met). Patient agreed to services and consent obtained.  Patient Care Team: [name], MD as PCP - General (Family Medicine) Lane Hacker, [facility] (Pharmacist)  Recent office visits:  07-18-2021 Lillard Anes, MD. Preop exam.   07-12-2021 Rip Harbour, NP. STOP keflex. START prednisone 10 mg twice daily. Flexeril 5  mg 3 times daily, Ibuprofen 800 mg every 8 hours as needed, Prednisone 10 mg twice daily.   06-14-2021 Lillard Anes, MD. Follow up for gout involving toe on left foot. Recent consult visits:  07-27-2021 Robley Fries, MD. Preop testing appointment.   06-19-2021 Evelina Bucy, DPM (Podiatry). "Casted for DM shoes today and nail Debridement.   Hospital visits:  None in previous 6 months   Objective:  Lab Results  Component Value Date   CREATININE 0.87 07/26/2021   BUN 16 07/26/2021   GFRNONAA >60 07/26/2021   GFRAA 105 06/28/2020   NA 136 07/26/2021   K 4.3 07/26/2021   CALCIUM 9.1 07/26/2021   CO2 24 07/26/2021   GLUCOSE 168 (H) 07/26/2021    Lab Results  Component Value Date/Time   HGBA1C 8.7 (H) 07/26/2021 11:30 AM   HGBA1C 8.1 (H) 04/25/2021 09:45 AM   MICROALBUR 150 08/18/2021 08:47 AM   MICROALBUR 10 10/17/2020 09:50 AM    Last diabetic Eye exam:  Lab Results  Component Value Date/Time   HMDIABEYEEXA No Retinopathy 09/25/2019 12:00 AM    Last diabetic Foot exam: No results found for: HMDIABFOOTEX   Lab Results  Component Value Date   CHOL 199 08/18/2021   HDL 27 (L) 08/18/2021   LDLCALC 143 (H) 08/18/2021   TRIG 159 (H) 08/18/2021   CHOLHDL 7.4 (H) 08/18/2021    Hepatic Function Latest Ref Rng & Units 04/25/2021 01/18/2021 10/17/2020  Total Protein 6.0 - 8.5 g/dL 6.7 6.8 7.0  Albumin 3.8 - 4.8 g/dL 4.5 4.5 4.5  AST  0 - 40 IU/L $Remov'17 17 18  'fOCtVB$ ALT 0 - 44 IU/L $Remov'16 18 20  'ZHGmkD$ Alk Phosphatase 44 - 121 IU/L 61 63 69  Total Bilirubin 0.0 - 1.2 mg/dL 0.8 0.6 0.6    Lab Results  Component Value Date/Time   TSH 0.687 06/28/2020 09:30 AM    CBC Latest Ref Rng & Units 07/26/2021 04/25/2021 01/18/2021  WBC 4.0 - 10.5 K/uL 11.0(H) 8.8 8.2  Hemoglobin 13.0 - 17.0 g/dL 13.6 12.9(L) 12.6(L)  Hematocrit 39.0 - 52.0 % 41.5 38.9 38.1  Platelets 150 - 400 K/uL 185 193 163    No results found for: VD25OH  Clinical ASCVD: Yes  The ASCVD Risk score (Arnett DK, et  al., 2019) failed to calculate for the following reasons:   The patient has a prior MI or stroke diagnosis    Depression screen [facility] 2/9 08/18/2021 04/25/2021 01/18/2021  Decreased Interest 0 0 0  Down, Depressed, Hopeless 0 0 0  PHQ - 2 Score 0 0 0  Altered sleeping - - -  Tired, decreased energy - - -  Change in appetite - - -  Feeling bad or failure about yourself  - - -  Trouble concentrating - - -  Moving slowly or fidgety/restless - - -  Suicidal thoughts - - -  PHQ-9 Score - - -  Difficult doing work/chores - - -     Other: (CHADS2VASc if Afib, MMRC or CAT for COPD, ACT, DEXA)  Social History   Tobacco Use  Smoking Status Former   Types: Cigarettes   Quit date: 2003   Years since quitting: 20.0  Smokeless Tobacco Never   BP Readings from Last 3 Encounters:  08/18/21 124/60  08/02/21 107/66  07/26/21 (!) 151/89   Pulse Readings from Last 3 Encounters:  08/18/21 80  08/02/21 71  07/26/21 75   Wt Readings from Last 3 Encounters:  08/18/21 202 lb (91.6 kg)  08/01/21 184 lb (83.5 kg)  07/26/21 184 lb (83.5 kg)   BMI Readings from Last 3 Encounters:  08/18/21 29.83 kg/m  08/01/21 27.17 kg/m  07/26/21 27.17 kg/m    Assessment/Interventions: Review of patient past medical history, allergies, medications, health status, including review of consultants reports, laboratory and other test data, was performed as part of comprehensive evaluation and provision of chronic care management services.   SDOH:  (Social Determinants of Health) assessments and interventions performed: Yes  SDOH Screenings   Alcohol Screen: Not on file  Depression (PHQ2-9): Low Risk    PHQ-2 Score: 0  Financial Resource Strain: Not on file  Food Insecurity: Not on file  Housing: Not on file  Physical Activity: Not on file  Social Connections: Not on file  Stress: Not on file  Tobacco Use: Medium Risk   Smoking Tobacco Use: Former   Smokeless Tobacco Use: Never   Passive Exposure: Not  on file  Transportation Needs: Not on file    [place]  No Known Allergies  Medications Reviewed Today     Reviewed by Sherril Cong, RN (Registered Nurse) on 08/01/21 at 1248  Med List Status: Complete   Medication Order Taking? Sig Documenting Provider Last Dose Status Informant  atorvastatin (LIPITOR) 80 MG tablet [id number] Yes TAKE ONE (1) TABLET ONCE DAILY Lillard Anes, MD 07/30/2021 Active Spouse/Significant Other  Colchicine (MITIGARE) 0.6 MG CAPS [id number] No Take 1 capsule by mouth 2 (two) times daily for 5 days.  Patient not taking: Reported on 07/21/2021   Reinaldo Meeker  Percell Miller, MD Not Taking Active   Continuous Blood Gluc Receiver (FREESTYLE LIBRE 2 READER) DEVI [id number]  Check qac and qhs Cox, Kirsten, MD  Active Spouse/Significant Other  Continuous Blood Gluc Sensor (FREESTYLE LIBRE 2 SENSOR) Patrick [id number]  1 each by Does not apply route every 14 (fourteen) days. Cox, Kirsten, MD  Active Spouse/Significant Other  cyclobenzaprine (FLEXERIL) 5 MG tablet [id number] Yes Take 1 tablet (5 mg total) by mouth 3 (three) times daily as needed for muscle spasms. Rip Harbour, NP Past Month Active Spouse/Significant Other  dapagliflozin propanediol (FARXIGA) 5 MG TABS tablet [id number] Yes Take 1 tablet (5 mg total) by mouth daily.  Patient taking differently: Take 5 mg by mouth daily. Pt takes in the am   [name], MD 07/31/2021 Active Spouse/Significant Other  Dulaglutide (TRULICITY) 3 LM/7.8ML SOPN [id number] Yes Inject 3 mg as directed once a week.  Patient taking differently: Inject 3 mg as directed once a week. Pt takes on Tuesdays   Cox, Kirsten, MD 07/25/2021 Active Spouse/Significant Other  ezetimibe (ZETIA) 10 MG tablet [id number] Yes TAKE ONE (1) TABLET ONCE DAILY Cox, Kirsten, MD 07/31/2021 Active Spouse/Significant Other  gabapentin (NEURONTIN) 300 MG capsule [id number] Yes TAKE ONE (1) CAPSULE THREE (3) TIMES EACH DAY Cox, Kirsten, MD Past Month  Active Spouse/Significant Other  ibuprofen (ADVIL) 800 MG tablet [id number] Yes Take 1 tablet (800 mg total) by mouth every 8 (eight) hours as needed for moderate pain. Rip Harbour, NP Past Week Active Spouse/Significant Other  insulin aspart (FIASP FLEXTOUCH) 100 UNIT/ML FlexTouch Pen [id number] Yes Dose AT THE TIME OF MEAL as follows: 100-150 = 4 U, 151-200 = 8 U, 201-250 = 16 U, 251-300 = 20 U, Greater than 300 = 24 U.  Patient taking differently: Inject 10 Units into the skin in the morning and at bedtime. Pt covers with Sliding Scale in am at breakfast and in the pm at supper   [name], MD 07/30/2021 Active Spouse/Significant Other  LANTUS SOLOSTAR 100 UNIT/ML Solostar Pen [id number] Yes Inject 65 Units into the skin daily.  Patient taking differently: Inject 65 Units into the skin daily. Pt takes in the pm per wife   [name], MD 07/30/2021 Active Spouse/Significant Other  lisinopril (ZESTRIL) 2.5 MG tablet [id number] Yes Once daily  Patient taking differently: Once daily in the am   [name], MD 07/31/2021 Active Spouse/Significant Other  Meclizine HCl 25 MG CHEW [id number] Yes TAKE 1 TABLET THREE TIMES DAILY AS NEEDED FOR Joshua Nelson, Kirsten, MD Past Month Active Spouse/Significant Other  meloxicam (MOBIC) 15 MG tablet [id number] Yes Take 1 tablet (15 mg total) by mouth daily. Cox, Kirsten, MD Past Month Active Spouse/Significant Other  metoprolol succinate (TOPROL-XL) 25 MG 24 hr tablet [id number] Yes TAKE ONE (1) TABLET ONCE DAILY  Patient taking differently: TAKE ONE (1) TABLET ONCE DAILY takes in am   [name], MD 07/31/2021 1000 Active Spouse/Significant Other  omega-3 acid ethyl esters (LOVAZA) 1 g capsule [id number] No Take 2 capsules (2 g total) by mouth 2 (two) times daily. Cox, Kirsten, MD Unknown Active Spouse/Significant Other  predniSONE (DELTASONE) 10 MG tablet [id number] No Take 1 tablet (10 mg total) by mouth 2 (two) times daily with a meal.  Patient not  taking: Reported on 07/21/2021   Rip Harbour, NP Not Taking Active Spouse/Significant Other  solifenacin (VESICARE) 10 MG tablet [id number] Yes Take 10 mg by mouth daily. Takes at 1000 am daily [provider] Past Month Active Spouse/Significant Other  tamsulosin (FLOMAX) 0.4 MG CAPS capsule [id number] Yes Take 0.8 mg by mouth at bedtime. [provider] Past Month Active Spouse/Significant Other  zolpidem (AMBIEN) 10 MG tablet [id number] No Take 10 mg by mouth at bedtime.  [provider] More than a month Active Spouse/Significant Other            Patient Active Problem List   Diagnosis Date Noted   BPH with obstruction/lower urinary tract symptoms 08/01/2021   Ulcer of left foot ([place]) 06/14/2021   Preoperative general physical examination 05/01/2021   Mixed hyperlipidemia 11/30/2019   Diabetic polyneuropathy ([place]) 11/30/2019   Hypertensive heart disease without heart failure 11/30/2019   Chronic left shoulder pain 11/30/2019   Obstructive sleep apnea 04/03/2018   Atherosclerosis of native coronary artery of native heart without angina pectoris 03/28/2018   Old myocardial infarction 03/28/2018    Immunization History  Administered Date(s) Administered   Fluad Quad(high Dose 65+) 06/28/2020, 04/25/2021   PFIZER(Purple Top)SARS-COV-2 Vaccination 12/10/2019, 01/08/2020   PNEUMOCOCCAL CONJUGATE-20 05/26/2021   Pneumococcal Polysaccharide-23 06/28/2020    Conditions to be addressed/monitored:  Hypertension, Hyperlipidemia, and Diabetes  Care Plan : [place]  Updates made by Lane Hacker, RPH since 08/24/2021 12:00 AM     Problem: DM, Lipids, Pain, HTN   Priority: High  Onset Date: 08/24/2021     Long-Range Goal: Disease State Management   Start Date: 08/24/2021  Expected End Date: 08/24/2022  This Visit's Progress: On track  Priority: High  Note:   Current Barriers:  Does not contact provider office for  questions/concerns  Pharmacist Clinical Goal(s):  Patient will contact provider office for questions/concerns as evidenced notation of same in electronic health record through collaboration with PharmD and provider.   Interventions: 1:1 collaboration with Cox, Elnita Maxwell, MD regarding development and update of comprehensive plan of care as evidenced by provider attestation and co-signature Inter-disciplinary care team collaboration (see longitudinal plan of care) Comprehensive medication review performed; medication list updated in electronic medical record  Hypertension (BP goal <140/90) BP Readings from Last 3 Encounters:  08/18/21 124/60  08/02/21 107/66  07/26/21 (!) 151/89  -Controlled -Current treatment: Lisinopril 5mg  QD Appropriate, Effective, Safe, Accessible Metoprolol Succ 25mg  QD Appropriate, Effective, Safe, Accessible -Medications previously tried: N/A  -Current home readings: Doesn't test -Current dietary habits: "Tries to eat healthy" -Current exercise habits: None -Denies hypotensive/hypertensive symptoms -Educated on BP goals and benefits of medications for prevention of heart attack, stroke and kidney damage; -Counseled to monitor BP at home PRN, document, and provide log at future appointments -Recommended to continue current medication  Hyperlipidemia: (LDL goal < 70) The ASCVD Risk score (Arnett DK, et al., 2019) failed to calculate for the following reasons:   The patient has a prior MI or stroke diagnosis Lab Results  Component Value Date   CHOL 199 08/18/2021   CHOL 140 04/25/2021   CHOL 146 01/18/2021   Lab Results  Component Value Date   HDL 27 (L) 08/18/2021   HDL 26 (L) 04/25/2021   HDL 30 (L) 01/18/2021   Lab Results  Component Value Date   LDLCALC 143 (H) 08/18/2021   LDLCALC 93 04/25/2021   LDLCALC 95 01/18/2021   Lab Results  Component Value Date   TRIG 159 (H) 08/18/2021   TRIG 113 04/25/2021   TRIG 113 01/18/2021   Lab Results   Component Value Date   CHOLHDL 7.4 (H) 08/18/2021   CHOLHDL 5.4 (H) 04/25/2021   CHOLHDL 4.9 01/18/2021  No  results found for: LDLDIRECT -Uncontrolled -Current treatment: Atorvastatin 80mg  Appropriate, Query effective, Query Safe, Accessible -Medications previously tried: N/A  -Current dietary patterns: "Tries to eat healthy" -Current exercise habits: None -Educated on Cholesterol goals;  Jan 2023: Main priority was Elenor Legato today, will talk about statin compliance next week  Diabetes (A1c goal <8%) Lab Results  Component Value Date   HGBA1C 8.7 (H) 07/26/2021   HGBA1C 8.1 (H) 04/25/2021   HGBA1C 8.4 (H) 01/18/2021   Lab Results  Component Value Date   MICROALBUR 150 08/18/2021   LDLCALC 143 (H) 08/18/2021   CREATININE 0.87 07/26/2021   Lab Results  Component Value Date   NA 136 07/26/2021   K 4.3 07/26/2021   CREATININE 0.87 07/26/2021   EGFR 95 04/25/2021   GFRNONAA >60 07/26/2021   GLUCOSE 168 (H) 07/26/2021   Lab Results  Component Value Date   WBC 11.0 (H) 07/26/2021   HGB 13.6 07/26/2021   HCT 41.5 07/26/2021   MCV 88.1 07/26/2021   PLT 185 07/26/2021  -Uncontrolled -Current medications: Libre Appropriate, Query effective, Safe, Query accessible Dapagliflozin 10mg  Appropriate, Query effective, Safe, Query accessible Trulicity 4.5mg /week Appropriate, Query effective, Safe, Query accessible [facility] SS Appropriate, Query effective, Safe, Query accessible -Medications previously tried: None  -Current home glucose readings fasting glucose:  Jan 2023: Doesn't test, doesn't have meter/Libre post prandial glucose:  -Denies hypoglycemic/hyperglycemic symptoms -Current exercise: None -Educated on A1c and blood sugar goals; -Counseled to check feet daily and get yearly eye exams Jan 2023: Main priority was getting Libre. Spoke with wife, will call  [phone], [facility], to see status of Elenor Legato. Will f/u next week to hear what next step is. Patient unable to get  Trulicity 4.5mg /week, had to get 3.0mg  pen from Rx  Chronic Pain -Controlled -Current treatment: Gabapentin 300mg  QD Query Appropriate, Query effective, Safe, Accessible Hydrocodone/APAP 5/325 Query Appropriate, Query effective, Safe, Accessible IBU 800mg  TID PRN Appropriate, Effective, Safe, Accessible -Medications previously tried: None  -Pain Scale There were no vitals filed for this visit.  Aggravating Factors: Movement  Pain Type: Patient didn't specify  Jan 2023: Main priority was Elenor Legato, will go over pain at f/u   Insomnia (Goal: 7-8 hours of sleep/night) -Not ideally controlled -Patient currently getting unknown hours of sleep/night -Patient currently waking up unknown times/night -Current treatment: Zolpidem 10mg  HS Query Appropriate, Query effective, Query Safe, Query accessible -Medications previously tried: N/A -Counseled on sleep hygiene techniques (consistent sleep/wake up schedule, no electronic screens 1-2 hours before bed, etc) Jan 2023: Main priority was Elenor Legato, will go over sleep at f/u   BPH (Goal: improve urination) -Not ideally controlled -Night time urination frequency: unknown/night -Current treatment: Tamsulosin 0.8mg  HS Appropriate, Query effective, Safe, Accessible -Medications previously tried: None -Counseled on non-pharmacologic techniques such as Kegels Jan 2023: Main priority was Elenor Legato, will go over BPHat f/u  Patient Goals/Self-Care Activities Patient will:  - take medications as prescribed as evidenced by patient report and record review  Follow Up Plan: The patient has been provided with contact information for the care management team and has been advised to call with any health related questions or concerns.   CPP F/U 1 week   [place] Constable, Pharm.D. - [phone]        Medication Assistance: None required.  Patient affirms current coverage meets needs.  Compliance/Adherence/Medication fill history: Care Gaps: Last eye exam  / Retinopathy Screening? None Next Annual Wellness Visit? 08-03-2021 Last Diabetic Foot Exam? 06-19-2021 Tdap overdue Shingrix Colonoscopy overdue Yearly ophthalmology overdue  Star Rating Drugs: Lisinopril 2.5 mg- Last filled 05-16-2021 90 DS Farxiga 5 mg- Last filled [id number] DS Trulicity 3 mg- Last filled 07-17-2021 28 DS Atorvastatin 80 mg- Last filled 07-18-2021 90 DS  Patient's preferred pharmacy is:  [place], [place] - [address] [place] [address] Phone: [phone] Fax: [phone]  [facility] ([place]) - [place], [place] - [address] [place] [address] Phone: [phone] Fax: [phone]  Uses pill box? No -   Pt endorses 100% compliance  We discussed: Benefits of medication synchronization, packaging and delivery as well as enhanced pharmacist oversight with [employer]. Patient decided to: Continue current medication management strategy  Care Plan and Follow Up Patient Decision:  Patient agrees to Care Plan and Follow-up.  Plan: The patient has been provided with contact information for the care management team and has been advised to call with any health related questions or concerns.   CPP F/U 1 week  [place] Constable, Pharm.D. - [phone]

## 2021-08-24 NOTE — Patient Instructions (Signed)
Visit Information   Goals Addressed   None    Patient Care Plan: CCM Pharmacy Care Plan     Problem Identified: DM, Lipids, Pain, HTN   Priority: High  Onset Date: 08/24/2021     Long-Range Goal: Disease State Management   Start Date: 08/24/2021  Expected End Date: 08/24/2022  This Visit's Progress: On track  Priority: High  Note:   Current Barriers:  Does not contact provider office for questions/concerns  Pharmacist Clinical Goal(s):  Patient will contact provider office for questions/concerns as evidenced notation of same in electronic health record through collaboration with PharmD and provider.   Interventions: 1:1 collaboration with [facility], Elnita Maxwell, MD regarding development and update of comprehensive plan of care as evidenced by provider attestation and co-signature Inter-disciplinary care team collaboration (see longitudinal plan of care) Comprehensive medication review performed; medication list updated in electronic medical record  Hypertension (BP goal <140/90) BP Readings from Last 3 Encounters:  08/18/21 124/60  08/02/21 107/66  07/26/21 (!) 151/89  -Controlled -Current treatment: Lisinopril 5mg  QD Appropriate, Effective, Safe, Accessible Metoprolol Succ 25mg  QD Appropriate, Effective, Safe, Accessible -Medications previously tried: N/A  -Current home readings: Doesn't test -Current dietary habits: "Tries to eat healthy" -Current exercise habits: None -Denies hypotensive/hypertensive symptoms -Educated on BP goals and benefits of medications for prevention of heart attack, stroke and kidney damage; -Counseled to monitor BP at home PRN, document, and provide log at future appointments -Recommended to continue current medication  Hyperlipidemia: (LDL goal < 70) The ASCVD Risk score (Arnett DK, et al., 2019) failed to calculate for the following reasons:   The patient has a prior MI or stroke diagnosis Lab Results  Component Value Date   CHOL 199 08/18/2021    CHOL 140 04/25/2021   CHOL 146 01/18/2021   Lab Results  Component Value Date   HDL 27 (L) 08/18/2021   HDL 26 (L) 04/25/2021   HDL 30 (L) 01/18/2021   Lab Results  Component Value Date   LDLCALC 143 (H) 08/18/2021   LDLCALC 93 04/25/2021   LDLCALC 95 01/18/2021   Lab Results  Component Value Date   TRIG 159 (H) 08/18/2021   TRIG 113 04/25/2021   TRIG 113 01/18/2021   Lab Results  Component Value Date   CHOLHDL 7.4 (H) 08/18/2021   CHOLHDL 5.4 (H) 04/25/2021   CHOLHDL 4.9 01/18/2021  No results found for: LDLDIRECT -Uncontrolled -Current treatment: Atorvastatin 80mg  Appropriate, Query effective, Query Safe, Accessible -Medications previously tried: N/A  -Current dietary patterns: "Tries to eat healthy" -Current exercise habits: None -Educated on Cholesterol goals;  Jan 2023: Main priority was Elenor Legato today, will talk about statin compliance next week  Diabetes (A1c goal <8%) Lab Results  Component Value Date   HGBA1C 8.7 (H) 07/26/2021   HGBA1C 8.1 (H) 04/25/2021   HGBA1C 8.4 (H) 01/18/2021   Lab Results  Component Value Date   MICROALBUR 150 08/18/2021   LDLCALC 143 (H) 08/18/2021   CREATININE 0.87 07/26/2021   Lab Results  Component Value Date   NA 136 07/26/2021   K 4.3 07/26/2021   CREATININE 0.87 07/26/2021   EGFR 95 04/25/2021   GFRNONAA >60 07/26/2021   GLUCOSE 168 (H) 07/26/2021   Lab Results  Component Value Date   WBC 11.0 (H) 07/26/2021   HGB 13.6 07/26/2021   HCT 41.5 07/26/2021   MCV 88.1 07/26/2021   PLT 185 07/26/2021  -Uncontrolled -Current medications: Libre Appropriate, Query effective, Safe, Query accessible Dapagliflozin 10mg  Appropriate, Query effective, Safe,  Query accessible Trulicity 4.5mg /week Appropriate, Query effective, Safe, Query accessible ASPART SS Appropriate, Query effective, Safe, Query accessible -Medications previously tried: None  -Current home glucose readings fasting glucose:  Jan 2023: Doesn't test,  doesn't have meter/Libre post prandial glucose:  -Denies hypoglycemic/hyperglycemic symptoms -Current exercise: None -Educated on A1c and blood sugar goals; -Counseled to check feet daily and get yearly eye exams Jan 2023: Main priority was getting [place]. Spoke with wife, will call  [phone], ASPN, to see status of Elenor Legato. Will f/u next week to hear what next step is. Patient unable to get Trulicity 4.5mg /week, had to get 3.0mg  pen from Rx  Chronic Pain -Controlled -Current treatment: Gabapentin 300mg  QD Query Appropriate, Query effective, Safe, Accessible Hydrocodone/APAP 5/325 Query Appropriate, Query effective, Safe, Accessible IBU 800mg  TID PRN Appropriate, Effective, Safe, Accessible -Medications previously tried: None  -Pain Scale There were no vitals filed for this visit.  Aggravating Factors: Movement  Pain Type: Patient didn't specify  Jan 2023: Main priority was Elenor Legato, will go over pain at f/u   Insomnia (Goal: 7-8 hours of sleep/night) -Not ideally controlled -Patient currently getting unknown hours of sleep/night -Patient currently waking up unknown times/night -Current treatment: Zolpidem 10mg  HS Query Appropriate, Query effective, Query Safe, Query accessible -Medications previously tried: N/A -Counseled on sleep hygiene techniques (consistent sleep/wake up schedule, no electronic screens 1-2 hours before bed, etc) Jan 2023: Main priority was Elenor Legato, will go over sleep at f/u   BPH (Goal: improve urination) -Not ideally controlled -Night time urination frequency: unknown/night -Current treatment: Tamsulosin 0.8mg  HS Appropriate, Query effective, Safe, Accessible -Medications previously tried: None -Counseled on non-pharmacologic techniques such as Kegels Jan 2023: Main priority was Elenor Legato, will go over BPHat f/u  Patient Goals/Self-Care Activities Patient will:  - take medications as prescribed as evidenced by patient report and record review  Follow Up  Plan: The patient has been provided with contact information for the care management team and has been advised to call with any health related questions or concerns.   CPP F/U 1 week   [place] Constable, Pharm.D. - [phone]        The patient verbalized understanding of instructions, educational materials, and care plan provided today and declined offer to receive copy of patient instructions, educational materials, and care plan.  The pharmacy team will reach out to the patient again over the next 30 days.   Lane Hacker, [facility]

## 2021-08-28 ENCOUNTER — Telehealth: Payer: Self-pay

## 2021-08-28 NOTE — Progress Notes (Signed)
08/28/21- Wife has confirmed appt with CPP on 1/20.  Joshua Nelson, [place] Pharmacist Assistant  [phone]

## 2021-08-31 ENCOUNTER — Telehealth: Payer: Self-pay

## 2021-08-31 NOTE — Telephone Encounter (Signed)
Joshua Nelson, she is aware. She has not heard from company for update of shipment. She did reach out to them and she was told they were waiting on referral. Also will be changing to dexcom.   Harrell Lark 08/31/21 4:41 PM

## 2021-08-31 NOTE — Telephone Encounter (Signed)
[name] calling for pt. They have not received freestyle in mail yet and questioning if they can pick up sample until they receive this.   Unsure if there are samples in office.   Royce Macadamia, [place] 08/31/21 2:45 PM

## 2021-09-01 ENCOUNTER — Ambulatory Visit: Payer: HMO

## 2021-09-01 NOTE — Patient Instructions (Signed)
Visit Information   Goals Addressed   None    Patient Care Plan: CCM Pharmacy Care Plan     Problem Identified: DM, Lipids, Pain, HTN   Priority: High  Onset Date: 08/24/2021     Long-Range Goal: Disease State Management   Start Date: 08/24/2021  Expected End Date: 08/24/2022  Recent Progress: On track  Priority: High  Note:   Current Barriers:  Does not contact provider office for questions/concerns  Pharmacist Clinical Goal(s):  Patient will contact provider office for questions/concerns as evidenced notation of same in electronic health record through collaboration with PharmD and provider.   Interventions: 1:1 collaboration with [facility], Elnita Maxwell, MD regarding development and update of comprehensive plan of care as evidenced by provider attestation and co-signature Inter-disciplinary care team collaboration (see longitudinal plan of care) Comprehensive medication review performed; medication list updated in electronic medical record  Hypertension (BP goal <140/90) BP Readings from Last 3 Encounters:  08/18/21 124/60  08/02/21 107/66  07/26/21 (!) 151/89  -Controlled -Current treatment: Lisinopril 56m QD Appropriate, Effective, Safe, Accessible Metoprolol Succ 231mQD Appropriate, Effective, Safe, Accessible -Medications previously tried: N/A  -Current home readings: Doesn't test -Current dietary habits: "Tries to eat healthy" -Current exercise habits: None -Denies hypotensive/hypertensive symptoms -Educated on BP goals and benefits of medications for prevention of heart attack, stroke and kidney damage; -Counseled to monitor BP at home PRN, document, and provide log at future appointments -Recommended to continue current medication  Hyperlipidemia: (LDL goal < 70) The ASCVD Risk score (Arnett DK, et al., 2019) failed to calculate for the following reasons:   The patient has a prior MI or stroke diagnosis Lab Results  Component Value Date   CHOL 199 08/18/2021   CHOL  140 04/25/2021   CHOL 146 01/18/2021   Lab Results  Component Value Date   HDL 27 (L) 08/18/2021   HDL 26 (L) 04/25/2021   HDL 30 (L) 01/18/2021   Lab Results  Component Value Date   LDLCALC 143 (H) 08/18/2021   LDLCALC 93 04/25/2021   LDLCALC 95 01/18/2021   Lab Results  Component Value Date   TRIG 159 (H) 08/18/2021   TRIG 113 04/25/2021   TRIG 113 01/18/2021   Lab Results  Component Value Date   CHOLHDL 7.4 (H) 08/18/2021   CHOLHDL 5.4 (H) 04/25/2021   CHOLHDL 4.9 01/18/2021  No results found for: LDLDIRECT -Uncontrolled -Current treatment: Atorvastatin 8066mppropriate, Query effective, Query Safe, Accessible -Medications previously tried: N/A  -Current dietary patterns: "Tries to eat healthy" -Current exercise habits: None -Educated on Cholesterol goals;  Jan 2023: Main priority was LibElenor Legatoday, will talk about statin compliance next week  Diabetes (A1c goal <8%) Lab Results  Component Value Date   HGBA1C 8.7 (H) 07/26/2021   HGBA1C 8.1 (H) 04/25/2021   HGBA1C 8.4 (H) 01/18/2021   Lab Results  Component Value Date   MICROALBUR 150 08/18/2021   LDLCALC 143 (H) 08/18/2021   CREATININE 0.87 07/26/2021   Lab Results  Component Value Date   NA 136 07/26/2021   K 4.3 07/26/2021   CREATININE 0.87 07/26/2021   EGFR 95 04/25/2021   GFRNONAA >60 07/26/2021   GLUCOSE 168 (H) 07/26/2021   Lab Results  Component Value Date   WBC 11.0 (H) 07/26/2021   HGB 13.6 07/26/2021   HCT 41.5 07/26/2021   MCV 88.1 07/26/2021   PLT 185 07/26/2021  -Uncontrolled -Current medications: Libre Appropriate, Query effective, Safe, Query accessible Dapagliflozin 21m66mpropriate, Query effective, Safe, Query  accessible Trulicity 1.3YQ/MVHQ Appropriate, Query effective, Safe, Query accessible ASPART SS Appropriate, Query effective, Safe, Query accessible -Medications previously tried: None  -Current home glucose readings fasting glucose:  Jan 2023: Doesn't test, doesn't  have meter/Libre post prandial glucose:  -Denies hypoglycemic/hyperglycemic symptoms -Current exercise: None -Educated on A1c and blood sugar goals; -Counseled to check feet daily and get yearly eye exams Jan 2023: Main priority was getting [place]. Spoke with wife, will call  [phone], ASPN, to see status of Elenor Legato. Will f/u next week to hear what next step is. Patient unable to get Trulicity 1.3KG/MWNU, had to get 3.99m pen from Rx 09/01/21: [facility] in [facility] and need a new script. Wife did not call me and has been waiting for script to be sent in. She came to office and [facility] but [facility]'t [facility] yet. Will get DEXCOMM sent ASAP  Chronic Pain -Controlled -Current treatment: Gabapentin 3054mQD Query Appropriate, Query effective, Safe, Accessible Hydrocodone/APAP 5/325 Query Appropriate, Query effective, Safe, Accessible IBU 80050mID PRN Appropriate, Effective, Safe, Accessible -Medications previously tried: None  -Pain Scale There were no vitals filed for this visit.  Aggravating Factors: Movement  Pain Type: Patient didn't specify  Jan 2023: Main priority was LibElenor Legatoill go over pain at f/u   Insomnia (Goal: 7-8 hours of sleep/night) -Not ideally controlled -Patient currently getting unknown hours of sleep/night -Patient currently waking up unknown times/night -Current treatment: Zolpidem 58m31m Query Appropriate, Query effective, Query Safe, Query accessible -Medications previously tried: N/A -Counseled on sleep hygiene techniques (consistent sleep/wake up schedule, no electronic screens 1-2 hours before bed, etc) Jan 2023: Main priority was LibrElenor Legatoll go over sleep at f/u   BPH (Goal: improve urination) -Not ideally controlled -Night time urination frequency: unknown/night -Current treatment: Tamsulosin 0.8mg 74mAppropriate, Query effective, Safe,  Accessible -Medications previously tried: None -Counseled on non-pharmacologic techniques such as Kegels Jan 2023: Main priority was LibreElenor Legatol go over BPHat f/u  Patient Goals/Self-Care Activities Patient will:  - take medications as prescribed as evidenced by patient report and record review  Follow Up Plan: The patient has been provided with contact information for the care management team and has been advised to call with any health related questions or concerns.   CPP F/U 1 week   NathaAri[place] Constablerm.D. - [phone]        The patient verbalized understanding of instructions, educational materials, and care plan provided today and declined offer to receive copy of patient instructions, educational materials, and care plan.  The pharmacy team will reach out to the patient again over the next 14 days.   NathaLane HackerJackson County Memorial Hospital

## 2021-09-01 NOTE — Progress Notes (Signed)
Chronic Care Management Pharmacy Note  09/01/2021 Name:  Joshua Nelson MRN:  [id number] DOB:  May 13, 1951   Recommendations/Changes made from today's visit: -Libre out of stock at [facility] [employer], sent msg to [employer] to get Dexomm and all sensors et al to [facility] ASAP.  -CPP F/U 1.5 weeks   Subjective: Joshua Nelson is an 71 y.o. year old male who is a primary patient of Cox, Kirsten, MD.  The CCM team was consulted for assistance with disease management and care coordination needs.    Engaged with patient by telephone for follow up visit in response to provider referral for pharmacy case management and/or care coordination services.   Consent to Services:  The patient was given the following information about Chronic Care Management services today, agreed to services, and gave verbal consent: 1. CCM service includes personalized support from designated clinical staff supervised by the primary care provider, including individualized plan of care and coordination with other care providers 2. 24/7 contact phone numbers for assistance for urgent and routine care needs. 3. Service will only be billed when office clinical staff spend 20 minutes or more in a month to coordinate care. 4. Only one practitioner may furnish and bill the service in a calendar month. 5.The patient may stop CCM services at any time (effective at the end of the month) by phone call to the office staff. 6. The patient will be responsible for cost sharing (co-pay) of up to 20% of the service fee (after annual deductible is met). Patient agreed to services and consent obtained.  Patient Care Team: [name], MD as PCP - General (Family Medicine) Lane Hacker, [facility] (Pharmacist)  Recent office visits:  07-18-2021 Lillard Anes, MD. Preop exam.   07-12-2021 Rip Harbour, NP. STOP keflex. START prednisone 10 mg twice daily. Flexeril 5 mg 3 times daily, Ibuprofen 800 mg every 8 hours as needed, Prednisone 10 mg twice  daily.   06-14-2021 Lillard Anes, MD. Follow up for gout involving toe on left foot. Recent consult visits:  07-27-2021 Robley Fries, MD. Preop testing appointment.   06-19-2021 Evelina Bucy, DPM (Podiatry). "Casted for DM shoes today and nail Debridement.   Hospital visits:  None in previous 6 months   Objective:  Lab Results  Component Value Date   CREATININE 0.87 07/26/2021   BUN 16 07/26/2021   GFRNONAA >60 07/26/2021   GFRAA 105 06/28/2020   NA 136 07/26/2021   K 4.3 07/26/2021   CALCIUM 9.1 07/26/2021   CO2 24 07/26/2021   GLUCOSE 168 (H) 07/26/2021    Lab Results  Component Value Date/Time   HGBA1C 8.7 (H) 07/26/2021 11:30 AM   HGBA1C 8.1 (H) 04/25/2021 09:45 AM   MICROALBUR 150 08/18/2021 08:47 AM   MICROALBUR 10 10/17/2020 09:50 AM    Last diabetic Eye exam:  Lab Results  Component Value Date/Time   HMDIABEYEEXA No Retinopathy 09/25/2019 12:00 AM    Last diabetic Foot exam: No results found for: HMDIABFOOTEX   Lab Results  Component Value Date   CHOL 199 08/18/2021   HDL 27 (L) 08/18/2021   LDLCALC 143 (H) 08/18/2021   TRIG 159 (H) 08/18/2021   CHOLHDL 7.4 (H) 08/18/2021    Hepatic Function Latest Ref Rng & Units 04/25/2021 01/18/2021 10/17/2020  Total Protein 6.0 - 8.5 g/dL 6.7 6.8 7.0  Albumin 3.8 - 4.8 g/dL 4.5 4.5 4.5  AST 0 - 40 IU/L $Remov'17 17 18  'UivyGH$ ALT 0 - 44  IU/L '16 18 20  '$ Alk Phosphatase 44 - 121 IU/L 61 63 69  Total Bilirubin 0.0 - 1.2 mg/dL 0.8 0.6 0.6    Lab Results  Component Value Date/Time   TSH 0.687 06/28/2020 09:30 AM    CBC Latest Ref Rng & Units 07/26/2021 04/25/2021 01/18/2021  WBC 4.0 - 10.5 K/uL 11.0(H) 8.8 8.2  Hemoglobin 13.0 - 17.0 g/dL 13.6 12.9(L) 12.6(L)  Hematocrit 39.0 - 52.0 % 41.5 38.9 38.1  Platelets 150 - 400 K/uL 185 193 163    No results found for: VD25OH  Clinical ASCVD: Yes  The ASCVD Risk score (Arnett DK, et al., 2019) failed to calculate for the following reasons:   The patient has a  prior MI or stroke diagnosis    Depression screen [facility] 2/9 08/18/2021 04/25/2021 01/18/2021  Decreased Interest 0 0 0  Down, Depressed, Hopeless 0 0 0  PHQ - 2 Score 0 0 0  Altered sleeping - - -  Tired, decreased energy - - -  Change in appetite - - -  Feeling bad or failure about yourself  - - -  Trouble concentrating - - -  Moving slowly or fidgety/restless - - -  Suicidal thoughts - - -  PHQ-9 Score - - -  Difficult doing work/chores - - -     Other: (CHADS2VASc if Afib, MMRC or CAT for COPD, ACT, DEXA)  Social History   Tobacco Use  Smoking Status Former   Types: Cigarettes   Quit date: 2003   Years since quitting: 20.0  Smokeless Tobacco Never   BP Readings from Last 3 Encounters:  08/18/21 124/60  08/02/21 107/66  07/26/21 (!) 151/89   Pulse Readings from Last 3 Encounters:  08/18/21 80  08/02/21 71  07/26/21 75   Wt Readings from Last 3 Encounters:  08/18/21 202 lb (91.6 kg)  08/01/21 184 lb (83.5 kg)  07/26/21 184 lb (83.5 kg)   BMI Readings from Last 3 Encounters:  08/18/21 29.83 kg/m  08/01/21 27.17 kg/m  07/26/21 27.17 kg/m    Assessment/Interventions: Review of patient past medical history, allergies, medications, health status, including review of consultants reports, laboratory and other test data, was performed as part of comprehensive evaluation and provision of chronic care management services.   SDOH:  (Social Determinants of Health) assessments and interventions performed: Yes SDOH Interventions    Flowsheet Row Most Recent Value  SDOH Interventions   Financial Strain Interventions Other (Comment)  [Can't afford CGM, in process of getting covered]  Transportation Interventions Intervention Not Indicated      SDOH Screenings   Alcohol Screen: Not on file  Depression (PHQ2-9): Low Risk    PHQ-2 Score: 0  Financial Resource Strain: High Risk   Difficulty of Paying Living Expenses: Hard  Food Insecurity: Not on file  Housing: Not on  file  Physical Activity: Not on file  Social Connections: Not on file  Stress: Not on file  Tobacco Use: Medium Risk   Smoking Tobacco Use: Former   Smokeless Tobacco Use: Never   Passive Exposure: Not on file  Transportation Needs: No Transportation Needs   Lack of Transportation (Medical): No   Lack of Transportation (Non-Medical): No    CCM Care Plan  No Known Allergies  Medications Reviewed Today     Reviewed by Sherril Cong, RN (Registered Nurse) on 08/01/21 at 1248  Med List Status: Complete   Medication Order Taking? Sig Documenting Provider Last Dose Status Informant  atorvastatin (LIPITOR) 80 MG  tablet [id number] Yes TAKE ONE (1) TABLET ONCE DAILY Lillard Anes, MD 07/30/2021 Active Spouse/Significant Other  Colchicine (MITIGARE) 0.6 MG CAPS [id number] No Take 1 capsule by mouth 2 (two) times daily for 5 days.  Patient not taking: Reported on 07/21/2021   Lillard Anes, MD Not Taking Active   Continuous Blood Gluc Receiver (FREESTYLE LIBRE 2 READER) DEVI [id number]  Check qac and qhs Cox, Kirsten, MD  Active Spouse/Significant Other  Continuous Blood Gluc Sensor (FREESTYLE LIBRE 2 SENSOR) [place] [id number]  1 each by Does not apply route every 14 (fourteen) days. Cox, Kirsten, MD  Active Spouse/Significant Other  cyclobenzaprine (FLEXERIL) 5 MG tablet [id number] Yes Take 1 tablet (5 mg total) by mouth 3 (three) times daily as needed for muscle spasms. Rip Harbour, NP Past Month Active Spouse/Significant Other  dapagliflozin propanediol (FARXIGA) 5 MG TABS tablet [id number] Yes Take 1 tablet (5 mg total) by mouth daily.  Patient taking differently: Take 5 mg by mouth daily. Pt takes in the am   [name], MD 07/31/2021 Active Spouse/Significant Other  Dulaglutide (TRULICITY) 3 ME/2.6ST SOPN [id number] Yes Inject 3 mg as directed once a week.  Patient taking differently: Inject 3 mg as directed once a week. Pt takes on Tuesdays   Cox, Kirsten, MD  07/25/2021 Active Spouse/Significant Other  ezetimibe (ZETIA) 10 MG tablet [id number] Yes TAKE ONE (1) TABLET ONCE DAILY Cox, Kirsten, MD 07/31/2021 Active Spouse/Significant Other  gabapentin (NEURONTIN) 300 MG capsule [id number] Yes TAKE ONE (1) CAPSULE THREE (3) TIMES EACH DAY Cox, Kirsten, MD Past Month Active Spouse/Significant Other  ibuprofen (ADVIL) 800 MG tablet [id number] Yes Take 1 tablet (800 mg total) by mouth every 8 (eight) hours as needed for moderate pain. Rip Harbour, NP Past Week Active Spouse/Significant Other  insulin aspart (FIASP FLEXTOUCH) 100 UNIT/ML FlexTouch Pen [id number] Yes Dose AT THE TIME OF MEAL as follows: 100-150 = 4 U, 151-200 = 8 U, 201-250 = 16 U, 251-300 = 20 U, Greater than 300 = 24 U.  Patient taking differently: Inject 10 Units into the skin in the morning and at bedtime. Pt covers with Sliding Scale in am at breakfast and in the pm at supper   [name], MD 07/30/2021 Active Spouse/Significant Other  LANTUS SOLOSTAR 100 UNIT/ML Solostar Pen [id number] Yes Inject 65 Units into the skin daily.  Patient taking differently: Inject 65 Units into the skin daily. Pt takes in the pm per wife   [name], MD 07/30/2021 Active Spouse/Significant Other  lisinopril (ZESTRIL) 2.5 MG tablet [id number] Yes Once daily  Patient taking differently: Once daily in the am   [name], MD 07/31/2021 Active Spouse/Significant Other  Meclizine HCl 25 MG CHEW [id number] Yes TAKE 1 TABLET THREE TIMES DAILY AS NEEDED FOR Elijah Birk, Kirsten, MD Past Month Active Spouse/Significant Other  meloxicam (MOBIC) 15 MG tablet [id number] Yes Take 1 tablet (15 mg total) by mouth daily. Cox, Kirsten, MD Past Month Active Spouse/Significant Other  metoprolol succinate (TOPROL-XL) 25 MG 24 hr tablet [id number] Yes TAKE ONE (1) TABLET ONCE DAILY  Patient taking differently: TAKE ONE (1) TABLET ONCE DAILY takes in am   [name], MD 07/31/2021 1000 Active Spouse/Significant Other   omega-3 acid ethyl esters (LOVAZA) 1 g capsule [id number] No Take 2 capsules (2 g total) by mouth 2 (two) times daily. Cox, Kirsten, MD Unknown Active Spouse/Significant Other  predniSONE (DELTASONE) 10 MG tablet [id number] No Take 1 tablet (10 mg total) by mouth 2 (two) times daily  with a meal.  Patient not taking: Reported on 07/21/2021   Rip Harbour, NP Not Taking Active Spouse/Significant Other  solifenacin (VESICARE) 10 MG tablet [id number] Yes Take 10 mg by mouth daily. Takes at 1000 am daily [provider] Past Month Active Spouse/Significant Other  tamsulosin (FLOMAX) 0.4 MG CAPS capsule [id number] Yes Take 0.8 mg by mouth at bedtime. [provider] Past Month Active Spouse/Significant Other  zolpidem (AMBIEN) 10 MG tablet [id number] No Take 10 mg by mouth at bedtime.  [provider] More than a month Active Spouse/Significant Other            Patient Active Problem List   Diagnosis Date Noted   BPH with obstruction/lower urinary tract symptoms 08/01/2021   Ulcer of left foot ([place]) 06/14/2021   Preoperative general physical examination 05/01/2021   Mixed hyperlipidemia 11/30/2019   Diabetic polyneuropathy ([place]) 11/30/2019   Hypertensive heart disease without heart failure 11/30/2019   Chronic left shoulder pain 11/30/2019   Obstructive sleep apnea 04/03/2018   Atherosclerosis of native coronary artery of native heart without angina pectoris 03/28/2018   Old myocardial infarction 03/28/2018    Immunization History  Administered Date(s) Administered   Fluad Quad(high Dose 65+) 06/28/2020, 04/25/2021   PFIZER(Purple Top)SARS-COV-2 Vaccination 12/10/2019, 01/08/2020   PNEUMOCOCCAL CONJUGATE-20 05/26/2021   Pneumococcal Polysaccharide-23 06/28/2020    Conditions to be addressed/monitored:  Hypertension, Hyperlipidemia, and Diabetes  Care Plan : [place]  Updates made by Lane Hacker, RPH since 09/01/2021 12:00 AM      Problem: DM, Lipids, Pain, HTN   Priority: High  Onset Date: 08/24/2021     Long-Range Goal: Disease State Management   Start Date: 08/24/2021  Expected End Date: 08/24/2022  Recent Progress: On track  Priority: High  Note:   Current Barriers:  Does not contact provider office for questions/concerns  Pharmacist Clinical Goal(s):  Patient will contact provider office for questions/concerns as evidenced notation of same in electronic health record through collaboration with PharmD and provider.   Interventions: 1:1 collaboration with Cox, Elnita Maxwell, MD regarding development and update of comprehensive plan of care as evidenced by provider attestation and co-signature Inter-disciplinary care team collaboration (see longitudinal plan of care) Comprehensive medication review performed; medication list updated in electronic medical record  Hypertension (BP goal <140/90) BP Readings from Last 3 Encounters:  08/18/21 124/60  08/02/21 107/66  07/26/21 (!) 151/89  -Controlled -Current treatment: Lisinopril 5mg  QD Appropriate, Effective, Safe, Accessible Metoprolol Succ 25mg  QD Appropriate, Effective, Safe, Accessible -Medications previously tried: N/A  -Current home readings: Doesn't test -Current dietary habits: "Tries to eat healthy" -Current exercise habits: None -Denies hypotensive/hypertensive symptoms -Educated on BP goals and benefits of medications for prevention of heart attack, stroke and kidney damage; -Counseled to monitor BP at home PRN, document, and provide log at future appointments -Recommended to continue current medication  Hyperlipidemia: (LDL goal < 70) The ASCVD Risk score (Arnett DK, et al., 2019) failed to calculate for the following reasons:   The patient has a prior MI or stroke diagnosis Lab Results  Component Value Date   CHOL 199 08/18/2021   CHOL 140 04/25/2021   CHOL 146 01/18/2021   Lab Results  Component Value Date   HDL 27 (L) 08/18/2021    HDL 26 (L) 04/25/2021   HDL 30 (L) 01/18/2021   Lab Results  Component Value Date   LDLCALC 143 (H) 08/18/2021   [place] 93 04/25/2021   [place] 95 01/18/2021   Lab  Results  Component Value Date   TRIG 159 (H) 08/18/2021   TRIG 113 04/25/2021   TRIG 113 01/18/2021   Lab Results  Component Value Date   CHOLHDL 7.4 (H) 08/18/2021   CHOLHDL 5.4 (H) 04/25/2021   CHOLHDL 4.9 01/18/2021  No results found for: LDLDIRECT -Uncontrolled -Current treatment: Atorvastatin 80mg  Appropriate, Query effective, Query Safe, Accessible -Medications previously tried: N/A  -Current dietary patterns: "Tries to eat healthy" -Current exercise habits: None -Educated on Cholesterol goals;  Jan 2023: Main priority was Elenor Legato today, will talk about statin compliance next week  Diabetes (A1c goal <8%) Lab Results  Component Value Date   HGBA1C 8.7 (H) 07/26/2021   HGBA1C 8.1 (H) 04/25/2021   HGBA1C 8.4 (H) 01/18/2021   Lab Results  Component Value Date   MICROALBUR 150 08/18/2021   LDLCALC 143 (H) 08/18/2021   CREATININE 0.87 07/26/2021   Lab Results  Component Value Date   NA 136 07/26/2021   K 4.3 07/26/2021   CREATININE 0.87 07/26/2021   EGFR 95 04/25/2021   GFRNONAA >60 07/26/2021   GLUCOSE 168 (H) 07/26/2021   Lab Results  Component Value Date   WBC 11.0 (H) 07/26/2021   HGB 13.6 07/26/2021   HCT 41.5 07/26/2021   MCV 88.1 07/26/2021   PLT 185 07/26/2021  -Uncontrolled -Current medications: Libre Appropriate, Query effective, Safe, Query accessible Dapagliflozin 10mg  Appropriate, Query effective, Safe, Query accessible Trulicity 4.5mg /week Appropriate, Query effective, Safe, Query accessible ASPART SS Appropriate, Query effective, Safe, Query accessible -Medications previously tried: None  -Current home glucose readings fasting glucose:  Jan 2023: Doesn't test, doesn't have meter/Libre post prandial glucose:  -Denies hypoglycemic/hyperglycemic symptoms -Current exercise:  None -Educated on A1c and blood sugar goals; -Counseled to check feet daily and get yearly eye exams Jan 2023: Main priority was getting Libre. Spoke with wife, will call  [phone], [facility], to see status of Elenor Legato. Will f/u next week to hear what next step is. Patient unable to get Trulicity 4.5mg /week, had to get 3.0mg  pen from Rx 09/01/21: They called [facility] and was told they only have Decomm in stock and need a new script. Wife did not call me and has been waiting for script to be sent in. She came to office and picked up [place] sample to last until [facility], but they haven't started using it yet. Will get DEXCOMM sent ASAP  Chronic Pain -Controlled -Current treatment: Gabapentin 300mg  QD Query Appropriate, Query effective, Safe, Accessible Hydrocodone/APAP 5/325 Query Appropriate, Query effective, Safe, Accessible IBU 800mg  TID PRN Appropriate, Effective, Safe, Accessible -Medications previously tried: None  -Pain Scale There were no vitals filed for this visit.  Aggravating Factors: Movement  Pain Type: Patient didn't specify  Jan 2023: Main priority was Elenor Legato, will go over pain at f/u   Insomnia (Goal: 7-8 hours of sleep/night) -Not ideally controlled -Patient currently getting unknown hours of sleep/night -Patient currently waking up unknown times/night -Current treatment: Zolpidem 10mg  HS Query Appropriate, Query effective, Query Safe, Query accessible -Medications previously tried: N/A -Counseled on sleep hygiene techniques (consistent sleep/wake up schedule, no electronic screens 1-2 hours before bed, etc) Jan 2023: Main priority was Elenor Legato, will go over sleep at f/u   BPH (Goal: improve urination) -Not ideally controlled -Night time urination frequency: unknown/night -Current treatment: Tamsulosin 0.8mg  HS Appropriate, Query effective, Safe, Accessible -Medications previously tried: None -Counseled on non-pharmacologic techniques such as Kegels Jan 2023: Main  priority was Elenor Legato, will go over BPHat f/u  Patient Goals/Self-Care Activities Patient will:  -  take medications as prescribed as evidenced by patient report and record review  Follow Up Plan: The patient has been provided with contact information for the care management team and has been advised to call with any health related questions or concerns.   CPP F/U 1 week   [place] Constable, Pharm.D. - [phone]        Medication Assistance: None required.  Patient affirms current coverage meets needs.  Compliance/Adherence/Medication fill history: Care Gaps: Last eye exam / Retinopathy Screening? None Next Annual Wellness Visit? 08-03-2021 Last Diabetic Foot Exam? 06-19-2021 Tdap overdue Shingrix Colonoscopy overdue Yearly ophthalmology overdue   Star Rating Drugs: Lisinopril 2.5 mg- Last filled 05-16-2021 90 DS Farxiga 5 mg- Last filled [id number] DS Trulicity 3 mg- Last filled 07-17-2021 28 DS Atorvastatin 80 mg- Last filled 07-18-2021 90 DS  Patient's preferred pharmacy is:  [facility], [place] - [place]. [place] [address] Phone: [phone] Fax: [phone]  [facility] - [place], [place] - [address] [place] [address] Phone: [phone] Fax: [phone]  Uses pill box? No -   Pt endorses 100% compliance  We discussed: Benefits of medication synchronization, packaging and delivery as well as enhanced pharmacist oversight with [employer]. Patient decided to: Continue current medication management strategy  Care Plan and Follow Up Patient Decision:  Patient agrees to Care Plan and Follow-up.  Plan: The patient has been provided with contact information for the care management team and has been advised to call with any health related questions or concerns.   CPP F/U 1 week  [place] Constable, Pharm.D. - [phone]

## 2021-09-03 ENCOUNTER — Encounter: Payer: Self-pay | Admitting: Family Medicine

## 2021-09-03 NOTE — Assessment & Plan Note (Signed)
Continue metoprolol.  Continue statin

## 2021-09-03 NOTE — Assessment & Plan Note (Addendum)
Increase Trulicity to 4.5 mg weekly.  Continue other diabetes medicines as prescribed. Initially sent free style Libre 2 sensors to [facility], but changed to [facility] because [facility] only does dexcom.

## 2021-09-03 NOTE — Assessment & Plan Note (Signed)
>>  ASSESSMENT AND PLAN FOR DIABETIC POLYNEUROPATHY ([facility]) WRITTEN ON 09/03/2021 10:45 PM BY COX, KIRSTEN, MD  Increase Trulicity to 4.5 mg weekly.  Continue other diabetes medicines as prescribed. Initially sent free style Libre 2 sensors to [facility], but changed to [facility] because [facility] only does dexcom.

## 2021-09-04 ENCOUNTER — Other Ambulatory Visit: Payer: Self-pay

## 2021-09-04 MED ORDER — DEXCOM G6 SENSOR MISC: 2 refills | Status: DC

## 2021-09-04 MED ORDER — DEXCOM G6 TRANSMITTER MISC: 4 refills | Status: DC

## 2021-09-04 MED ORDER — DEXCOM G6 RECEIVER DEVI: 0 refills | Status: DC

## 2021-09-12 DIAGNOSIS — Z794 Long term (current) use of insulin: Secondary | ICD-10-CM

## 2021-09-12 DIAGNOSIS — E1169 Type 2 diabetes mellitus with other specified complication: Secondary | ICD-10-CM | POA: Diagnosis not present

## 2021-09-12 DIAGNOSIS — N400 Enlarged prostate without lower urinary tract symptoms: Secondary | ICD-10-CM

## 2021-09-12 DIAGNOSIS — E785 Hyperlipidemia, unspecified: Secondary | ICD-10-CM | POA: Diagnosis not present

## 2021-09-12 DIAGNOSIS — I10 Essential (primary) hypertension: Secondary | ICD-10-CM

## 2021-09-14 DIAGNOSIS — I2510 Atherosclerotic heart disease of native coronary artery without angina pectoris: Secondary | ICD-10-CM | POA: Diagnosis not present

## 2021-09-14 DIAGNOSIS — I119 Hypertensive heart disease without heart failure: Secondary | ICD-10-CM | POA: Diagnosis not present

## 2021-09-18 ENCOUNTER — Ambulatory Visit (INDEPENDENT_AMBULATORY_CARE_PROVIDER_SITE_OTHER): Payer: HMO | Admitting: Nurse Practitioner

## 2021-09-18 ENCOUNTER — Encounter: Payer: Self-pay | Admitting: Nurse Practitioner

## 2021-09-18 ENCOUNTER — Other Ambulatory Visit: Payer: Self-pay

## 2021-09-18 VITALS — BP 130/84 | HR 94 | Temp 97.6°F | Ht 69.0 in | Wt 204.0 lb

## 2021-09-18 DIAGNOSIS — H6123 Impacted cerumen, bilateral: Secondary | ICD-10-CM

## 2021-09-18 NOTE — Patient Instructions (Signed)
Instill Debrox drops to bilateral ears to soften ear wax   Carbamide Peroxide ear solution What is this medication? CARBAMIDE PEROXIDE (CAR bah mide  per OX ide) is used to soften and help remove ear wax. This medicine may be used for other purposes; ask your health care provider or pharmacist if you have questions. COMMON BRAND NAME(S): Auro Ear, Auro Earache Relief, Debrox, Ear Drops, Ear Wax Removal, Ear Wax Remover, Earwax Treatment, Murine, Thera-Ear What should I tell my care team before I take this medication? They need to know if you have any of these conditions: dizziness ear discharge ear pain, irritation or rash infection perforated eardrum (hole in eardrum) an unusual or allergic reaction to carbamide peroxide, glycerin, hydrogen peroxide, other medicines, foods, dyes, or preservatives pregnant or trying to get pregnant breast-feeding How should I use this medication? This medicine is only for use in the outer ear canal. Follow the directions carefully. Wash hands before and after use. The solution may be warmed by holding the bottle in the hand for 1 to 2 minutes. Lie with the affected ear facing upward. Place the proper number of drops into the ear canal. After the drops are instilled, remain lying with the affected ear upward for 5 minutes to help the drops stay in the ear canal. A cotton ball may be gently inserted at the ear opening for no longer than 5 to 10 minutes to ensure retention. Repeat, if necessary, for the opposite ear. Do not touch the tip of the dropper to the ear, fingertips, or other surface. Do not rinse the dropper after use. Keep container tightly closed. Talk to your pediatrician regarding the use of this medicine in children. While this drug may be used in children as young as 12 years for selected conditions, precautions do apply. Overdosage: If you think you have taken too much of this medicine contact a poison control center or emergency room at  once. NOTE: This medicine is only for you. Do not share this medicine with others. What if I miss a dose? If you miss a dose, use it as soon as you can. If it is almost time for your next dose, use only that dose. Do not use double or extra doses. What may interact with this medication? Interactions are not expected. Do not use any other ear products without asking your doctor or health care professional. This list may not describe all possible interactions. Give your health care provider a list of all the medicines, herbs, non-prescription drugs, or dietary supplements you use. Also tell them if you smoke, drink alcohol, or use illegal drugs. Some items may interact with your medicine. What should I watch for while using this medication? This medicine is not for long-term use. Do not use for more than 4 days without checking with your health care professional. Contact your doctor or health care professional if your condition does not start to get better within a few days or if you notice burning, redness, itching or swelling. What side effects may I notice from receiving this medication? Side effects that you should report to your doctor or health care professional as soon as possible: allergic reactions like skin rash, itching or hives, swelling of the face, lips, or tongue burning, itching, and redness worsening ear pain rash Side effects that usually do not require medical attention (report to your doctor or health care professional if they continue or are bothersome): abnormal sensation while putting the drops in the ear temporary reduction  in hearing (but not complete loss of hearing) This list may not describe all possible side effects. Call your doctor for medical advice about side effects. You may report side effects to FDA at 1-800-FDA-1088. Where should I keep my medication? Keep out of the reach of children. Store at room temperature between 15 and 30 degrees C (59 and 86 degrees F)  in a tight, light-resistant container. Keep bottle away from excessive heat and direct sunlight. Throw away any unused medicine after the expiration date. NOTE: This sheet is a summary. It may not cover all possible information. If you have questions about this medicine, talk to your doctor, pharmacist, or health care provider.  2022 Elsevier/Gold Standard (2008-01-09 00:00:00)   Earwax Buildup, Adult The ears produce a substance called earwax that helps keep bacteria out of the ear and protects the skin in the ear canal. Occasionally, earwax can build up in the ear and cause discomfort or hearing loss. What are the causes? This condition is caused by a buildup of earwax. Ear canals are self-cleaning. Ear wax is made in the outer part of the ear canal and generally falls out in small amounts over time. When the self-cleaning mechanism is not working, earwax builds up and can cause decreased hearing and discomfort. Attempting to clean ears with cotton swabs can push the earwax deep into the ear canal and cause decreased hearing and pain. What increases the risk? This condition is more likely to develop in people who: Clean their ears often with cotton swabs. Pick at their ears. Use earplugs or in-ear headphones often, or wear hearing aids. The following factors may also make you more likely to develop this condition: Being male. Being of older age. Naturally producing more earwax. Having narrow ear canals. Having earwax that is overly thick or sticky. Having excess hair in the ear canal. Having eczema. Being dehydrated. What are the signs or symptoms? Symptoms of this condition include: Reduced or muffled hearing. A feeling of fullness in the ear or feeling that the ear is plugged. Fluid coming from the ear. Ear pain or an itchy ear. Ringing in the ear. Coughing. Balance problems. An obvious piece of earwax that can be seen inside the ear canal. How is this diagnosed? This  condition may be diagnosed based on: Your symptoms. Your medical history. An ear exam. During the exam, your health care provider will look into your ear with an instrument called an otoscope. You may have tests, including a hearing test. How is this treated? This condition may be treated by: Using ear drops to soften the earwax. Having the earwax removed by a health care provider. The health care provider may: Flush the ear with water. Use an instrument that has a loop on the end (curette). Use a suction device. Having surgery to remove the wax buildup. This may be done in severe cases. Follow these instructions at home:  Take over-the-counter and prescription medicines only as told by your health care provider. Do not put any objects, including cotton swabs, into your ear. You can clean the opening of your ear canal with a washcloth or facial tissue. Follow instructions from your health care provider about cleaning your ears. Do not overclean your ears. Drink enough fluid to keep your urine pale yellow. This will help to thin the earwax. Keep all follow-up visits as told. If earwax builds up in your ears often or if you use hearing aids, consider seeing your health care provider for routine, preventive ear  cleanings. Ask your health care provider how often you should schedule your cleanings. If you have hearing aids, clean them according to instructions from the manufacturer and your health care provider. Contact a health care provider if: You have ear pain. You develop a fever. You have pus or other fluid coming from your ear. You have hearing loss. You have ringing in your ears that does not go away. You feel like the room is spinning (vertigo). Your symptoms do not improve with treatment. Get help right away if: You have bleeding from the affected ear. You have severe ear pain. Summary Earwax can build up in the ear and cause discomfort or hearing loss. The most common symptoms  of this condition include reduced or muffled hearing, a feeling of fullness in the ear, or feeling that the ear is plugged. This condition may be diagnosed based on your symptoms, your medical history, and an ear exam. This condition may be treated by using ear drops to soften the earwax or by having the earwax removed by a health care provider. Do not put any objects, including cotton swabs, into your ear. You can clean the opening of your ear canal with a washcloth or facial tissue. This information is not intended to replace advice given to you by your health care provider. Make sure you discuss any questions you have with your health care provider. Document Revised: 11/17/2019 Document Reviewed: 11/17/2019 Elsevier Patient Education  [place].

## 2021-09-18 NOTE — Progress Notes (Signed)
Acute Office Visit  Subjective:    Patient ID: Joshua Nelson, male    DOB: Oct 29, 1950, 71 y.o.   MRN: [id number]  Chief Complaint  Patient presents with   Cerumen Impaction        HPI: Patient is in today for bilateral ear Cerumen Impaction: Patient presents with bilateral ear diminished hearing, pressure, and popping. Symptoms began 2 days ago and are unchanged since that time. Treatment includes try to remove wax with q-tip.  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   PONV (postoperative nausea and vomiting)     Past Surgical History:  Procedure Laterality Date   CYSTOSCOPY WITH URETHRAL DILATATION N/A 08/01/2021   Procedure: CYSTOSCOPY WITH BALLOON URETHRAL DILATATION;  Surgeon: Robley Fries, MD;  Location: [facility];  Service: Urology;  Laterality: N/A;   heart stents     x 3   left cataract  10/2019   nose surgery for dog bite as a kid      right cataract Right 08/2019   TRANSURETHRAL RESECTION OF BLADDER TUMOR N/A 08/01/2021   Procedure: TRANSURETHRAL RESECTION OF PROSTATE;  Surgeon: Robley Fries, MD;  Location: [facility];  Service: Urology;  Laterality: N/A;  75 MINS    Family History  Problem Relation Age of Onset   Endometrial cancer Mother    CAD Mother    AAA (abdominal aortic aneurysm) Mother    Healthy Father    Heart attack Brother     Social History   Socioeconomic History   Marital status: Married    Spouse name: Not on file   Number of children: 1   Years of education: 12   Highest education level: High school graduate  Occupational History   Occupation: Retired  Tobacco Use   Smoking status: Former    Types: Cigarettes    Quit date:  2003    Years since quitting: 20.1   Smokeless tobacco: Never  Vaping Use   Vaping Use: Never used  Substance and Sexual Activity   Alcohol use: Not Currently    Comment: none since 2000   Drug use: Never   Sexual activity: Not on file  Other Topics Concern   Not on file  Social History Narrative   Lives at home with his wife.   2 cups caffeine per day.   Right-handed.   Social Determinants of Health   Financial Resource Strain: High Risk   Difficulty of Paying Living Expenses: Hard  Food Insecurity: Not on file  Transportation Needs: No Transportation Needs   Lack of Transportation (Medical): No   Lack of Transportation (Non-Medical): No  Physical Activity: Not on file  Stress: Not on file  Social Connections: Not on file  Intimate Partner Violence: Not on file    Outpatient Medications Prior to Visit  Medication Sig Dispense Refill   atorvastatin (LIPITOR) 80 MG tablet TAKE ONE (1) TABLET ONCE DAILY 90 tablet 0   Continuous Blood Gluc Receiver (DEXCOM G6 RECEIVER) DEVI E11.42 Use as directed 1 each 0   Continuous Blood Gluc Sensor (DEXCOM G6 SENSOR) MISC E11.42 Change sensor every 10 days 3 each 2   Continuous Blood Gluc  Transmit (DEXCOM G6 TRANSMITTER) MISC E11.42 Change transmitter every 4 months 1 each 4   cyclobenzaprine (FLEXERIL) 5 MG tablet Take 1 tablet (5 mg total) by mouth 3 (three) times daily as needed for muscle spasms. 30 tablet 1   dapagliflozin propanediol (FARXIGA) 10 MG TABS tablet Take 1 tablet (10 mg total) by mouth daily. Pt takes in the am 90 tablet 1   Dulaglutide (TRULICITY) 4.5 TJ/0.3ES SOPN Inject 4.5 mg as directed once a week. 6 mL 0   ezetimibe (ZETIA) 10 MG tablet TAKE ONE (1) TABLET ONCE DAILY 90 tablet 1   gabapentin (NEURONTIN) 300 MG capsule TAKE ONE (1) CAPSULE THREE (3) TIMES EACH DAY 270 capsule 1   HYDROcodone-acetaminophen (NORCO/VICODIN) 5-325 MG tablet Take 1 tablet by mouth every 6 (six) hours as needed for moderate pain. 20 tablet  0   insulin aspart (FIASP FLEXTOUCH) 100 UNIT/ML FlexTouch Pen 10 U before breakfast and supper. 18 mL 0   LANTUS SOLOSTAR 100 UNIT/ML Solostar Pen Inject 65 Units into the skin daily. Pt takes in the pm per wife 30 mL 1   lisinopril (ZESTRIL) 5 MG tablet Once daily in the am 90 tablet 0   Meclizine HCl 25 MG CHEW TAKE 1 TABLET THREE TIMES DAILY AS NEEDED FOR DIZZINESS 30 tablet 0   metoprolol succinate (TOPROL-XL) 25 MG 24 hr tablet TAKE ONE (1) TABLET ONCE DAILY takes in am 90 tablet 1   omega-3 acid ethyl esters (LOVAZA) 1 g capsule Take 2 capsules (2 g total) by mouth 2 (two) times daily. 360 capsule 0   solifenacin (VESICARE) 10 MG tablet Take 10 mg by mouth daily. Takes at 1000 am daily     tamsulosin (FLOMAX) 0.4 MG CAPS capsule Take 0.8 mg by mouth at bedtime.     zolpidem (AMBIEN) 10 MG tablet Take 10 mg by mouth at bedtime.      No facility-administered medications prior to visit.    No Known Allergies  Review of Systems  Constitutional:  Negative for chills, fatigue and fever.  HENT:  Positive for ear pain (bilateral pressure, pain, popping L>R) and hearing loss (Left>Right). Negative for congestion, postnasal drip, rhinorrhea, sinus pressure, sinus pain and sore throat.   Respiratory:  Negative for cough and shortness of breath.   Cardiovascular:  Negative for chest pain.  Gastrointestinal:  Negative for diarrhea, nausea and vomiting.  Neurological:  Negative for dizziness and headaches.      Objective:    Physical Exam Vitals reviewed.  HENT:     Right Ear: There is impacted cerumen.     Left Ear: There is impacted cerumen.  Skin:    General: Skin is warm and dry.     Capillary Refill: Capillary refill takes less than 2 seconds.  Neurological:     General: No focal deficit present.     Mental Status: He is alert and oriented to person, place, and time.  Psychiatric:        Mood and Affect: Mood normal.        Behavior: Behavior normal.   Pulse 94    Temp 97.6 F  (36.4 C)    Ht $R'5\' 9"'zb$  (1.753 m)    Wt 204 lb (92.5 kg)    SpO2 99%    BMI 30.13 kg/m  BP 130/84    Pulse 94    Temp 97.6 F (36.4 C)    Ht $R'5\' 9"'vG$  (1.753 m)    Wt 204 lb (92.5 kg)  SpO2 99%    BMI 30.13 kg/m    Wt Readings from Last 3 Encounters:  09/18/21 204 lb (92.5 kg)  08/18/21 202 lb (91.6 kg)  08/01/21 184 lb (83.5 kg)    Health Maintenance Due  Topic Date Due   TETANUS/TDAP  Never done   Zoster Vaccines- Shingrix (1 of 2) Never done   COLONOSCOPY (Pts 45-6yrs Insurance coverage will need to be confirmed)  Never done   OPHTHALMOLOGY EXAM  09/24/2020       Lab Results  Component Value Date   TSH 0.687 06/28/2020   Lab Results  Component Value Date   WBC 11.0 (H) 07/26/2021   HGB 13.6 07/26/2021   HCT 41.5 07/26/2021   MCV 88.1 07/26/2021   PLT 185 07/26/2021   Lab Results  Component Value Date   NA 136 07/26/2021   K 4.3 07/26/2021   CO2 24 07/26/2021   GLUCOSE 168 (H) 07/26/2021   BUN 16 07/26/2021   CREATININE 0.87 07/26/2021   BILITOT 0.8 04/25/2021   ALKPHOS 61 04/25/2021   AST 17 04/25/2021   ALT 16 04/25/2021   PROT 6.7 04/25/2021   ALBUMIN 4.5 04/25/2021   CALCIUM 9.1 07/26/2021   ANIONGAP 9 07/26/2021   EGFR 95 04/25/2021   Lab Results  Component Value Date   CHOL 199 08/18/2021   Lab Results  Component Value Date   HDL 27 (L) 08/18/2021   Lab Results  Component Value Date   LDLCALC 143 (H) 08/18/2021   Lab Results  Component Value Date   TRIG 159 (H) 08/18/2021   Lab Results  Component Value Date   CHOLHDL 7.4 (H) 08/18/2021   Lab Results  Component Value Date   HGBA1C 8.7 (H) 07/26/2021       Assessment & Plan:   1. Bilateral impacted cerumen - Ear wax removal    Instill Debrox drops to bilateral ears to soften ear wax    Follow-up: PRN  An After Visit Summary was printed and given to the patient.  I, Rip Harbour, NP, have reviewed all documentation for this visit. The documentation on 09/18/21 for the  exam, diagnosis, procedures, and orders are all accurate and complete.    Signed, Rip Harbour, NP [place] [phone]

## 2021-09-25 ENCOUNTER — Encounter: Payer: Self-pay | Admitting: Podiatry

## 2021-09-25 ENCOUNTER — Ambulatory Visit (INDEPENDENT_AMBULATORY_CARE_PROVIDER_SITE_OTHER): Payer: HMO | Admitting: Podiatry

## 2021-09-25 DIAGNOSIS — E1169 Type 2 diabetes mellitus with other specified complication: Secondary | ICD-10-CM | POA: Diagnosis not present

## 2021-09-25 DIAGNOSIS — E1142 Type 2 diabetes mellitus with diabetic polyneuropathy: Secondary | ICD-10-CM

## 2021-09-25 DIAGNOSIS — B351 Tinea unguium: Secondary | ICD-10-CM

## 2021-09-25 NOTE — Progress Notes (Signed)
°  Subjective:  Patient ID: TYRION GLAUDE, male    DOB: Jan 19, 1951,  MRN: [id number]  Chief Complaint  Patient presents with   Nail Problem    Trim nails and the left big toenail is much better after Dr March Rummage trimmed on it and got the dark color out of it   71 y.o. male presents with the above complaint. History confirmed with patient.   Cox, Kirsten, MD last seen last week. Last AM BS 132. Last A1c "7 something.  Objective:  Physical Exam: warm, good capillary refill, nail exam onychomycosis of the toenails, no trophic changes or ulcerative lesions. DP pulses palpable, PT pulses palpable and protective sensation absent Hammertoes present bilateral feet.  No images are attached to the encounter.  Assessment:   1. Onychomycosis of multiple toenails with type 2 diabetes mellitus and peripheral neuropathy ([place])    Plan:  Patient was evaluated and treated and all questions answered.  Onychomycosis, Diabetes and DPN   -Patient is diabetic with a qualifying condition for at risk foot care. -Awaiting DM Shoes  Procedure: Nail Debridement Type of Debridement: manual, sharp debridement. Instrumentation: Nail nipper, rotary burr. Number of Nails: 10 Disposition: Patient tolerated well without iatrogenic injury.   No follow-ups on file.

## 2021-09-28 ENCOUNTER — Other Ambulatory Visit: Payer: Self-pay

## 2021-09-28 DIAGNOSIS — E1142 Type 2 diabetes mellitus with diabetic polyneuropathy: Secondary | ICD-10-CM

## 2021-09-28 MED ORDER — FREESTYLE LIBRE 2 SENSOR MISC: 2 refills | Status: DC

## 2021-10-04 DIAGNOSIS — C61 Malignant neoplasm of prostate: Secondary | ICD-10-CM | POA: Diagnosis not present

## 2021-10-10 ENCOUNTER — Other Ambulatory Visit: Payer: Self-pay

## 2021-10-10 MED ORDER — TRULICITY 4.5 MG/0.5ML ~~LOC~~ SOAJ: 4.5000 mg | SUBCUTANEOUS | 0 refills | Status: DC

## 2021-10-11 ENCOUNTER — Other Ambulatory Visit: Payer: Self-pay

## 2021-10-11 MED ORDER — TRULICITY 4.5 MG/0.5ML ~~LOC~~ SOAJ: 4.5000 mg | SUBCUTANEOUS | 0 refills | Status: DC

## 2021-10-12 NOTE — Telephone Encounter (Signed)
Joshua Nelson calling stating Dr needs to send prior approval to pharmacy.  ? ?Reached out to pharmacy for clarification. They do not have supply of 4.5 mg.  ? ?Joshua Nelson aware to reach out to other pharmacies to find supply. [facility] requesting Dr approve refill of 3.0 mg be filled until supply is found. Patient is out. Please advise.  ? ?Joshua Nelson 10/12/21 2:03 PM ? ?

## 2021-10-13 ENCOUNTER — Other Ambulatory Visit: Payer: Self-pay | Admitting: Family Medicine

## 2021-10-13 ENCOUNTER — Ambulatory Visit: Payer: Self-pay

## 2021-10-13 ENCOUNTER — Telehealth: Payer: Self-pay

## 2021-10-13 MED ORDER — TRULICITY 4.5 MG/0.5ML ~~LOC~~ SOAJ: 4.5000 mg | SUBCUTANEOUS | 0 refills | Status: DC

## 2021-10-13 NOTE — Chronic Care Management (AMB) (Addendum)
Prior authorization created in [employer] for Trulicity, per insurance plan Prior Authorization not required for patient/medication. ? ?[facility] still unable to process medication, still saying PA needed. Called [employer] prior authorization department, spoke with representative and she informed me a PA is needed, explained that one was already done through [employer]. She is unable to see it, Representative will be faxing a form to PCP office to complete.  ? ?CCM time: 15 mins ? ?Pattricia Boss, CMA ?Clinical Pharmacist Assistant ?[phone] ? ?

## 2021-10-13 NOTE — Telephone Encounter (Signed)
Trulicity not going through at Pharmacy per Dr. Tobie Poet. [place] said [place] required but there is no PA to be done. Will have script sent to [facility] to see what the error is (Unable to call [facility] since closed) and will work on script with [facility] team. ?

## 2021-10-17 ENCOUNTER — Telehealth: Payer: Self-pay

## 2021-10-17 NOTE — Chronic Care Management (AMB) (Signed)
? ? ?  Chronic Care Management ?Pharmacy Assistant  ? ?Name: Joshua Nelson  MRN: [id number] DOB: 29-Jul-1951 ? ?Reason for Encounter: Prior Authorization Coordination ? ?10/17/2021- Called [facility] to inform prior authorization from [facility] for Trulicity should have been faxed to the office to complete and if a clinical staff is available to fill out and fax back to [facility] to process patient medication.  ? ?Medications: ?Outpatient Encounter Medications as of 10/17/2021  ?Medication Sig  ? Continuous Blood Gluc Sensor (FREESTYLE LIBRE 2 SENSOR) MISC E11.42 Change sensor every 10 days.  ? atorvastatin (LIPITOR) 80 MG tablet TAKE ONE (1) TABLET ONCE DAILY  ? cyclobenzaprine (FLEXERIL) 5 MG tablet Take 1 tablet (5 mg total) by mouth 3 (three) times daily as needed for muscle spasms.  ? dapagliflozin propanediol (FARXIGA) 10 MG TABS tablet Take 1 tablet (10 mg total) by mouth daily. Pt takes in the am  ? Dulaglutide (TRULICITY) 4.5 WP/8.0DX SOPN Inject 4.5 mg as directed once a week.  ? ezetimibe (ZETIA) 10 MG tablet TAKE ONE (1) TABLET ONCE DAILY  ? gabapentin (NEURONTIN) 300 MG capsule TAKE ONE (1) CAPSULE THREE (3) TIMES EACH DAY  ? HYDROcodone-acetaminophen (NORCO/VICODIN) 5-325 MG tablet Take 1 tablet by mouth every 6 (six) hours as needed for moderate pain.  ? insulin aspart (FIASP FLEXTOUCH) 100 UNIT/ML FlexTouch Pen 10 U before breakfast and supper.  ? LANTUS SOLOSTAR 100 UNIT/ML Solostar Pen Inject 65 Units into the skin daily. Pt takes in the pm per wife  ? lisinopril (ZESTRIL) 5 MG tablet Once daily in the am  ? Meclizine HCl 25 MG CHEW TAKE 1 TABLET THREE TIMES DAILY AS NEEDED FOR DIZZINESS  ? metoprolol succinate (TOPROL-XL) 25 MG 24 hr tablet TAKE ONE (1) TABLET ONCE DAILY takes in am  ? omega-3 acid ethyl esters (LOVAZA) 1 g capsule Take 2 capsules (2 g total) by mouth 2 (two) times daily.  ? solifenacin (VESICARE) 10 MG tablet Take 10 mg by mouth daily. Takes at 1000 am daily  ? tamsulosin  (FLOMAX) 0.4 MG CAPS capsule Take 0.8 mg by mouth at bedtime.  ? zolpidem (AMBIEN) 10 MG tablet Take 10 mg by mouth at bedtime.   ? ?No facility-administered encounter medications on file as of 10/17/2021.  ? ?Pattricia Boss, CMA ?Clinical Pharmacist Assistant ?[phone] ? ?

## 2021-10-25 ENCOUNTER — Other Ambulatory Visit: Payer: Self-pay | Admitting: Family Medicine

## 2021-10-25 MED ORDER — TRULICITY 3 MG/0.5ML ~~LOC~~ SOAJ: 3.0000 mg | SUBCUTANEOUS | 0 refills | Status: DC

## 2021-10-27 NOTE — Telephone Encounter (Signed)
Coordinated with [facility] to see if able to put through script of Trulicity ?

## 2021-11-20 DIAGNOSIS — R972 Elevated prostate specific antigen [PSA]: Secondary | ICD-10-CM | POA: Diagnosis not present

## 2021-11-20 DIAGNOSIS — C61 Malignant neoplasm of prostate: Secondary | ICD-10-CM | POA: Diagnosis not present

## 2021-11-21 ENCOUNTER — Ambulatory Visit: Payer: HMO | Admitting: Family Medicine

## 2021-11-23 ENCOUNTER — Other Ambulatory Visit: Payer: HMO

## 2021-11-23 DIAGNOSIS — I119 Hypertensive heart disease without heart failure: Secondary | ICD-10-CM

## 2021-11-23 DIAGNOSIS — E1142 Type 2 diabetes mellitus with diabetic polyneuropathy: Secondary | ICD-10-CM

## 2021-11-23 DIAGNOSIS — E782 Mixed hyperlipidemia: Secondary | ICD-10-CM | POA: Diagnosis not present

## 2021-11-24 LAB — CBC WITH DIFF/PLATELET
Basophils Absolute: 0.1 10*3/uL (ref 0.0–0.2)
Basos: 1 %
EOS (ABSOLUTE): 0.3 10*3/uL (ref 0.0–0.4)
Eos: 4 %
Hematocrit: 44 % (ref 37.5–51.0)
Hemoglobin: 14.5 g/dL (ref 13.0–17.7)
Immature Grans (Abs): 0 10*3/uL (ref 0.0–0.1)
Immature Granulocytes: 0 %
Lymphocytes Absolute: 1.7 10*3/uL (ref 0.7–3.1)
Lymphs: 19 %
MCH: 29.1 pg (ref 26.6–33.0)
MCHC: 33 g/dL (ref 31.5–35.7)
MCV: 88 fL (ref 79–97)
Monocytes Absolute: 0.6 10*3/uL (ref 0.1–0.9)
Monocytes: 6 %
Neutrophils Absolute: 6.2 10*3/uL (ref 1.4–7.0)
Neutrophils: 70 %
Platelets: 186 10*3/uL (ref 150–450)
RBC: 4.98 x10E6/uL (ref 4.14–5.80)
RDW: 13.2 % (ref 11.6–15.4)
WBC: 8.9 10*3/uL (ref 3.4–10.8)

## 2021-11-24 LAB — LIPID PANEL
Chol/HDL Ratio: 3.6 ratio (ref 0.0–5.0)
Cholesterol, Total: 123 mg/dL (ref 100–199)
HDL: 34 mg/dL — ABNORMAL LOW (ref >39)
LDL Chol Calc (NIH): 74 mg/dL (ref 0–99)
Triglycerides: 73 mg/dL (ref 0–149)
VLDL Cholesterol Cal: 15 mg/dL (ref 5–40)

## 2021-11-24 LAB — COMPREHENSIVE METABOLIC PANEL
ALT: 18 IU/L (ref 0–44)
AST: 17 IU/L (ref 0–40)
Albumin/Globulin Ratio: 1.8 (ref 1.2–2.2)
Albumin: 4.5 g/dL (ref 3.8–4.8)
Alkaline Phosphatase: 80 IU/L (ref 44–121)
BUN/Creatinine Ratio: 14 (ref 10–24)
BUN: 12 mg/dL (ref 8–27)
Bilirubin Total: 0.7 mg/dL (ref 0.0–1.2)
CO2: 26 mmol/L (ref 20–29)
Calcium: 9.8 mg/dL (ref 8.6–10.2)
Chloride: 103 mmol/L (ref 96–106)
Creatinine, Ser: 0.85 mg/dL (ref 0.76–1.27)
Globulin, Total: 2.5 g/dL (ref 1.5–4.5)
Glucose: 111 mg/dL — ABNORMAL HIGH (ref 70–99)
Potassium: 4.9 mmol/L (ref 3.5–5.2)
Sodium: 145 mmol/L — ABNORMAL HIGH (ref 134–144)
Total Protein: 7 g/dL (ref 6.0–8.5)
eGFR: 95 mL/min/{1.73_m2} (ref >59)

## 2021-11-24 LAB — HEMOGLOBIN A1C
Est. average glucose Bld gHb Est-mCnc: 169 mg/dL
Hgb A1c MFr Bld: 7.5 % — ABNORMAL HIGH (ref 4.8–5.6)

## 2021-11-24 LAB — CARDIOVASCULAR RISK ASSESSMENT

## 2021-11-27 ENCOUNTER — Encounter: Payer: Self-pay | Admitting: Family Medicine

## 2021-11-27 ENCOUNTER — Ambulatory Visit (INDEPENDENT_AMBULATORY_CARE_PROVIDER_SITE_OTHER): Payer: HMO | Admitting: Family Medicine

## 2021-11-27 VITALS — BP 122/78 | HR 78 | Temp 98.3°F | Ht 69.0 in | Wt 202.0 lb

## 2021-11-27 DIAGNOSIS — I700 Atherosclerosis of aorta: Secondary | ICD-10-CM | POA: Diagnosis not present

## 2021-11-27 DIAGNOSIS — E118 Type 2 diabetes mellitus with unspecified complications: Secondary | ICD-10-CM | POA: Diagnosis not present

## 2021-11-27 DIAGNOSIS — E782 Mixed hyperlipidemia: Secondary | ICD-10-CM | POA: Diagnosis not present

## 2021-11-27 DIAGNOSIS — I2510 Atherosclerotic heart disease of native coronary artery without angina pectoris: Secondary | ICD-10-CM | POA: Diagnosis not present

## 2021-11-27 DIAGNOSIS — E1142 Type 2 diabetes mellitus with diabetic polyneuropathy: Secondary | ICD-10-CM

## 2021-11-27 DIAGNOSIS — N401 Enlarged prostate with lower urinary tract symptoms: Secondary | ICD-10-CM | POA: Diagnosis not present

## 2021-11-27 DIAGNOSIS — Z794 Long term (current) use of insulin: Secondary | ICD-10-CM | POA: Diagnosis not present

## 2021-11-27 DIAGNOSIS — N138 Other obstructive and reflux uropathy: Secondary | ICD-10-CM | POA: Diagnosis not present

## 2021-11-27 MED ORDER — TRULICITY 4.5 MG/0.5ML ~~LOC~~ SOAJ: 4.5000 mg | SUBCUTANEOUS | 0 refills | Status: DC

## 2021-11-27 NOTE — Patient Instructions (Signed)
Increase trulicity to 4.5 mg weekly. ?

## 2021-11-27 NOTE — Progress Notes (Signed)
? ?Subjective:  ?Patient ID: Joshua Nelson, male    DOB: [birth year]  Age: 71 y.o. MRN: [id number] ? ?Chief Complaint  ?Patient presents with  ? Diabetes  ? Hyperlipidemia  ? Hypertension  ? ? ?Diabetes:  ?Complications: hyperlipidemia, hypertension. Neuropathy. ?Glucose checking: No patient only checks it when he feels bad, patient has [employer] 3. Had one come off last week and fell off.  ?Glucose logs: 120-150 ?Hypoglycemia: No ?Most recent A1C: 7.5 dropped from 8.9. ?Current medications: Trulicity '3mg'$  inject once weekly, Farxiga 10 mg take 1 tablet daily, Lantus inject 65 units daily, Fiasp 10 units before breakfast and supper. On gabapentin. ?Last Eye Exam: overdue.  ?Foot checks: Daily ? ?Hyperlipidemia: ?Current medications: Atorvastatin 80 mg take 1 tablet daily, Zetia 10 mg take 1 tablet daily, Lovaza 1 gm take 2 capsules by mouth BID. ? ?Hypertensive with heart disease:: ?Current medications: Metoprolol 25 mg take 1 tablet daily, Lisinopril 5 mg take 1 tablet daily ? ?Diet: Fairly healthy.  ?Exercise: some walking.  ?  ?Current Outpatient Medications on File Prior to Visit  ?Medication Sig Dispense Refill  ? atorvastatin (LIPITOR) 80 MG tablet TAKE ONE (1) TABLET ONCE DAILY 90 tablet 0  ? Continuous Blood Gluc Sensor (FREESTYLE LIBRE 2 SENSOR) MISC E11.42 Change sensor every 10 days. 3 each 2  ? cyclobenzaprine (FLEXERIL) 5 MG tablet Take 1 tablet (5 mg total) by mouth 3 (three) times daily as needed for muscle spasms. 30 tablet 1  ? dapagliflozin propanediol (FARXIGA) 10 MG TABS tablet Take 1 tablet (10 mg total) by mouth daily. Pt takes in the am 90 tablet 1  ? ezetimibe (ZETIA) 10 MG tablet TAKE ONE (1) TABLET ONCE DAILY 90 tablet 1  ? gabapentin (NEURONTIN) 300 MG capsule TAKE ONE (1) CAPSULE THREE (3) TIMES EACH DAY 270 capsule 1  ? HYDROcodone-acetaminophen (NORCO/VICODIN) 5-325 MG tablet Take 1 tablet by mouth every 6 (six) hours as needed for moderate pain. 20 tablet 0  ? insulin aspart (FIASP  FLEXTOUCH) 100 UNIT/ML FlexTouch Pen 10 U before breakfast and supper. 18 mL 0  ? LANTUS SOLOSTAR 100 UNIT/ML Solostar Pen Inject 65 Units into the skin daily. Pt takes in the pm per wife 30 mL 1  ? lisinopril (ZESTRIL) 5 MG tablet Once daily in the am 90 tablet 0  ? Meclizine HCl 25 MG CHEW TAKE 1 TABLET THREE TIMES DAILY AS NEEDED FOR DIZZINESS 30 tablet 0  ? metoprolol succinate (TOPROL-XL) 25 MG 24 hr tablet TAKE ONE (1) TABLET ONCE DAILY takes in am 90 tablet 1  ? omega-3 acid ethyl esters (LOVAZA) 1 g capsule Take 2 capsules (2 g total) by mouth 2 (two) times daily. 360 capsule 0  ? solifenacin (VESICARE) 10 MG tablet Take 10 mg by mouth daily. Takes at 1000 am daily    ? tamsulosin (FLOMAX) 0.4 MG CAPS capsule Take 0.8 mg by mouth at bedtime.    ? zolpidem (AMBIEN) 10 MG tablet Take 10 mg by mouth at bedtime.     ? ?No current facility-administered medications on file prior to visit.  ? ?Past Medical History:  ?Diagnosis Date  ? Arthritis   ? Cerebrovascular accident (CVA) ([place]) 04/03/2018  ? Coronary artery disease   ? CVA (cerebral vascular accident) [facility])   ? Diabetes mellitus without complication ([place])   ? Hypertension   ? Insomnia   ? Lacunar infarction ([place]) 03/28/2018  ? Myocardial infarct [facility])   ? 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 PONV (postoperative  nausea and vomiting)   ? ?Past Surgical History:  ?Procedure Laterality Date  ? CYSTOSCOPY WITH URETHRAL DILATATION N/A 08/01/2021  ? Procedure: CYSTOSCOPY WITH BALLOON URETHRAL DILATATION;  Surgeon: Robley Fries, MD;  Location: [facility];  Service: Urology;  Laterality: N/A;  ? heart stents    ? x 3  ? left cataract  10/2019  ? nose surgery for dog bite as a kid     ? right cataract Right 08/2019  ? TRANSURETHRAL RESECTION OF BLADDER TUMOR N/A 08/01/2021  ? Procedure: TRANSURETHRAL RESECTION OF PROSTATE;  Surgeon: Robley Fries, MD;  Location: [facility];  Service: Urology;  Laterality: N/A;  90 MINS  ?  ?Family History  ?Problem Relation Age of Onset  ?  Endometrial cancer Mother   ? CAD Mother   ? AAA (abdominal aortic aneurysm) Mother   ? Healthy Father   ? Heart attack Brother   ? ?Social History  ? ?Socioeconomic History  ? Marital status: Married  ?  Spouse name: Not on file  ? Number of children: 1  ? Years of education: 43  ? Highest education level: High school graduate  ?Occupational History  ? Occupation: Retired  ?Tobacco Use  ? Smoking status: Former  ?  Types: Cigarettes  ?  Quit date: 2003  ?  Years since quitting: 20.3  ? Smokeless tobacco: Never  ?Vaping Use  ? Vaping Use: Never used  ?Substance and Sexual Activity  ? Alcohol use: Not Currently  ?  Comment: none since 2000  ? Drug use: Never  ? Sexual activity: Not on file  ?Other Topics Concern  ? Not on file  ?Social History Narrative  ? Lives at home with his wife.  ? 2 cups caffeine per day.  ? Right-handed.  ? ?Social Determinants of Health  ? ?Financial Resource Strain: High Risk  ? Difficulty of Paying Living Expenses: Hard  ?Food Insecurity: Not on file  ?Transportation Needs: No Transportation Needs  ? Lack of Transportation (Medical): No  ? Lack of Transportation (Non-Medical): No  ?Physical Activity: Not on file  ?Stress: Not on file  ?Social Connections: Not on file  ? ? ?Review of Systems  ?Constitutional:  Negative for appetite change, fatigue and fever.  ?HENT:  Negative for congestion, ear pain, sinus pressure and sore throat.   ?Respiratory:  Negative for cough, chest tightness, shortness of breath and wheezing.   ?Cardiovascular:  Negative for chest pain and palpitations.  ?Gastrointestinal:  Negative for abdominal pain, constipation, diarrhea, nausea and vomiting.  ?Genitourinary:  Negative for dysuria and hematuria.  ?Musculoskeletal:  Negative for arthralgias, back pain, joint swelling and myalgias.  ?Skin:  Negative for rash.  ?Neurological:  Negative for dizziness, weakness and headaches.  ?Psychiatric/Behavioral:  Negative for dysphoric mood. The patient is not  nervous/anxious.   ? ? ?Objective:  ?BP 122/78 (BP Location: Left Arm, Patient Position: Sitting)   Pulse 78   Temp 98.3 ?F (36.8 ?C) (Oral)   Ht '5\' 9"'$  (1.753 m)   Wt 202 lb (91.6 kg)   SpO2 98%   BMI 29.83 kg/m?  ? ? ?  11/27/2021  ?  2:28 PM 09/18/2021  ? 11:03 AM 08/18/2021  ?  8:05 AM  ?BP/Weight  ?Systolic BP 564 332 951  ?Diastolic BP 78 84 60  ?Wt. (Lbs) 202 204 202  ?BMI 29.83 kg/m2 30.13 kg/m2 29.83 kg/m2  ? ? ?Physical Exam ?Constitutional:   ?   Appearance: Normal appearance. He is normal  weight.  ?Cardiovascular:  ?   Rate and Rhythm: Normal rate and regular rhythm.  ?   Pulses: Normal pulses.  ?   Heart sounds: Normal heart sounds.  ?Pulmonary:  ?   Breath sounds: Normal breath sounds.  ?Abdominal:  ?   General: Abdomen is flat. Bowel sounds are normal.  ?   Palpations: Abdomen is soft.  ?Neurological:  ?   Mental Status: He is alert and oriented to person, place, and time.  ?Psychiatric:     ?   Mood and Affect: Mood normal.     ?   Behavior: Behavior normal.  ? ? ?Diabetic Foot Exam - Simple   ?Simple Foot Form ? 11/27/2021 11:43 AM  ?Visual Inspection ?See comments: Yes ?Sensation Testing ?Intact to touch and monofilament testing bilaterally: Yes ?Pulse Check ?Posterior Tibialis and Dorsalis pulse intact bilaterally: Yes ?Comments ?Some calluses ?  ?  ? ?Lab Results  ?Component Value Date  ? WBC 8.9 11/23/2021  ? HGB 14.5 11/23/2021  ? HCT 44.0 11/23/2021  ? PLT 186 11/23/2021  ? GLUCOSE 111 (H) 11/23/2021  ? CHOL 123 11/23/2021  ? TRIG 73 11/23/2021  ? HDL 34 (L) 11/23/2021  ? [place] 74 11/23/2021  ? ALT 18 11/23/2021  ? AST 17 11/23/2021  ? NA 145 (H) 11/23/2021  ? K 4.9 11/23/2021  ? CL 103 11/23/2021  ? CREATININE 0.85 11/23/2021  ? BUN 12 11/23/2021  ? CO2 26 11/23/2021  ? TSH 0.687 06/28/2020  ? HGBA1C 7.5 (H) 11/23/2021  ? MICROALBUR 150 08/18/2021  ? ? ? ? ?Assessment & Plan:  ? ?Problem List Items Addressed This Visit   ? ?  ? Cardiovascular and Mediastinum  ? Atherosclerosis of native  coronary artery of native heart without angina pectoris  ?  Continue statin. Unclear if takng aspirin.  ? ?  ?  ? Atherosclerosis of aorta ([place])  ?  Continue Atorvastatin 80 mg take 1 tablet daily, Zetia 10 mg take 1 tablet

## 2021-11-29 DIAGNOSIS — I252 Old myocardial infarction: Secondary | ICD-10-CM | POA: Diagnosis not present

## 2021-11-29 DIAGNOSIS — I10 Essential (primary) hypertension: Secondary | ICD-10-CM | POA: Diagnosis not present

## 2021-11-29 DIAGNOSIS — M1990 Unspecified osteoarthritis, unspecified site: Secondary | ICD-10-CM | POA: Diagnosis not present

## 2021-11-29 DIAGNOSIS — G8929 Other chronic pain: Secondary | ICD-10-CM | POA: Diagnosis not present

## 2021-11-29 DIAGNOSIS — E785 Hyperlipidemia, unspecified: Secondary | ICD-10-CM | POA: Diagnosis not present

## 2021-11-29 DIAGNOSIS — Z794 Long term (current) use of insulin: Secondary | ICD-10-CM | POA: Diagnosis not present

## 2021-11-29 DIAGNOSIS — C61 Malignant neoplasm of prostate: Secondary | ICD-10-CM | POA: Diagnosis not present

## 2021-11-29 DIAGNOSIS — E1169 Type 2 diabetes mellitus with other specified complication: Secondary | ICD-10-CM | POA: Diagnosis not present

## 2021-11-29 DIAGNOSIS — E663 Overweight: Secondary | ICD-10-CM | POA: Diagnosis not present

## 2021-11-29 DIAGNOSIS — E1136 Type 2 diabetes mellitus with diabetic cataract: Secondary | ICD-10-CM | POA: Diagnosis not present

## 2021-11-29 DIAGNOSIS — G4700 Insomnia, unspecified: Secondary | ICD-10-CM | POA: Diagnosis not present

## 2021-11-29 DIAGNOSIS — I2510 Atherosclerotic heart disease of native coronary artery without angina pectoris: Secondary | ICD-10-CM | POA: Diagnosis not present

## 2021-12-03 DIAGNOSIS — I700 Atherosclerosis of aorta: Secondary | ICD-10-CM | POA: Insufficient documentation

## 2021-12-03 DIAGNOSIS — E118 Type 2 diabetes mellitus with unspecified complications: Secondary | ICD-10-CM | POA: Insufficient documentation

## 2021-12-03 NOTE — Assessment & Plan Note (Addendum)
Control: good ?Recommend continue with CGM. Sample given. ?Recommend check feet daily. ?Recommend annual eye exams. ?Medicines: Increase Trulicity 4.5 mg inject once weekly, Farxiga 10 mg take 1 tablet daily, Lantus inject 65 units daily, Fiasp 10 units before breakfast and supper. On gabapentin. ?Last Eye Exam: overdue. Continue to work on eating a healthy diet and exercise.  ?Labs drawn today.   ? ?

## 2021-12-03 NOTE — Assessment & Plan Note (Signed)
>>  ASSESSMENT AND PLAN FOR DIABETIC POLYNEUROPATHY ([facility]) WRITTEN ON 12/03/2021 11:50 AM BY COX, KIRSTEN, MD  See above. Continue gabapentin.

## 2021-12-03 NOTE — Assessment & Plan Note (Addendum)
Continue vesicare 

## 2021-12-03 NOTE — Assessment & Plan Note (Signed)
Continue Atorvastatin 80 mg take 1 tablet daily, Zetia 10 mg take 1 tablet daily, Lovaza 1 gm take 2 capsules by mouth BID. ?

## 2021-12-03 NOTE — Assessment & Plan Note (Addendum)
>>  ASSESSMENT AND PLAN FOR ATHEROSCLEROSIS OF NATIVE CORONARY ARTERY OF NATIVE HEART WITHOUT ANGINA PECTORIS WRITTEN ON 12/03/2021 11:54 AM BY Shellee Streng, MD  Continue statin. Unclear if takng aspirin.   >>ASSESSMENT AND PLAN FOR ATHEROSCLEROSIS OF AORTA ([place]) WRITTEN ON 12/03/2021 11:48 AM BY Hutchinson Isenberg, MD  Continue Atorvastatin 80 mg take 1 tablet daily, Zetia 10 mg take 1 tablet daily, Lovaza 1 gm take 2 capsules by mouth BID.

## 2021-12-03 NOTE — Assessment & Plan Note (Signed)
Well controlled.  ?No changes to medicines. Continue Atorvastatin 80 mg take 1 tablet daily, Zetia 10 mg take 1 tablet daily, Lovaza 1 gm take 2 capsules by mouth BID. ?Continue to work on eating a healthy diet and exercise.  ?Labs drawn today.  ? ?

## 2021-12-03 NOTE — Assessment & Plan Note (Signed)
See above. Continue gabapentin. ?

## 2021-12-13 DIAGNOSIS — N5201 Erectile dysfunction due to arterial insufficiency: Secondary | ICD-10-CM | POA: Diagnosis not present

## 2021-12-13 DIAGNOSIS — R3915 Urgency of urination: Secondary | ICD-10-CM | POA: Diagnosis not present

## 2021-12-13 DIAGNOSIS — R351 Nocturia: Secondary | ICD-10-CM | POA: Diagnosis not present

## 2021-12-13 DIAGNOSIS — N401 Enlarged prostate with lower urinary tract symptoms: Secondary | ICD-10-CM | POA: Diagnosis not present

## 2021-12-13 DIAGNOSIS — C61 Malignant neoplasm of prostate: Secondary | ICD-10-CM | POA: Diagnosis not present

## 2021-12-21 ENCOUNTER — Ambulatory Visit (INDEPENDENT_AMBULATORY_CARE_PROVIDER_SITE_OTHER): Payer: HMO | Admitting: Family Medicine

## 2021-12-21 VITALS — BP 122/80 | HR 87 | Ht 69.0 in | Wt 208.6 lb

## 2021-12-21 DIAGNOSIS — Z0000 Encounter for general adult medical examination without abnormal findings: Secondary | ICD-10-CM

## 2021-12-21 DIAGNOSIS — Z1211 Encounter for screening for malignant neoplasm of colon: Secondary | ICD-10-CM

## 2021-12-21 DIAGNOSIS — C61 Malignant neoplasm of prostate: Secondary | ICD-10-CM | POA: Diagnosis not present

## 2021-12-21 NOTE — Progress Notes (Signed)
? ?Subjective:  ? Joshua Nelson is a 71 y.o. male who presents for Medicare Annual/Subsequent preventive examination.  This wellness visit is conducted by a nurse.  The patient's medications were reviewed and reconciled since the patient's last visit.  History details were provided by the patient.  The history appears to be reliable.   ? ?Medical History: Patient history and Family history was reviewed  ?Medications, Allergies, and preventative health maintenance was reviewed and updated. ? ? ?Cardiac Risk Factors include: advanced age (>49mn, >>81women);diabetes mellitus;dyslipidemia;hypertension;male gender;obesity (BMI >30kg/m2) ? ?   ?Objective:  ?  ?Today's Vitals  ? 12/21/21 1143  ?BP: 122/80  ?Pulse: 87  ?SpO2: 98%  ?Weight: 208 lb 9.6 oz (94.6 kg)  ?Height: '5\' 9"'$  (1.753 m)  ?PainSc: 0-No pain  ? ?Body mass index is 30.8 kg/m?. ? ? ?  12/21/2021  ? 12:12 PM 08/01/2021  ?  4:41 PM 07/26/2021  ? 11:19 AM 07/12/2020  ?  2:59 PM 12/07/2014  ?  5:10 PM 12/03/2014  ? 12:05 PM  ?Advanced Directives  ?Does Patient Have a Medical Advance Directive? No No No No Yes No  ?Type of AProduction managerof Attorney   ?Would patient like information on creating a medical advance directive? Yes (MAU/Ambulatory/Procedural Areas - Information given) No - Patient declined   No - patient declined information No - patient declined information  ? ? ?Current Medications (verified) ?Outpatient Encounter Medications as of 12/21/2021  ?Medication Sig  ? atorvastatin (LIPITOR) 80 MG tablet TAKE ONE (1) TABLET ONCE DAILY  ? Continuous Blood Gluc Sensor (FREESTYLE LIBRE 2 SENSOR) MISC E11.42 Change sensor every 10 days.  ? dapagliflozin propanediol (FARXIGA) 10 MG TABS tablet Take 1 tablet (10 mg total) by mouth daily. Pt takes in the am  ? Dulaglutide (TRULICITY) 4.5 MEH/6.3JSSOPN Inject 4.5 mg as directed once a week.  ? ezetimibe (ZETIA) 10 MG tablet TAKE ONE (1) TABLET ONCE DAILY  ? gabapentin (NEURONTIN) 300 MG  capsule TAKE ONE (1) CAPSULE THREE (3) TIMES EACH DAY  ? insulin aspart (FIASP FLEXTOUCH) 100 UNIT/ML FlexTouch Pen 10 U before breakfast and supper.  ? LANTUS SOLOSTAR 100 UNIT/ML Solostar Pen Inject 65 Units into the skin daily. Pt takes in the pm per wife  ? lisinopril (ZESTRIL) 5 MG tablet Once daily in the am  ? Meclizine HCl 25 MG CHEW TAKE 1 TABLET THREE TIMES DAILY AS NEEDED FOR DIZZINESS  ? metoprolol succinate (TOPROL-XL) 25 MG 24 hr tablet TAKE ONE (1) TABLET ONCE DAILY takes in am  ? cyclobenzaprine (FLEXERIL) 5 MG tablet Take 1 tablet (5 mg total) by mouth 3 (three) times daily as needed for muscle spasms. (Patient not taking: Reported on 12/21/2021)  ? HYDROcodone-acetaminophen (NORCO/VICODIN) 5-325 MG tablet Take 1 tablet by mouth every 6 (six) hours as needed for moderate pain. (Patient not taking: Reported on 12/21/2021)  ? omega-3 acid ethyl esters (LOVAZA) 1 g capsule Take 2 capsules (2 g total) by mouth 2 (two) times daily. (Patient not taking: Reported on 12/21/2021)  ? solifenacin (VESICARE) 10 MG tablet Take 10 mg by mouth daily. Takes at 1000 am daily  ? tamsulosin (FLOMAX) 0.4 MG CAPS capsule Take 0.8 mg by mouth at bedtime. (Patient not taking: Reported on 12/21/2021)  ? zolpidem (AMBIEN) 10 MG tablet Take 10 mg by mouth at bedtime.  (Patient not taking: Reported on 12/21/2021)  ? ?No facility-administered encounter medications on file as of 12/21/2021.  ? ? ?  Allergies (verified) ?Patient has no known allergies.  ? ?History: ?Past Medical History:  ?Diagnosis Date  ? Arthritis   ? Cerebrovascular accident (CVA) ([place]) 04/03/2018  ? Coronary artery disease   ? CVA (cerebral vascular accident) [facility])   ? Diabetes mellitus without complication ([place])   ? Hypertension   ? Insomnia   ? Lacunar infarction ([place]) 03/28/2018  ? Myocardial infarct [facility])   ? 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 PONV (postoperative nausea and vomiting)   ? Prostate cancer ([place]) 07/2021  ? ?Past Surgical History:  ?Procedure Laterality  Date  ? CYSTOSCOPY WITH URETHRAL DILATATION N/A 08/01/2021  ? Procedure: CYSTOSCOPY WITH BALLOON URETHRAL DILATATION;  Surgeon: Robley Fries, MD;  [facility];  Service: Urology;  Laterality: N/A;  ? heart stents    ? x 3  ? left cataract  10/2019  ? nose surgery for dog bite as a kid     ? right cataract Right 08/2019  ? TRANSURETHRAL RESECTION OF BLADDER TUMOR N/A 08/01/2021  ? Procedure: TRANSURETHRAL RESECTION OF PROSTATE;  Surgeon: Robley Fries, MD;  [facility];  Service: Urology;  Laterality: N/A;  90 MINS  ? ?Family History  ?Problem Relation Age of Onset  ? Endometrial cancer Mother   ? CAD Mother   ? AAA (abdominal aortic aneurysm) Mother   ? Healthy Father   ? Heart attack Brother   ? ?Social History  ? ?Socioeconomic History  ? Marital status: Married  ?  Spouse name: [name]  ? Number of children: 1  ? Years of education: 21  ? Highest education level: High school graduate  ?Occupational History  ? Occupation: Retired  ?Tobacco Use  ? Smoking status: Former  ?  Types: Cigarettes  ?  Quit date: 2003  ?  Years since quitting: 20.3  ? Smokeless tobacco: Never  ?Vaping Use  ? Vaping Use: Never used  ?Substance and Sexual Activity  ? Alcohol use: Not Currently  ?  Comment: none since 2000  ? Drug use: Never  ? Sexual activity: Not on file  ?Other Topics Concern  ? Not on file  ?Social History Narrative  ? Lives at home with his wife.  ? 2 cups caffeine per day.  ? Right-handed.  ? ?Social Determinants of Health  ? ?Financial Resource Strain: Medium Risk  ? Difficulty of Paying Living Expenses: Somewhat hard  ?Food Insecurity: No Food Insecurity  ? Worried About Charity fundraiser in the Last Year: Never true  ? Ran Out of Food in the Last Year: Never true  ?Transportation Needs: No Transportation Needs  ? Lack of Transportation (Medical): No  ? Lack of Transportation (Non-Medical): No  ?Physical Activity: Sufficiently Active  ? Days of Exercise per Week: 5 days  ? Minutes of Exercise per  Session: 30 min  ?Stress: Not on file  ?Social Connections: Not on file  ? ? ?Tobacco Counseling ?Counseling given: Not Answered ? ? ?Clinical Intake: ? ?Pre-visit preparation completed: Yes ? ?Pain : No/denies pain ?Pain Score: 0-No pain ? ?  ? ?BMI - recorded: 30.8 ?Nutritional Status: BMI > 30  Obese ?Nutritional Risks: None ?Diabetes: Yes (A1C 7.5) ?CBG done?: No ?Did pt. bring in CBG monitor from home?: No ? ?How often do you need to have someone help you when you read instructions, pamphlets, or other written materials from your doctor or pharmacy?: 1 - Never ?Interpreter Needed?: No ?  ?Activities of Daily Living ? ?  12/21/2021  ? 12:14  PM 08/01/2021  ?  4:31 PM  ?In your present state of health, do you have any difficulty performing the following activities:  ?Hearing? 0 0  ?Vision? 0 0  ?Difficulty concentrating or making decisions? 0 0  ?Walking or climbing stairs? 0 0  ?Dressing or bathing? 0 0  ?Doing errands, shopping? 0 0  ?Preparing Food and eating ? N   ?Using the Toilet? N   ?In the past six months, have you accidently leaked urine? N   ?Do you have problems with loss of bowel control? N   ?Managing your Medications? N   ?Managing your Finances? N   ?Housekeeping or managing your Housekeeping? N   ? ? ?Patient Care Team: ?CoxElnita Maxwell, MD as PCP - General (Family Medicine) ?Lane Hacker, [facility] (Pharmacist) ?Robley Fries, MD as Consulting Physician (Urology) ? ?Indicate any recent Medical Services you may have received from other than [facility] providers in the past year (date may be approximate). ? ?   ?Assessment:  ? This is a routine wellness examination for [facility]. ? ?Hearing/Vision screen ?No results found. ? ?Dietary issues and exercise activities discussed: ?Current Exercise Habits: The patient does not participate in regular exercise at present, Exercise limited by: None identified ? ?Depression Screen ? ?  12/21/2021  ? 11:49 AM 08/18/2021  ?  8:08 AM 04/25/2021  ?  8:35 AM 01/18/2021  ?  8:42  AM 10/17/2020  ?  8:39 AM 07/12/2020  ?  3:00 PM 06/28/2020  ?  8:10 AM  ?PHQ 2/9 Scores  ?PHQ - 2 Score 0 0 0 0 0 0 0  ?PHQ- 9 Score     0    ?  ?Fall Risk ? ?  12/21/2021  ? 12:13 PM 08/18/2021  ?  8:08 AM 11/

## 2021-12-21 NOTE — Patient Instructions (Signed)
Health Maintenance, Male Adopting a healthy lifestyle and getting preventive care are important in promoting health and wellness. Ask your health care provider about: The right schedule for you to have regular tests and exams. Things you can do on your own to prevent diseases and keep yourself healthy. What should I know about diet, weight, and exercise? Eat a healthy diet  Eat a diet that includes plenty of vegetables, fruits, low-fat dairy products, and lean protein. Do not eat a lot of foods that are high in solid fats, added sugars, or sodium. Maintain a healthy weight Body mass index (BMI) is a measurement that can be used to identify possible weight problems. It estimates body fat based on height and weight. Your health care provider can help determine your BMI and help you achieve or maintain a healthy weight. Get regular exercise Get regular exercise. This is one of the most important things you can do for your health. Most adults should: Exercise for at least 150 minutes each week. The exercise should increase your heart rate and make you sweat (moderate-intensity exercise). Do strengthening exercises at least twice a week. This is in addition to the moderate-intensity exercise. Spend less time sitting. Even light physical activity can be beneficial. Watch cholesterol and blood lipids Have your blood tested for lipids and cholesterol at 69 years of age, then have this test every 5 years. You may need to have your cholesterol levels checked more often if: Your lipid or cholesterol levels are high. You are older than 69 years of age. You are at high risk for heart disease. What should I know about cancer screening? Many types of cancers can be detected early and may often be prevented. Depending on your health history and family history, you may need to have cancer screening at various ages. This may include screening for: Colorectal cancer. Prostate cancer. Skin cancer. Lung  cancer. What should I know about heart disease, diabetes, and high blood pressure? Blood pressure and heart disease High blood pressure causes heart disease and increases the risk of stroke. This is more likely to develop in people who have high blood pressure readings or are overweight. Talk with your health care provider about your target blood pressure readings. Have your blood pressure checked: Every 3-5 years if you are 18-39 years of age. Every year if you are 40 years old or older. If you are between the ages of 65 and 75 and are a current or former smoker, ask your health care provider if you should have a one-time screening for abdominal aortic aneurysm (AAA). Diabetes Have regular diabetes screenings. This checks your fasting blood sugar level. Have the screening done: Once every three years after age 45 if you are at a normal weight and have a low risk for diabetes. More often and at a younger age if you are overweight or have a high risk for diabetes. What should I know about preventing infection? Hepatitis B If you have a higher risk for hepatitis B, you should be screened for this virus. Talk with your health care provider to find out if you are at risk for hepatitis B infection. Hepatitis C Blood testing is recommended for: Everyone born from 1945 through 1965. Anyone with known risk factors for hepatitis C. Sexually transmitted infections (STIs) You should be screened each year for STIs, including gonorrhea and chlamydia, if: You are sexually active and are younger than 69 years of age. You are older than 69 years of age and your   health care provider tells you that you are at risk for this type of infection. Your sexual activity has changed since you were last screened, and you are at increased risk for chlamydia or gonorrhea. Ask your health care provider if you are at risk. Ask your health care provider about whether you are at high risk for HIV. Your health care provider  may recommend a prescription medicine to help prevent HIV infection. If you choose to take medicine to prevent HIV, you should first get tested for HIV. You should then be tested every 3 months for as long as you are taking the medicine. Follow these instructions at home: Alcohol use Do not drink alcohol if your health care provider tells you not to drink. If you drink alcohol: Limit how much you have to 0-2 drinks a day. Know how much alcohol is in your drink. In the [place] one drink equals one 12 oz bottle of beer (355 mL), one 5 oz glass of wine (148 mL), or one 1 oz glass of hard liquor (44 mL). Lifestyle Do not use any products that contain nicotine or tobacco. These products include cigarettes, chewing tobacco, and vaping devices, such as e-cigarettes. If you need help quitting, ask your health care provider. Do not use street drugs. Do not share needles. Ask your health care provider for help if you need support or information about quitting drugs. General instructions Schedule regular health, dental, and eye exams. Stay current with your vaccines. Tell your health care provider if: You often feel depressed. You have ever been abused or do not feel safe at home. Summary Adopting a healthy lifestyle and getting preventive care are important in promoting health and wellness. Follow your health care provider's instructions about healthy diet, exercising, and getting tested or screened for diseases. Follow your health care provider's instructions on monitoring your cholesterol and blood pressure. This information is not intended to replace advice given to you by your health care provider. Make sure you discuss any questions you have with your health care provider. Document Revised: 12/19/2020 Document Reviewed: 12/19/2020 Elsevier Patient Education  2023 [employer]  

## 2021-12-25 ENCOUNTER — Ambulatory Visit: Payer: HMO | Admitting: Podiatry

## 2021-12-28 ENCOUNTER — Ambulatory Visit: Payer: HMO | Admitting: Podiatry

## 2021-12-28 ENCOUNTER — Encounter: Payer: Self-pay | Admitting: Podiatry

## 2021-12-28 DIAGNOSIS — E1142 Type 2 diabetes mellitus with diabetic polyneuropathy: Secondary | ICD-10-CM | POA: Diagnosis not present

## 2021-12-28 DIAGNOSIS — B351 Tinea unguium: Secondary | ICD-10-CM | POA: Diagnosis not present

## 2021-12-28 DIAGNOSIS — E1169 Type 2 diabetes mellitus with other specified complication: Secondary | ICD-10-CM

## 2022-01-02 ENCOUNTER — Telehealth: Payer: Self-pay

## 2022-01-02 NOTE — Chronic Care Management (AMB) (Unsigned)
Chronic Care Management Pharmacy Assistant   Name: Joshua Nelson  MRN: [id number] DOB: 02-16-1951  Reason for Encounter: Disease State/ Diabetes  Recent office visits:  12-21-2021 [name], MD. Cologuard ordered.  11-27-2021 [name], MD. INCREASE trulicity 3 mg weekly to 4.5 mg weekly.  Recent consult visits:  12-28-2021 Marzetta Board, DPM (Podiatry). Patient reported not taking flexeril, hydrocodone, omega 3, flomax and ambien.  Hospital visits:  None in previous 6 months  Medications: Outpatient Encounter Medications as of 01/02/2022  Medication Sig   atorvastatin (LIPITOR) 80 MG tablet TAKE ONE (1) TABLET ONCE DAILY   Continuous Blood Gluc Sensor (FREESTYLE LIBRE 2 SENSOR) MISC E11.42 Change sensor every 10 days.   dapagliflozin propanediol (FARXIGA) 10 MG TABS tablet Take 1 tablet (10 mg total) by mouth daily. Pt takes in the am   Dulaglutide (TRULICITY) 4.5 HE/5.2DP SOPN Inject 4.5 mg as directed once a week.   ezetimibe (ZETIA) 10 MG tablet TAKE ONE (1) TABLET ONCE DAILY   gabapentin (NEURONTIN) 300 MG capsule TAKE ONE (1) CAPSULE THREE (3) TIMES EACH DAY   insulin aspart (FIASP FLEXTOUCH) 100 UNIT/ML FlexTouch Pen 10 U before breakfast and supper.   LANTUS SOLOSTAR 100 UNIT/ML Solostar Pen Inject 65 Units into the skin daily. Pt takes in the pm per wife   lisinopril (ZESTRIL) 5 MG tablet Once daily in the am   Meclizine HCl 25 MG CHEW TAKE 1 TABLET THREE TIMES DAILY AS NEEDED FOR DIZZINESS   metoprolol succinate (TOPROL-XL) 25 MG 24 hr tablet TAKE ONE (1) TABLET ONCE DAILY takes in am   solifenacin (VESICARE) 10 MG tablet Take 10 mg by mouth daily. Takes at 1000 am daily   No facility-administered encounter medications on file as of 01/02/2022.  Recent Relevant Labs: Lab Results  Component Value Date/Time   HGBA1C 7.5 (H) 11/23/2021 10:25 AM   HGBA1C 8.7 (H) 07/26/2021 11:30 AM   MICROALBUR 150 08/18/2021 08:47 AM   MICROALBUR 10 10/17/2020 09:50 AM     Kidney Function Lab Results  Component Value Date/Time   CREATININE 0.85 11/23/2021 10:25 AM   CREATININE 0.87 07/26/2021 11:30 AM   GFRNONAA >60 07/26/2021 11:30 AM   GFRAA 105 06/28/2020 09:30 AM     Current antihyperglycemic regimen:  Farxiga 10 mg daily Lantus 65 units daily Fiasp 10 units daily Trulicity 4.5 mg weekly (waiting on PAP)   Patient verbally confirms he is taking the above medications as directed. Yes  What recent interventions/DTPs have been made to improve glycemic control:  Educated on A1c and blood sugar goals; -Counseled to check feet daily and get yearly eye exams  Have there been any recent hospitalizations or ED visits since last visit with CPP? No  Patient denies hypoglycemic symptoms  Patient denies hyperglycemic symptoms  How often are you checking your blood sugar? once daily  What are your blood sugars ranging?  Fasting: 120, 142 Before meals: None After meals: None Bedtime: None  On insulin? Yes How many units: 10, 65  During the week, how often does your blood glucose drop below 70? Never  Are you checking your feet daily/regularly? Yes  Adherence Review: Is the patient currently on a STATIN medication? Yes Is the patient currently on ACE/ARB medication? Yes Does the patient have >5 day gap between last estimated fill dates? Yes  Care Gaps: Last eye exam / Retinopathy Screening? None Last Annual Wellness Visit? 08-03-2021 Last Diabetic Foot Exam?  06-19-2021 Tdap overdue Shingrix overdue Colonoscopy  overdue Yearly ophthalmology overdue    Star Rating Drugs: Atorvastatin 80 mg- Last filled 07-18-2021 90 DS (Patient stated he has plenty supply) Trulicity 4.5 mg- Last filled 11-27-2021 90 DS (Patient did not pick up due to cost) Lisinopril 5 mg- Last filled 05-16-2021 90 DS (Patient stated he has plenty supply) Farxiga 10 mg- Last filled 12-06-2021 30 DS   [place] Clinical Pharmacist  Assistant [phone]

## 2022-01-04 ENCOUNTER — Other Ambulatory Visit: Payer: Self-pay

## 2022-01-04 DIAGNOSIS — E1142 Type 2 diabetes mellitus with diabetic polyneuropathy: Secondary | ICD-10-CM

## 2022-01-04 MED ORDER — FREESTYLE LIBRE 2 SENSOR MISC: 2 refills | Status: DC

## 2022-01-05 NOTE — Progress Notes (Signed)
  Subjective:  Patient ID: Joshua Nelson, male    DOB: 1951/05/20,  MRN: [id number]  Joshua Nelson presents to clinic today for at risk foot care with history of diabetic neuropathy and painful thick toenails that are difficult to trim. Pain interferes with ambulation. Aggravating factors include wearing enclosed shoe gear. Pain is relieved with periodic professional debridement.  Patient states blood glucose was 220 mg/dl today.  Last known HgA1c was 7.0%.  New problem(s): None.   PCP is Cox, Elnita Maxwell, MD , and last visit was Dec 21, 2021.  No Known Allergies  Review of Systems: Negative except as noted in the HPI.  Objective: No changes noted in today's physical examination. Objective:   Vascular Examination: Vascular status intact b/l with palpable pedal pulses. Pedal hair present b/l. CFT immediate b/l. No edema. No pain with calf compression b/l. Skin temperature gradient WNL b/l. No edema noted b/l LE.  Neurological Examination: Protective sensation diminished with 10g monofilament b/l.  Dermatological Examination: Pedal skin is warm and supple b/l LE. No open wounds b/l LE. No interdigital macerations noted b/l LE. Toenail L hallux mycotic with adequate length. No erythema, no edema, no drainage, no fluctuance. Toenails 2-5 bilaterally and R hallux elongated, discolored, dystrophic, thickened, and crumbly with subungual debris and tenderness to dorsal palpation.  Musculoskeletal Examination: Normal muscle strength 5/5 to all lower extremity muscle groups bilaterally. Hammertoe deformity noted 2-5 b/l.Marland Kitchen No pain, crepitus or joint limitation noted with ROM b/l LE.  Patient ambulates independently without assistive aids. Wearing diabetic shoes today.  Radiographs: None  Last A1c:      Latest Ref Rng & Units 11/23/2021   10:25 AM 07/26/2021   11:30 AM 04/25/2021    9:45 AM 01/18/2021    9:12 AM  Hemoglobin A1C  Hemoglobin-A1c 4.8 - 5.6 % 7.5   8.7   8.1   8.4       Assessment/Plan: 1. Onychomycosis of multiple toenails with type 2 diabetes mellitus and peripheral neuropathy ([place])     -Patient was evaluated and treated. All patient's and/or POA's questions/concerns answered on today's visit. -Continue diabetic foot care principles: inspect feet daily, monitor glucose as recommended by PCP and/or Endocrinologist, and follow prescribed diet per PCP, Endocrinologist and/or dietician. -Mycotic toenails 2-5 bilaterally and R hallux were debrided in length and girth with sterile nail nippers and dremel without iatrogenic bleeding. -Patient/POA to call should there be question/concern in the interim.   Return in about 3 months (around 03/30/2022).  Marzetta Board, DPM

## 2022-01-11 ENCOUNTER — Telehealth: Payer: Self-pay

## 2022-01-11 NOTE — Chronic Care Management (AMB) (Signed)
    Chronic Care Management Pharmacy Assistant   Name: Joshua Nelson  MRN: [id number] DOB: 01/15/1951  Reason for Encounter: Patient Assistance Coordination    Patient filled out [employer] application in office for Trulicity 4.5 mg once a week. [employer] is currently not accepting any new application for Trulicity. [employer] for updates, no updates on when they will start accepting new applications at this time. Sending message to PCP and Clinical Pharmacist for recommendations.   Medications: Outpatient Encounter Medications as of 01/11/2022  Medication Sig   atorvastatin (LIPITOR) 80 MG tablet TAKE ONE (1) TABLET ONCE DAILY   Continuous Blood Gluc Sensor (FREESTYLE LIBRE 2 SENSOR) MISC E11.42 Change sensor every 10 days.   dapagliflozin propanediol (FARXIGA) 10 MG TABS tablet Take 1 tablet (10 mg total) by mouth daily. Pt takes in the am   Dulaglutide (TRULICITY) 4.5 ID/7.8EU SOPN Inject 4.5 mg as directed once a week.   ezetimibe (ZETIA) 10 MG tablet TAKE ONE (1) TABLET ONCE DAILY   gabapentin (NEURONTIN) 300 MG capsule TAKE ONE (1) CAPSULE THREE (3) TIMES EACH DAY   insulin aspart (FIASP FLEXTOUCH) 100 UNIT/ML FlexTouch Pen 10 U before breakfast and supper.   LANTUS SOLOSTAR 100 UNIT/ML Solostar Pen Inject 65 Units into the skin daily. Pt takes in the pm per wife   lisinopril (ZESTRIL) 5 MG tablet Once daily in the am   Meclizine HCl 25 MG CHEW TAKE 1 TABLET THREE TIMES DAILY AS NEEDED FOR DIZZINESS   metoprolol succinate (TOPROL-XL) 25 MG 24 hr tablet TAKE ONE (1) TABLET ONCE DAILY takes in am   solifenacin (VESICARE) 10 MG tablet Take 10 mg by mouth daily. Takes at 1000 am daily   No facility-administered encounter medications on file as of 01/11/2022.   Pattricia Boss, [place] Pharmacist Assistant [phone]

## 2022-01-12 DIAGNOSIS — C61 Malignant neoplasm of prostate: Secondary | ICD-10-CM | POA: Diagnosis not present

## 2022-01-12 DIAGNOSIS — N3941 Urge incontinence: Secondary | ICD-10-CM | POA: Diagnosis not present

## 2022-01-12 DIAGNOSIS — N401 Enlarged prostate with lower urinary tract symptoms: Secondary | ICD-10-CM | POA: Diagnosis not present

## 2022-01-12 DIAGNOSIS — N5201 Erectile dysfunction due to arterial insufficiency: Secondary | ICD-10-CM | POA: Diagnosis not present

## 2022-01-12 NOTE — Telephone Encounter (Signed)
Yes we will start the application process for both Ozempic and [place]. Thank you  Pattricia Boss, [place] Pharmacist Assistant [phone]

## 2022-01-12 NOTE — Telephone Encounter (Signed)
Coordinated with team to look into Ozempic PAP. Will also start the process on Farxiga since that will also be needed soon most likely   Farxiga 10 mg- Last filled 12-06-2021 30 DS

## 2022-01-15 ENCOUNTER — Telehealth: Payer: Self-pay

## 2022-01-15 NOTE — Progress Notes (Signed)
    Chronic Care Management Pharmacy Assistant   Name: Joshua Nelson  MRN: [id number] DOB: [birth year]   Reason for Encounter: PAP  Ozempic/ Farxiga PAP has been uploaded and completed to be mailed out to pt to finish. Left a detailed message informing pt.     Medications: Outpatient Encounter Medications as of 01/15/2022  Medication Sig   atorvastatin (LIPITOR) 80 MG tablet TAKE ONE (1) TABLET ONCE DAILY   Continuous Blood Gluc Sensor (FREESTYLE LIBRE 2 SENSOR) MISC E11.42 Change sensor every 10 days.   dapagliflozin propanediol (FARXIGA) 10 MG TABS tablet Take 1 tablet (10 mg total) by mouth daily. Pt takes in the am   Dulaglutide (TRULICITY) 4.5 LK/5.6YB SOPN Inject 4.5 mg as directed once a week.   ezetimibe (ZETIA) 10 MG tablet TAKE ONE (1) TABLET ONCE DAILY   gabapentin (NEURONTIN) 300 MG capsule TAKE ONE (1) CAPSULE THREE (3) TIMES EACH DAY   insulin aspart (FIASP FLEXTOUCH) 100 UNIT/ML FlexTouch Pen 10 U before breakfast and supper.   LANTUS SOLOSTAR 100 UNIT/ML Solostar Pen Inject 65 Units into the skin daily. Pt takes in the pm per wife   lisinopril (ZESTRIL) 5 MG tablet Once daily in the am   Meclizine HCl 25 MG CHEW TAKE 1 TABLET THREE TIMES DAILY AS NEEDED FOR DIZZINESS   metoprolol succinate (TOPROL-XL) 25 MG 24 hr tablet TAKE ONE (1) TABLET ONCE DAILY takes in am   solifenacin (VESICARE) 10 MG tablet Take 10 mg by mouth daily. Takes at 1000 am daily   No facility-administered encounter medications on file as of 01/15/2022.    [name], [place] Pharmacist Assistant  [phone]

## 2022-01-18 ENCOUNTER — Telehealth: Payer: Self-pay

## 2022-01-18 NOTE — Chronic Care Management (AMB) (Signed)
    Chronic Care Management Pharmacy Assistant   Name: MOKSH LOOMER  MRN: [id number] DOB: [birth year]   Reason for Encounter: Patient Joshua Nelson is currently not accepting any new application for Trulicity. [employer] for updates, no updates on when they will start accepting new applications at this time. Sent advisement to [place] Constable and Dr Tobie Poet.  Per Dr Tobie Poet: Please discuss with patient and determine if can continue to get medication on his own.  Called patient and spoke with wife, she is aware that Trulicity is no longer accepting new applications and she would not be able to afford this medication, costing them $200 a month, and ok with choosing another option for patient. Also informed that we have 2 patient assistance application that are being mailed for Ozempic and Farxiga to see if we can get these medications at no cost. I will also pull up patient's Tier options from [employer] to share with Dr Tobie Poet and [place] Constable, Pharm D for other alternative medications to replace Trulicity. Wife aware and appreciates the help.  Checked patient's formulary coverage for Blood Glucose Regulators and per patient plan option HMO-CSNP all of these medications are a Tier 6 and notes: You won't pay more than $0 for a one-month supply of each insulin product covered by our plan, no matter what cost-sharing tier it's on, even if you haven't paid your deductible. [facility] where he last got his medication, pharmacy technician informed me that he is in a coverage gap, medication goes through insurance but still shows $227 copay. Sending update to providers for advisement.   Medications: Outpatient Encounter Medications as of 01/18/2022  Medication Sig   atorvastatin (LIPITOR) 80 MG tablet TAKE ONE (1) TABLET ONCE DAILY   Continuous Blood Gluc Sensor (FREESTYLE LIBRE 2 SENSOR) MISC E11.42 Change sensor every 10 days.   dapagliflozin propanediol  (FARXIGA) 10 MG TABS tablet Take 1 tablet (10 mg total) by mouth daily. Pt takes in the am   Dulaglutide (TRULICITY) 4.5 TR/1.7BV SOPN Inject 4.5 mg as directed once a week.   ezetimibe (ZETIA) 10 MG tablet TAKE ONE (1) TABLET ONCE DAILY   gabapentin (NEURONTIN) 300 MG capsule TAKE ONE (1) CAPSULE THREE (3) TIMES EACH DAY   insulin aspart (FIASP FLEXTOUCH) 100 UNIT/ML FlexTouch Pen 10 U before breakfast and supper.   LANTUS SOLOSTAR 100 UNIT/ML Solostar Pen Inject 65 Units into the skin daily. Pt takes in the pm per wife   lisinopril (ZESTRIL) 5 MG tablet Once daily in the am   Meclizine HCl 25 MG CHEW TAKE 1 TABLET THREE TIMES DAILY AS NEEDED FOR DIZZINESS   metoprolol succinate (TOPROL-XL) 25 MG 24 hr tablet TAKE ONE (1) TABLET ONCE DAILY takes in am   solifenacin (VESICARE) 10 MG tablet Take 10 mg by mouth daily. Takes at 1000 am daily   No facility-administered encounter medications on file as of 01/18/2022.    Pattricia Boss, [place] Pharmacist Assistant [phone]

## 2022-01-25 NOTE — Progress Notes (Signed)
Error

## 2022-02-05 ENCOUNTER — Other Ambulatory Visit: Payer: Self-pay

## 2022-02-05 ENCOUNTER — Ambulatory Visit (INDEPENDENT_AMBULATORY_CARE_PROVIDER_SITE_OTHER): Payer: HMO

## 2022-02-05 DIAGNOSIS — E1142 Type 2 diabetes mellitus with diabetic polyneuropathy: Secondary | ICD-10-CM

## 2022-02-05 DIAGNOSIS — E782 Mixed hyperlipidemia: Secondary | ICD-10-CM

## 2022-02-05 DIAGNOSIS — Z794 Long term (current) use of insulin: Secondary | ICD-10-CM

## 2022-02-05 MED ORDER — FREESTYLE LIBRE 2 SENSOR MISC: 2 refills | Status: DC

## 2022-02-05 NOTE — Progress Notes (Signed)
Chronic Care Management Pharmacy Note  02/05/2022 Name:  Joshua Nelson MRN:  [id number] DOB:  26-May-1951   Recommendations/Changes made from today's visit: -Patient didn't answer original call. He called me back 15 minutes later. Patient received Ozempic and Farxiga PAP at beginning of the month. They have not filled it out yet, they were under the impression they already did so. Counseled to bring in PAP ASAP so we can mail out   Subjective: Joshua Nelson is an 71 y.o. year old male who is a primary patient of Cox, Kirsten, MD.  The CCM team was consulted for assistance with disease management and care coordination needs.    Engaged with patient by telephone for follow up visit in response to provider referral for pharmacy case management and/or care coordination services.   Consent to Services:  The patient was given the following information about Chronic Care Management services today, agreed to services, and gave verbal consent: 1. CCM service includes personalized support from designated clinical staff supervised by the primary care provider, including individualized plan of care and coordination with other care providers 2. 24/7 contact phone numbers for assistance for urgent and routine care needs. 3. Service will only be billed when office clinical staff spend 20 minutes or more in a month to coordinate care. 4. Only one practitioner may furnish and bill the service in a calendar month. 5.The patient may stop CCM services at any time (effective at the end of the month) by phone call to the office staff. 6. The patient will be responsible for cost sharing (co-pay) of up to 20% of the service fee (after annual deductible is met). Patient agreed to services and consent obtained.  Patient Care Team: Blane Ohara, MD as PCP - General (Family Medicine) Zettie Pho, [facility] (Pharmacist) Noel Christmas, MD as Consulting Physician (Urology)  Recent office visits:  12-21-2021 Blane Ohara, MD. Cologuard ordered.   11-27-2021 Blane Ohara, MD. INCREASE trulicity 3 mg weekly to 4.5 mg weekly.   Recent consult visits:  12-28-2021 Freddie Breech, DPM (Podiatry). Patient reported not taking flexeril, hydrocodone, omega 3, flomax and ambien.   Hospital visits:  None in previous 6 months   Objective:  Lab Results  Component Value Date   CREATININE 0.85 11/23/2021   BUN 12 11/23/2021   GFRNONAA >60 07/26/2021   GFRAA 105 06/28/2020   NA 145 (H) 11/23/2021   K 4.9 11/23/2021   CALCIUM 9.8 11/23/2021   CO2 26 11/23/2021   GLUCOSE 111 (H) 11/23/2021    Lab Results  Component Value Date/Time   HGBA1C 7.5 (H) 11/23/2021 10:25 AM   HGBA1C 8.7 (H) 07/26/2021 11:30 AM   MICROALBUR 150 08/18/2021 08:47 AM   MICROALBUR 10 10/17/2020 09:50 AM    Last diabetic Eye exam:  Lab Results  Component Value Date/Time   HMDIABEYEEXA No Retinopathy 09/25/2019 12:00 AM    Last diabetic Foot exam: No results found for: "HMDIABFOOTEX"   Lab Results  Component Value Date   CHOL 123 11/23/2021   HDL 34 (L) 11/23/2021   LDLCALC 74 11/23/2021   TRIG 73 11/23/2021   CHOLHDL 3.6 11/23/2021       Latest Ref Rng & Units 11/23/2021   10:25 AM 04/25/2021    9:45 AM 01/18/2021    9:12 AM  Hepatic Function  Total Protein 6.0 - 8.5 g/dL 7.0  6.7  6.8   Albumin 3.8 - 4.8 g/dL 4.5  4.5  4.5   AST  0 - 40 IU/L 17  17  17    ALT 0 - 44 IU/L 18  16  18    Alk Phosphatase 44 - 121 IU/L 80  61  63   Total Bilirubin 0.0 - 1.2 mg/dL 0.7  0.8  0.6     Lab Results  Component Value Date/Time   TSH 0.687 06/28/2020 09:30 AM       Latest Ref Rng & Units 11/23/2021   10:25 AM 07/26/2021   11:30 AM 04/25/2021    9:45 AM  CBC  WBC 3.4 - 10.8 x10E3/uL 8.9  11.0  8.8   Hemoglobin 13.0 - 17.7 g/dL 82.9  56.2  13.0   Hematocrit 37.5 - 51.0 % 44.0  41.5  38.9   Platelets 150 - 450 x10E3/uL 186  185  193     No results found for: "VD25OH"  Clinical ASCVD: Yes  The ASCVD Risk score  (Arnett DK, et al., 2019) failed to calculate for the following reasons:   The patient has a prior MI or stroke diagnosis       12/21/2021   11:49 AM 08/18/2021    8:08 AM 04/25/2021    8:35 AM  Depression screen PHQ 2/9  Decreased Interest 0 0 0  Down, Depressed, Hopeless 0 0 0  PHQ - 2 Score 0 0 0     Other: (CHADS2VASc if Afib, MMRC or CAT for COPD, ACT, DEXA)  Social History   Tobacco Use  Smoking Status Former   Types: Cigarettes   Quit date: 2003   Years since quitting: 20.4  Smokeless Tobacco Never   BP Readings from Last 3 Encounters:  12/21/21 122/80  11/27/21 122/78  09/18/21 130/84   Pulse Readings from Last 3 Encounters:  12/21/21 87  11/27/21 78  09/18/21 94   Wt Readings from Last 3 Encounters:  12/21/21 208 lb 9.6 oz (94.6 kg)  11/27/21 202 lb (91.6 kg)  09/18/21 204 lb (92.5 kg)   BMI Readings from Last 3 Encounters:  12/21/21 30.80 kg/m  11/27/21 29.83 kg/m  09/18/21 30.13 kg/m    Assessment/Interventions: Review of patient past medical history, allergies, medications, health status, including review of consultants reports, laboratory and other test data, was performed as part of comprehensive evaluation and provision of chronic care management services.   SDOH:  (Social Determinants of Health) assessments and interventions performed: Yes SDOH Interventions    Flowsheet Row Most Recent Value  SDOH Interventions   Financial Strain Interventions Other (Comment)  [PAP (See CP)]  Transportation Interventions Intervention Not Indicated      SDOH Screenings   Alcohol Screen: Low Risk  (12/21/2021)   Alcohol Screen    Last Alcohol Screening Score (AUDIT): 0  Depression (PHQ2-9): Low Risk  (12/21/2021)   Depression (PHQ2-9)    PHQ-2 Score: 0  Financial Resource Strain: High Risk (02/05/2022)   Overall Financial Resource Strain (CARDIA)    Difficulty of Paying Living Expenses: Very hard  Food Insecurity: No Food Insecurity (12/21/2021)   Hunger  Vital Sign    Worried About Running Out of Food in the Last Year: Never true    Ran Out of Food in the Last Year: Never true  Housing: Low Risk  (12/21/2021)   Housing    Last Housing Risk Score: 0  Physical Activity: Sufficiently Active (12/21/2021)   Exercise Vital Sign    Days of Exercise per Week: 5 days    Minutes of Exercise per Session: 30 min  Social Connections:  Not on file  Stress: Not on file  Tobacco Use: Medium Risk (12/28/2021)   Patient History    Smoking Tobacco Use: Former    Smokeless Tobacco Use: Never    Passive Exposure: Not on file  Transportation Needs: No Transportation Needs (02/05/2022)   PRAPARE - Transportation    Lack of Transportation (Medical): No    Lack of Transportation (Non-Medical): No    CCM Care Plan  No Known Allergies  Medications Reviewed Today     Reviewed by Zettie Pho, [facility] (Pharmacist) on 02/05/22 at 1343  Med List Status: <None>   Medication Order Taking? Sig Documenting Provider Last Dose Status Informant  atorvastatin (LIPITOR) 80 MG tablet [id number]  TAKE ONE (1) TABLET ONCE DAILY Abigail Miyamoto, MD  Active Spouse/Significant Other  Continuous Blood Gluc Sensor (FREESTYLE LIBRE 2 SENSOR) MISC [id number]  [id number] Change sensor every 10 days. Cox, Kirsten, MD  Active   dapagliflozin propanediol (FARXIGA) 10 MG TABS tablet [id number]  Take 1 tablet (10 mg total) by mouth daily. Pt takes in the am Cox, Fritzi Mandes, MD  Active   Dulaglutide (TRULICITY) 4.5 MG/0.5ML SOPN [id number]  Inject 4.5 mg as directed once a week. Cox, Kirsten, MD  Active   ezetimibe (ZETIA) 10 MG tablet [id number]  TAKE ONE (1) TABLET ONCE DAILY Cox, Kirsten, MD  Active Spouse/Significant Other  gabapentin (NEURONTIN) 300 MG capsule [id number]  TAKE ONE (1) CAPSULE THREE (3) TIMES EACH DAY Cox, Kirsten, MD  Active   insulin aspart (FIASP FLEXTOUCH) 100 UNIT/ML FlexTouch Pen [id number]  10 U before breakfast and supper. Cox, Kirsten, MD  Active   LANTUS SOLOSTAR  100 UNIT/ML Solostar Pen [id number]  Inject 65 Units into the skin daily. Pt takes in the pm per wife Cox, Kirsten, MD  Active   lisinopril (ZESTRIL) 5 MG tablet [id number]  Once daily in the am Cox, Fritzi Mandes, MD  Active   Meclizine HCl 25 MG CHEW [id number]  TAKE 1 TABLET THREE TIMES DAILY AS NEEDED FOR DIZZINESS Cox, Kirsten, MD  Active Spouse/Significant Other  metoprolol succinate (TOPROL-XL) 25 MG 24 hr tablet [id number]  TAKE ONE (1) TABLET ONCE DAILY takes in am Cox, Fritzi Mandes, MD  Active   solifenacin (VESICARE) 10 MG tablet [id number]  Take 10 mg by mouth daily. Takes at 1000 am daily [provider]  Active Spouse/Significant Other            Patient Active Problem List   Diagnosis Date Noted   Controlled type 2 diabetes mellitus with complication, with long-term current use of insulin (HCC) 12/03/2021   Atherosclerosis of aorta (HCC) 12/03/2021   Prostate cancer (HCC) 08/08/2021   BPH with obstruction/lower urinary tract symptoms 08/01/2021   Ulcer of left foot (HCC) 06/14/2021   Preoperative general physical examination 05/01/2021   Mixed hyperlipidemia 11/30/2019   Diabetic polyneuropathy (HCC) 11/30/2019   Hypertensive heart disease without heart failure 11/30/2019   Chronic left shoulder pain 11/30/2019   Obstructive sleep apnea 04/03/2018   Atherosclerosis of native coronary artery of native heart without angina pectoris 03/28/2018   Old myocardial infarction 03/28/2018    Immunization History  Administered Date(s) Administered   Fluad Quad(high Dose 65+) 06/28/2020, 04/25/2021   PFIZER(Purple Top)SARS-COV-2 Vaccination 12/10/2019, 01/08/2020   PNEUMOCOCCAL CONJUGATE-20 05/26/2021   Pneumococcal Polysaccharide-23 06/28/2020    Conditions to be addressed/monitored:  Hypertension, Hyperlipidemia, and Diabetes  Care Plan : CCM Pharmacy Care Plan  Updates made by Zettie Pho, RPH since 02/05/2022 12:00 AM  Problem: DM, Lipids, Pain, HTN   Priority:  High  Onset Date: 08/24/2021     Long-Range Goal: Disease State Management   Start Date: 08/24/2021  Expected End Date: 08/24/2022  Recent Progress: On track  Priority: High  Note:   Current Barriers:  Does not contact provider office for questions/concerns  Pharmacist Clinical Goal(s):  Patient will contact provider office for questions/concerns as evidenced notation of same in electronic health record through collaboration with PharmD and provider.   Interventions: 1:1 collaboration with Cox, Fritzi Mandes, MD regarding development and update of comprehensive plan of care as evidenced by provider attestation and co-signature Inter-disciplinary care team collaboration (see longitudinal plan of care) Comprehensive medication review performed; medication list updated in electronic medical record  Hypertension (BP goal <140/90) BP Readings from Last 3 Encounters:  12/21/21 122/80  11/27/21 122/78  09/18/21 130/84  -Controlled -Current treatment: Lisinopril 5mg  QD Appropriate, Effective, Safe, Accessible Metoprolol Succ 25mg  QD Appropriate, Effective, Safe, Accessible -Medications previously tried: N/A  -Current home readings: Doesn't test -Current dietary habits: "Tries to eat healthy" -Current exercise habits: None -Denies hypotensive/hypertensive symptoms -Educated on BP goals and benefits of medications for prevention of heart attack, stroke and kidney damage; -Counseled to monitor BP at home PRN, document, and provide log at future appointments -Recommended to continue current medication  Hyperlipidemia: (LDL goal < 70) The ASCVD Risk score (Arnett DK, et al., 2019) failed to calculate for the following reasons:   The patient has a prior MI or stroke diagnosis Lab Results  Component Value Date   CHOL 123 11/23/2021   CHOL 199 08/18/2021   CHOL 140 04/25/2021   Lab Results  Component Value Date   HDL 34 (L) 11/23/2021   HDL 27 (L) 08/18/2021   HDL 26 (L) 04/25/2021   Lab  Results  Component Value Date   LDLCALC 74 11/23/2021   LDLCALC 143 (H) 08/18/2021   LDLCALC 93 04/25/2021   Lab Results  Component Value Date   TRIG 73 11/23/2021   TRIG 159 (H) 08/18/2021   TRIG 113 04/25/2021   Lab Results  Component Value Date   CHOLHDL 3.6 11/23/2021   CHOLHDL 7.4 (H) 08/18/2021   CHOLHDL 5.4 (H) 04/25/2021  No results found for: "LDLDIRECT" -Uncontrolled -Current treatment: Atorvastatin 80mg  Appropriate, Query effective, Query Safe, Accessible -Medications previously tried: N/A  -Current dietary patterns: "Tries to eat healthy" -Current exercise habits: None -Educated on Cholesterol goals;  Jan 2023: Main priority was Josephine Igo today, will talk about statin compliance next week  Diabetes (A1c goal <8%) Lab Results  Component Value Date   HGBA1C 7.5 (H) 11/23/2021   HGBA1C 8.7 (H) 07/26/2021   HGBA1C 8.1 (H) 04/25/2021   Lab Results  Component Value Date   MICROALBUR 150 08/18/2021   LDLCALC 74 11/23/2021   CREATININE 0.85 11/23/2021   Lab Results  Component Value Date   NA 145 (H) 11/23/2021   K 4.9 11/23/2021   CREATININE 0.85 11/23/2021   EGFR 95 11/23/2021   GFRNONAA >60 07/26/2021   GLUCOSE 111 (H) 11/23/2021   Lab Results  Component Value Date   WBC 8.9 11/23/2021   HGB 14.5 11/23/2021   HCT 44.0 11/23/2021   MCV 88 11/23/2021   PLT 186 11/23/2021  -Uncontrolled -Current medications: Libre Appropriate, Query effective, Safe, Query accessible Dapagliflozin 10mg  Appropriate, Query effective, Safe, Query accessible Trulicity 4.5mg /week Appropriate, Query effective, Safe, Query accessible ASPART SS Appropriate, Query effective, Safe, Query accessible -Medications previously tried: None  -Current home glucose  readings fasting glucose:  Jan 2023: Doesn't test, doesn't have meter/Libre post prandial glucose:  -Denies hypoglycemic/hyperglycemic symptoms -Current exercise: None -Educated on A1c and blood sugar goals; -Counseled to  check feet daily and get yearly eye exams Jan 2023: Main priority was getting [place]. Spoke with wife, will call  [phone], [facility], to see status of Josephine Igo. Will f/u next week to hear what next step is. Patient unable to get Trulicity 4.5mg /week, had to get 3.0mg  pen from Rx 09/01/21: They called [facility] and was told they only have Decomm in stock and need a new script. Wife did not call me and has been waiting for script to be sent in. She came to office and picked up [place] sample to last until [facility], but they haven't started using it yet. Will get DEXCOMM sent ASAP June 2023: PAP for Ozempic and Dapagliflozin sent to patient at beginning of the month. They received it and thought it was the Trulicity PAP that they got last month so never called or finished it. Counseled them on the difference and to call if you have questions. Patient originally didn't answer phone for visit today, had to call his wife. Then patient called back. Counseled they have to bring in ASAP for [place] to send out  Chronic Pain -Controlled -Current treatment: Gabapentin 300mg  QD Query Appropriate, Query effective, Safe, Accessible Hydrocodone/APAP 5/325 Query Appropriate, Query effective, Safe, Accessible IBU 800mg  TID PRN Appropriate, Effective, Safe, Accessible -Medications previously tried: None  -Pain Scale There were no vitals filed for this visit.  Aggravating Factors: Movement  Pain Type: Patient didn't specify  Jan 2023: Main priority was Josephine Igo, will go over pain at f/u June 2023: Main priority was cost of meds   Insomnia (Goal: 7-8 hours of sleep/night) -Not ideally controlled -Patient currently getting unknown hours of sleep/night -Patient currently waking up unknown times/night -Current treatment: Zolpidem 10mg  HS Query Appropriate, Query effective, Query Safe, Query accessible -Medications previously tried: N/A -Counseled on sleep hygiene techniques (consistent sleep/wake up schedule, no electronic  screens 1-2 hours before bed, etc)   BPH (Goal: improve urination) -Not ideally controlled -Night time urination frequency: unknown/night -Current treatment: Tamsulosin 0.8mg  HS Appropriate, Query effective, Safe, Accessible -Medications previously tried: None -Counseled on non-pharmacologic techniques such as Kegels Jan 2023: Main priority was Josephine Igo, will go over BPHat f/u June 2023: Unable to conduct IPSS today. Patient didn't answer original phone call and ran out of time  Patient Goals/Self-Care Activities Patient will:  - take medications as prescribed as evidenced by patient report and record review  Follow Up Plan: The patient has been provided with contact information for the care management team and has been advised to call with any health related questions or concerns.   CPP F/U August 2023   Artelia Laroche, Ilda Basset.D. - [phone]        Medication Assistance: None required.  Patient affirms current coverage meets needs.  Compliance/Adherence/Medication fill history: Care Gaps: Last eye exam / Retinopathy Screening? None Last Annual Wellness Visit? 08-03-2021 Last Diabetic Foot Exam?  06-19-2021 Tdap overdue Shingrix overdue Colonoscopy overdue Yearly ophthalmology overdue     Star Rating Drugs: Atorvastatin 80 mg- Last filled 07-18-2021 90 DS (Patient stated he has plenty supply) Trulicity 4.5 mg- Last filled 11-27-2021 90 DS (Patient did not pick up due to cost) Lisinopril 5 mg- Last filled 05-16-2021 90 DS (Patient stated he has plenty supply) Farxiga 10 mg- Last filled 12-06-2021 30 DS   Patient's preferred pharmacy is:  [facility] - [place], [place] - [address] [place] [address] Phone: [phone] Fax: [phone]  [facility]) - [place], [place] - [address] [place] [address] Phone: [phone] Fax: [phone]  [facility] - [place], [place] -  [address] [place] [address] Phone: [phone] Fax: [phone]  Uses pill box? No -   Pt endorses 100% compliance  We discussed: Benefits of medication synchronization, packaging and delivery as well as enhanced pharmacist oversight with [facility]. Patient decided to: Continue current medication management strategy  Care Plan and Follow Up Patient Decision:  Patient agrees to Care Plan and Follow-up.  Plan: The patient has been provided with contact information for the care management team and has been advised to call with any health related questions or concerns.   CPP F/U August 2023  Artelia Laroche, Ilda Basset.D. - [phone]

## 2022-02-09 DIAGNOSIS — E1159 Type 2 diabetes mellitus with other circulatory complications: Secondary | ICD-10-CM

## 2022-02-09 DIAGNOSIS — I10 Essential (primary) hypertension: Secondary | ICD-10-CM | POA: Diagnosis not present

## 2022-02-09 DIAGNOSIS — Z7984 Long term (current) use of oral hypoglycemic drugs: Secondary | ICD-10-CM

## 2022-02-09 DIAGNOSIS — N400 Enlarged prostate without lower urinary tract symptoms: Secondary | ICD-10-CM | POA: Diagnosis not present

## 2022-02-09 DIAGNOSIS — E785 Hyperlipidemia, unspecified: Secondary | ICD-10-CM | POA: Diagnosis not present

## 2022-02-09 DIAGNOSIS — Z7985 Long-term (current) use of injectable non-insulin antidiabetic drugs: Secondary | ICD-10-CM

## 2022-02-15 ENCOUNTER — Other Ambulatory Visit: Payer: Self-pay | Admitting: Family Medicine

## 2022-02-15 ENCOUNTER — Telehealth: Payer: Self-pay

## 2022-02-16 ENCOUNTER — Telehealth: Payer: Self-pay

## 2022-02-16 NOTE — Chronic Care Management (AMB) (Signed)
Faxed patient assistance application for [place] to [employer] patient assistance program.  Patient assistance application for Ozempic 0.5 mg and Fiasp 20 units signed by provider but awaiting patient income documentation and missing signature, task assigned to [name], CMA to reach out to patient for items.   Pattricia Boss, [place] Pharmacist Assistant [phone]

## 2022-02-16 NOTE — Progress Notes (Signed)
    Chronic Care Management Pharmacy Assistant   Name: Joshua Nelson  MRN: [id number] DOB: May 12, 1951   Reason for Encounter: PAP  02/16/22- Called pt today to let him know he needs to bring his income for him and his wife and sign a page on the application that was missed. I had to leave a detailed message informing pt.    02/19/22-Spoke with wife and she received my message and will bring W2s and will come by and sign the application.  Medications: Outpatient Encounter Medications as of 02/16/2022  Medication Sig   atorvastatin (LIPITOR) 80 MG tablet TAKE ONE (1) TABLET ONCE DAILY   Continuous Blood Gluc Sensor (FREESTYLE LIBRE 2 SENSOR) MISC E11.42 Change sensor every 10 days.   dapagliflozin propanediol (FARXIGA) 10 MG TABS tablet Take 1 tablet (10 mg total) by mouth daily. Pt takes in the am (Patient not taking: Reported on 02/05/2022)   Dulaglutide (TRULICITY) 4.5 EF/0.0FH SOPN Inject 4.5 mg as directed once a week. (Patient not taking: Reported on 02/05/2022)   ezetimibe (ZETIA) 10 MG tablet TAKE ONE (1) TABLET ONCE DAILY   gabapentin (NEURONTIN) 300 MG capsule TAKE ONE (1) CAPSULE THREE (3) TIMES EACH DAY   insulin aspart (FIASP FLEXTOUCH) 100 UNIT/ML FlexTouch Pen 10 U before breakfast and supper.   LANTUS SOLOSTAR 100 UNIT/ML Solostar Pen Inject 65 Units into the skin daily. Pt takes in the pm per wife (Patient not taking: Reported on 02/05/2022)   lisinopril (ZESTRIL) 5 MG tablet Once daily in the am   Meclizine HCl 25 MG CHEW TAKE 1 TABLET THREE TIMES DAILY AS NEEDED FOR DIZZINESS   metoprolol succinate (TOPROL-XL) 25 MG 24 hr tablet TAKE ONE (1) TABLET ONCE DAILY takes in am   solifenacin (VESICARE) 10 MG tablet Take 10 mg by mouth daily. Takes at 1000 am daily   No facility-administered encounter medications on file as of 02/16/2022.    [name], [place] Pharmacist Assistant  [phone]

## 2022-02-22 ENCOUNTER — Other Ambulatory Visit: Payer: Self-pay

## 2022-02-22 MED ORDER — LANTUS SOLOSTAR 100 UNIT/ML ~~LOC~~ SOPN: 65.0000 [IU] | PEN_INJECTOR | Freq: Every day | SUBCUTANEOUS | 1 refills | Status: DC

## 2022-02-22 MED ORDER — GABAPENTIN 300 MG PO CAPS: ORAL_CAPSULE | ORAL | 1 refills | Status: DC

## 2022-03-01 ENCOUNTER — Telehealth: Payer: Self-pay

## 2022-03-01 NOTE — Chronic Care Management (AMB) (Signed)
[employer] patient assistance application for Fiasp 364 u/ml and Ozempic 0.5 mg.  Joshua Nelson, [place] Pharmacist Assistant [phone]

## 2022-03-05 ENCOUNTER — Other Ambulatory Visit: Payer: HMO

## 2022-03-08 ENCOUNTER — Ambulatory Visit: Payer: HMO | Admitting: Family Medicine

## 2022-03-27 ENCOUNTER — Telehealth: Payer: HMO

## 2022-03-29 ENCOUNTER — Telehealth: Payer: Self-pay

## 2022-03-29 NOTE — Progress Notes (Signed)
I called pt today and left a message informing that his PAP denied Fiasp and Ozempic due to needing a copy of his most recent federal tax returns.    Joshua Nelson, [place] Pharmacist Assistant  [phone]

## 2022-04-01 ENCOUNTER — Other Ambulatory Visit: Payer: Self-pay | Admitting: Family Medicine

## 2022-04-02 ENCOUNTER — Other Ambulatory Visit: Payer: HMO

## 2022-04-02 DIAGNOSIS — Z794 Long term (current) use of insulin: Secondary | ICD-10-CM | POA: Diagnosis not present

## 2022-04-02 DIAGNOSIS — E118 Type 2 diabetes mellitus with unspecified complications: Secondary | ICD-10-CM | POA: Diagnosis not present

## 2022-04-02 DIAGNOSIS — I119 Hypertensive heart disease without heart failure: Secondary | ICD-10-CM

## 2022-04-02 DIAGNOSIS — E782 Mixed hyperlipidemia: Secondary | ICD-10-CM

## 2022-04-03 ENCOUNTER — Encounter: Payer: Self-pay | Admitting: Family Medicine

## 2022-04-03 LAB — CBC WITH DIFF/PLATELET
Basophils Absolute: 0.1 10*3/uL (ref 0.0–0.2)
Basos: 1 %
EOS (ABSOLUTE): 0.3 10*3/uL (ref 0.0–0.4)
Eos: 4 %
Hematocrit: 45.1 % (ref 37.5–51.0)
Hemoglobin: 14.5 g/dL (ref 13.0–17.7)
Immature Grans (Abs): 0 10*3/uL (ref 0.0–0.1)
Immature Granulocytes: 1 %
Lymphocytes Absolute: 1.8 10*3/uL (ref 0.7–3.1)
Lymphs: 23 %
MCH: 28.3 pg (ref 26.6–33.0)
MCHC: 32.2 g/dL (ref 31.5–35.7)
MCV: 88 fL (ref 79–97)
Monocytes Absolute: 0.6 10*3/uL (ref 0.1–0.9)
Monocytes: 7 %
Neutrophils Absolute: 5.1 10*3/uL (ref 1.4–7.0)
Neutrophils: 64 %
Platelets: 185 10*3/uL (ref 150–450)
RBC: 5.13 x10E6/uL (ref 4.14–5.80)
RDW: 13.1 % (ref 11.6–15.4)
WBC: 7.9 10*3/uL (ref 3.4–10.8)

## 2022-04-03 LAB — COMPREHENSIVE METABOLIC PANEL
ALT: 16 IU/L (ref 0–44)
AST: 12 IU/L (ref 0–40)
Albumin/Globulin Ratio: 1.8 (ref 1.2–2.2)
Albumin: 4.5 g/dL (ref 3.9–4.9)
Alkaline Phosphatase: 78 IU/L (ref 44–121)
BUN/Creatinine Ratio: 18 (ref 10–24)
BUN: 16 mg/dL (ref 8–27)
Bilirubin Total: 0.5 mg/dL (ref 0.0–1.2)
CO2: 24 mmol/L (ref 20–29)
Calcium: 9.7 mg/dL (ref 8.6–10.2)
Chloride: 104 mmol/L (ref 96–106)
Creatinine, Ser: 0.9 mg/dL (ref 0.76–1.27)
Globulin, Total: 2.5 g/dL (ref 1.5–4.5)
Glucose: 127 mg/dL — ABNORMAL HIGH (ref 70–99)
Potassium: 4.5 mmol/L (ref 3.5–5.2)
Sodium: 143 mmol/L (ref 134–144)
Total Protein: 7 g/dL (ref 6.0–8.5)
eGFR: 92 mL/min/{1.73_m2} (ref >59)

## 2022-04-03 LAB — LIPID PANEL
Chol/HDL Ratio: 5.2 ratio — ABNORMAL HIGH (ref 0.0–5.0)
Cholesterol, Total: 209 mg/dL — ABNORMAL HIGH (ref 100–199)
HDL: 40 mg/dL (ref >39)
LDL Chol Calc (NIH): 146 mg/dL — ABNORMAL HIGH (ref 0–99)
Triglycerides: 126 mg/dL (ref 0–149)
VLDL Cholesterol Cal: 23 mg/dL (ref 5–40)

## 2022-04-03 LAB — HEMOGLOBIN A1C
Est. average glucose Bld gHb Est-mCnc: 192 mg/dL
Hgb A1c MFr Bld: 8.3 % — ABNORMAL HIGH (ref 4.8–5.6)

## 2022-04-03 LAB — CARDIOVASCULAR RISK ASSESSMENT

## 2022-04-03 NOTE — Progress Notes (Signed)
Blood count normal.  Liver function normal.  Kidney function normal.  Cholesterol: LDL worsened. Too high.  HBA1C: iincreased to 8.3.  Appt this coming week.

## 2022-04-04 ENCOUNTER — Encounter: Payer: Self-pay | Admitting: Pharmacist

## 2022-04-04 DIAGNOSIS — Z9229 Personal history of other drug therapy: Secondary | ICD-10-CM

## 2022-04-04 NOTE — Progress Notes (Signed)
[place])                                            [place] Team                                        Statin Quality Measure Assessment    04/04/2022  Joshua Nelson 1951-05-27 [id number]     Per review of chart and payor information, patient has a diagnosis of diabetes but is not currently filling a statin prescription.  This places patient into the SUPD (Statin Use In Patients with Diabetes) measure for CMS.    Atorvastatin 20 mg was filled last 12/22. Upcoming appointment 04/05/22. If deemed therapeutically appropriate, statin therapy could be assessed during the visit.  If patient should continue statin therapy, a new prescription should be sent to his Pharmacy  If patient experienced statin intolerance, a statin exclusion code could be associated with the visit (which would remove the patient from the measure).  The ASCVD Risk score (Arnett DK, et al., 2019) failed to calculate for the following reasons:   The patient has a prior MI or stroke diagnosis 04/02/2022     Component Value Date/Time   CHOL 209 (H) 04/02/2022 0837   TRIG 126 04/02/2022 0837   HDL 40 04/02/2022 0837   CHOLHDL 5.2 (H) 04/02/2022 0837   LDLCALC 146 (H) 04/02/2022 0837    Please consider ONE of the following recommendations:  Initiate high intensity statin Atorvastatin '40mg'$  once daily, #90, 3 refills   Rosuvastatin '20mg'$  once daily, #90, 3 refills    Initiate moderate intensity          statin with reduced frequency if prior          statin intolerance 1x weekly, #13, 3 refills   2x weekly, #26, 3 refills   3x weekly, #39, 3 refills    Code for past statin intolerance or  other exclusions (required annually)   Provider Requirements:  Associate code during an office visit or telehealth encounter  Drug Induced Myopathy G72.0   Myopathy, unspecified G72.9   Myositis, unspecified M60.9   Rhabdomyolysis T51.76   Alcoholic fatty  liver H60.7   Cirrhosis of liver K74.69   Prediabetes R73.03   PCOS E28.2   Toxic liver disease, unspecified K71.9   Adverse effect of antihyperlipidemic and antiarteriosclerotic drugs, initial encounter P71.0G2I    Plan:  Route note to PCP prior to upcoming appointment.   Elayne Guerin, PharmD, [place] Clinical Pharmacist [phone]

## 2022-04-05 ENCOUNTER — Ambulatory Visit (INDEPENDENT_AMBULATORY_CARE_PROVIDER_SITE_OTHER): Payer: HMO | Admitting: Family Medicine

## 2022-04-05 ENCOUNTER — Ambulatory Visit: Payer: HMO | Admitting: Podiatry

## 2022-04-05 ENCOUNTER — Encounter: Payer: Self-pay | Admitting: Family Medicine

## 2022-04-05 VITALS — BP 120/64 | HR 88 | Temp 97.3°F | Resp 16 | Ht 69.0 in | Wt 207.0 lb

## 2022-04-05 DIAGNOSIS — I700 Atherosclerosis of aorta: Secondary | ICD-10-CM

## 2022-04-05 DIAGNOSIS — E782 Mixed hyperlipidemia: Secondary | ICD-10-CM | POA: Diagnosis not present

## 2022-04-05 DIAGNOSIS — E1142 Type 2 diabetes mellitus with diabetic polyneuropathy: Secondary | ICD-10-CM

## 2022-04-05 DIAGNOSIS — I119 Hypertensive heart disease without heart failure: Secondary | ICD-10-CM | POA: Diagnosis not present

## 2022-04-05 DIAGNOSIS — I2510 Atherosclerotic heart disease of native coronary artery without angina pectoris: Secondary | ICD-10-CM

## 2022-04-05 MED ORDER — ROSUVASTATIN CALCIUM 40 MG PO TABS: 40.0000 mg | ORAL_TABLET | Freq: Every day | ORAL | 0 refills | Status: DC

## 2022-04-05 NOTE — Progress Notes (Signed)
Subjective:  Patient ID: Joshua Nelson, male    DOB: Aug 16, 1950  Age: 71 y.o. MRN: [id number]  Chief Complaint  Patient presents with   Diabetes    HPI Diabetes:  Complications: hyperlipidemia, hypertension, and neuropathy  Glucose checking:  patient has Freestyle Libre 2.  Glucose logs: 120-350 Hypoglycemia: once about a week ago.  Most recent A1C:7.5 Current medications: Farxiga 10 mg take 1 tablet daily, Lantus inject 65 units daily, Fiasp 10 units before breakfast and supper. Patient is unable to get the trulicity.  Last Eye Exam: overdue. 2021. Foot checks: Daily  Hyperlipidemia: Current medications: Atorvastatin 80 mg take 1 tablet daily, Zetia 10 mg take 1 tablet daily.   Hypertensive with heart disease:: Current medications: Metoprolol 25 mg take 1 tablet daily, Lisinopril 2.5 mg  mg take 1 tablet daily  Diet: Fairly healthy most of the time.   Exercise: some walking.         Current Outpatient Medications on File Prior to Visit  Medication Sig Dispense Refill   Continuous Blood Gluc Sensor (FREESTYLE LIBRE 2 SENSOR) MISC E11.42 Change sensor every 10 days. 3 each 2   ezetimibe (ZETIA) 10 MG tablet TAKE ONE (1) TABLET ONCE DAILY 90 tablet 1   FARXIGA 10 MG TABS tablet TAKE 1 TABLET BY MOUTH ONCE DAILY IN THE MORNING AS DIRECTED. 90 tablet 1   insulin aspart (FIASP FLEXTOUCH) 100 UNIT/ML FlexTouch Pen 10 U before breakfast and supper. 18 mL 0   LANTUS SOLOSTAR 100 UNIT/ML Solostar Pen Inject 65 Units into the skin daily. Pt takes in the pm per wife 30 mL 1   Meclizine HCl 25 MG CHEW TAKE 1 TABLET THREE TIMES DAILY AS NEEDED FOR DIZZINESS 30 tablet 0   metoprolol succinate (TOPROL-XL) 25 MG 24 hr tablet TAKE ONE (1) TABLET ONCE DAILY takes in am 90 tablet 1   lisinopril (ZESTRIL) 5 MG tablet Once daily in the am (Patient taking differently: Take 2.5 mg by mouth daily. Once daily in the am) 90 tablet 0   No current facility-administered medications on file prior to  visit.   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   PONV (postoperative nausea and vomiting)    Prostate cancer ([place]) 07/2021   Past Surgical History:  Procedure Laterality Date   CYSTOSCOPY WITH URETHRAL DILATATION N/A 08/01/2021   Procedure: CYSTOSCOPY WITH BALLOON URETHRAL DILATATION;  Surgeon: Robley Fries, MD;  Location: [facility];  Service: Urology;  Laterality: N/A;   heart stents     x 3   left cataract  10/2019   nose surgery for dog bite as a kid      right cataract Right 08/2019   TRANSURETHRAL RESECTION OF BLADDER TUMOR N/A 08/01/2021   Procedure: TRANSURETHRAL RESECTION OF PROSTATE;  Surgeon: Robley Fries, MD;  Location: [facility];  Service: Urology;  Laterality: N/A;  59 MINS    Family History  Problem Relation Age of Onset   Endometrial cancer Mother    CAD Mother    AAA (abdominal aortic aneurysm) Mother    Healthy Father    Heart attack Brother    Social History   Socioeconomic  History   Marital status: Married    Spouse name: [name]   Number of children: 1   Years of education: 12   Highest education level: High school graduate  Occupational History   Occupation: Retired  Tobacco Use   Smoking status: Former    Types: Cigarettes    Quit date: 2003    Years since quitting: 20.6   Smokeless tobacco: Never  Vaping Use   Vaping Use: Never used  Substance and Sexual Activity   Alcohol use: Not Currently    Comment: none since 2000   Drug use: Never   Sexual activity: Not on file  Other Topics Concern   Not on file  Social History Narrative   Lives at home with his wife.   2 cups caffeine per day.   Right-handed.   Social Determinants of  Health   Financial Resource Strain: High Risk (02/05/2022)   Overall Financial Resource Strain (CARDIA)    Difficulty of Paying Living Expenses: Very hard  Food Insecurity: No Food Insecurity (12/21/2021)   Hunger Vital Sign    Worried About Running Out of Food in the Last Year: Never true    Ran Out of Food in the Last Year: Never true  Transportation Needs: No Transportation Needs (02/05/2022)   PRAPARE - Hydrologist (Medical): No    Lack of Transportation (Non-Medical): No  Physical Activity: Sufficiently Active (12/21/2021)   Exercise Vital Sign    Days of Exercise per Week: 5 days    Minutes of Exercise per Session: 30 min  Stress: Not on file  Social Connections: Not on file    Review of Systems  Constitutional:  Negative for chills and fever.  HENT:  Negative for congestion, rhinorrhea and sore throat.   Respiratory:  Negative for cough and shortness of breath.   Cardiovascular:  Negative for chest pain and palpitations.  Gastrointestinal:  Negative for abdominal pain, constipation, diarrhea, nausea and vomiting.  Endocrine: Positive for polyphagia. Negative for polydipsia.  Genitourinary:  Positive for frequency and urgency. Negative for dysuria.  Musculoskeletal:  Negative for arthralgias, back pain and myalgias.  Neurological:  Negative for dizziness and headaches.  Psychiatric/Behavioral:  Negative for dysphoric mood. The patient is not nervous/anxious.      Objective:  BP 120/64   Pulse 88   Temp (!) 97.3 F (36.3 C)   Resp 16   Ht '5\' 9"'$  (1.753 m)   Wt 207 lb (93.9 kg)   BMI 30.57 kg/m      04/05/2022    1:31 PM 12/21/2021   11:43 AM 11/27/2021    2:28 PM  BP/Weight  Systolic BP 580 998 338  Diastolic BP 64 80 78  Wt. (Lbs) 207 208.6 202  BMI 30.57 kg/m2 30.8 kg/m2 29.83 kg/m2    Physical Exam Vitals reviewed.  Constitutional:      Appearance: Normal appearance.  Neck:     Vascular: No carotid bruit.  Cardiovascular:      Rate and Rhythm: Normal rate and regular rhythm.     Pulses: Normal pulses.     Heart sounds: Normal heart sounds.  Pulmonary:     Effort: Pulmonary effort is normal.     Breath sounds: Normal breath sounds. No wheezing, rhonchi or rales.  Abdominal:     General: Bowel sounds are normal.     Palpations: Abdomen is soft.     Tenderness: There is no abdominal tenderness.  Neurological:  Mental Status: He is alert.  Psychiatric:        Mood and Affect: Mood normal.        Behavior: Behavior normal.     Diabetic Foot Exam - Simple   Simple Foot Form Diabetic Foot exam was performed with the following findings: Yes 04/05/2022  2:23 PM  Visual Inspection No deformities, no ulcerations, no other skin breakdown bilaterally: Yes Sensation Testing Intact to touch and monofilament testing bilaterally: Yes Pulse Check Posterior Tibialis and Dorsalis pulse intact bilaterally: Yes Comments      Lab Results  Component Value Date   WBC 7.9 04/02/2022   HGB 14.5 04/02/2022   HCT 45.1 04/02/2022   PLT 185 04/02/2022   GLUCOSE 127 (H) 04/02/2022   CHOL 209 (H) 04/02/2022   TRIG 126 04/02/2022   HDL 40 04/02/2022   LDLCALC 146 (H) 04/02/2022   ALT 16 04/02/2022   AST 12 04/02/2022   NA 143 04/02/2022   K 4.5 04/02/2022   CL 104 04/02/2022   CREATININE 0.90 04/02/2022   BUN 16 04/02/2022   CO2 24 04/02/2022   TSH 0.687 06/28/2020   HGBA1C 8.3 (H) 04/02/2022   MICROALBUR 150 08/18/2021      Assessment & Plan:   Problem List Items Addressed This Visit       Cardiovascular and Mediastinum   Hypertensive heart disease without heart failure    The current medical regimen is effective;  continue present plan and medications. Management per specialist.        Relevant Medications   rosuvastatin (CRESTOR) 40 MG tablet   Atherosclerosis of native coronary artery of native heart without angina pectoris    Start crestor. Continue metoprolol Needs follow up with  cardiology. Phone number given to patient       Relevant Medications   rosuvastatin (CRESTOR) 40 MG tablet   Atherosclerosis of aorta (HCC)    LDL not at goal Change lipitor 80 mg to crestor 40 mg before bed.       Relevant Medications   rosuvastatin (CRESTOR) 40 MG tablet     Endocrine   Diabetic polyneuropathy ([place]) - Primary    Control: not at goal Recommend check sugars fasting daily. Recommend check feet daily. Recommend annual eye exams. Medicines:  Continue lantus 65 U daily, Farxiga 10 mg daily. Wating to receive ozempic pap. Increase fiasp (short acting insulin) Take 10 U before each meal.  150-200 ADD 2 U 201-250 ADD 4 U 251-300 ADD 6 U 301-350 ADD 8 U 351-400 ADD 10 U > 400 ADD 12 UContinue to work on eating a healthy diet and exercise.  Labs reviewed.       Relevant Medications   rosuvastatin (CRESTOR) 40 MG tablet   Other Relevant Orders   Ambulatory referral to Optometry     Other   Mixed hyperlipidemia    Not at goal.  Recommend change lipitor to crestor 40 mg before bed. Continue zetia.  Continue to work on eating a healthy diet and exercise.  Labs reviewed.       Relevant Medications   rosuvastatin (CRESTOR) 40 MG tablet  .  Meds ordered this encounter  Medications   rosuvastatin (CRESTOR) 40 MG tablet    Sig: Take 1 tablet (40 mg total) by mouth daily.    Dispense:  90 tablet    Refill:  0    Orders Placed This Encounter  Procedures   Ambulatory referral to Optometry     Follow-up: Return in  about 3 months (around 07/06/2022) for chronic fasting, FLU SHOT Appointment in 04/2022.Marland Kitchen  An After Visit Summary was printed and given to the patient.  [name], MD [facility] [phone]

## 2022-04-05 NOTE — Assessment & Plan Note (Signed)
Not at goal.  Recommend change lipitor to crestor 40 mg before bed. Continue zetia.  Continue to work on eating a healthy diet and exercise.  Labs reviewed.

## 2022-04-05 NOTE — Assessment & Plan Note (Addendum)
>>  ASSESSMENT AND PLAN FOR ATHEROSCLEROSIS OF NATIVE CORONARY ARTERY OF NATIVE HEART WITHOUT ANGINA PECTORIS WRITTEN ON 04/05/2022  2:25 PM BY Delbert Darley, MD  Start crestor. Continue metoprolol Needs follow up with cardiology. Phone number given to patient   >>ASSESSMENT AND PLAN FOR ATHEROSCLEROSIS OF AORTA ([employer]) WRITTEN ON 04/05/2022  2:24 PM BY Linkin Vizzini, MD  LDL not at goal Change lipitor 80 mg to crestor 40 mg before bed.

## 2022-04-05 NOTE — Assessment & Plan Note (Addendum)
Control: not at goal Recommend check sugars fasting daily. Recommend check feet daily. Recommend annual eye exams. Medicines:  Continue lantus 65 U daily, Farxiga 10 mg daily. Wating to receive ozempic pap. Increase fiasp (short acting insulin) Take 10 U before each meal.  150-200 ADD 2 U 201-250 ADD 4 U 251-300 ADD 6 U 301-350 ADD 8 U 351-400 ADD 10 U > 400 ADD 12 UContinue to work on eating a healthy diet and exercise.  Labs reviewed.

## 2022-04-05 NOTE — Assessment & Plan Note (Signed)
>>  ASSESSMENT AND PLAN FOR DIABETIC POLYNEUROPATHY (HCC) WRITTEN ON 04/05/2022  2:28 PM BY COX, KIRSTEN, MD  Control: not at goal Recommend check sugars fasting daily. Recommend check feet daily. Recommend annual eye exams. Medicines:  Continue lantus 65 U daily, Farxiga 10 mg daily. Wating to receive ozempic pap. Increase fiasp (short acting insulin) Take 10 U before each meal.  150-200 ADD 2 U 201-250 ADD 4 U 251-300 ADD 6 U 301-350 ADD 8 U 351-400 ADD 10 U > 400 ADD 12 UContinue to work on eating a healthy diet and exercise.  Labs reviewed.

## 2022-04-05 NOTE — Assessment & Plan Note (Signed)
The current medical regimen is effective;  continue present plan and medications. Management per specialist.   

## 2022-04-05 NOTE — Assessment & Plan Note (Signed)
LDL not at goal Change lipitor 80 mg to crestor 40 mg before bed.

## 2022-04-05 NOTE — Patient Instructions (Addendum)
Please call to make a regular appointment  [facility]  [address] [place], [place] [address] Phone: [phone]  Referral done to [facility]. We will call with appointment time.   Change lipitor to crestor 40 mg daily at night.   Increase fiasp (short acting insulin) Take 10 U before each meal.  150-200 ADD 2 U 201-250 ADD 4 U 251-300 ADD 6 U 301-350 ADD 8 U 351-400 ADD 10 U > 400 ADD 12 U

## 2022-04-12 NOTE — Telephone Encounter (Signed)
04/12/22- Called pt back to follow up but no answer. I left another message informing him we still need those to continue processing his application if he hasn't already sent those in.  Joshua Nelson, [place] Pharmacist Assistant  [phone]

## 2022-04-17 DIAGNOSIS — C61 Malignant neoplasm of prostate: Secondary | ICD-10-CM | POA: Diagnosis not present

## 2022-04-20 DIAGNOSIS — R351 Nocturia: Secondary | ICD-10-CM | POA: Diagnosis not present

## 2022-04-20 DIAGNOSIS — C61 Malignant neoplasm of prostate: Secondary | ICD-10-CM | POA: Diagnosis not present

## 2022-04-20 DIAGNOSIS — N401 Enlarged prostate with lower urinary tract symptoms: Secondary | ICD-10-CM | POA: Diagnosis not present

## 2022-04-20 DIAGNOSIS — N5201 Erectile dysfunction due to arterial insufficiency: Secondary | ICD-10-CM | POA: Diagnosis not present

## 2022-04-26 ENCOUNTER — Other Ambulatory Visit: Payer: Self-pay

## 2022-04-26 MED ORDER — BLOOD PRESSURE MONITOR/S CUFF MISC: 0 refills | Status: DC

## 2022-05-02 ENCOUNTER — Telehealth: Payer: Self-pay

## 2022-05-02 ENCOUNTER — Other Ambulatory Visit: Payer: Self-pay | Admitting: Family Medicine

## 2022-05-02 ENCOUNTER — Telehealth: Payer: HMO

## 2022-05-02 DIAGNOSIS — E1142 Type 2 diabetes mellitus with diabetic polyneuropathy: Secondary | ICD-10-CM

## 2022-05-02 NOTE — Telephone Encounter (Signed)
Diabetes (A1c goal <8%)      Lab Results  Component Value Date    HGBA1C 7.5 (H) 11/23/2021    HGBA1C 8.7 (H) 07/26/2021    HGBA1C 8.1 (H) 04/25/2021         Lab Results  Component Value Date    MICROALBUR 150 08/18/2021    LDLCALC 74 11/23/2021    CREATININE 0.85 11/23/2021         Lab Results  Component Value Date    NA 145 (H) 11/23/2021    K 4.9 11/23/2021    CREATININE 0.85 11/23/2021    EGFR 95 11/23/2021    GFRNONAA >60 07/26/2021    GLUCOSE 111 (H) 11/23/2021         Lab Results  Component Value Date    WBC 8.9 11/23/2021    HGB 14.5 11/23/2021    HCT 44.0 11/23/2021    MCV 88 11/23/2021    PLT 186 11/23/2021  -Uncontrolled -Current medications: Libre Appropriate, Query effective, Safe, Query accessible Dapagliflozin 10mg  (PAP Approved 05/02/22) Appropriate, Query effective, Safe, accessible Trulicity 4.5mg /week Appropriate, Query effective, Safe, Query accessible ASPART SS Appropriate, Query effective, Safe, Query accessible -Medications previously tried: None  -Current home glucose readings fasting glucose:  Jan 2023: Doesn't test, doesn't have meter/Libre post prandial glucose:  -Denies hypoglycemic/hyperglycemic symptoms -Current exercise: None -Educated on A1c and blood sugar goals; -Counseled to check feet daily and get yearly eye exams Jan 2023: Main priority was getting Libre. Spoke with wife, will call  [phone], [facility], to see status of Elenor Legato. Will f/u next week to hear what next step is. Patient unable to get Trulicity 4.5mg /week, had to get 3.0mg  pen from Rx 09/01/21: They called [facility] and was told they only have Decomm in stock and need a new script. Wife did not call me and has been waiting for script to be sent in. She came to office and picked up [place] sample to last until [facility], but they haven't started using it yet. Will get DEXCOMM sent ASAP June 2023: PAP for Ozempic and Dapagliflozin sent to patient at beginning of the month.  They received it and thought it was the Trulicity PAP that they got last month so never called or finished it. Counseled them on the difference and to call if you have questions. Patient originally didn't answer phone for visit today, had to call his wife. Then patient called back. Counseled they have to bring in ASAP for [place] to send out Sept 2023: Tried calling patient and he refused to answer. Spoke with wife, she was very busy as she was working. She apologized profusely for her partner not answering. I explained it wasn't her fault. Trulicity is unable to get approved for PAP. Will have Danielle call to look into why Ozempic PAP was denied, sent her msg on Teams to call today. UPDATE: DANIELLE CALLED AND THEY NEED UPDATED TAX RETURNS FOR OZEMPIC -Dapagliflozin PAP approved -Per wife, insulin is $0 now as well

## 2022-05-03 ENCOUNTER — Telehealth: Payer: Self-pay

## 2022-05-03 NOTE — Progress Notes (Signed)
05/03/22- Called wife today to inform her that her husbands Ozempic PAP was denied. They still need his latest Federal tax return to finish processing PAP. I had to leave another message informing them. I advised to call me back.  [name], [place] Pharmacist Assistant  [phone]

## 2022-05-04 ENCOUNTER — Telehealth: Payer: Self-pay

## 2022-05-04 NOTE — Progress Notes (Signed)
05/04/22- Called [facility] to ask about pt PAP application for Ozempic. Pt wife stated they did send tax returns and this was denied due to needing these. I called for an update but no answer after 34mns. Will call a different day  05/15/22-[employer] #[phone] could not get anyone to answer so I left a message to request a call back today at 3. [facility] called back but they ended up calling at 1:45pm and I was unable to answer the phone. Assigning a task to search for the tax returns that wife stated they sent.    DElray Mcgregor CUniversity PlacePharmacist Assistant  [phone]

## 2022-05-10 ENCOUNTER — Other Ambulatory Visit: Payer: Self-pay

## 2022-05-10 MED ORDER — MECLIZINE HCL 25 MG PO CHEW: CHEWABLE_TABLET | ORAL | 0 refills | Status: DC

## 2022-05-14 ENCOUNTER — Ambulatory Visit: Payer: HMO | Admitting: Podiatry

## 2022-05-16 ENCOUNTER — Telehealth: Payer: Self-pay

## 2022-05-16 NOTE — Chronic Care Management (AMB) (Signed)
Chronic Care Management Pharmacy Assistant   Name: Joshua Nelson  MRN: [id number] DOB: June 19, 1951  Reason for Encounter: Disease State/ Diabetes   Recent office visits:  04-05-2022 [name], MD. START rosuvastatin 40 mg daily. STOP atorvastatin, Trulicity, gabapentin and vesicare. Referral placed to optometry.   Recent consult visits:  None  Hospital visits:  None in previous 6 months  Medications: Outpatient Encounter Medications as of 05/16/2022  Medication Sig   Blood Pressure Monitoring (BLOOD PRESSURE MONITOR/S CUFF) MISC Use as directed.   Continuous Blood Gluc Sensor (FREESTYLE LIBRE 2 SENSOR) MISC Change sensor every 10 day as directed   ezetimibe (ZETIA) 10 MG tablet TAKE ONE (1) TABLET ONCE DAILY   FARXIGA 10 MG TABS tablet TAKE 1 TABLET BY MOUTH ONCE DAILY IN THE MORNING AS DIRECTED.   insulin aspart (FIASP FLEXTOUCH) 100 UNIT/ML FlexTouch Pen 10 U before breakfast and supper.   LANTUS SOLOSTAR 100 UNIT/ML Solostar Pen Inject 65 Units into the skin daily. Pt takes in the pm per wife   lisinopril (ZESTRIL) 5 MG tablet Once daily in the am (Patient taking differently: Take 2.5 mg by mouth daily. Once daily in the am)   Meclizine HCl 25 MG CHEW TAKE 1 TABLET THREE TIMES DAILY AS NEEDED FOR DIZZINESS   metoprolol succinate (TOPROL-XL) 25 MG 24 hr tablet TAKE ONE (1) TABLET ONCE DAILY takes in am   rosuvastatin (CRESTOR) 40 MG tablet Take 1 tablet (40 mg total) by mouth daily.   No facility-administered encounter medications on file as of 05/16/2022.  Recent Relevant Labs: Lab Results  Component Value Date/Time   HGBA1C 8.3 (H) 04/02/2022 08:37 AM   HGBA1C 7.5 (H) 11/23/2021 10:25 AM   MICROALBUR 150 08/18/2021 08:47 AM   MICROALBUR 10 10/17/2020 09:50 AM    Kidney Function Lab Results  Component Value Date/Time   CREATININE 0.90 04/02/2022 08:37 AM   CREATININE 0.85 11/23/2021 10:25 AM   GFRNONAA >60 07/26/2021 11:30 AM   GFRAA 105 06/28/2020 09:30 AM      Current antihyperglycemic regimen:  Dapagliflozin 10 mg daily Lantus 65 units at bedtime Fiasp 10 units daily   Patient verbally confirms he is taking the above medications as directed. Yes  What recent interventions/DTPs have been made to improve glycemic control:  Educated on A1c and blood sugar goals; -Counseled to check feet daily and get yearly eye exams  Have there been any recent hospitalizations or ED visits since last visit with CPP? No  Patient denies hypoglycemic symptoms  Patient reports hyperglycemic symptoms, including blurry vision, excessive thirst, and fatigue  How often are you checking your blood sugar? Patient uses dexcom  What are your blood sugars ranging?  Fasting: 10-05 149, 10-04 150, 10-03 146, Before meals:  After meals: 10-05 205, 10-04 215, 10-03 207 Bedtime: 10-05 369, 10-04 301, 10-03 355  On insulin? Yes How many units:20  During the week, how often does your blood glucose drop below 70? Never  Are you checking your feet daily/regularly? Yes  Adherence Review: Is the patient currently on a STATIN medication? Yes Is the patient currently on ACE/ARB medication? Yes Does the patient have >5 day gap between last estimated fill dates?    Care Gaps: Last eye exam / Retinopathy Screening? 03-2021 Last Annual Wellness Visit?  08-03-2021 Last Diabetic Foot Exam? 06-19-2021  NOTES: Contacted [employer] to follow up on Ozempic and was told application was missing income. Patient's wife stated she dropped off income taxes to office. Sent  task to [place] to follow up.  Star Rating Drugs: Lisinopril 5 mg- Last filled Rosuvastatin 40 mg- Last filled 04-05-2022 90 [facility] Farxiga- Patient assistance  [place] Pharmacist Assistant [phone]

## 2022-05-21 ENCOUNTER — Ambulatory Visit (INDEPENDENT_AMBULATORY_CARE_PROVIDER_SITE_OTHER): Payer: HMO

## 2022-05-21 DIAGNOSIS — Z23 Encounter for immunization: Secondary | ICD-10-CM | POA: Diagnosis not present

## 2022-05-22 ENCOUNTER — Ambulatory Visit: Payer: HMO | Admitting: Podiatry

## 2022-05-22 DIAGNOSIS — M79675 Pain in left toe(s): Secondary | ICD-10-CM | POA: Diagnosis not present

## 2022-05-22 DIAGNOSIS — E1142 Type 2 diabetes mellitus with diabetic polyneuropathy: Secondary | ICD-10-CM

## 2022-05-22 DIAGNOSIS — M79674 Pain in right toe(s): Secondary | ICD-10-CM | POA: Diagnosis not present

## 2022-05-22 DIAGNOSIS — B351 Tinea unguium: Secondary | ICD-10-CM | POA: Diagnosis not present

## 2022-05-22 DIAGNOSIS — M2042 Other hammer toe(s) (acquired), left foot: Secondary | ICD-10-CM

## 2022-05-22 DIAGNOSIS — E1169 Type 2 diabetes mellitus with other specified complication: Secondary | ICD-10-CM

## 2022-05-22 DIAGNOSIS — M2041 Other hammer toe(s) (acquired), right foot: Secondary | ICD-10-CM

## 2022-05-22 NOTE — Progress Notes (Signed)
  Subjective:  Patient ID: Joshua Nelson, male    DOB: 05/12/1951,  MRN: [id number]  Chief Complaint  Patient presents with   Diabetic Foot Care    Room 1  Nail trim     71 y.o. male presents with the above complaint. History confirmed with patient. Patient presenting with pain related to dystrophic thickened elongated nails. Patient is unable to trim own nails related to nail dystrophy and/or mobility issues. Patient does have a history of T2DM with peripheral neuropathy.  Objective:  Physical Exam: warm, good capillary refill, DP and PT pulses 1/4 bilateral nail exam onychomycosis of the toenails, onycholysis, and dystrophic nails DP pulses palpable, PT pulses palpable, and protective sensation absent Left Foot:  Pain with palpation of nails due to elongation and dystrophic growth.  Right Foot: Pain with palpation of nails due to elongation and dystrophic growth.   Assessment:   1. Pain due to onychomycosis of toenails of both feet   2. DM type 2 with diabetic peripheral neuropathy ([place])   3. Onychomycosis of multiple toenails with type 2 diabetes mellitus and peripheral neuropathy ([place])   4. Hammer toes of both feet      Plan:  Patient was evaluated and treated and all questions answered.  #Onychomycosis with pain  -Nails palliatively debrided as below. -Educated on self-care  Procedure: Nail Debridement Rationale: Pain Type of Debridement: manual, sharp debridement. Instrumentation: Nail nipper, rotary burr. Number of Nails: 10  Return in about 3 months (around 08/22/2022) for [facility].         Everitt Amber, DPM [facility]

## 2022-05-24 ENCOUNTER — Other Ambulatory Visit: Payer: Self-pay

## 2022-05-25 ENCOUNTER — Other Ambulatory Visit: Payer: Self-pay

## 2022-05-25 MED ORDER — GABAPENTIN 300 MG PO CAPS: 300.0000 mg | ORAL_CAPSULE | Freq: Three times a day (TID) | ORAL | 1 refills | Status: DC

## 2022-05-29 ENCOUNTER — Ambulatory Visit (INDEPENDENT_AMBULATORY_CARE_PROVIDER_SITE_OTHER): Payer: HMO

## 2022-05-29 DIAGNOSIS — E782 Mixed hyperlipidemia: Secondary | ICD-10-CM

## 2022-05-29 DIAGNOSIS — I119 Hypertensive heart disease without heart failure: Secondary | ICD-10-CM

## 2022-05-29 DIAGNOSIS — E118 Type 2 diabetes mellitus with unspecified complications: Secondary | ICD-10-CM

## 2022-05-29 NOTE — Progress Notes (Signed)
Chronic Care Management Pharmacy Note  05/29/2022 Name:  ZEN CEDILLOS MRN:  [id number] DOB:  1950/09/11   Recommendations/Changes made from today's visit: -Counseled patient to call Ozempic to determine status of PAP -Due for AWV   Subjective: NAHOME BUBLITZ is an 71 y.o. year old male who is a primary patient of Cox, Kirsten, MD.  The CCM team was consulted for assistance with disease management and care coordination needs.    Engaged with patient by telephone for follow up visit in response to provider referral for pharmacy case management and/or care coordination services.   Consent to Services:  The patient was given the following information about Chronic Care Management services today, agreed to services, and gave verbal consent: 1. CCM service includes personalized support from designated clinical staff supervised by the primary care provider, including individualized plan of care and coordination with other care providers 2. 24/7 contact phone numbers for assistance for urgent and routine care needs. 3. Service will only be billed when office clinical staff spend 20 minutes or more in a month to coordinate care. 4. Only one practitioner may furnish and bill the service in a calendar month. 5.The patient may stop CCM services at any time (effective at the end of the month) by phone call to the office staff. 6. The patient will be responsible for cost sharing (co-pay) of up to 20% of the service fee (after annual deductible is met). Patient agreed to services and consent obtained.  Patient Care Team: [name], MD as PCP - General (Family Medicine) Lane Hacker, [facility] (Pharmacist) Robley Fries, MD as Consulting Physician (Urology)  Recent office visits:  04-05-2022 [name], MD. START rosuvastatin 40 mg daily. STOP atorvastatin, Trulicity, gabapentin and vesicare. Referral placed to optometry.    Recent consult visits:  None   Hospital visits:  None in  previous 6 months   Objective:  Lab Results  Component Value Date   CREATININE 0.90 04/02/2022   BUN 16 04/02/2022   GFRNONAA >60 07/26/2021   GFRAA 105 06/28/2020   NA 143 04/02/2022   K 4.5 04/02/2022   CALCIUM 9.7 04/02/2022   CO2 24 04/02/2022   GLUCOSE 127 (H) 04/02/2022    Lab Results  Component Value Date/Time   HGBA1C 8.3 (H) 04/02/2022 08:37 AM   HGBA1C 7.5 (H) 11/23/2021 10:25 AM   MICROALBUR 150 08/18/2021 08:47 AM   MICROALBUR 10 10/17/2020 09:50 AM    Last diabetic Eye exam:  Lab Results  Component Value Date/Time   HMDIABEYEEXA No Retinopathy 09/25/2019 12:00 AM    Last diabetic Foot exam: No results found for: "HMDIABFOOTEX"   Lab Results  Component Value Date   CHOL 209 (H) 04/02/2022   HDL 40 04/02/2022   LDLCALC 146 (H) 04/02/2022   TRIG 126 04/02/2022   CHOLHDL 5.2 (H) 04/02/2022       Latest Ref Rng & Units 04/02/2022    8:37 AM 11/23/2021   10:25 AM 04/25/2021    9:45 AM  Hepatic Function  Total Protein 6.0 - 8.5 g/dL 7.0  7.0  6.7   Albumin 3.9 - 4.9 g/dL 4.5  4.5  4.5   AST 0 - 40 IU/L _0 ALT 0 - 44 IU/L _1 Alk Phosphatase 44 - 121 IU/L 78  80  61   Total Bilirubin 0.0 - 1.2 mg/dL 0.5  0.7  0.8     Lab Results  Component Value Date/Time   TSH 0.687 06/28/2020 09:30 AM       Latest Ref Rng & Units 04/02/2022    8:37 AM 11/23/2021   10:25 AM 07/26/2021   11:30 AM  CBC  WBC 3.4 - 10.8 x10E3/uL 7.9  8.9  11.0   Hemoglobin 13.0 - 17.7 g/dL 14.5  14.5  13.6   Hematocrit 37.5 - 51.0 % 45.1  44.0  41.5   Platelets 150 - 450 x10E3/uL 185  186  185     No results found for: "VD25OH"  Clinical ASCVD: Yes  The ASCVD Risk score (Arnett DK, et al., 2019) failed to calculate for the following reasons:   The patient has a prior MI or stroke diagnosis       04/05/2022    1:36 PM 12/21/2021   11:49 AM 08/18/2021    8:08 AM  Depression screen PHQ 2/9  Decreased Interest 0 0 0  Down, Depressed, Hopeless 0 0 0  PHQ - 2  Score 0 0 0     Other: (CHADS2VASc if Afib, MMRC or CAT for COPD, ACT, DEXA)  Social History   Tobacco Use  Smoking Status Former   Types: Cigarettes   Quit date: 2003   Years since quitting: 20.8  Smokeless Tobacco Never   BP Readings from Last 3 Encounters:  04/05/22 120/64  12/21/21 122/80  11/27/21 122/78   Pulse Readings from Last 3 Encounters:  04/05/22 88  12/21/21 87  11/27/21 78   Wt Readings from Last 3 Encounters:  04/05/22 207 lb (93.9 kg)  12/21/21 208 lb 9.6 oz (94.6 kg)  11/27/21 202 lb (91.6 kg)   BMI Readings from Last 3 Encounters:  04/05/22 30.57 kg/m  12/21/21 30.80 kg/m  11/27/21 29.83 kg/m    Assessment/Interventions: Review of patient past medical history, allergies, medications, health status, including review of consultants reports, laboratory and other test data, was performed as part of comprehensive evaluation and provision of chronic care management services.   SDOH:  (Social Determinants of Health) assessments and interventions performed: Yes SDOH Interventions    Flowsheet Row Chronic Care Management from 05/29/2022 in [place] Management from 02/05/2022 in [place] Office Visit from 12/21/2021 in [place] Management from 09/01/2021 in [place] Interventions      Food Insecurity Interventions -- -- Intervention Not Indicated --  Housing Interventions -- -- Intervention Not Indicated --  Transportation Interventions Intervention Not Indicated Intervention Not Indicated Intervention Not Indicated Intervention Not Indicated  Financial Strain Interventions Other (Comment)  [PAP] Other (Comment)  [PAP (See CP)] Other (Comment)  [Trulicity is costly, applying for patient assistance] Other (Comment)  [Can't afford CGM, in process of getting covered]  Physical Activity Interventions -- -- Intervention Not Indicated --      SDOH Screenings   Food Insecurity: No Food  Insecurity (12/21/2021)  Housing: Low Risk  (12/21/2021)  Transportation Needs: No Transportation Needs (05/29/2022)  Alcohol Screen: Low Risk  (12/21/2021)  Depression (PHQ2-9): Low Risk  (04/05/2022)  Financial Resource Strain: High Risk (05/29/2022)  Physical Activity: Sufficiently Active (12/21/2021)  Tobacco Use: Medium Risk (04/05/2022)    [place]  No Known Allergies  Medications Reviewed Today     Reviewed by Lane Hacker, [facility] (Pharmacist) on 05/29/22 at [place] List Status: <None>   Medication Order Taking? Sig Documenting Provider Last Dose Status Informant  Blood Pressure Monitoring (BLOOD PRESSURE MONITOR/S CUFF) MISC  [id number]  Use as directed. Cox, Kirsten, MD  Active   Continuous Blood Gluc Sensor (FREESTYLE LIBRE 2 SENSOR) [place] [id number]  Change sensor every 10 day as directed Cox, Kirsten, MD  Active   ezetimibe (ZETIA) 10 MG tablet [id number] No TAKE ONE (1) TABLET ONCE DAILY Cox, Kirsten, MD Taking Active Spouse/Significant Other  FARXIGA 10 MG TABS tablet [id number] No TAKE 1 TABLET BY MOUTH ONCE DAILY IN THE MORNING AS DIRECTED. Cox, Kirsten, MD Taking Active   gabapentin (NEURONTIN) 300 MG capsule [id number]  Take 1 capsule (300 mg total) by mouth 3 (three) times daily. Cox, Kirsten, MD  Active   insulin aspart (FIASP FLEXTOUCH) 100 UNIT/ML FlexTouch Pen [id number] No 10 U before breakfast and supper. Cox, Kirsten, MD Taking Active   LANTUS SOLOSTAR 100 UNIT/ML Solostar Pen [id number] No Inject 65 Units into the skin daily. Pt takes in the pm per wife Cox, Kirsten, MD Taking Active   lisinopril (ZESTRIL) 5 MG tablet [id number] No Once daily in the am  Patient taking differently: Take 2.5 mg by mouth daily. Once daily in the am   Cox, Elnita Maxwell, MD Taking Active   Meclizine HCl 25 MG CHEW [id number]  TAKE 1 TABLET THREE TIMES DAILY AS NEEDED FOR DIZZINESS Cox, Kirsten, MD  Active   metoprolol succinate (TOPROL-XL) 25 MG 24 hr tablet [id number] No TAKE ONE (1) TABLET  ONCE DAILY takes in am Cox, Elnita Maxwell, MD Taking Active   MYRBETRIQ 50 MG TB24 tablet [id number]   [provider]  Active   rosuvastatin (CRESTOR) 40 MG tablet [id number]  Take 1 tablet (40 mg total) by mouth daily. [name], MD  Active             Patient Active Problem List   Diagnosis Date Noted   Atherosclerosis of aorta ([place]) 12/03/2021   Prostate cancer ([place]) 08/08/2021   BPH with obstruction/lower urinary tract symptoms 08/01/2021   Ulcer of left foot ([place]) 06/14/2021   Preoperative general physical examination 05/01/2021   Mixed hyperlipidemia 11/30/2019   Diabetic polyneuropathy ([place]) 11/30/2019   Hypertensive heart disease without heart failure 11/30/2019   Chronic left shoulder pain 11/30/2019   Obstructive sleep apnea 04/03/2018   Atherosclerosis of native coronary artery of native heart without angina pectoris 03/28/2018   Old myocardial infarction 03/28/2018    Immunization History  Administered Date(s) Administered   Fluad Quad(high Dose 65+) 06/28/2020, 04/25/2021, 05/21/2022   PFIZER(Purple Top)SARS-COV-2 Vaccination 12/10/2019, 01/08/2020   PNEUMOCOCCAL CONJUGATE-20 05/26/2021   Pneumococcal Polysaccharide-23 06/28/2020    Conditions to be addressed/monitored:  Hypertension, Hyperlipidemia, and Diabetes  Care Plan : [place]  Updates made by Lane Hacker, RPH since 05/29/2022 12:00 AM     Problem: DM, Lipids, Pain, HTN   Priority: High  Onset Date: 08/24/2021     Long-Range Goal: Disease State Management   Start Date: 08/24/2021  Expected End Date: 08/24/2022  Recent Progress: On track  Priority: High  Note:   Current Barriers:  Does not contact provider office for questions/concerns  Pharmacist Clinical Goal(s):  Patient will contact provider office for questions/concerns as evidenced notation of same in electronic health record through collaboration with PharmD and provider.   Interventions: 1:1 collaboration  with [name], MD regarding development and update of comprehensive plan of care as evidenced by provider attestation and co-signature Inter-disciplinary care team collaboration (see longitudinal plan of care) Comprehensive medication review performed; medication list updated in electronic medical record  Hypertension (BP goal <140/90) BP  Readings from Last 3 Encounters:  04/05/22 120/64  12/21/21 122/80  11/27/21 122/78  -Controlled -Current treatment: Lisinopril 24m QD Appropriate, Effective, Safe, Accessible Metoprolol Succ 263mQD Appropriate, Effective, Safe, Accessible -Medications previously tried: N/A  -Current home readings: Doesn't test -Current dietary habits: "Tries to eat healthy" -Current exercise habits: None -Denies hypotensive/hypertensive symptoms -Educated on BP goals and benefits of medications for prevention of heart attack, stroke and kidney damage; -Counseled to monitor BP at home PRN, document, and provide log at future appointments -Recommended to continue current medication  Hyperlipidemia: (LDL goal < 70) The ASCVD Risk score (Arnett DK, et al., 2019) failed to calculate for the following reasons:   The patient has a prior MI or stroke diagnosis Lab Results  Component Value Date   CHOL 209 (H) 04/02/2022   CHOL 123 11/23/2021   CHOL 199 08/18/2021   Lab Results  Component Value Date   HDL 40 04/02/2022   HDL 34 (L) 11/23/2021   HDL 27 (L) 08/18/2021   Lab Results  Component Value Date   LDLCALC 146 (H) 04/02/2022   LDLCALC 74 11/23/2021   LDLCALC 143 (H) 08/18/2021   Lab Results  Component Value Date   TRIG 126 04/02/2022   TRIG 73 11/23/2021   TRIG 159 (H) 08/18/2021   Lab Results  Component Value Date   CHOLHDL 5.2 (H) 04/02/2022   CHOLHDL 3.6 11/23/2021   CHOLHDL 7.4 (H) 08/18/2021  No results found for: "LDLDIRECT" -Uncontrolled -Current treatment: Ezetimibe 10469mppropriate, Effective, Safe, Accessible -Medications  previously tried: Atorvastatin -Current dietary patterns: "Tries to eat healthy" -Current exercise habits: None -Educated on Cholesterol goals;  Jan 2023: Main priority was LibElenor Legatoday, will talk about statin compliance next week  Diabetes (A1c goal <8%) Lab Results  Component Value Date   HGBA1C 8.3 (H) 04/02/2022   HGBA1C 7.5 (H) 11/23/2021   HGBA1C 8.7 (H) 07/26/2021   Lab Results  Component Value Date   MICROALBUR 150 08/18/2021   LDLCALC 146 (H) 04/02/2022   CREATININE 0.90 04/02/2022   Lab Results  Component Value Date   NA 143 04/02/2022   K 4.5 04/02/2022   CREATININE 0.90 04/02/2022   EGFR 92 04/02/2022   GFRNONAA >60 07/26/2021   GLUCOSE 127 (H) 04/02/2022   Lab Results  Component Value Date   WBC 7.9 04/02/2022   HGB 14.5 04/02/2022   HCT 45.1 04/02/2022   MCV 88 04/02/2022   PLT 185 04/02/2022  -Uncontrolled -Current medications: Libre Appropriate, Query effective, Safe, Query accessible Dapagliflozin 14m60mpropriate, Query effective, Safe, Query accessible PAP approved 2023 Ozempic Appropriate, Query effective, Safe, Query accessible 05/29/22: Waiting on approval ASPART SS Appropriate, Query effective, Safe, Query accessible Lantus 65 units Appropriate, Query effective, Safe, Accessible -Medications previously tried: None  -Current home glucose readings fasting glucose:  Jan 2023: Doesn't test, doesn't have meter/Libre post prandial glucose:  -Denies hypoglycemic/hyperglycemic symptoms -Current exercise: None -Educated on A1c and blood sugar goals; -Counseled to check feet daily and get yearly eye exams Jan 2023: Main priority was getting Libre. Spoke with wife, will call  [phone], to see status of LibrElenor Legatoll f/u next week to hear what next step is. Patient unable to get Trulicity 4.69m8.2NF/AOZHd to get 3.0mg 43m from Rx 09/01/21: [facility] [place] [facility] [facility] and need a new script. Wife did not  call me and has been waiting for script to be sent in. She came to office and  picked up [place] sample to last until [facility], but they haven't started using it yet. Will get DEXCOMM sent ASAP June 2023: PAP for Ozempic and Dapagliflozin sent to patient at beginning of the month. They received it and thought it was the Trulicity PAP that they got last month so never called or finished it. Counseled them on the difference and to call if you have questions. Patient originally didn't answer phone for visit today, had to call his wife. Then patient called back. Counseled they have to bring in ASAP for [place] to send out October 2023: Waiting on final approval. Gave patient number to call today to check on status. Highly encouraged he call  Chronic Pain -Controlled -Current treatment: Gabapentin 351m QD Query Appropriate, Query effective, Safe, Accessible Hydrocodone/APAP 5/325 Query Appropriate, Query effective, Safe, Accessible IBU 8092mTID PRN Appropriate, Effective, Safe, Accessible -Medications previously tried: None  -Pain Scale There were no vitals filed for this visit.  Aggravating Factors: Movement  Pain Type: Patient didn't specify  Jan 2023: Main priority was LiElenor Legatowill go over pain at f/u June 2023: Main priority was cost of meds   Insomnia (Goal: 7-8 hours of sleep/night) -Not ideally controlled -Patient currently getting unknown hours of sleep/night -Patient currently waking up unknown times/night -Current treatment: N/A -Medications previously tried: Zolpidem -Counseled on sleep hygiene techniques (consistent sleep/wake up schedule, no electronic screens 1-2 hours before bed, etc)   BPH (Goal: improve urination) -Not ideally controlled -Night time urination frequency: unknown/night -Current treatment: N/A -Medications previously tried: Tamsulosin -Counseled on non-pharmacologic techniques such as Kegels Jan 2023: Main priority was LiElenor Legatowill go over BPHat f/u June 2023:  Unable to conduct IPSS today. Patient didn't answer original phone call and ran out of time  Patient Goals/Self-Care Activities Patient will:  - take medications as prescribed as evidenced by patient report and record review  Follow Up Plan: The patient has been provided with contact information for the care management team and has been advised to call with any health related questions or concerns.   CPP F/U December 2023   Na[place] ConstablePhSherian Rein. - [phone]        Medication Assistance:  -See CP  Compliance/Adherence/Medication fill history: Adherence Review: Is the patient currently on a STATIN medication? Yes Is the patient currently on ACE/ARB medication? Yes Does the patient have >5 day gap between last estimated fill dates?      Care Gaps: Last eye exam / Retinopathy Screening? 03-2021 Last Annual Wellness Visit?  08-03-2021 Last Diabetic Foot Exam? 06-19-2021    Patient[facility]:  [facility] [address]hone: [phone]: [phone] [address]a[place]hone: [phone]: [phone] [place] [address] [place]one: [phone]: [phone] pill box? No -   Pt endorses 100% compliance  We discussed: Benefits of medication synchronization, packaging and delivery as well as enhanced pharmacist oversight with [facility]. Patient decided to: Continue current medication management strategy  Care Plan and Follow Up Patient Decision:  Patient agrees to Care Plan and Follow-up.  Plan: The patient has been provided with contact information for the care management team and has been advised to call with any health related questions or concerns.  CPP F/U  December  [place] Constable, [place].D. - [phone]

## 2022-05-29 NOTE — Patient Instructions (Signed)
Visit Information   Goals Addressed   None    Patient Care Plan: CCM Pharmacy Care Plan     Problem Identified: DM, Lipids, Pain, HTN   Priority: High  Onset Date: 08/24/2021     Long-Range Goal: Disease State Management   Start Date: 08/24/2021  Expected End Date: 08/24/2022  Recent Progress: On track  Priority: High  Note:   Current Barriers:  Does not contact provider office for questions/concerns  Pharmacist Clinical Goal(s):  Patient will contact provider office for questions/concerns as evidenced notation of same in electronic health record through collaboration with PharmD and provider.   Interventions: 1:1 collaboration with [facility], Elnita Maxwell, MD regarding development and update of comprehensive plan of care as evidenced by provider attestation and co-signature Inter-disciplinary care team collaboration (see longitudinal plan of care) Comprehensive medication review performed; medication list updated in electronic medical record  Hypertension (BP goal <140/90) BP Readings from Last 3 Encounters:  04/05/22 120/64  12/21/21 122/80  11/27/21 122/78  -Controlled -Current treatment: Lisinopril 99m QD Appropriate, Effective, Safe, Accessible Metoprolol Succ 23mQD Appropriate, Effective, Safe, Accessible -Medications previously tried: N/A  -Current home readings: Doesn't test -Current dietary habits: "Tries to eat healthy" -Current exercise habits: None -Denies hypotensive/hypertensive symptoms -Educated on BP goals and benefits of medications for prevention of heart attack, stroke and kidney damage; -Counseled to monitor BP at home PRN, document, and provide log at future appointments -Recommended to continue current medication  Hyperlipidemia: (LDL goal < 70) The ASCVD Risk score (Arnett DK, et al., 2019) failed to calculate for the following reasons:   The patient has a prior MI or stroke diagnosis Lab Results  Component Value Date   CHOL 209 (H) 04/02/2022   CHOL  123 11/23/2021   CHOL 199 08/18/2021   Lab Results  Component Value Date   HDL 40 04/02/2022   HDL 34 (L) 11/23/2021   HDL 27 (L) 08/18/2021   Lab Results  Component Value Date   LDLCALC 146 (H) 04/02/2022   LDLCALC 74 11/23/2021   LDLCALC 143 (H) 08/18/2021   Lab Results  Component Value Date   TRIG 126 04/02/2022   TRIG 73 11/23/2021   TRIG 159 (H) 08/18/2021   Lab Results  Component Value Date   CHOLHDL 5.2 (H) 04/02/2022   CHOLHDL 3.6 11/23/2021   CHOLHDL 7.4 (H) 08/18/2021  No results found for: "LDLDIRECT" -Uncontrolled -Current treatment: Ezetimibe 1082mppropriate, Effective, Safe, Accessible -Medications previously tried: Atorvastatin -Current dietary patterns: "Tries to eat healthy" -Current exercise habits: None -Educated on Cholesterol goals;  Jan 2023: Main priority was LibElenor Legatoday, will talk about statin compliance next week  Diabetes (A1c goal <8%) Lab Results  Component Value Date   HGBA1C 8.3 (H) 04/02/2022   HGBA1C 7.5 (H) 11/23/2021   HGBA1C 8.7 (H) 07/26/2021   Lab Results  Component Value Date   MICROALBUR 150 08/18/2021   LDLCALC 146 (H) 04/02/2022   CREATININE 0.90 04/02/2022   Lab Results  Component Value Date   NA 143 04/02/2022   K 4.5 04/02/2022   CREATININE 0.90 04/02/2022   EGFR 92 04/02/2022   GFRNONAA >60 07/26/2021   GLUCOSE 127 (H) 04/02/2022   Lab Results  Component Value Date   WBC 7.9 04/02/2022   HGB 14.5 04/02/2022   HCT 45.1 04/02/2022   MCV 88 04/02/2022   PLT 185 04/02/2022  -Uncontrolled -Current medications: Libre Appropriate, Query effective, Safe, Query accessible Dapagliflozin 53m57mpropriate, Query effective, Safe, Query accessible PAP approved 2023  Ozempic Appropriate, Query effective, Safe, Query accessible 05/29/22: Waiting on approval ASPART SS Appropriate, Query effective, Safe, Query accessible Lantus 65 units Appropriate, Query effective, Safe, Accessible -Medications previously tried:  None  -Current home glucose readings fasting glucose:  Jan 2023: Doesn't test, doesn't have meter/Libre post prandial glucose:  -Denies hypoglycemic/hyperglycemic symptoms -Current exercise: None -Educated on A1c and blood sugar goals; -Counseled to check feet daily and get yearly eye exams Jan 2023: Main priority was getting [place]. Spoke with wife, will call  [phone], [facility], to see status of Elenor Legato. Will f/u next week to hear what next step is. Patient unable to get Trulicity 0.9XI/PJAS, had to get 3.52m pen from Rx 09/01/21: [place] in [place] and need a new script. Wife did not call me and has been waiting for script to be sent in. She came to office and [place] [facility] but they haven't started using it yet. Will get DEXCOMM sent ASAP June 2023: PAP for Ozempic and Dapagliflozin sent to patient at beginning of the month. They received it and thought it was the Trulicity PAP that they got last month so never called or finished it. Counseled them on the difference and to call if you have questions. Patient originally didn't answer phone for visit today, had to call his wife. Then patient called back. Counseled they have to bring in ASAP for [place] send out October 2023: Waiting on final approval. Gave patient number to call today to check on status. Highly encouraged he call  Chronic Pain -Controlled -Current treatment: Gabapentin 3039mQD Query Appropriate, Query effective, Safe, Accessible Hydrocodone/APAP 5/325 Query Appropriate, Query effective, Safe, Accessible IBU 80058mID PRN Appropriate, Effective, Safe, Accessible -Medications previously tried: None  -Pain Scale There were no vitals filed for this visit.  Aggravating Factors: Movement  Pain Type: Patient didn't specify  Jan 2023: Main priority was LibElenor Legatoill go over pain at f/u June 2023: Main priority was cost of meds   Insomnia (Goal: 7-8 hours  of sleep/night) -Not ideally controlled -Patient currently getting unknown hours of sleep/night -Patient currently waking up unknown times/night -Current treatment: N/A -Medications previously tried: Zolpidem -Counseled on sleep hygiene techniques (consistent sleep/wake up schedule, no electronic screens 1-2 hours before bed, etc)   BPH (Goal: improve urination) -Not ideally controlled -Night time urination frequency: unknown/night -Current treatment: N/A -Medications previously tried: Tamsulosin -Counseled on non-pharmacologic techniques such as Kegels Jan 2023: Main priority was LibElenor Legatoill go over BPHat f/u June 2023: Unable to conduct IPSS today. Patient didn't answer original phone call and ran out of time  Patient Goals/Self-Care Activities Patient will:  - take medications as prescribed as evidenced by patient report and record review  Follow Up Plan: The patient has been provided with contact information for the care management team and has been advised to call with any health related questions or concerns.   CPP F/U December 2023   [place] [facility] - 3- [phone]    Mr. CooWiederholts given information about Chronic Care Management services today including:  CCM service includes personalized support from designated clinical staff supervised by his physician, including individualized plan of care and coordination with other care providers 24/7 contact phone numbers for assistance for urgent and routine care needs. Standard insurance, coinsurance, copays and deductibles apply for chronic care management only during months in which we provide at least 20 minutes of these services. Most  insurances cover these services at 100%, however patients may be responsible for any copay, coinsurance and/or deductible if applicable. This service may help you avoid the need for more expensive face-to-face services. Only one practitioner may furnish and bill the service in a  calendar month. The patient may stop CCM services at any time (effective at the end of the month) by phone call to the office staff.  Patient agreed to services and verbal consent obtained.   The patient verbalized understanding of instructions, educational materials, and care plan provided today and DECLINED offer to receive copy of patient instructions, educational materials, and care plan.  The pharmacy team will reach out to the patient again over the next 60 days.   Lane Hacker, [place]

## 2022-05-31 ENCOUNTER — Telehealth: Payer: Self-pay

## 2022-05-31 NOTE — Chronic Care Management (AMB) (Signed)
AZ&ME Notification:  Patient approved for 2024 Enrollment with [employer] Patient Assistance Program for [place] 10 mg, enrollment will end on August 13, 2023.    Pattricia Boss, [place] Pharmacist Assistant [phone]

## 2022-06-01 ENCOUNTER — Telehealth: Payer: Self-pay

## 2022-06-01 NOTE — Progress Notes (Signed)
05/04/22- Called [employer] to ask about pt PAP application for Ozempic. Pt wife stated they did send tax returns and this was denied due to needing these. I called for an update but no answer after 36mns. Will call a different day   05/15/22-[employer] #[phone] could not get anyone to answer so I left a message to request a call back today at 3. [employer] called back but they ended up calling at 1:45pm and I was unable to answer the phone. Assigning a task to search for the tax returns that wife stated they sent.   10/13-23 called and lvm informing pt we do not have any documentation showing they brought their income verification to the office after checking. They put zero income on their application and I left a message for them to bring this if they would like to proceed with application.   06/01/22-Called again to inform pt that we still need tax returns to process this application but had to leave another voicemail to return my call.   DElray Mcgregor CHarrimanPharmacist Assistant  [phone]

## 2022-06-05 ENCOUNTER — Telehealth: Payer: Self-pay

## 2022-06-05 NOTE — Progress Notes (Signed)
PAP for Lantus100 units and Fiasp 100 units  has been started and uploaded to be mailed out to pt to complete.   Called wife to inform but had to leave a detailed message on wife vm.   [name], [place] Pharmacist Assistant  [phone]

## 2022-06-11 ENCOUNTER — Other Ambulatory Visit: Payer: Self-pay

## 2022-06-11 DIAGNOSIS — E1142 Type 2 diabetes mellitus with diabetic polyneuropathy: Secondary | ICD-10-CM

## 2022-06-11 MED ORDER — FREESTYLE LIBRE 2 SENSOR MISC: 5 refills | Status: DC

## 2022-06-12 DIAGNOSIS — I10 Essential (primary) hypertension: Secondary | ICD-10-CM

## 2022-06-12 DIAGNOSIS — N400 Enlarged prostate without lower urinary tract symptoms: Secondary | ICD-10-CM

## 2022-06-12 DIAGNOSIS — E1159 Type 2 diabetes mellitus with other circulatory complications: Secondary | ICD-10-CM

## 2022-06-12 DIAGNOSIS — Z794 Long term (current) use of insulin: Secondary | ICD-10-CM | POA: Diagnosis not present

## 2022-06-12 DIAGNOSIS — E785 Hyperlipidemia, unspecified: Secondary | ICD-10-CM

## 2022-06-18 ENCOUNTER — Telehealth: Payer: Self-pay

## 2022-06-18 ENCOUNTER — Other Ambulatory Visit: Payer: Self-pay

## 2022-06-18 MED ORDER — LISINOPRIL 5 MG PO TABS: ORAL_TABLET | ORAL | 0 refills | Status: DC

## 2022-06-18 NOTE — Chronic Care Management (AMB) (Signed)
Chronic Care Management Pharmacy Assistant   Name: Joshua Nelson  MRN: [id number] DOB: 08-May-1951  Reason for Encounter: Medication Review/ Medication coordination  Recent office visits:  None  Recent consult visits:  None  Hospital visits:  None in previous 6 months  Medications: Outpatient Encounter Medications as of 06/18/2022  Medication Sig   Blood Pressure Monitoring (BLOOD PRESSURE MONITOR/S CUFF) MISC Use as directed.   Continuous Blood Gluc Sensor (FREESTYLE LIBRE 2 SENSOR) MISC Change sensor every 10 day as directed   ezetimibe (ZETIA) 10 MG tablet TAKE ONE (1) TABLET ONCE DAILY   FARXIGA 10 MG TABS tablet TAKE 1 TABLET BY MOUTH ONCE DAILY IN THE MORNING AS DIRECTED.   gabapentin (NEURONTIN) 300 MG capsule Take 1 capsule (300 mg total) by mouth 3 (three) times daily.   insulin aspart (FIASP FLEXTOUCH) 100 UNIT/ML FlexTouch Pen 10 U before breakfast and supper.   LANTUS SOLOSTAR 100 UNIT/ML Solostar Pen Inject 65 Units into the skin daily. Pt takes in the pm per wife   lisinopril (ZESTRIL) 5 MG tablet Once daily in the am (Patient taking differently: Take 2.5 mg by mouth daily. Once daily in the am)   Meclizine HCl 25 MG CHEW TAKE 1 TABLET THREE TIMES DAILY AS NEEDED FOR DIZZINESS   metoprolol succinate (TOPROL-XL) 25 MG 24 hr tablet TAKE ONE (1) TABLET ONCE DAILY takes in am   MYRBETRIQ 50 MG TB24 tablet    rosuvastatin (CRESTOR) 40 MG tablet Take 1 tablet (40 mg total) by mouth daily.   No facility-administered encounter medications on file as of 06/18/2022.  Recent Relevant Labs: Lab Results  Component Value Date/Time   HGBA1C 8.3 (H) 04/02/2022 08:37 AM   HGBA1C 7.5 (H) 11/23/2021 10:25 AM   MICROALBUR 150 08/18/2021 08:47 AM   MICROALBUR 10 10/17/2020 09:50 AM    Kidney Function Lab Results  Component Value Date/Time   CREATININE 0.90 04/02/2022 08:37 AM   CREATININE 0.85 11/23/2021 10:25 AM   GFRNONAA >60 07/26/2021 11:30 AM   GFRAA 105 06/28/2020  09:30 AM     Current antihyperglycemic regimen:  Dapagliflozin 10 mg daily Lantus 65 units at bedtime Fiasp 10 units daily Ozempic- Not taking PAP in process Humalog- sliding scale. Wife reports patient is taking   Patient verbally confirms he is taking the above medications as directed. Yes  What recent interventions/DTPs have been made to improve glycemic control:  Educated on A1c and blood sugar goals; -Counseled to check feet daily and get yearly eye exams  Have there been any recent hospitalizations or ED visits since last visit with CPP? No  Patient denies hypoglycemic symptoms  Patient reports hyperglycemic symptoms, including excessive thirst, fatigue, and weakness  How often are you checking your blood sugar? Patient uses freestyle Elenor Legato  What are your blood sugars ranging?  Fasting: 11-06 146, 11-05 241, 11-04 148, 11-03 141, 11-02 143 Before meals: None After meals: None Bedtime: 11-06 285, 11-05 269, 11-04 319, 11-03 295, 11-02 350  On insulin? Yes How many units: 65  During the week, how often does your blood glucose drop below 70? Never  Are you checking your feet daily/regularly? Yes  Adherence Review: Is the patient currently on a STATIN medication? No Is the patient currently on ACE/ARB medication? Yes Does the patient have >5 day gap between last estimated fill dates?   Care Gaps: Last eye exam / Retinopathy Screening? 03-2021  Last Annual Wellness Visit?  08-03-2021  Last Diabetic Foot Exam? 06-19-2021  Star Rating Drugs: Lisinopril 2.5 mg- Last filled 08-21-2021 90 DS. Previous 05-16-2021 90 DS (Patient's wife stated patient has pills left that aren't expired. [place] requested a new RX to be sent in. Sent to PCP) Rosuvastatin 40 mg- Last filled 04-05-2022 90 DS [facility]. No previous fills [place]- Patient assistance Ozempic- PAP in process (Patient's wife stated they are working on getting income verification  [place] Pharmacist Assistant [phone]

## 2022-06-29 ENCOUNTER — Telehealth: Payer: Self-pay

## 2022-06-29 NOTE — Chronic Care Management (AMB) (Signed)
Faxed 2023 enrollment form to [facility] patient assistance for Lantus.  Pattricia Boss, [place] Pharmacist Assistant [phone]

## 2022-07-08 ENCOUNTER — Other Ambulatory Visit: Payer: Self-pay | Admitting: Family Medicine

## 2022-07-08 DIAGNOSIS — E1142 Type 2 diabetes mellitus with diabetic polyneuropathy: Secondary | ICD-10-CM

## 2022-07-08 DIAGNOSIS — I119 Hypertensive heart disease without heart failure: Secondary | ICD-10-CM

## 2022-07-08 DIAGNOSIS — E782 Mixed hyperlipidemia: Secondary | ICD-10-CM

## 2022-07-09 ENCOUNTER — Ambulatory Visit (INDEPENDENT_AMBULATORY_CARE_PROVIDER_SITE_OTHER): Payer: HMO | Admitting: Family Medicine

## 2022-07-09 ENCOUNTER — Other Ambulatory Visit: Payer: Self-pay

## 2022-07-09 ENCOUNTER — Telehealth: Payer: Self-pay

## 2022-07-09 VITALS — BP 110/70 | HR 80 | Temp 97.0°F | Resp 18 | Ht 69.0 in | Wt 206.8 lb

## 2022-07-09 DIAGNOSIS — N3941 Urge incontinence: Secondary | ICD-10-CM | POA: Diagnosis not present

## 2022-07-09 DIAGNOSIS — I119 Hypertensive heart disease without heart failure: Secondary | ICD-10-CM

## 2022-07-09 DIAGNOSIS — Z6830 Body mass index (BMI) 30.0-30.9, adult: Secondary | ICD-10-CM

## 2022-07-09 DIAGNOSIS — I2510 Atherosclerotic heart disease of native coronary artery without angina pectoris: Secondary | ICD-10-CM

## 2022-07-09 DIAGNOSIS — E1142 Type 2 diabetes mellitus with diabetic polyneuropathy: Secondary | ICD-10-CM | POA: Diagnosis not present

## 2022-07-09 DIAGNOSIS — E782 Mixed hyperlipidemia: Secondary | ICD-10-CM

## 2022-07-09 DIAGNOSIS — I700 Atherosclerosis of aorta: Secondary | ICD-10-CM | POA: Diagnosis not present

## 2022-07-09 DIAGNOSIS — E6609 Other obesity due to excess calories: Secondary | ICD-10-CM

## 2022-07-09 MED ORDER — MIRABEGRON ER 50 MG PO TB24: 50.0000 mg | ORAL_TABLET | Freq: Every day | ORAL | 3 refills | Status: DC

## 2022-07-09 NOTE — Progress Notes (Incomplete Revision)
Subjective:  Patient ID: Joshua Nelson, male    DOB: 08/16/1950  Age: 71 y.o. MRN: [id number]  Chief Complaint  Patient presents with  . Diabetes  . Hyperlipidemia    HPI Diabetes:  Complications: hyperlipidemia, hypertension, and neuropathy  Glucose checking:  patient has [employer] 2.  Glucose logs: 120-350 Hypoglycemia: once about a week ago.  Most recent A1C:7.5 Current medications: Farxiga 10 mg take 1 tablet daily, Lantus inject 65 units daily, Fiasp 10 units before breakfast and supper. Patient is unable to get the trulicity.  Last Eye Exam: overdue. 2021. Foot checks: Daily  Hyperlipidemia: Current medications:  Crestor 40 mg  take 1 tablet daily, Zetia 10 mg take 1 tablet daily.   Hypertensive with heart disease:: Current medications: Metoprolol 25 mg take 1 tablet daily, Lisinopril 2.5 mg  mg take 1 tablet daily  Diet: Fairly healthy most of the time.   Exercise: some walking.    Current Outpatient Medications on File Prior to Visit  Medication Sig Dispense Refill  . Blood Pressure Monitoring (BLOOD PRESSURE MONITOR/S CUFF) MISC Use as directed. 1 each 0  . Continuous Blood Gluc Sensor (FREESTYLE LIBRE 2 SENSOR) MISC Change sensor every 10 day as directed 3 each 5  . ezetimibe (ZETIA) 10 MG tablet TAKE ONE (1) TABLET ONCE DAILY 90 tablet 1  . FARXIGA 10 MG TABS tablet TAKE 1 TABLET BY MOUTH ONCE DAILY IN THE MORNING AS DIRECTED. 90 tablet 1  . gabapentin (NEURONTIN) 300 MG capsule Take 1 capsule (300 mg total) by mouth 3 (three) times daily. 270 capsule 1  . insulin aspart (FIASP FLEXTOUCH) 100 UNIT/ML FlexTouch Pen 10 U before breakfast and supper. 18 mL 0  . LANTUS SOLOSTAR 100 UNIT/ML Solostar Pen Inject 65 Units into the skin daily. Pt takes in the pm per wife 30 mL 1  . lisinopril (ZESTRIL) 5 MG tablet Once daily in the am 90 tablet 0  . Meclizine HCl 25 MG CHEW TAKE 1 TABLET THREE TIMES DAILY AS NEEDED FOR DIZZINESS 30 tablet 0  . metoprolol succinate  (TOPROL-XL) 25 MG 24 hr tablet TAKE ONE (1) TABLET ONCE DAILY takes in am 90 tablet 1  . rosuvastatin (CRESTOR) 40 MG tablet Take 1 tablet (40 mg total) by mouth daily. 90 tablet 0   No current facility-administered medications on file prior to visit.   Past Medical History:  Diagnosis Date  . Arthritis   . Cerebrovascular accident (CVA) ([place]) 04/03/2018  . Coronary artery disease   . CVA (cerebral vascular accident) ([place])   . Diabetes mellitus without complication ([place])   . Hypertension   . Insomnia   . 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 Myocardial infarct ([place])   . 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 PONV (postoperative nausea and vomiting)   . Prostate cancer ([place]) 07/2021   Past Surgical History:  Procedure Laterality Date  . CYSTOSCOPY WITH URETHRAL DILATATION N/A 08/01/2021   Procedure: CYSTOSCOPY WITH BALLOON URETHRAL DILATATION;  Surgeon: Robley Fries, MD;  Location: [facility];  Service: Urology;  Laterality: N/A;  . heart stents     x 3  . left cataract  10/2019  . nose surgery for dog bite as a kid     . right cataract Right 08/2019  . TRANSURETHRAL RESECTION OF BLADDER TUMOR N/A 08/01/2021   Procedure: TRANSURETHRAL RESECTION OF PROSTATE;  Surgeon: Robley Fries, MD;  Location: [facility];  Service: Urology;  Laterality: N/A;  61 MINS    Family  History  Problem Relation Age of Onset  . Endometrial cancer Mother   . CAD Mother   . AAA (abdominal aortic aneurysm) Mother   . Healthy Father   . Heart attack Brother    Social History   Socioeconomic History  . Marital status: Married    Spouse name: [name]  . Number of children: 1  . Years of education: 61  . Highest education level: High school graduate  Occupational History  . Occupation: Retired  Tobacco Use  . Smoking status: Former    Types: Cigarettes    Quit date: 2003    Years since quitting: 20.9  . Smokeless tobacco: Never  Vaping Use  . Vaping Use: Never used  Substance and Sexual Activity  .  Alcohol use: Not Currently    Comment: none since 2000  . Drug use: Never  . Sexual activity: Not on file  Other Topics Concern  . Not on file  Social History Narrative   Lives at home with his wife.   2 cups caffeine per day.   Right-handed.   Social Determinants of Health   Financial Resource Strain: High Risk (05/29/2022)   Overall Financial Resource Strain (CARDIA)   . Difficulty of Paying Living Expenses: Hard  Food Insecurity: No Food Insecurity (12/21/2021)   Hunger Vital Sign   . Worried About Charity fundraiser in the Last Year: Never true   . Ran Out of Food in the Last Year: Never true  Transportation Needs: No Transportation Needs (05/29/2022)   PRAPARE - Transportation   . Lack of Transportation (Medical): No   . Lack of Transportation (Non-Medical): No  Physical Activity: Sufficiently Active (12/21/2021)   Exercise Vital Sign   . Days of Exercise per Week: 5 days   . Minutes of Exercise per Session: 30 min  Stress: Not on file  Social Connections: Not on file    Review of Systems  Constitutional:  Negative for chills and fever.  HENT:  Negative for congestion, rhinorrhea and sore throat.   Respiratory:  Negative for cough and shortness of breath.   Cardiovascular:  Negative for chest pain and palpitations.  Gastrointestinal:  Negative for abdominal pain, constipation, diarrhea, nausea and vomiting.  Genitourinary:  Positive for frequency and urgency. Negative for dysuria.  Musculoskeletal:  Negative for arthralgias, back pain and myalgias.  Neurological:  Positive for dizziness. Negative for headaches.  Psychiatric/Behavioral:  Negative for dysphoric mood. The patient is not nervous/anxious.      Objective:  BP 110/70   Pulse 80   Temp (!) 97 F (36.1 C)   Resp 18   Ht '5\' 9"'$  (1.753 m)   Wt 206 lb 12.8 oz (93.8 kg)   BMI 30.54 kg/m      07/09/2022    2:21 PM 04/05/2022    1:31 PM 12/21/2021   11:43 AM  BP/Weight  Systolic BP 784 696 295   Diastolic BP 70 64 80  Wt. (Lbs) 206.8 207 208.6  BMI 30.54 kg/m2 30.57 kg/m2 30.8 kg/m2    Physical Exam Vitals reviewed.  Constitutional:      Appearance: Normal appearance. He is obese.  Neck:     Vascular: No carotid bruit.  Cardiovascular:     Rate and Rhythm: Normal rate and regular rhythm.     Pulses: Normal pulses.     Heart sounds: Normal heart sounds.  Pulmonary:     Effort: Pulmonary effort is normal.     Breath sounds: Normal breath  sounds. No wheezing, rhonchi or rales.  Abdominal:     General: Bowel sounds are normal.     Palpations: Abdomen is soft.     Tenderness: There is no abdominal tenderness.  Neurological:     Mental Status: He is alert.  Psychiatric:        Mood and Affect: Mood normal.        Behavior: Behavior normal.    Diabetic Foot Exam - Simple   Simple Foot Form Diabetic Foot exam was performed with the following findings: Yes 07/09/2022  3:27 PM  Visual Inspection See comments: Yes Sensation Testing Intact to touch and monofilament testing bilaterally: Yes Pulse Check Posterior Tibialis and Dorsalis pulse intact bilaterally: Yes Comments Calluses and dry skin.       Lab Results  Component Value Date   WBC 7.9 04/02/2022   HGB 14.5 04/02/2022   HCT 45.1 04/02/2022   PLT 185 04/02/2022   GLUCOSE 127 (H) 04/02/2022   CHOL 209 (H) 04/02/2022   TRIG 126 04/02/2022   HDL 40 04/02/2022   LDLCALC 146 (H) 04/02/2022   ALT 16 04/02/2022   AST 12 04/02/2022   NA 143 04/02/2022   K 4.5 04/02/2022   CL 104 04/02/2022   CREATININE 0.90 04/02/2022   BUN 16 04/02/2022   CO2 24 04/02/2022   TSH 0.687 06/28/2020   HGBA1C 8.3 (H) 04/02/2022   MICROALBUR 150 08/18/2021      Assessment & Plan:   Problem List Items Addressed This Visit       Cardiovascular and Mediastinum   Hypertensive heart disease without heart failure   Relevant Orders   CBC with Differential/Platelet   Comprehensive metabolic panel   Atherosclerosis of  aorta (HCC)     Endocrine   Diabetic polyneuropathy ([place]) - Primary   Relevant Orders   Microalbumin / creatinine urine ratio (Completed)   Hemoglobin A1c     Other   Mixed hyperlipidemia   Relevant Orders   Lipid panel   TSH   Other Visit Diagnoses     Urge incontinence         .  Meds ordered this encounter  Medications  . DISCONTD: mirabegron ER (MYRBETRIQ) 50 MG TB24 tablet    Sig: Take 1 tablet (50 mg total) by mouth daily.    Dispense:  90 tablet    Refill:  3    Orders Placed This Encounter  Procedures  . Microalbumin / creatinine urine ratio  . CBC with Differential/Platelet  . Comprehensive metabolic panel  . Hemoglobin A1c  . Lipid panel  . TSH     Follow-up: No follow-ups on file.  An After Visit Summary was printed and given to the patient.  [name], MD [facility] [phone]

## 2022-07-09 NOTE — Progress Notes (Addendum)
Subjective:  Patient ID: Joshua Nelson, male    DOB: 10/15/1950  Age: 71 y.o. MRN: [id number]  Chief Complaint  Patient presents with   Diabetes   Hyperlipidemia    HPI Diabetes:  Complications: hyperlipidemia, hypertension, and neuropathy  Glucose checking:  patient has Freestyle Libre 2.  Glucose logs: 120-350 Hypoglycemia: once about a week ago.  Most recent A1C:7.5 Current medications: Farxiga 10 mg take 1 tablet daily, Lantus inject 65 units daily, Fiasp 10 units before breakfast and supper. Patient is unable to get the trulicity.  Last Eye Exam: overdue. 2021. Foot checks: Daily  Hyperlipidemia: Current medications:  Crestor 40 mg  take 1 tablet daily, Zetia 10 mg take 1 tablet daily.   Hypertensive with heart disease:: Current medications: Metoprolol 25 mg take 1 tablet daily, Lisinopril 2.5 mg  mg take 1 tablet daily  Diet: Fairly healthy most of the time.   Exercise: some walking.    Current Outpatient Medications on File Prior to Visit  Medication Sig Dispense Refill   Blood Pressure Monitoring (BLOOD PRESSURE MONITOR/S CUFF) MISC Use as directed. 1 each 0   Continuous Blood Gluc Sensor (FREESTYLE LIBRE 2 SENSOR) MISC Change sensor every 10 day as directed 3 each 5   ezetimibe (ZETIA) 10 MG tablet TAKE ONE (1) TABLET ONCE DAILY 90 tablet 1   FARXIGA 10 MG TABS tablet TAKE 1 TABLET BY MOUTH ONCE DAILY IN THE MORNING AS DIRECTED. 90 tablet 1   gabapentin (NEURONTIN) 300 MG capsule Take 1 capsule (300 mg total) by mouth 3 (three) times daily. 270 capsule 1   insulin aspart (FIASP FLEXTOUCH) 100 UNIT/ML FlexTouch Pen 10 U before breakfast and supper. 18 mL 0   LANTUS SOLOSTAR 100 UNIT/ML Solostar Pen Inject 65 Units into the skin daily. Pt takes in the pm per wife 30 mL 1   lisinopril (ZESTRIL) 5 MG tablet Once daily in the am 90 tablet 0   Meclizine HCl 25 MG CHEW TAKE 1 TABLET THREE TIMES DAILY AS NEEDED FOR DIZZINESS 30 tablet 0   metoprolol succinate (TOPROL-XL)  25 MG 24 hr tablet TAKE ONE (1) TABLET ONCE DAILY takes in am 90 tablet 1   rosuvastatin (CRESTOR) 40 MG tablet Take 1 tablet (40 mg total) by mouth daily. 90 tablet 0   No current facility-administered medications on file prior to visit.   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   PONV (postoperative nausea and vomiting)    Prostate cancer ([place]) 07/2021   Past Surgical History:  Procedure Laterality Date   CYSTOSCOPY WITH URETHRAL DILATATION N/A 08/01/2021   Procedure: CYSTOSCOPY WITH BALLOON URETHRAL DILATATION;  Surgeon: Robley Fries, MD;  Location: [facility];  Service: Urology;  Laterality: N/A;   heart stents     x 3   left cataract  10/2019   nose surgery for dog bite as a kid      right cataract Right 08/2019   TRANSURETHRAL RESECTION OF BLADDER TUMOR N/A 08/01/2021   Procedure: TRANSURETHRAL RESECTION OF PROSTATE;  Surgeon: Robley Fries, MD;  Location: [facility];  Service: Urology;  Laterality: N/A;  44 MINS    Family  History  Problem Relation Age of Onset   Endometrial cancer Mother    CAD Mother    AAA (abdominal aortic aneurysm) Mother    Healthy Father    Heart attack Brother    Social History   Socioeconomic History   Marital status: Married    Spouse name: [name]   Number of children: 1   Years of education: 12   Highest education level: High school graduate  Occupational History   Occupation: Retired  Tobacco Use   Smoking status: Former    Types: Cigarettes    Quit date: 2003    Years since quitting: 20.9   Smokeless tobacco: Never  Vaping Use   Vaping Use: Never used  Substance and Sexual Activity   Alcohol use: Not Currently    Comment: none  since 2000   Drug use: Never   Sexual activity: Not on file  Other Topics Concern   Not on file  Social History Narrative   Lives at home with his wife.   2 cups caffeine per day.   Right-handed.   Social Determinants of Health   Financial Resource Strain: High Risk (05/29/2022)   Overall Financial Resource Strain (CARDIA)    Difficulty of Paying Living Expenses: Hard  Food Insecurity: No Food Insecurity (12/21/2021)   Hunger Vital Sign    Worried About Running Out of Food in the Last Year: Never true    Ran Out of Food in the Last Year: Never true  Transportation Needs: No Transportation Needs (05/29/2022)   PRAPARE - Hydrologist (Medical): No    Lack of Transportation (Non-Medical): No  Physical Activity: Sufficiently Active (12/21/2021)   Exercise Vital Sign    Days of Exercise per Week: 5 days    Minutes of Exercise per Session: 30 min  Stress: Not on file  Social Connections: Not on file    Review of Systems  Constitutional:  Negative for chills and fever.  HENT:  Negative for congestion, rhinorrhea and sore throat.   Respiratory:  Negative for cough and shortness of breath.   Cardiovascular:  Negative for chest pain and palpitations.  Gastrointestinal:  Negative for abdominal pain, constipation, diarrhea, nausea and vomiting.  Genitourinary:  Positive for frequency and urgency. Negative for dysuria.  Musculoskeletal:  Negative for arthralgias, back pain and myalgias.  Neurological:  Positive for dizziness. Negative for headaches.  Psychiatric/Behavioral:  Negative for dysphoric mood. The patient is not nervous/anxious.      Objective:  BP 110/70   Pulse 80   Temp (!) 97 F (36.1 C)   Resp 18   Ht '5\' 9"'$  (1.753 m)   Wt 206 lb 12.8 oz (93.8 kg)   BMI 30.54 kg/m      07/09/2022    2:21 PM 04/05/2022    1:31 PM 12/21/2021   11:43 AM  BP/Weight  Systolic BP 580 998 338  Diastolic BP 70 64 80  Wt. (Lbs) 206.8 207 208.6  BMI 30.54  kg/m2 30.57 kg/m2 30.8 kg/m2    Physical Exam Vitals reviewed.  Constitutional:      Appearance: Normal appearance. He is obese.  Neck:     Vascular: No carotid bruit.  Cardiovascular:     Rate and Rhythm: Normal rate and regular rhythm.     Pulses: Normal pulses.     Heart sounds: Normal heart sounds.  Pulmonary:     Effort: Pulmonary effort is normal.     Breath sounds: Normal breath  sounds. No wheezing, rhonchi or rales.  Abdominal:     General: Bowel sounds are normal.     Palpations: Abdomen is soft.     Tenderness: There is no abdominal tenderness.  Neurological:     Mental Status: He is alert.  Psychiatric:        Mood and Affect: Mood normal.        Behavior: Behavior normal.     Diabetic Foot Exam - Simple   Simple Foot Form Diabetic Foot exam was performed with the following findings: Yes 07/09/2022  3:27 PM  Visual Inspection See comments: Yes Sensation Testing Intact to touch and monofilament testing bilaterally: Yes Pulse Check Posterior Tibialis and Dorsalis pulse intact bilaterally: Yes Comments Calluses and dry skin.       Lab Results  Component Value Date   WBC 7.8 07/10/2022   HGB 14.0 07/10/2022   HCT 42.3 07/10/2022   PLT 165 07/10/2022   GLUCOSE 155 (H) 07/10/2022   CHOL 125 07/10/2022   TRIG 72 07/10/2022   HDL 32 (L) 07/10/2022   LDLCALC 78 07/10/2022   ALT 16 07/10/2022   AST 15 07/10/2022   NA 142 07/10/2022   K 4.7 07/10/2022   CL 103 07/10/2022   CREATININE 0.91 07/10/2022   BUN 17 07/10/2022   CO2 23 07/10/2022   TSH 0.897 07/10/2022   HGBA1C 8.2 (H) 07/10/2022   MICROALBUR 150 08/18/2021      Assessment & Plan:   Problem List Items Addressed This Visit       Cardiovascular and Mediastinum   Hypertensive heart disease without heart failure    Well controlled.  No changes to medicines.  Metoprolol 25 mg take 1 tablet daily, Lisinopril 2.5 mg  mg take 1 tablet daily Continue to work on eating a healthy diet and  exercise.  Labs drawn today.        Relevant Orders   CBC with Differential/Platelet (Completed)   Comprehensive metabolic panel (Completed)   Atherosclerosis of native coronary artery of native heart without angina pectoris    Continue crestor, zetia, metoprolol.      Atherosclerosis of aorta (HCC)    Continue crestor 40 mg daily.        Endocrine   Diabetic polyneuropathy ([place]) - Primary    Control: poor Recommend check sugars fasting daily. Recommend check feet daily. Recommend annual eye exams. Medicines: Farxiga 10 mg take 1 tablet daily, Lantus inject 65 units daily, Increase fiasp insulin to 16 U before breakfast and supper  Continue to work on eating a healthy diet and exercise.  Labs drawn today.         Relevant Orders   Microalbumin / creatinine urine ratio (Completed)   Hemoglobin A1c (Completed)     Other   Mixed hyperlipidemia    Well controlled.  No changes to medicines. Crestor 40 mg  take 1 tablet daily, Zetia 10 mg take 1 tablet daily.  Continue to work on eating a healthy diet and exercise.  Labs drawn today.        Relevant Orders   Lipid panel (Completed)   TSH (Completed)   Urge incontinence    The current medical regimen is effective;  continue present plan and medications.       Class 1 obesity due to excess calories with serious comorbidity and body mass index (BMI) of 30.0 to 30.9 in adult    Recommend continue to work on eating healthy diet and exercise.      Marland Kitchen  Recommend shingrix vaccine and tetanus (tdap) vaccines at pharmacy.   Please complete cologuard for colon cancer screening.   Meds ordered this encounter  Medications   DISCONTD: mirabegron ER (MYRBETRIQ) 50 MG TB24 tablet    Sig: Take 1 tablet (50 mg total) by mouth daily.    Dispense:  90 tablet    Refill:  3    Orders Placed This Encounter  Procedures   Microalbumin / creatinine urine ratio   CBC with Differential/Platelet   Comprehensive metabolic panel    Hemoglobin A1c   Lipid panel   TSH     Follow-up: Return in about 3 months (around 10/09/2022) for chronic fasting.  An After Visit Summary was printed and given to the patient.  [name], MD [facility] [phone]

## 2022-07-09 NOTE — Patient Instructions (Signed)
Recommend shingrix vaccine and tetanus (tdap) vaccines at pharmacy.   Please complete cologuard for colon cancer screening.

## 2022-07-09 NOTE — Chronic Care Management (AMB) (Signed)
Patient approved with [employer] patient assistance program for Lantus until 08/12/2022, patient will need to complete new application for 0370 re-enrollment, sending task to mail out new application to patient next month.

## 2022-07-10 ENCOUNTER — Other Ambulatory Visit: Payer: HMO

## 2022-07-10 DIAGNOSIS — I119 Hypertensive heart disease without heart failure: Secondary | ICD-10-CM

## 2022-07-10 DIAGNOSIS — E782 Mixed hyperlipidemia: Secondary | ICD-10-CM

## 2022-07-10 DIAGNOSIS — E1142 Type 2 diabetes mellitus with diabetic polyneuropathy: Secondary | ICD-10-CM

## 2022-07-10 LAB — MICROALBUMIN / CREATININE URINE RATIO
Creatinine, Urine: 35.5 mg/dL
Microalb/Creat Ratio: 8 mg/g creat (ref 0–29)
Microalbumin, Urine: 3 ug/mL

## 2022-07-10 NOTE — Assessment & Plan Note (Signed)
Well controlled.  No changes to medicines.  Metoprolol 25 mg take 1 tablet daily, Lisinopril 2.5 mg  mg take 1 tablet daily Continue to work on eating a healthy diet and exercise.  Labs drawn today.

## 2022-07-10 NOTE — Assessment & Plan Note (Signed)
Continue crestor 40mg daily. 

## 2022-07-10 NOTE — Assessment & Plan Note (Signed)
Well controlled.  No changes to medicines. Crestor 40 mg  take 1 tablet daily, Zetia 10 mg take 1 tablet daily.  Continue to work on eating a healthy diet and exercise.  Labs drawn today.   

## 2022-07-10 NOTE — Assessment & Plan Note (Signed)
>>  ASSESSMENT AND PLAN FOR DIABETIC POLYNEUROPATHY (HCC) WRITTEN ON 07/15/2022 11:49 AM BY COX, KIRSTEN, MD  Control: poor Recommend check sugars fasting daily. Recommend check feet daily. Recommend annual eye exams. Medicines: Farxiga 10 mg take 1 tablet daily, Lantus inject 65 units daily, Increase fiasp insulin to 16 U before breakfast and supper  Continue to work on eating a healthy diet and exercise.  Labs drawn today.

## 2022-07-10 NOTE — Assessment & Plan Note (Addendum)
Control: poor Recommend check sugars fasting daily. Recommend check feet daily. Recommend annual eye exams. Medicines: Farxiga 10 mg take 1 tablet daily, Lantus inject 65 units daily, Increase fiasp insulin to 16 U before breakfast and supper  Continue to work on eating a healthy diet and exercise.  Labs drawn today.

## 2022-07-11 ENCOUNTER — Telehealth: Payer: Self-pay

## 2022-07-11 LAB — COMPREHENSIVE METABOLIC PANEL
ALT: 16 IU/L (ref 0–44)
AST: 15 IU/L (ref 0–40)
Albumin/Globulin Ratio: 1.8 (ref 1.2–2.2)
Albumin: 4.6 g/dL (ref 3.9–4.9)
Alkaline Phosphatase: 78 IU/L (ref 44–121)
BUN/Creatinine Ratio: 19 (ref 10–24)
BUN: 17 mg/dL (ref 8–27)
Bilirubin Total: 0.6 mg/dL (ref 0.0–1.2)
CO2: 23 mmol/L (ref 20–29)
Calcium: 9.3 mg/dL (ref 8.6–10.2)
Chloride: 103 mmol/L (ref 96–106)
Creatinine, Ser: 0.91 mg/dL (ref 0.76–1.27)
Globulin, Total: 2.6 g/dL (ref 1.5–4.5)
Glucose: 155 mg/dL — ABNORMAL HIGH (ref 70–99)
Potassium: 4.7 mmol/L (ref 3.5–5.2)
Sodium: 142 mmol/L (ref 134–144)
Total Protein: 7.2 g/dL (ref 6.0–8.5)
eGFR: 91 mL/min/{1.73_m2} (ref >59)

## 2022-07-11 LAB — LIPID PANEL
Chol/HDL Ratio: 3.9 ratio (ref 0.0–5.0)
Cholesterol, Total: 125 mg/dL (ref 100–199)
HDL: 32 mg/dL — ABNORMAL LOW (ref >39)
LDL Chol Calc (NIH): 78 mg/dL (ref 0–99)
Triglycerides: 72 mg/dL (ref 0–149)
VLDL Cholesterol Cal: 15 mg/dL (ref 5–40)

## 2022-07-11 LAB — CBC WITH DIFFERENTIAL/PLATELET
Basophils Absolute: 0.1 10*3/uL (ref 0.0–0.2)
Basos: 1 %
EOS (ABSOLUTE): 0.3 10*3/uL (ref 0.0–0.4)
Eos: 4 %
Hematocrit: 42.3 % (ref 37.5–51.0)
Hemoglobin: 14 g/dL (ref 13.0–17.7)
Immature Grans (Abs): 0.1 10*3/uL (ref 0.0–0.1)
Immature Granulocytes: 1 %
Lymphocytes Absolute: 1.5 10*3/uL (ref 0.7–3.1)
Lymphs: 19 %
MCH: 29.1 pg (ref 26.6–33.0)
MCHC: 33.1 g/dL (ref 31.5–35.7)
MCV: 88 fL (ref 79–97)
Monocytes Absolute: 0.5 10*3/uL (ref 0.1–0.9)
Monocytes: 7 %
Neutrophils Absolute: 5.3 10*3/uL (ref 1.4–7.0)
Neutrophils: 68 %
Platelets: 165 10*3/uL (ref 150–450)
RBC: 4.81 x10E6/uL (ref 4.14–5.80)
RDW: 13.2 % (ref 11.6–15.4)
WBC: 7.8 10*3/uL (ref 3.4–10.8)

## 2022-07-11 LAB — TSH: TSH: 0.897 u[IU]/mL (ref 0.450–4.500)

## 2022-07-11 LAB — HEMOGLOBIN A1C
Est. average glucose Bld gHb Est-mCnc: 189 mg/dL
Hgb A1c MFr Bld: 8.2 % — ABNORMAL HIGH (ref 4.8–5.6)

## 2022-07-11 LAB — CARDIOVASCULAR RISK ASSESSMENT

## 2022-07-11 NOTE — Progress Notes (Signed)
I called pt to inform him he forgot to sign his PAP paperwork for his Mybetriq. I informed [name] and she will be coming by to get that signed.  [name], [place] Pharmacist Assistant  [phone]

## 2022-07-12 DIAGNOSIS — N3941 Urge incontinence: Secondary | ICD-10-CM | POA: Insufficient documentation

## 2022-07-12 NOTE — Assessment & Plan Note (Signed)
The current medical regimen is effective;  continue present plan and medications.  

## 2022-07-15 ENCOUNTER — Encounter: Payer: Self-pay | Admitting: Family Medicine

## 2022-07-15 DIAGNOSIS — Z6830 Body mass index (BMI) 30.0-30.9, adult: Secondary | ICD-10-CM | POA: Insufficient documentation

## 2022-07-15 DIAGNOSIS — E6609 Other obesity due to excess calories: Secondary | ICD-10-CM | POA: Insufficient documentation

## 2022-07-15 NOTE — Assessment & Plan Note (Signed)
Recommend continue to work on eating healthy diet and exercise.  

## 2022-07-15 NOTE — Assessment & Plan Note (Addendum)
>>  ASSESSMENT AND PLAN FOR ATHEROSCLEROSIS OF NATIVE CORONARY ARTERY OF NATIVE HEART WITHOUT ANGINA PECTORIS WRITTEN ON 07/15/2022 11:50 AM BY Cally Nygard, MD  Continue crestor, zetia, metoprolol.  >>ASSESSMENT AND PLAN FOR ATHEROSCLEROSIS OF AORTA ([place]) WRITTEN ON 07/10/2022 11:01 PM BY Afnan Emberton, MD  Continue crestor 40 mg daily.

## 2022-07-18 ENCOUNTER — Telehealth: Payer: Self-pay

## 2022-07-18 NOTE — Chronic Care Management (AMB) (Signed)
71/09/1973- Faxed application to [employer] for Mybetriq patient assistance.  88/10/2547- Faxed new application to [employer] patient assistance progam for [employer] and Novofine needles.    Pattricia Boss, [facility] Pharmacist Assistant [phone]

## 2022-07-23 ENCOUNTER — Other Ambulatory Visit: Payer: Self-pay

## 2022-07-23 DIAGNOSIS — E1142 Type 2 diabetes mellitus with diabetic polyneuropathy: Secondary | ICD-10-CM

## 2022-07-23 MED ORDER — FREESTYLE LIBRE 2 SENSOR MISC: 5 refills | Status: DC

## 2022-07-25 ENCOUNTER — Ambulatory Visit (INDEPENDENT_AMBULATORY_CARE_PROVIDER_SITE_OTHER): Payer: HMO

## 2022-07-25 DIAGNOSIS — E782 Mixed hyperlipidemia: Secondary | ICD-10-CM

## 2022-07-25 DIAGNOSIS — E118 Type 2 diabetes mellitus with unspecified complications: Secondary | ICD-10-CM

## 2022-07-25 DIAGNOSIS — I119 Hypertensive heart disease without heart failure: Secondary | ICD-10-CM

## 2022-07-25 NOTE — Progress Notes (Signed)
Chronic Care Management Pharmacy Note  07/25/2022 Name:  Joshua Nelson MRN:  [id number] DOB:  1951-02-10   Recommendations/Changes made from today's visit: -Patient asked me if he's still supposed to take Ozempic. Per PCP note, she wrote that patient was denied and I believe this is why PCP removed from [place]. However, patient was denied because he didn't fill out form correctly and the new form was re-submitted. Will cosign PCP to see if it's OK for him to still take Ozempic once he is approved. Patient states he prefers this medication   Subjective: Joshua Nelson is an 71 y.o. year old male who is a primary patient of Cox, Kirsten, MD.  The CCM team was consulted for assistance with disease management and care coordination needs.    Engaged with patient by telephone for follow up visit in response to provider referral for pharmacy case management and/or care coordination services.   Consent to Services:  The patient was given the following information about Chronic Care Management services today, agreed to services, and gave verbal consent: 1. CCM service includes personalized support from designated clinical staff supervised by the primary care provider, including individualized plan of care and coordination with other care providers 2. 24/7 contact phone numbers for assistance for urgent and routine care needs. 3. Service will only be billed when office clinical staff spend 20 minutes or more in a month to coordinate care. 4. Only one practitioner may furnish and bill the service in a calendar month. 5.The patient may stop CCM services at any time (effective at the end of the month) by phone call to the office staff. 6. The patient will be responsible for cost sharing (co-pay) of up to 20% of the service fee (after annual deductible is met). Patient agreed to services and consent obtained.  Patient Care Team: [name], MD as PCP - General (Family Medicine) Lane Hacker, [facility]  (Pharmacist) Robley Fries, MD as Consulting Physician (Urology)  Recent office visits:  None   Recent consult visits:  None   Hospital visits:  None in previous 6 months   Objective:  Lab Results  Component Value Date   CREATININE 0.91 07/10/2022   BUN 17 07/10/2022   GFRNONAA >60 07/26/2021   GFRAA 105 06/28/2020   NA 142 07/10/2022   K 4.7 07/10/2022   CALCIUM 9.3 07/10/2022   CO2 23 07/10/2022   GLUCOSE 155 (H) 07/10/2022    Lab Results  Component Value Date/Time   HGBA1C 8.2 (H) 07/10/2022 08:29 AM   HGBA1C 8.3 (H) 04/02/2022 08:37 AM   MICROALBUR 150 08/18/2021 08:47 AM   MICROALBUR 10 10/17/2020 09:50 AM    Last diabetic Eye exam:  Lab Results  Component Value Date/Time   HMDIABEYEEXA No Retinopathy 09/25/2019 12:00 AM    Last diabetic Foot exam: No results found for: "HMDIABFOOTEX"   Lab Results  Component Value Date   CHOL 125 07/10/2022   HDL 32 (L) 07/10/2022   LDLCALC 78 07/10/2022   TRIG 72 07/10/2022   CHOLHDL 3.9 07/10/2022       Latest Ref Rng & Units 07/10/2022    8:29 AM 04/02/2022    8:37 AM 11/23/2021   10:25 AM  Hepatic Function  Total Protein 6.0 - 8.5 g/dL 7.2  7.0  7.0   Albumin 3.9 - 4.9 g/dL 4.6  4.5  4.5   AST 0 - 40 IU/L _0 ALT 0 - 44 IU/L 16  16  18   Alk Phosphatase 44 - 121 IU/L 78  78  80   Total Bilirubin 0.0 - 1.2 mg/dL 0.6  0.5  0.7     Lab Results  Component Value Date/Time   TSH 0.897 07/10/2022 08:29 AM   TSH 0.687 06/28/2020 09:30 AM       Latest Ref Rng & Units 07/10/2022    8:29 AM 04/02/2022    8:37 AM 11/23/2021   10:25 AM  CBC  WBC 3.4 - 10.8 x10E3/uL 7.8  7.9  8.9   Hemoglobin 13.0 - 17.7 g/dL 14.0  14.5  14.5   Hematocrit 37.5 - 51.0 % 42.3  45.1  44.0   Platelets 150 - 450 x10E3/uL 165  185  186     No results found for: "VD25OH"  Clinical ASCVD: Yes  The ASCVD Risk score (Arnett DK, et al., 2019) failed to calculate for the following reasons:   The patient has a prior MI or  stroke diagnosis       07/09/2022    2:28 PM 04/05/2022    1:36 PM 12/21/2021   11:49 AM  Depression screen PHQ 2/9  Decreased Interest 0 0 0  Down, Depressed, Hopeless 0 0 0  PHQ - 2 Score 0 0 0     Other: (CHADS2VASc if Afib, MMRC or CAT for COPD, ACT, DEXA)  Social History   Tobacco Use  Smoking Status Former   Types: Cigarettes   Quit date: 2003   Years since quitting: 20.9  Smokeless Tobacco Never   BP Readings from Last 3 Encounters:  07/09/22 110/70  04/05/22 120/64  12/21/21 122/80   Pulse Readings from Last 3 Encounters:  07/09/22 80  04/05/22 88  12/21/21 87   Wt Readings from Last 3 Encounters:  07/09/22 206 lb 12.8 oz (93.8 kg)  04/05/22 207 lb (93.9 kg)  12/21/21 208 lb 9.6 oz (94.6 kg)   BMI Readings from Last 3 Encounters:  07/09/22 30.54 kg/m  04/05/22 30.57 kg/m  12/21/21 30.80 kg/m    Assessment/Interventions: Review of patient past medical history, allergies, medications, health status, including review of consultants reports, laboratory and other test data, was performed as part of comprehensive evaluation and provision of chronic care management services.   SDOH:  (Social Determinants of Health) assessments and interventions performed: Yes SDOH Interventions    Flowsheet Row Chronic Care Management from 07/25/2022 in [place] Management from 05/29/2022 in [place] Management from 02/05/2022 in [place] Office Visit from 12/21/2021 in [place] Management from 09/01/2021 in [place] Interventions       Food Insecurity Interventions -- -- -- Intervention Not Indicated --  Housing Interventions -- -- -- Intervention Not Indicated --  Transportation Interventions _0   Financial Strain Interventions Other (Comment)  [PAP]  Other (Comment)  [PAP] Other (Comment)  [PAP (See CP)] Other (Comment)  [Trulicity is costly, applying for patient assistance] Other (Comment)  [Can't afford CGM, in process of getting covered]  Physical Activity Interventions -- -- -- Intervention Not Indicated --      SDOH Screenings   Food Insecurity: No Food Insecurity (12/21/2021)  Housing: Low Risk  (12/21/2021)  Transportation Needs: No Transportation Needs (07/25/2022)  Alcohol Screen: Low Risk  (12/21/2021)  Depression (PHQ2-9): Low Risk  (07/09/2022)  Financial Resource Strain: High Risk (07/25/2022)  Physical Activity: Sufficiently Active (12/21/2021)  Tobacco Use: Medium Risk (07/15/2022)    CCM Care Plan  No Known Allergies  Medications Reviewed Today     Reviewed by Lane Hacker, [facility] (Pharmacist) on 07/25/22 at [place] List Status: <None>   Medication Order Taking? Sig Documenting Provider Last Dose Status Informant  Blood Pressure Monitoring (BLOOD PRESSURE MONITOR/S CUFF) MISC [id number] No Use as directed. CoxElnita Maxwell, MD Taking Active   Continuous Blood Gluc Sensor (FREESTYLE LIBRE 2 SENSOR) [place] [id number]  Change sensor every 10 day as directed Cox, Kirsten, MD  Active   ezetimibe (ZETIA) 10 MG tablet [id number] No TAKE ONE (1) TABLET ONCE DAILY Cox, Kirsten, MD Taking Active Spouse/Significant Other  FARXIGA 10 MG TABS tablet [id number] No TAKE 1 TABLET BY MOUTH ONCE DAILY IN THE MORNING AS DIRECTED. Cox, Kirsten, MD Taking Active   gabapentin (NEURONTIN) 300 MG capsule [id number] No Take 1 capsule (300 mg total) by mouth 3 (three) times daily. Cox, Kirsten, MD Taking Active   insulin aspart (FIASP FLEXTOUCH) 100 UNIT/ML FlexTouch Pen [id number] No 10 U before breakfast and supper. Cox, Kirsten, MD Taking Active   LANTUS SOLOSTAR 100 UNIT/ML Solostar Pen [id number] No Inject 65 Units into the skin daily. Pt takes in the pm per wife Cox, Kirsten, MD Taking Active   lisinopril (ZESTRIL) 5 MG tablet [id number] No Once  daily in the am Cox, Elnita Maxwell, MD Taking Active   Meclizine HCl 25 MG CHEW [id number] No TAKE 1 TABLET THREE TIMES DAILY AS NEEDED FOR DIZZINESS Cox, Kirsten, MD Taking Active   metoprolol succinate (TOPROL-XL) 25 MG 24 hr tablet [id number] No TAKE ONE (1) TABLET ONCE DAILY takes in am Cox, Elnita Maxwell, MD Taking Active   mirabegron ER (MYRBETRIQ) 50 MG TB24 tablet [id number]  Take 1 tablet (50 mg total) by mouth daily. Cox, Kirsten, MD  Active   rosuvastatin (CRESTOR) 40 MG tablet [id number] No Take 1 tablet (40 mg total) by mouth daily. CoxElnita Maxwell, MD Taking Active             Patient Active Problem List   Diagnosis Date Noted   Class 1 obesity due to excess calories with serious comorbidity and body mass index (BMI) of 30.0 to 30.9 in adult 07/15/2022   Urge incontinence 07/12/2022   Atherosclerosis of aorta ([place]) 12/03/2021   Prostate cancer ([place]) 08/08/2021   BPH with obstruction/lower urinary tract symptoms 08/01/2021   Mixed hyperlipidemia 11/30/2019   Diabetic polyneuropathy ([place]) 11/30/2019   Hypertensive heart disease without heart failure 11/30/2019   Chronic left shoulder pain 11/30/2019   Obstructive sleep apnea 04/03/2018   Atherosclerosis of native coronary artery of native heart without angina pectoris 03/28/2018   Old myocardial infarction 03/28/2018    Immunization History  Administered Date(s) Administered   Fluad Quad(high Dose 65+) 06/28/2020, 04/25/2021, 05/21/2022   PFIZER(Purple Top)SARS-COV-2 Vaccination 12/10/2019, 01/08/2020   PNEUMOCOCCAL CONJUGATE-20 05/26/2021   Pneumococcal Polysaccharide-23 06/28/2020    Conditions to be addressed/monitored:  Hypertension, Hyperlipidemia, and Diabetes  Care Plan : [place]  Updates made by Lane Hacker, [place] since 07/25/2022 12:00 AM     Problem: DM, Lipids, Pain, HTN   Priority: High  Onset Date: 08/24/2021     Long-Range Goal: Disease State Management   Start Date: 08/24/2021  Expected End  Date: 08/24/2022  Recent Progress: On track  Priority: High  Note:   Current Barriers:  Does not contact provider office for questions/concerns  Pharmacist Clinical Goal(s):  Patient will contact provider office for questions/concerns as evidenced notation of same in electronic health record through collaboration with PharmD and provider.   Interventions: 1:1 collaboration with Cox, Elnita Maxwell, MD regarding development and update of comprehensive plan of care as evidenced by provider attestation and co-signature Inter-disciplinary care team collaboration (see longitudinal plan of care) Comprehensive medication review performed; medication list updated in electronic medical record  Hypertension (BP goal <140/90) BP Readings from Last 3 Encounters:  07/09/22 110/70  04/05/22 120/64  12/21/21 122/80  -Controlled -Current treatment: Lisinopril 9m QD Appropriate, Effective, Safe, Accessible Metoprolol Succ 263mQD Appropriate, Effective, Safe, Accessible -Medications previously tried: N/A  -Current home readings: Doesn't test -Current dietary habits: "Tries to eat healthy" -Current exercise habits: None -Denies hypotensive/hypertensive symptoms -Educated on BP goals and benefits of medications for prevention of heart attack, stroke and kidney damage; -Counseled to monitor BP at home PRN, document, and provide log at future appointments -Recommended to continue current medication  Hyperlipidemia: (LDL goal < 70) The ASCVD Risk score (Arnett DK, et al., 2019) failed to calculate for the following reasons:   The patient has a prior MI or stroke diagnosis Lab Results  Component Value Date   CHOL 125 07/10/2022   CHOL 209 (H) 04/02/2022   CHOL 123 11/23/2021   Lab Results  Component Value Date   HDL 32 (L) 07/10/2022   HDL 40 04/02/2022   HDL 34 (L) 11/23/2021   Lab Results  Component Value Date   LDLCALC 78 07/10/2022   LDLCALC 146 (H) 04/02/2022   LDLCALC 74 11/23/2021    Lab Results  Component Value Date   TRIG 72 07/10/2022   TRIG 126 04/02/2022   TRIG 73 11/23/2021   Lab Results  Component Value Date   CHOLHDL 3.9 07/10/2022   CHOLHDL 5.2 (H) 04/02/2022   CHOLHDL 3.6 11/23/2021  No results found for: "LDLDIRECT" -Uncontrolled -Current treatment: Ezetimibe 1031mppropriate, Effective, Safe, Accessible Rosuvastatin 62m48mpropriate, Effective, Safe, Accessible -Medications previously tried: Atorvastatin -Current dietary patterns: "Tries to eat healthy" -Current exercise habits: None -Educated on Cholesterol goals;  Jan 2023: Main priority was LibrElenor Legatoay, will talk about statin compliance next week  Diabetes (A1c goal <8%) Lab Results  Component Value Date   HGBA1C 8.2 (H) 07/10/2022   HGBA1C 8.3 (H) 04/02/2022   HGBA1C 7.5 (H) 11/23/2021   Lab Results  Component Value Date   MICROALBUR 150 08/18/2021   LDLCALC 78 07/10/2022   CREATININE 0.91 07/10/2022   Lab Results  Component Value Date   NA 142 07/10/2022   K 4.7 07/10/2022   CREATININE 0.91 07/10/2022   EGFR 91 07/10/2022   GFRNONAA >60 07/26/2021   GLUCOSE 155 (H) 07/10/2022   Lab Results  Component Value Date   WBC 7.8 07/10/2022   HGB 14.0 07/10/2022   HCT 42.3 07/10/2022   MCV 88 07/10/2022   PLT 165 07/10/2022  -Uncontrolled -Current medications: Libre Appropriate, Query effective, Safe, Query accessible Dapagliflozin 10mg20mropriate, Query effective, Safe, Query accessible PAP approved 2023 Ozempic Appropriate, Query effective, Safe, Query accessible 05/29/22: Waiting on approval ASPART SS Appropriate, Query effective, Safe, Query accessible PAP approved per patient in December 2023 Lantus 65 units Appropriate, Query effective, Safe, Accessible PAP approved per patient in December 2023 -Medications previously tried: None  -Current home glucose readings fasting glucose:  Jan 2023: Doesn't test, doesn't have meter/Libre post prandial glucose:  -Denies  hypoglycemic/hyperglycemic symptoms -Current exercise: None -Educated on A1c and blood sugar goals; -Counseled to check  feet daily and get yearly eye exams Jan 2023: Main priority was getting [place]. Spoke with wife, will call  [phone], ASPN, to see status of Elenor Legato. Will f/u next week to hear what next step is. Patient unable to get Trulicity 2.5KN/LZJQ, had to get 3.53m pen from Rx 09/01/21: [facility] [facility] and need a new script. Wife did not call me and has been waiting for script to be sent in. She came to office and picked up LGruetli-[place] [facility] but they haven't started using it yet. Will get DEXCOMM sent ASAP June 2023: PAP for Ozempic and Dapagliflozin sent to patient at beginning of the month. They received it and thought it was the Trulicity PAP that they got last month so never called or finished it. Counseled them on the difference and to call if you have questions. Patient originally didn't answer phone for visit today, had to call his wife. Then patient called back. Counseled they have to bring in ASAP for uK[place]ato send out October 2023: Waiting on final approval. Gave patient number to call today to check on status. Highly encouraged he call December 2023: Patient has meds for Farxiga and both insulins per patient. He is waiting on Ozempic, however that is no longer on his medslist. Will ask PCP if she still wants patient on this  Chronic Pain -Controlled -Current treatment: Gabapentin 3022mQD Query Appropriate, Query effective, Safe, Accessible IBU 80092mID PRN Appropriate, Effective, Safe, Accessible -Medications previously tried: None  -Pain Scale There were no vitals filed for this visit.  Aggravating Factors: Movement  Pain Type: Patient didn't specify  Jan 2023: Main priority was LibElenor Legatoill go over pain at f/u June 2023: Main priority was cost of meds December 2023: Patient content with  therapy   Insomnia (Goal: 7-8 hours of sleep/night) -Not ideally controlled -Patient currently getting unknown hours of sleep/night -Patient currently waking up unknown times/night -Current treatment: N/A -Medications previously tried: Zolpidem -Counseled on sleep hygiene techniques (consistent sleep/wake up schedule, no electronic screens 1-2 hours before bed, etc)   BPH (Goal: improve urination) -Not ideally controlled -Night time urination frequency: unknown/night -Current treatment: Mybetriq 54m52mpropriate, Effective, Safe, Query accessible December 2023: PAP submitted -Medications previously tried: Tamsulosin -Counseled on non-pharmacologic techniques such as Kegels Jan 2023: Main priority was LibrElenor Legatoll go over BPHat f/u June 2023: Unable to conduct IPSS today. Patient didn't answer original phone call and ran out of time December 2023: Did not conduct IPSS because patient doesn't have Mybetriq yet. PAP paperwork submitted. Gave patient number to call for PAP delivery and told him he has to call. He once again told me, "I asked the office to call" and I reminded him again he has to. Will conduct IPSS after med is onboard his system  Patient Goals/Self-Care Activities Patient will:  - take medications as prescribed as evidenced by patient report and record review  Follow Up Plan: The patient has been provided with contact information for the care management team and has been advised to call with any health related questions or concerns.   CPP F/U March 2024   [place] ConstablearSherian Rein- [phone]        Medication Assistance:  -See CP  Compliance/Adherence/Medication fill history: Care Gaps: Last eye exam / Retinopathy Screening? 03-2021  Last Annual Wellness Visit?  08-03-2021  Last Diabetic Foot Exam? 06-19-2021    Star Rating Drugs: Lisinopril 2.5 mg-  Last filled 08-21-2021 90 DS. Previous 05-16-2021 90 DS (Patient's wife stated patient has pills left  that aren't expired. [place] requested a new RX to be sent in. Sent to PCP) Rosuvastatin 40 mg- Last filled 04-05-2022 90 DS [facility]. No previous fills [place]- Patient assistance Ozempic- PAP in process (Patient's wife stated they are working on getting income verification    Patient's preferred pharmacy is:  [place], [place] - [address] [place] [place] [address] Phone: [phone] Fax: [phone]  [facility] - [place], [place] - [address] [place] [address] Phone: [phone] Fax: [phone]  [facility] - [place], [place] - [address] [place] [address] Phone: [phone] Fax: [phone]  Uses pill box? No -   Pt endorses 100% compliance  We discussed: Benefits of medication synchronization, packaging and delivery as well as enhanced pharmacist oversight with [facility]. Patient decided to: Continue current medication management strategy  Care Plan and Follow Up Patient Decision:  Patient agrees to Care Plan and Follow-up.  Plan: The patient has been provided with contact information for the care management team and has been advised to call with any health related questions or concerns.   CPP F/U march 2024  [place] Constable, [place].D. - [phone]

## 2022-07-25 NOTE — Patient Instructions (Signed)
Visit Information   Goals Addressed   None    Patient Care Plan: CCM Pharmacy Care Plan     Problem Identified: DM, Lipids, Pain, HTN   Priority: High  Onset Date: 08/24/2021     Long-Range Goal: Disease State Management   Start Date: 08/24/2021  Expected End Date: 08/24/2022  Recent Progress: On track  Priority: High  Note:   Current Barriers:  Does not contact provider office for questions/concerns  Pharmacist Clinical Goal(s):  Patient will contact provider office for questions/concerns as evidenced notation of same in electronic health record through collaboration with PharmD and provider.   Interventions: 1:1 collaboration with Cox, Elnita Maxwell, MD regarding development and update of comprehensive plan of care as evidenced by provider attestation and co-signature Inter-disciplinary care team collaboration (see longitudinal plan of care) Comprehensive medication review performed; medication list updated in electronic medical record  Hypertension (BP goal <140/90) BP Readings from Last 3 Encounters:  07/09/22 110/70  04/05/22 120/64  12/21/21 122/80  -Controlled -Current treatment: Lisinopril 4m QD Appropriate, Effective, Safe, Accessible Metoprolol Succ 235mQD Appropriate, Effective, Safe, Accessible -Medications previously tried: N/A  -Current home readings: Doesn't test -Current dietary habits: "Tries to eat healthy" -Current exercise habits: None -Denies hypotensive/hypertensive symptoms -Educated on BP goals and benefits of medications for prevention of heart attack, stroke and kidney damage; -Counseled to monitor BP at home PRN, document, and provide log at future appointments -Recommended to continue current medication  Hyperlipidemia: (LDL goal < 70) The ASCVD Risk score (Arnett DK, et al., 2019) failed to calculate for the following reasons:   The patient has a prior MI or stroke diagnosis Lab Results  Component Value Date   CHOL 125 07/10/2022   CHOL 209  (H) 04/02/2022   CHOL 123 11/23/2021   Lab Results  Component Value Date   HDL 32 (L) 07/10/2022   HDL 40 04/02/2022   HDL 34 (L) 11/23/2021   Lab Results  Component Value Date   LDLCALC 78 07/10/2022   LDLCALC 146 (H) 04/02/2022   LDLCALC 74 11/23/2021   Lab Results  Component Value Date   TRIG 72 07/10/2022   TRIG 126 04/02/2022   TRIG 73 11/23/2021   Lab Results  Component Value Date   CHOLHDL 3.9 07/10/2022   CHOLHDL 5.2 (H) 04/02/2022   CHOLHDL 3.6 11/23/2021  No results found for: "LDLDIRECT" -Uncontrolled -Current treatment: Ezetimibe 1056mppropriate, Effective, Safe, Accessible Rosuvastatin 74m23mpropriate, Effective, Safe, Accessible -Medications previously tried: Atorvastatin -Current dietary patterns: "Tries to eat healthy" -Current exercise habits: None -Educated on Cholesterol goals;  Jan 2023: Main priority was LibrElenor Legatoay, will talk about statin compliance next week  Diabetes (A1c goal <8%) Lab Results  Component Value Date   HGBA1C 8.2 (H) 07/10/2022   HGBA1C 8.3 (H) 04/02/2022   HGBA1C 7.5 (H) 11/23/2021   Lab Results  Component Value Date   MICROALBUR 150 08/18/2021   LDLCALC 78 07/10/2022   CREATININE 0.91 07/10/2022   Lab Results  Component Value Date   NA 142 07/10/2022   K 4.7 07/10/2022   CREATININE 0.91 07/10/2022   EGFR 91 07/10/2022   GFRNONAA >60 07/26/2021   GLUCOSE 155 (H) 07/10/2022   Lab Results  Component Value Date   WBC 7.8 07/10/2022   HGB 14.0 07/10/2022   HCT 42.3 07/10/2022   MCV 88 07/10/2022   PLT 165 07/10/2022  -Uncontrolled -Current medications: Libre Appropriate, Query effective, Safe, Query accessible Dapagliflozin 10mg62mropriate, Query effective, Safe, Query accessible PAP  approved 2023 Ozempic Appropriate, Query effective, Safe, Query accessible 05/29/22: Waiting on approval ASPART SS Appropriate, Query effective, Safe, Query accessible PAP approved per patient in December 2023 Lantus 65  units Appropriate, Query effective, Safe, Accessible PAP approved per patient in December 2023 -Medications previously tried: None  -Current home glucose readings fasting glucose:  Jan 2023: Doesn't test, doesn't have meter/Libre post prandial glucose:  -Denies hypoglycemic/hyperglycemic symptoms -Current exercise: None -Educated on A1c and blood sugar goals; -Counseled to check feet daily and get yearly eye exams Jan 2023: Main priority was getting [place]. Spoke with wife, will call  [phone], [facility], to see status of Elenor Legato. Will f/u next week to hear what next step is. Patient unable to get Trulicity 0.9WJ/XBJY, had to get 3.11m pen from Rx 09/01/21: They called [facility] was told they only have Decomm in stock and need a new script. Wife did not call me and has been waiting for script to be sent in. She came to office and [facility] but [facility] haven't [facility] using [facility] yet. Will get DEXCOMM sent ASAP June 2023: PAP for Ozempic and Dapagliflozin sent to patient at beginning of the month. They received it and thought it was the Trulicity PAP that they got last month so never called or finished it. Counseled them on the difference and to call if you have questions. Patient originally didn't answer phone for visit today, had to call his wife. Then patient called back. Counseled they have to bring in ASAP for u[place] send out October 2023: Waiting on final approval. Gave patient number to call today to check on status. Highly encouraged he call December 2023: Patient has meds for Farxiga and both insulins per patient. He is waiting on Ozempic, however that is no longer on his medslist. Will ask PCP if she still wants patient on this  Chronic Pain -Controlled -Current treatment: Gabapentin 3013mQD Query Appropriate, Query effective, Safe, Accessible IBU 80035mID PRN Appropriate, Effective, Safe, Accessible -Medications previously tried: None  -Pain  Scale There were no vitals filed for this visit.  Aggravating Factors: Movement  Pain Type: Patient didn't specify  Jan 2023: Main priority was LibElenor Legatoill go over pain at f/u June 2023: Main priority was cost of meds December 2023: Patient content with therapy   Insomnia (Goal: 7-8 hours of sleep/night) -Not ideally controlled -Patient currently getting unknown hours of sleep/night -Patient currently waking up unknown times/night -Current treatment: N/A -Medications previously tried: Zolpidem -Counseled on sleep hygiene techniques (consistent sleep/wake up schedule, no electronic screens 1-2 hours before bed, etc)   BPH (Goal: improve urination) -Not ideally controlled -Night time urination frequency: unknown/night -Current treatment: Mybetriq 14m54mpropriate, Effective, Safe, Query accessible December 2023: PAP submitted -Medications previously tried: Tamsulosin -Counseled on non-pharmacologic techniques such as Kegels Jan 2023: Main priority was LibrElenor Legatoll go over BPHat f/u June 2023: Unable to conduct IPSS today. Patient didn't answer original phone call and ran out of time December 2023: Did not conduct IPSS because patient doesn't have Mybetriq yet. PAP paperwork submitted. Gave patient number to call for PAP delivery and told him he has to call. He once again told me, "I asked the office to call" and I reminded him again he has to. Will conduct IPSS after med is onboard his system  Patient Goals/Self-Care Activities Patient will:  - take medications as prescribed as evidenced by patient report and record review  Follow Up Plan: The patient has  been provided with contact information for the care management team and has been advised to call with any health related questions or concerns.   CPP F/U March 2024   [place] Con[place], Sherian Rein.D. - [phone]       Mr. Mormile was given information about Chronic Care Management services today including:  CCM service includes  personalized support from designated clinical staff supervised by his physician, including individualized plan of care and coordination with other care providers 24/7 contact phone numbers for assistance for urgent and routine care needs. Standard insurance, coinsurance, copays and deductibles apply for chronic care management only during months in which we provide at least 20 minutes of these services. Most insurances cover these services at 100%, however patients may be responsible for any copay, coinsurance and/or deductible if applicable. This service may help you avoid the need for more expensive face-to-face services. Only one practitioner may furnish and bill the service in a calendar month. The patient may stop CCM services at any time (effective at the end of the month) by phone call to the office staff.  Patient agreed to services and verbal consent obtained.   The patient verbalized understanding of instructions, educational materials, and care plan provided today and DECLINED offer to receive copy of patient instructions, educational materials, and care plan.  The pharmacy team will reach out to the patient again over the next 60 days.   Lane Hacker, [place]

## 2022-07-27 ENCOUNTER — Ambulatory Visit: Payer: Self-pay

## 2022-07-27 DIAGNOSIS — E118 Type 2 diabetes mellitus with unspecified complications: Secondary | ICD-10-CM

## 2022-07-27 DIAGNOSIS — I119 Hypertensive heart disease without heart failure: Secondary | ICD-10-CM

## 2022-07-27 NOTE — Chronic Care Management (AMB) (Signed)
Chronic Care Management   CCM RN Visit Note  07/27/2022 Name: Joshua Nelson MRN: [id number] DOB: 03-Feb-1951  Subjective: Joshua Nelson is a 71 y.o. year old male who is a primary care patient of Cox, Kirsten, MD. The patient was referred to the Chronic Care Management team for assistance with care management needs subsequent to provider initiation of CCM services and plan of care.    Today's Visit:   Engaged with the patients wife, Lamond Glantz who is also DRP  for initial visit.     SDOH Interventions Today    Flowsheet Row Most Recent Value  SDOH Interventions   Food Insecurity Interventions Intervention Not Indicated  Housing Interventions Intervention Not Indicated  Transportation Interventions Intervention Not Indicated  Utilities Interventions Intervention Not Indicated  Alcohol Usage Interventions Intervention Not Indicated (Score <7)  Financial Strain Interventions Other (Comment)  [pap for medications, working with pharm D]  Physical Activity Interventions Intervention Not Indicated  Stress Interventions Intervention Not Indicated         Goals Addressed             This Visit's Progress    CCM Expected Outcome:  Monitor, Self-Manage and Reduce Symptoms of Diabetes       Current Barriers:  Knowledge Deficits related to the need to control DM and keep A1C level down to prevent issues with body systems Care Coordination needs related to financial cost of DM medication, working with pharm D in a patient with DM Chronic Disease Management support and education needs related to effective management of DM Financial Constraints.   Planned Interventions: Provided education to patient about basic DM disease process; Reviewed medications with patient and discussed importance of medication adherence;        Reviewed prescribed diet with patient heart healthy/ADA; Counseled on importance of regular laboratory monitoring as prescribed;        Discussed plans with patient  for ongoing care management follow up and provided patient with direct contact information for care management team;      Provided patient with written educational materials related to hypo and hyperglycemia and importance of correct treatment;       Reviewed scheduled/upcoming provider appointments including: 10-11-2022 at [place] am;         Advised patient, providing education and rationale, to check cbg when you have symptoms of low or high blood sugar and continuously, the patient has a freestyle libre  and record        call provider for findings outside established parameters;       Referral made to pharmacy team for assistance with medication cost and pap forms for assistance with medications for management of DM;       Review of patient status, including review of consultants reports, relevant laboratory and other test results, and medications completed;       Advised patient to discuss changes in DM, questions or concerns with provider;      Screening for signs and symptoms of depression related to chronic disease state;        Assessed social determinant of health barriers;         Symptom Management: Take medications as prescribed   Attend all scheduled provider appointments Call provider office for new concerns or questions  call the Suicide and Crisis Lifeline: 988 call the [place] National Suicide Prevention Lifeline: (270)351-1778 or TTY: (508)359-1489 TTY 619-213-4918) to talk to a trained counselor call 1-800-273-TALK (toll free, 24 hour hotline) if experiencing a  Mental Health or [place]  check feet daily for cuts, sores or redness trim toenails straight across manage portion size wash and dry feet carefully every day wear comfortable, cotton socks wear comfortable, well-fitting shoes  Follow Up Plan: Telephone follow up appointment with care management team member scheduled for: 09-11-2022 at 0900 am       CCM Expected Outcome:  Monitor, Self-Manage, and  Reduce Symptoms of Hypertension       Current Barriers:  Chronic Disease Management support and education needs related to effective management of HTN BP Readings from Last 3 Encounters:  07/09/22 110/70  04/05/22 120/64  12/21/21 122/80     Planned Interventions: Evaluation of current treatment plan related to hypertension self management and patient's adherence to plan as established by provider;   Provided education to patient re: stroke prevention, s/s of heart attack and stroke; Reviewed prescribed diet heart healthy/ADA diet  Reviewed medications with patient and discussed importance of compliance;  Discussed plans with patient for ongoing care management follow up and provided patient with direct contact information for care management team; Advised patient, providing education and rationale, to monitor blood pressure daily and record, calling PCP for findings outside established parameters;  Reviewed scheduled/upcoming provider appointments including: 10-11-2022 at [place] am Advised patient to discuss changes in HTN or heart health with provider; Provided education on prescribed diet heart healthy/ADA diet ;  Discussed complications of poorly controlled blood pressure such as heart disease, stroke, circulatory complications, vision complications, kidney impairment, sexual dysfunction;  Screening for signs and symptoms of depression related to chronic disease state;  Assessed social determinant of health barriers;   Symptom Management: Take medications as prescribed   Attend all scheduled provider appointments Call provider office for new concerns or questions  call the Suicide and Crisis Lifeline: 988 call the [place] National Suicide Prevention Lifeline: (747)082-5239 or TTY: 314-001-4194 TTY 204-152-5510) to talk to a trained counselor call 1-800-273-TALK (toll free, 24 hour hotline) if experiencing a Mental Health or [place]  check blood pressure weekly learn  about high blood pressure call doctor for signs and symptoms of high blood pressure develop an action plan for high blood pressure keep all doctor appointments take medications for blood pressure exactly as prescribed report new symptoms to your doctor  Follow Up Plan: Telephone follow up appointment with care management team member scheduled for: 09-11-2022 at 0900 am          Plan:Telephone follow up appointment with care management team member scheduled for:  09-11-2022 at 0900 am  Noreene Larsson RN, MSN, CCM RN Care Manager  Chronic Care Management Direct Number: [phone]

## 2022-07-27 NOTE — Patient Instructions (Signed)
Please call the care guide team at [phone] if you need to cancel or reschedule your appointment.   If you are experiencing a Mental Health or [place] or need someone to talk to, please call the Suicide and Crisis Lifeline: 988 call the [place] National Suicide Prevention Lifeline: 863-346-5886 or TTY: 973-054-1240 TTY 907-734-9328) to talk to a trained counselor call 1-800-273-TALK (toll free, 24 hour hotline)   Following is a copy of your full provider care plan:   Goals Addressed             This Visit's Progress    CCM Expected Outcome:  Monitor, Self-Manage and Reduce Symptoms of Diabetes       Current Barriers:  Knowledge Deficits related to the need to control DM and keep A1C level down to prevent issues with body systems Care Coordination needs related to financial cost of DM medication, working with pharm D in a patient with DM Chronic Disease Management support and education needs related to effective management of DM Financial Constraints.   Planned Interventions: Provided education to patient about basic DM disease process; Reviewed medications with patient and discussed importance of medication adherence;        Reviewed prescribed diet with patient heart healthy/ADA; Counseled on importance of regular laboratory monitoring as prescribed;        Discussed plans with patient for ongoing care management follow up and provided patient with direct contact information for care management team;      Provided patient with written educational materials related to hypo and hyperglycemia and importance of correct treatment;       Reviewed scheduled/upcoming provider appointments including: 10-11-2022 at [place] am;         Advised patient, providing education and rationale, to check cbg when you have symptoms of low or high blood sugar and continuously, the patient has a freestyle libre  and record        call provider for findings outside established parameters;        Referral made to pharmacy team for assistance with medication cost and pap forms for assistance with medications for management of DM;       Review of patient status, including review of consultants reports, relevant laboratory and other test results, and medications completed;       Advised patient to discuss changes in DM, questions or concerns with provider;      Screening for signs and symptoms of depression related to chronic disease state;        Assessed social determinant of health barriers;         Symptom Management: Take medications as prescribed   Attend all scheduled provider appointments Call provider office for new concerns or questions  call the Suicide and Crisis Lifeline: 988 call the [place] National Suicide Prevention Lifeline: 480-270-3257 or TTY: (403)305-4743 TTY 720-549-4946) to talk to a trained counselor call 1-800-273-TALK (toll free, 24 hour hotline) if experiencing a Mental Health or [place]  check feet daily for cuts, sores or redness trim toenails straight across manage portion size wash and dry feet carefully every day wear comfortable, cotton socks wear comfortable, well-fitting shoes  Follow Up Plan: Telephone follow up appointment with care management team member scheduled for: 09-11-2022 at 0900 am       CCM Expected Outcome:  Monitor, Self-Manage, and Reduce Symptoms of Hypertension       Current Barriers:  Chronic Disease Management support and education needs related to effective management of  HTN BP Readings from Last 3 Encounters:  07/09/22 110/70  04/05/22 120/64  12/21/21 122/80     Planned Interventions: Evaluation of current treatment plan related to hypertension self management and patient's adherence to plan as established by provider;   Provided education to patient re: stroke prevention, s/s of heart attack and stroke; Reviewed prescribed diet heart healthy/ADA diet  Reviewed medications with patient and discussed  importance of compliance;  Discussed plans with patient for ongoing care management follow up and provided patient with direct contact information for care management team; Advised patient, providing education and rationale, to monitor blood pressure daily and record, calling PCP for findings outside established parameters;  Reviewed scheduled/upcoming provider appointments including: 10-11-2022 at [place] am Advised patient to discuss changes in HTN or heart health with provider; Provided education on prescribed diet heart healthy/ADA diet ;  Discussed complications of poorly controlled blood pressure such as heart disease, stroke, circulatory complications, vision complications, kidney impairment, sexual dysfunction;  Screening for signs and symptoms of depression related to chronic disease state;  Assessed social determinant of health barriers;   Symptom Management: Take medications as prescribed   Attend all scheduled provider appointments Call provider office for new concerns or questions  call the Suicide and Crisis Lifeline: 988 call the [place] National Suicide Prevention Lifeline: 779 134 3570 or TTY: 336-677-5266 TTY 978-134-6078) to talk to a trained counselor call 1-800-273-TALK (toll free, 24 hour hotline) if experiencing a Mental Health or [place]  check blood pressure weekly learn about high blood pressure call doctor for signs and symptoms of high blood pressure develop an action plan for high blood pressure keep all doctor appointments take medications for blood pressure exactly as prescribed report new symptoms to your doctor  Follow Up Plan: Telephone follow up appointment with care management team member scheduled for: 09-11-2022 at 0900 am          The patient verbalized understanding of instructions, educational materials, and care plan provided today and DECLINED offer to receive copy of patient instructions, educational materials, and care plan.    Telephone follow up appointment with care management team member scheduled for: 09-11-2021 at 0900 am

## 2022-07-27 NOTE — Plan of Care (Signed)
Chronic Care Management Provider Comprehensive Care Plan    07/27/2022 Name: Joshua Nelson MRN: [id number] DOB: 1951/07/27  Referral to Chronic Care Management (CCM) services was placed by Provider:  Dr. [name] on Date: 07-08-2022.  Chronic Condition 1: DM Provider Assessment and Plan  Expand All Collapse All    Subjective:  Patient ID: Joshua Nelson, male    DOB: 17-Sep-1950  Age: 71 y.o. MRN: [id number]      Chief Complaint  Patient presents with   Diabetes   Hyperlipidemia      HPI Diabetes:  Complications: hyperlipidemia, hypertension, and neuropathy  Glucose checking:  patient has Freestyle Libre 2.  Glucose logs: 120-350 Hypoglycemia: once about a week ago.  Most recent A1C:7.5 Current medications: Farxiga 10 mg take 1 tablet daily, Lantus inject 65 units daily, Fiasp 10 units before breakfast and supper. Patient is unable to get the trulicity.  Last Eye Exam: overdue. 2021. Foot checks: Daily         Expected Outcome/Goals Addressed This Visit (Provider CCM goals/Provider Assessment and plan   Goal: CCM (DM)  EXPECTED OUTCOME:  MONITOR,SELF- MANAGE AND REDUCE SYMPTOMS OF DIABETES  Symptom Management Condition 1: Take all medications as prescribed Attend all scheduled provider appointments Call provider office for new concerns or questions  call the Suicide and Crisis Lifeline: 988 call the [place] National Suicide Prevention Lifeline: 5090389932 or TTY: 716-759-1750 TTY 719-810-5540) to talk to a trained counselor call 1-800-273-TALK (toll free, 24 hour hotline) if experiencing a Mental Health or [place]  check feet daily for cuts, sores or redness trim toenails straight across manage portion size wash and dry feet carefully every day wear comfortable, cotton socks wear comfortable, well-fitting shoes  Chronic Condition 2: HTN Provider Assessment and Plan Current medications: Metoprolol 25 mg take 1 tablet daily, Lisinopril 2.5 mg   mg take 1 tablet daily   Diet: Fairly healthy most of the time.   Exercise: some walking.      Expected Outcome/Goals Addressed This Visit (Provider CCM goals/Provider Assessment and plan   Goal: CCM (HYPERTENSION)  EXPECTED OUTCOME:  MONITOR,SELF- MANAGE AND REDUCE SYMPTOMS OF HYPERTENSION     Symptom Management Condition 2: Take all medications as prescribed Attend all scheduled provider appointments Call provider office for new concerns or questions  call the Suicide and Crisis Lifeline: 988 call the [place] National Suicide Prevention Lifeline: (959) 429-0603 or TTY: 3364478507 TTY (785)698-4971) to talk to a trained counselor call 1-800-273-TALK (toll free, 24 hour hotline) if experiencing a Mental Health or [place]  check blood pressure weekly learn about high blood pressure call doctor for signs and symptoms of high blood pressure keep all doctor appointments take medications for blood pressure exactly as prescribed report new symptoms to your doctor  Problem List Patient Active Problem List   Diagnosis Date Noted   Class 1 obesity due to excess calories with serious comorbidity and body mass index (BMI) of 30.0 to 30.9 in adult 07/15/2022   Urge incontinence 07/12/2022   Atherosclerosis of aorta ([place]) 12/03/2021   Prostate cancer ([place]) 08/08/2021   BPH with obstruction/lower urinary tract symptoms 08/01/2021   Mixed hyperlipidemia 11/30/2019   Diabetic polyneuropathy ([place]) 11/30/2019   Hypertensive heart disease without heart failure 11/30/2019   Chronic left shoulder pain 11/30/2019   Obstructive sleep apnea 04/03/2018   Atherosclerosis of native coronary artery of native heart without angina pectoris 03/28/2018   Old myocardial infarction 03/28/2018    Medication Management  Current Outpatient Medications:    Blood Pressure Monitoring (BLOOD PRESSURE MONITOR/S CUFF) MISC, Use as directed., Disp: 1 each, Rfl: 0   Continuous Blood Gluc Sensor  (FREESTYLE LIBRE 2 SENSOR) MISC, Change sensor every 10 day as directed, Disp: 3 each, Rfl: 5   ezetimibe (ZETIA) 10 MG tablet, TAKE ONE (1) TABLET ONCE DAILY, Disp: 90 tablet, Rfl: 1   FARXIGA 10 MG TABS tablet, TAKE 1 TABLET BY MOUTH ONCE DAILY IN THE MORNING AS DIRECTED., Disp: 90 tablet, Rfl: 1   gabapentin (NEURONTIN) 300 MG capsule, Take 1 capsule (300 mg total) by mouth 3 (three) times daily., Disp: 270 capsule, Rfl: 1   insulin aspart (FIASP FLEXTOUCH) 100 UNIT/ML FlexTouch Pen, 10 U before breakfast and supper., Disp: 18 mL, Rfl: 0   LANTUS SOLOSTAR 100 UNIT/ML Solostar Pen, Inject 65 Units into the skin daily. Pt takes in the pm per wife, Disp: 30 mL, Rfl: 1   lisinopril (ZESTRIL) 5 MG tablet, Once daily in the am, Disp: 90 tablet, Rfl: 0   Meclizine HCl 25 MG CHEW, TAKE 1 TABLET THREE TIMES DAILY AS NEEDED FOR DIZZINESS, Disp: 30 tablet, Rfl: 0   metoprolol succinate (TOPROL-XL) 25 MG 24 hr tablet, TAKE ONE (1) TABLET ONCE DAILY takes in am, Disp: 90 tablet, Rfl: 1   mirabegron ER (MYRBETRIQ) 50 MG TB24 tablet, Take 1 tablet (50 mg total) by mouth daily., Disp: 90 tablet, Rfl: 3   rosuvastatin (CRESTOR) 40 MG tablet, Take 1 tablet (40 mg total) by mouth daily., Disp: 90 tablet, Rfl: 0  Cognitive Assessment Identity Confirmed: : Name; DOB Cognitive Status: Normal Other:  : HIPPA verified on behalf of the patients wife and DRP- [name]   Functional Assessment Hearing Difficulty or Deaf: no Wear Glasses or Blind: no Concentrating, Remembering or Making Decisions Difficulty (CP): no Difficulty Communicating: no Difficulty Eating/Swallowing: no Walking or Climbing Stairs Difficulty: no Dressing/Bathing Difficulty: no Doing Errands Independently Difficulty (such as shopping) (CP): no Change in Functional Status Since Onset of Current Illness/Injury: no   Caregiver Assessment  Primary Source of Support/Comfort: spouse Name of Support/Comfort Primary Source: Camdan Burdi People in  Home: spouse Name(s) of People in Home: [name]- wife Family Caregiver if Needed: none Primary Roles/Responsibilities: wage earner, full-time   Planned Interventions  Provided education to patient about basic DM disease process; Reviewed medications with patient and discussed importance of medication adherence;        Reviewed prescribed diet with patient heart healthy/ADA; Counseled on importance of regular laboratory monitoring as prescribed;        Discussed plans with patient for ongoing care management follow up and provided patient with direct contact information for care management team;      Provided patient with written educational materials related to hypo and hyperglycemia and importance of correct treatment;       Reviewed scheduled/upcoming provider appointments including: 10-11-2022 at [place] am;         Advised patient, providing education and rationale, to check cbg when you have symptoms of low or high blood sugar and continuously, the patient has a freestyle libre  and record        call provider for findings outside established parameters;       Referral made to pharmacy team for assistance with medication cost and pap forms for assistance with medications for management of DM;       Review of patient status, including review of consultants reports, relevant laboratory and other test results, and medications  completed;       Advised patient to discuss changes in DM, questions or concerns with provider;      Screening for signs and symptoms of depression related to chronic disease state;        Assessed social determinant of health barriers;   Evaluation of current treatment plan related to hypertension self management and patient's adherence to plan as established by provider;   Provided education to patient re: stroke prevention, s/s of heart attack and stroke; Reviewed prescribed diet heart healthy/ADA diet  Reviewed medications with patient and discussed importance of  compliance;  Discussed plans with patient for ongoing care management follow up and provided patient with direct contact information for care management team; Advised patient, providing education and rationale, to monitor blood pressure daily and record, calling PCP for findings outside established parameters;  Reviewed scheduled/upcoming provider appointments including: 10-11-2022 at [place] am Advised patient to discuss changes in HTN or heart health with provider; Provided education on prescribed diet heart healthy/ADA diet ;  Discussed complications of poorly controlled blood pressure such as heart disease, stroke, circulatory complications, vision complications, kidney impairment, sexual dysfunction;  Screening for signs and symptoms of depression related to chronic disease state;  Assessed social determinant of health barriers;         Interaction and coordination with outside resources, practitioners, and providers See CCM Referral  Care Plan: Patient declined

## 2022-08-12 DIAGNOSIS — E785 Hyperlipidemia, unspecified: Secondary | ICD-10-CM | POA: Diagnosis not present

## 2022-08-12 DIAGNOSIS — I10 Essential (primary) hypertension: Secondary | ICD-10-CM

## 2022-08-12 DIAGNOSIS — N400 Enlarged prostate without lower urinary tract symptoms: Secondary | ICD-10-CM | POA: Diagnosis not present

## 2022-08-12 DIAGNOSIS — Z794 Long term (current) use of insulin: Secondary | ICD-10-CM | POA: Diagnosis not present

## 2022-08-22 ENCOUNTER — Telehealth: Payer: Self-pay | Admitting: Family Medicine

## 2022-08-22 ENCOUNTER — Telehealth: Payer: Self-pay

## 2022-08-22 NOTE — Telephone Encounter (Signed)
PT PICKED UP PATIENT ASSISTANCE TODAY 01.10.24 FOR HIS LANTUS. SIGNED FORM IN PHARM BOX

## 2022-08-22 NOTE — Telephone Encounter (Signed)
PCP wants patient on Ozempic. Will make sure PAP is still being processed. Sent msg to [employer]

## 2022-08-23 ENCOUNTER — Other Ambulatory Visit: Payer: Self-pay | Admitting: Family Medicine

## 2022-08-23 MED ORDER — FIASP FLEXTOUCH 100 UNIT/ML ~~LOC~~ SOPN: PEN_INJECTOR | SUBCUTANEOUS | 0 refills | Status: DC

## 2022-08-28 ENCOUNTER — Telehealth: Payer: Self-pay

## 2022-08-28 ENCOUNTER — Ambulatory Visit: Payer: HMO | Admitting: Podiatry

## 2022-08-28 DIAGNOSIS — M79674 Pain in right toe(s): Secondary | ICD-10-CM

## 2022-08-28 DIAGNOSIS — B351 Tinea unguium: Secondary | ICD-10-CM | POA: Diagnosis not present

## 2022-08-28 DIAGNOSIS — E1169 Type 2 diabetes mellitus with other specified complication: Secondary | ICD-10-CM

## 2022-08-28 DIAGNOSIS — M2041 Other hammer toe(s) (acquired), right foot: Secondary | ICD-10-CM

## 2022-08-28 DIAGNOSIS — M79675 Pain in left toe(s): Secondary | ICD-10-CM | POA: Diagnosis not present

## 2022-08-28 DIAGNOSIS — E1142 Type 2 diabetes mellitus with diabetic polyneuropathy: Secondary | ICD-10-CM

## 2022-08-28 DIAGNOSIS — M2042 Other hammer toe(s) (acquired), left foot: Secondary | ICD-10-CM

## 2022-08-28 NOTE — Progress Notes (Signed)
Care Management & Coordination Services Pharmacy Team  Reason for Encounter: Diabetes  Contacted patient on 08/28/2022 to discuss diabetes disease state.   Recent office visits:  07-27-2022  Vanita Ingles, RN (CCM)  Recent consult visits:  None  Hospital visits:  None in previous 6 months  Medications: Outpatient Encounter Medications as of 08/28/2022  Medication Sig   Blood Pressure Monitoring (BLOOD PRESSURE MONITOR/S CUFF) MISC Use as directed.   Continuous Blood Gluc Sensor (FREESTYLE LIBRE 2 SENSOR) MISC Change sensor every 10 day as directed   ezetimibe (ZETIA) 10 MG tablet TAKE ONE (1) TABLET ONCE DAILY   FARXIGA 10 MG TABS tablet TAKE 1 TABLET BY MOUTH ONCE DAILY IN THE MORNING AS DIRECTED.   gabapentin (NEURONTIN) 300 MG capsule Take 1 capsule (300 mg total) by mouth 3 (three) times daily.   insulin aspart (FIASP FLEXTOUCH) 100 UNIT/ML FlexTouch Pen 10 U before breakfast and supper.   LANTUS SOLOSTAR 100 UNIT/ML Solostar Pen Inject 65 Units into the skin daily. Pt takes in the pm per wife   lisinopril (ZESTRIL) 5 MG tablet Once daily in the am   Meclizine HCl 25 MG CHEW TAKE 1 TABLET THREE TIMES DAILY AS NEEDED FOR DIZZINESS   metoprolol succinate (TOPROL-XL) 25 MG 24 hr tablet TAKE 1 TABLET BY MOUTH ONCE DAILY IN THE MORNING.   mirabegron ER (MYRBETRIQ) 50 MG TB24 tablet Take 1 tablet (50 mg total) by mouth daily.   rosuvastatin (CRESTOR) 40 MG tablet Take 1 tablet (40 mg total) by mouth daily.   Semaglutide (OZEMPIC, 2 MG/DOSE, Guadalupe Guerra) Inject 2 mg into the skin once a week.   No facility-administered encounter medications on file as of 08/28/2022.    Recent Relevant Labs: Lab Results  Component Value Date/Time   HGBA1C 8.2 (H) 07/10/2022 08:29 AM   HGBA1C 8.3 (H) 04/02/2022 08:37 AM   MICROALBUR 150 08/18/2021 08:47 AM   MICROALBUR 10 10/17/2020 09:50 AM    Kidney Function Lab Results  Component Value Date/Time   CREATININE 0.91 07/10/2022 08:29 AM   CREATININE  0.90 04/02/2022 08:37 AM   GFRNONAA >60 07/26/2021 11:30 AM   GFRAA 105 06/28/2020 09:30 AM    Current antihyperglycemic regimen:  Farxiga 10 mg daily Ozempic 2 mg weekly (Hasn't started) Lantus 65 units daily Fiasp 10 units before breakfast and dinner (Hasn't started)   Patient verbally confirms he is taking the above medications as directed. Yes  What diet changes have been made to improve diabetes control? Patient's wife stated no  What recent interventions/DTPs have been made to improve glycemic control:  Educated on A1c and blood sugar goals;   Have there been any recent hospitalizations or ED visits since last visit with PharmD? No  Patient denies hypoglycemic symptoms  Patient denies hyperglycemic symptoms, including blurry vision, excessive thirst, fatigue, polyuria, and weakness  How often are you checking your blood sugar? 3-4 times daily  What are your blood sugars ranging?  Fasting: 01-16 150, 01-14 165, 01-13 154, 01-12 138, 01-11 117, 01-10 115 Before meals: None After meals: 01-14 275, 01-13 253, 01-12 142, 01-11 290, 01-10 175 Bedtime: None  During the week, how often does your blood glucose drop below 70? Never  Are you checking your feet daily/regularly? Yes  Adherence Review: Is the patient currently on a STATIN medication? Yes Is the patient currently on ACE/ARB medication? Yes Does the patient have >5 day gap between last estimated fill dates? Yes  Star Rating Drugs:  Farxiga 10 mg-  PAP Rosuvastatin 40 mg- Last filled 04-05-2022 90 DS. No previous fill.(Refills needed sent to PCP) Lisinopril 5 mg- Last filled 07-23-2022 90 DS. Previous 08-21-2021 90 DS Ozempic 2 mg- PAP in process  Care Gaps: Annual wellness visit in last year? Yes Last eye exam / retinopathy screening:  03-2021   Last diabetic foot exam: 05-22-2022   [facility]

## 2022-08-28 NOTE — Progress Notes (Signed)
  Subjective:  Patient ID: Joshua Nelson, male    DOB: 04/05/1951,  MRN: [id number]  Chief Complaint  Patient presents with   Nail Problem    Diabetic Foot Care     72 y.o. male presents with the above complaint. History confirmed with patient. Patient presenting with pain related to dystrophic thickened elongated nails. Patient is unable to trim own nails related to nail dystrophy and/or mobility issues. Patient does have a history of T2DM with peripheral neuropathy.  Objective:  Physical Exam: warm, good capillary refill, DP and PT pulses 1/4 bilateral nail exam onychomycosis of the toenails, onycholysis, and dystrophic nails DP pulses palpable, PT pulses palpable, and protective sensation absent Left Foot:  Pain with palpation of nails due to elongation and dystrophic growth.  Right Foot: Pain with palpation of nails due to elongation and dystrophic growth.   Assessment:   1. Pain due to onychomycosis of toenails of both feet   2. DM type 2 with diabetic peripheral neuropathy ([place])   3. Onychomycosis of multiple toenails with type 2 diabetes mellitus and peripheral neuropathy ([place])   4. Hammer toes of both feet       Plan:  Patient was evaluated and treated and all questions answered.  #Onychomycosis with pain  -Nails palliatively debrided as below. -Educated on self-care  Procedure: Nail Debridement Rationale: Pain Type of Debridement: manual, sharp debridement. Instrumentation: Nail nipper, rotary burr. Number of Nails: 10  Return in about 3 months (around 11/27/2022) for [facility].         Everitt Amber, DPM [facility]

## 2022-08-28 NOTE — Addendum Note (Signed)
Addended by: Ames Coupe F on: 08/28/2022 11:40 AM   Modules accepted: Orders

## 2022-08-29 ENCOUNTER — Other Ambulatory Visit: Payer: Self-pay

## 2022-08-29 MED ORDER — ROSUVASTATIN CALCIUM 40 MG PO TABS: 40.0000 mg | ORAL_TABLET | Freq: Every day | ORAL | 0 refills | Status: DC

## 2022-08-30 ENCOUNTER — Telehealth: Payer: Self-pay

## 2022-08-30 NOTE — Progress Notes (Unsigned)
Faxed new application to [employer] for [employer].    Pattricia Boss, [place] Pharmacist Assistant [phone]'

## 2022-08-30 NOTE — Telephone Encounter (Cosign Needed)
[employer] said application wasn't sent. Informed [place] to refax

## 2022-09-11 ENCOUNTER — Telehealth: Payer: HMO

## 2022-09-11 ENCOUNTER — Ambulatory Visit (INDEPENDENT_AMBULATORY_CARE_PROVIDER_SITE_OTHER): Payer: HMO

## 2022-09-11 DIAGNOSIS — Z794 Long term (current) use of insulin: Secondary | ICD-10-CM

## 2022-09-11 DIAGNOSIS — I119 Hypertensive heart disease without heart failure: Secondary | ICD-10-CM

## 2022-09-11 NOTE — Chronic Care Management (AMB) (Signed)
Chronic Care Management   CCM RN Visit Note  09/11/2022 Name: Joshua Nelson MRN: [id number] DOB: 1951-06-20  Subjective: Joshua Nelson is a 72 y.o. year old male who is a primary care patient of Nelson, Kirsten, MD. The patient was referred to the Chronic Care Management team for assistance with care management needs subsequent to provider initiation of CCM services and plan of care.    Today's Visit:   Spoke to the patients wife and DRP, Joshua Nelson  for follow up visit.        Goals Addressed             This Visit's Progress    CCM Expected Outcome:  Monitor, Self-Manage and Reduce Symptoms of Diabetes       Current Barriers:  Knowledge Deficits related to the need to control DM and keep A1C level down to prevent issues with body systems Care Coordination needs related to financial cost of DM medication, working with pharm D in a patient with DM Chronic Disease Management support and education needs related to effective management of DM Financial Constraints.  Lab Results  Component Value Date   HGBA1C 8.2 (H) 07/10/2022     Planned Interventions: Provided education to patient about basic DM disease process; Reviewed medications with patient and discussed importance of medication adherence. The patient still has not received his Joshua Nelson of Joshua Nelson. The patients wife states that she does not know why the Joshua Nelson but they are waiting on approval of the Joshua Nelson. The patients wife states they did get a letter stating he was approved for medications but she does not know if it was the Joshua Nelson or not it did not say. She says the letter said they would receive in mail. The patients wife also states she does not know why on the [place] and ask if a new script could be sent to Joshua Nelson. Sent a message to the pharm D by secure chat for follow up and recommendations. ;        Reviewed prescribed diet with patient heart healthy/ADA. Education and support given. The patients wife states that on  Sunday's his blood sugars are usually more elevated but it is a combination of reasons like decreased activity and eating patterns; Counseled on importance of regular laboratory monitoring as prescribed. Has regular lab work;        Discussed plans with patient for ongoing care management follow up and provided patient with direct contact information for care management team;      Provided patient with written educational materials related to hypo and hyperglycemia and importance of correct treatment. Education and support given. The patients highest is usually on Sundays and she has seen it as high as 300 and as low at 100. When it is under 100 he gets diaphoretic and feels different;       Reviewed scheduled/upcoming provider appointments including: 10-11-2022 at [place] am;         Advised patient, providing education and rationale, to check cbg when you have symptoms of low or high blood sugar and continuously, the patient has a freestyle Joshua Nelson  and record. The patient continue to use the freestyle libre and it has alarms for lows and highs       call provider for findings outside established parameters;       Referral made to pharmacy team for assistance with medication cost and pap forms for assistance with medications for management of DM;  Review of patient status, including review of consultants reports, relevant laboratory and other test results, and medications completed;       Advised patient to discuss changes in DM, questions or concerns with provider;      Screening for signs and symptoms of depression related to chronic disease state;        Assessed social determinant of health barriers;       Last foot exam by provider was 08-28-2022 with podiatry Last eye exam 03-2021. The patients wife states they are working on getting an appointment for eye exam.   Symptom Management: Take medications as prescribed   Attend all scheduled provider appointments Call provider office for new  concerns or questions  call the Suicide and Crisis Lifeline: 988 call the [place] National Suicide Prevention Lifeline: 707-188-3983 or TTY: 725-054-8648 TTY 307-695-5435) to talk to a trained counselor call 1-800-273-TALK (toll free, 24 hour hotline) if experiencing a Mental Health or [place]  check feet daily for cuts, sores or redness trim toenails straight across manage portion size wash and dry feet carefully every day wear comfortable, cotton socks wear comfortable, well-fitting shoes  Follow Up Plan: Telephone follow up appointment with care management team member scheduled for: 10-31-2022 at 0900 am       CCM Expected Outcome:  Monitor, Self-Manage, and Reduce Symptoms of Hypertension       Current Barriers:  Chronic Disease Management support and education needs related to effective management of HTN BP Readings from Last 3 Encounters:  07/09/22 110/70  04/05/22 120/64  12/21/21 122/80     Planned Interventions: Evaluation of current treatment plan related to hypertension self management and patient's adherence to plan as established by provider. The patient has stable blood pressures. The patients wife denies any new issues related to HTN or heart health;   Provided education to patient re: stroke prevention, s/s of heart attack and stroke; Reviewed prescribed diet heart healthy/ADA diet. The patient is compliant with heart healthy/ADA diet. Blood sugars are a little elevated on Sundays due to decreased activity Reviewed medications with patient and discussed importance of compliance. The patient is compliant with medications. Works with pharm D for education and ongoing support;  Discussed plans with patient for ongoing care management follow up and provided patient with direct contact information for care management team; Advised patient, providing education and rationale, to monitor blood pressure daily and record, calling PCP for findings outside established  parameters;  Reviewed scheduled/upcoming provider appointments including: 10-11-2022 at [place] am Advised patient to discuss changes in HTN or heart health with provider; Provided education on prescribed diet heart healthy/ADA diet ;  Discussed complications of poorly controlled blood pressure such as heart disease, stroke, circulatory complications, vision complications, kidney impairment, sexual dysfunction;  Screening for signs and symptoms of depression related to chronic disease state;  Assessed social determinant of health barriers;   Symptom Management: Take medications as prescribed   Attend all scheduled provider appointments Call provider office for new concerns or questions  call the Suicide and Crisis Lifeline: 988 call the [place] National Suicide Prevention Lifeline: (440)770-0998 or TTY: (484) 369-1402 TTY 708-367-9731) to talk to a trained counselor call 1-800-273-TALK (toll free, 24 hour hotline) if experiencing a Mental Health or [place]  check blood pressure weekly learn about high blood pressure call doctor for signs and symptoms of high blood pressure develop an action plan for high blood pressure keep all doctor appointments take medications for blood pressure exactly as prescribed  report new symptoms to your doctor  Follow Up Plan: Telephone follow up appointment with care management team member scheduled for: 10-31-2022 at 0900 am          Plan:Telephone follow up appointment with care management team member scheduled for:  10-31-2022 at 0900 am  Noreene Larsson RN, MSN, CCM RN Care Manager  Chronic Care Management Direct Number: [phone]

## 2022-09-11 NOTE — Patient Instructions (Signed)
Please call the care guide team at [phone] if you need to cancel or reschedule your appointment.   If you are experiencing a Mental Health or [place] or need someone to talk to, please call the Suicide and Crisis Lifeline: 988 call the [place] National Suicide Prevention Lifeline: 425-049-7146 or TTY: (819) 418-6563 TTY 8705695891) to talk to a trained counselor call 1-800-273-TALK (toll free, 24 hour hotline)   Following is a copy of the CCM Program Consent:  CCM service includes personalized support from designated clinical staff supervised by the physician, including individualized plan of care and coordination with other care providers 24/7 contact phone numbers for assistance for urgent and routine care needs. Service will only be billed when office clinical staff spend 20 minutes or more in a month to coordinate care. Only one practitioner may furnish and bill the service in a calendar month. The patient may stop CCM services at amy time (effective at the end of the month) by phone call to the office staff. The patient will be responsible for cost sharing (co-pay) or up to 20% of the service fee (after annual deductible is met)  Following is a copy of your full provider care plan:   Goals Addressed             This Visit's Progress    CCM Expected Outcome:  Monitor, Self-Manage and Reduce Symptoms of Diabetes       Current Barriers:  Knowledge Deficits related to the need to control DM and keep A1C level down to prevent issues with body systems Care Coordination needs related to financial cost of DM medication, working with pharm D in a patient with DM Chronic Disease Management support and education needs related to effective management of DM Financial Constraints.  Lab Results  Component Value Date   HGBA1C 8.2 (H) 07/10/2022     Planned Interventions: Provided education to patient about basic DM disease process; Reviewed medications with patient and  discussed importance of medication adherence. The patient still has not received his Ozempic of Fiasp. The patients wife states that she does not know why the Claiborne Billings but they are waiting on approval of the [place]. The patients wife states they did get a letter stating he was approved for medications but she does not know if it was the Ozempic or not it did not say. She says the letter said they would receive in mail. The patients wife also states she does not know why on the [place] and ask if a new script could be sent to [facility]. Sent a message to the pharm D by secure chat for follow up and recommendations. ;        Reviewed prescribed diet with patient heart healthy/ADA. Education and support given. The patients wife states that on Sunday's his blood sugars are usually more elevated but it is a combination of reasons like decreased activity and eating patterns; Counseled on importance of regular laboratory monitoring as prescribed. Has regular lab work;        Discussed plans with patient for ongoing care management follow up and provided patient with direct contact information for care management team;      Provided patient with written educational materials related to hypo and hyperglycemia and importance of correct treatment. Education and support given. The patients highest is usually on Sundays and she has seen it as high as 300 and as low at 100. When it is under 100 he gets diaphoretic and feels different;  Reviewed scheduled/upcoming provider appointments including: 10-11-2022 at [place] am;         Advised patient, providing education and rationale, to check cbg when you have symptoms of low or high blood sugar and continuously, the patient has a freestyle [place]  and record. The patient continue to use the freestyle libre and it has alarms for lows and highs       call provider for findings outside established parameters;       Referral made to pharmacy team for assistance with medication cost  and pap forms for assistance with medications for management of DM;       Review of patient status, including review of consultants reports, relevant laboratory and other test results, and medications completed;       Advised patient to discuss changes in DM, questions or concerns with provider;      Screening for signs and symptoms of depression related to chronic disease state;        Assessed social determinant of health barriers;       Last foot exam by provider was 08-28-2022 with podiatry Last eye exam 03-2021. The patients wife states they are working on getting an appointment for eye exam.   Symptom Management: Take medications as prescribed   Attend all scheduled provider appointments Call provider office for new concerns or questions  call the Suicide and Crisis Lifeline: 988 call the [place] National Suicide Prevention Lifeline: (541)012-7992 or TTY: 401-460-4180 TTY (252) 416-7927) to talk to a trained counselor call 1-800-273-TALK (toll free, 24 hour hotline) if experiencing a Mental Health or [place]  check feet daily for cuts, sores or redness trim toenails straight across manage portion size wash and dry feet carefully every day wear comfortable, cotton socks wear comfortable, well-fitting shoes  Follow Up Plan: Telephone follow up appointment with care management team member scheduled for: 10-31-2022 at 0900 am       CCM Expected Outcome:  Monitor, Self-Manage, and Reduce Symptoms of Hypertension       Current Barriers:  Chronic Disease Management support and education needs related to effective management of HTN BP Readings from Last 3 Encounters:  07/09/22 110/70  04/05/22 120/64  12/21/21 122/80     Planned Interventions: Evaluation of current treatment plan related to hypertension self management and patient's adherence to plan as established by provider. The patient has stable blood pressures. The patients wife denies any new issues related to HTN  or heart health;   Provided education to patient re: stroke prevention, s/s of heart attack and stroke; Reviewed prescribed diet heart healthy/ADA diet. The patient is compliant with heart healthy/ADA diet. Blood sugars are a little elevated on Sundays due to decreased activity Reviewed medications with patient and discussed importance of compliance. The patient is compliant with medications. Works with pharm D for education and ongoing support;  Discussed plans with patient for ongoing care management follow up and provided patient with direct contact information for care management team; Advised patient, providing education and rationale, to monitor blood pressure daily and record, calling PCP for findings outside established parameters;  Reviewed scheduled/upcoming provider appointments including: 10-11-2022 at [place] am Advised patient to discuss changes in HTN or heart health with provider; Provided education on prescribed diet heart healthy/ADA diet ;  Discussed complications of poorly controlled blood pressure such as heart disease, stroke, circulatory complications, vision complications, kidney impairment, sexual dysfunction;  Screening for signs and symptoms of depression related to chronic disease state;  Assessed social determinant  of health barriers;   Symptom Management: Take medications as prescribed   Attend all scheduled provider appointments Call provider office for new concerns or questions  call the Suicide and Crisis Lifeline: 988 call the [place] National Suicide Prevention Lifeline: (304)298-5756 or TTY: 6400752055 TTY (508)139-9833) to talk to a trained counselor call 1-800-273-TALK (toll free, 24 hour hotline) if experiencing a Mental Health or [place]  check blood pressure weekly learn about high blood pressure call doctor for signs and symptoms of high blood pressure develop an action plan for high blood pressure keep all doctor appointments take  medications for blood pressure exactly as prescribed report new symptoms to your doctor  Follow Up Plan: Telephone follow up appointment with care management team member scheduled for: 10-31-2022 at 0900 am          The patient verbalized understanding of instructions, educational materials, and care plan provided today and DECLINED offer to receive copy of patient instructions, educational materials, and care plan.   Telephone follow up appointment with care management team member scheduled for: 10-31-2022 at 0900 am

## 2022-09-12 ENCOUNTER — Telehealth: Payer: Self-pay

## 2022-09-12 DIAGNOSIS — Z794 Long term (current) use of insulin: Secondary | ICD-10-CM

## 2022-09-12 DIAGNOSIS — I10 Essential (primary) hypertension: Secondary | ICD-10-CM

## 2022-09-12 DIAGNOSIS — E1159 Type 2 diabetes mellitus with other circulatory complications: Secondary | ICD-10-CM | POA: Diagnosis not present

## 2022-09-12 NOTE — Telephone Encounter (Signed)
Per Felicity Coyer: Patient approved to receive Claiborne Billings, Liz Claiborne fine needles and Ozempic through [employer] patient assistance program. Enrollment ends 08/10/2023.

## 2022-09-13 ENCOUNTER — Telehealth: Payer: Self-pay

## 2022-09-13 NOTE — Telephone Encounter (Signed)
Wife informed patient assistance for novo nordisk (OF#[id number]) both fiasp and ozempic are here and ready for pickup.

## 2022-09-13 NOTE — Progress Notes (Signed)
   09/13/2022 Name: Joshua Nelson MRN: [id number] DOB: 1950/09/29  Chief Complaint  Patient presents with   Medication Assistance    RANDEL HARGENS is a 72 y.o. year old male who presented for a telephone visit.   They were referred to the pharmacist by their PCP for assistance in managing medication access.   Patient is participating in a Managed Medicaid Plan:  No  Subjective: Referral to pharmacy to assist patient in getting patient assistance for Fiasp and [place] Team: Primary Care Provider: [name], MD ; Next Scheduled Visit: 09/2922  Medication Access/Adherence  Current Pharmacy:  [facility]  Patient reports affordability concerns with their medications: Yes  Patient reports access/transportation concerns to their pharmacy: No  Patient reports adherence concerns with their medications:  Yes  Currently without meal time insulin coverage   Diabetes: Current medications: Farxiga '10mg'$  daily, Lantus 65 units daily, Fiasp 10 units BID, Ozempic '2mg'$  weekly Medications tried in the past:  Current glucose readings: FBG 100, afternoon readings can be around 300 per wife UsingFreestyle Libre 2 for CGM  Medication Management: Patient reports Fair adherence to medications Patient reports the following barriers to adherence: Copay too high/product not covered on insurance and awaiting patient assistance.  Currently out of meal-time insulin.  Objective: Lab Results  Component Value Date   HGBA1C 8.2 (H) 07/10/2022   Lab Results  Component Value Date   CREATININE 0.91 07/10/2022   BUN 17 07/10/2022   NA 142 07/10/2022   K 4.7 07/10/2022   CL 103 07/10/2022   CO2 23 07/10/2022   Lab Results  Component Value Date   CHOL 125 07/10/2022   HDL 32 (L) 07/10/2022   LDLCALC 78 07/10/2022   TRIG 72 07/10/2022   CHOLHDL 3.9 07/10/2022   Assessment/Plan:  Diabetes/Medication Management: - Currently uncontrolled - Verified application has been processed and  approved for assistance with Fiasp and Ozempic.  Patient should receive Fiasp in the next couple of weeks, but Ozempic has been taking 4-6 weeks. - Recommend to send humalog prescription to [facility], so patient has meal-time coverage until [place] arrives.  Order pending for Dr. Tobie Poet to sign. - Recommend to continue monitoring blood glucose via Libre 2  Follow Up Plan: I will follow-up in 1 week to check on BG readings and status of Fiasp/Ozempic.  Darlina Guys, PharmD, DPLA

## 2022-09-14 NOTE — Telephone Encounter (Signed)
Patient has picked up his [place] and [place].

## 2022-09-15 ENCOUNTER — Other Ambulatory Visit: Payer: Self-pay | Admitting: Family Medicine

## 2022-09-15 MED ORDER — INSULIN LISPRO (1 UNIT DIAL) 100 UNIT/ML (KWIKPEN): PEN_INJECTOR | SUBCUTANEOUS | 11 refills | Status: DC

## 2022-09-19 ENCOUNTER — Telehealth: Payer: Self-pay

## 2022-09-19 NOTE — Telephone Encounter (Signed)
Left message informing patient patient assistance for fiasp is here for pick up [phone]

## 2022-09-20 ENCOUNTER — Other Ambulatory Visit: Payer: HMO

## 2022-09-20 NOTE — Progress Notes (Signed)
   09/20/2022  Patient ID: Joshua Nelson, male   DOB: 1950/09/06, 72 y.o.   MRN: [id number]  Follow up with patient to verify patient assistance medications, Fiasp and Ozempic, were received; and they have been.  Joshua Nelson states blood glucose has been "a little better," but could not provide any values.  Patient has upcoming appointment with Dr. Tobie Nelson 10/11/22 for fasting labs and follow-up.  I will check in with them after this to see where FBG and post-prandial values are since medications were just now re-started.  Joshua Nelson, PharmD, DPLA

## 2022-10-01 ENCOUNTER — Telehealth: Payer: Self-pay

## 2022-10-01 NOTE — Telephone Encounter (Signed)
Per [place] patient assistance program notification:   120- day supply of Tyler Aas was filled on or after 09/11/2022 and should arrive to the office in 10-14 business days. Patient has 1  refill remaining and enrollment will expire on 08/10/2023.

## 2022-10-10 NOTE — Assessment & Plan Note (Signed)
>>  ASSESSMENT AND PLAN FOR DIABETIC POLYNEUROPATHY (HCC) WRITTEN ON 10/10/2022  8:45 PM BY LEAL-BORJAS, Juanette Urizar I, CMA  Control: poor Recommend check sugars fasting daily. Recommend check feet daily. Recommend annual eye exams. Medicines: Farxiga 10 mg take 1 tablet daily, Lantus inject 65 units daily, Increase fiasp insulin to 16 U before breakfast and supper, Ozempic 2 mg weekly. Continue to work on eating a healthy diet and exercise.  Labs drawn today.

## 2022-10-10 NOTE — Assessment & Plan Note (Signed)
Continue crestor 40 mg daily, Zetia 10 mg daily

## 2022-10-10 NOTE — Assessment & Plan Note (Signed)
The current medical regimen is effective;  continue present plan and medications.  Myrbetriq 50 mg daily

## 2022-10-10 NOTE — Progress Notes (Addendum)
Subjective:  Patient ID: Joshua Nelson, male    DOB: 03/02/1951  Age: 72 y.o. MRN: [id number]  Chief Complaint  Patient presents with   Diabetes   Coronary Artery Disease    HPI Diabetes:  Complications: hyperlipidemia, hypertension, and neuropathy  Glucose checking:  patient has Freestyle Libre 2.  Glucose logs: 60-170 Hypoglycemia: once about a week ago.  Most recent A1C: 8.2 Current medications: Farxiga 10 mg take 1 tablet daily, Lantus inject 65 units daily (he has not been taking for 2 weeks regularly because of low blood sugars).   Patient is taking ozempic 2 mg weekly Last Eye Exam: overdue. 2021. Foot checks: Daily  Hyperlipidemia: Current medications:  Crestor 40 mg  take 1 tablet daily, Zetia 10 mg take 1 tablet daily.   Hypertensive with heart disease:: Current medications: Metoprolol 25 mg take 1 tablet daily, Lisinopril 5 mg  mg take 1 tablet daily  Diet: Fairly healthy most of the time.   Exercise: some walking.      07/27/2022   11:21 AM 07/09/2022    2:28 PM 04/05/2022    1:36 PM 12/21/2021   11:49 AM 08/18/2021    8:08 AM  Depression screen PHQ 2/9  Decreased Interest 0 0 0 0 0  Down, Depressed, Hopeless 0 0 0 0 0  PHQ - 2 Score 0 0 0 0 0         08/18/2021    8:08 AM 12/21/2021   12:13 PM 04/05/2022    1:36 PM 07/09/2022    2:27 PM 09/11/2022    9:21 AM  [place] in the past year? 0 1 0 1 0  Was there an injury with Fall? 0 0 0 0 0  Fall Risk Category Calculator 0 2 0 1 0  Fall Risk Category (Retired) Low Moderate Low Low   (RETIRED) Patient Fall Risk Level  Moderate fall risk Low fall risk Low fall risk   Patient at Risk for Falls Due to  History of fall(s) No Fall Risks No Fall Risks No Fall Risks  Fall risk Follow up Falls evaluation completed Falls evaluation completed;Education provided;Falls prevention discussed Falls evaluation completed Falls evaluation completed Falls evaluation completed      Review of Systems   Constitutional:  Negative for chills and fever.  HENT:  Negative for congestion, rhinorrhea and sore throat.   Respiratory:  Negative for cough and shortness of breath.   Cardiovascular:  Negative for chest pain and palpitations.  Gastrointestinal:  Negative for abdominal pain, constipation, diarrhea, nausea and vomiting.  Genitourinary:  Negative for dysuria and urgency.       Urge incontinence   Musculoskeletal:  Negative for arthralgias, back pain and myalgias.  Neurological:  Positive for numbness (hands and feet). Negative for dizziness and headaches.  Psychiatric/Behavioral:  Negative for dysphoric mood. The patient is not nervous/anxious.     Current Outpatient Medications on File Prior to Visit  Medication Sig Dispense Refill   Continuous Blood Gluc Sensor (FREESTYLE LIBRE 2 SENSOR) MISC Change sensor every 10 day as directed 3 each 5   ezetimibe (ZETIA) 10 MG tablet TAKE ONE (1) TABLET ONCE DAILY 90 tablet 1   FARXIGA 10 MG TABS tablet TAKE 1 TABLET BY MOUTH ONCE DAILY IN THE MORNING AS DIRECTED. 90 tablet 1   gabapentin (NEURONTIN) 300 MG capsule Take 1 capsule (300 mg total) by mouth 3 (three) times daily. 270 capsule 1   lisinopril (ZESTRIL) 5 MG tablet Once  daily in the am 90 tablet 0   Meclizine HCl 25 MG CHEW TAKE 1 TABLET THREE TIMES DAILY AS NEEDED FOR DIZZINESS 30 tablet 0   metoprolol succinate (TOPROL-XL) 25 MG 24 hr tablet TAKE 1 TABLET BY MOUTH ONCE DAILY IN THE MORNING. 90 tablet 1   rosuvastatin (CRESTOR) 40 MG tablet Take 1 tablet (40 mg total) by mouth daily. 90 tablet 0   Semaglutide (OZEMPIC, 2 MG/DOSE, [place]) Inject 2 mg into the skin once a week.     insulin lispro (HUMALOG KWIKPEN) 100 UNIT/ML KwikPen Before meals (Patient not taking: Reported on 10/11/2022) 15 mL 11   LANTUS SOLOSTAR 100 UNIT/ML Solostar Pen Inject 65 Units into the skin daily. Pt takes in the pm per wife (Patient not taking: Reported on 10/11/2022) 30 mL 1   mirabegron ER (MYRBETRIQ) 50 MG TB24  tablet Take 1 tablet (50 mg total) by mouth daily. (Patient not taking: Reported on 10/11/2022) 90 tablet 3   No current facility-administered medications on file prior to visit.   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   PONV (postoperative nausea and vomiting)    Prostate cancer ([place]) 07/2021   Past Surgical History:  Procedure Laterality Date   CYSTOSCOPY WITH URETHRAL DILATATION N/A 08/01/2021   Procedure: CYSTOSCOPY WITH BALLOON URETHRAL DILATATION;  Surgeon: Robley Fries, MD;  Location: [facility];  Service: Urology;  Laterality: N/A;   heart stents     x 3   left cataract  10/2019   nose surgery for dog bite as a kid      right cataract Right 08/2019   TRANSURETHRAL RESECTION OF BLADDER TUMOR N/A 08/01/2021   Procedure: TRANSURETHRAL RESECTION OF PROSTATE;  Surgeon: Robley Fries, MD;  Location: [facility];  Service: Urology;  Laterality: N/A;  74 MINS    Family History  Problem Relation Age of Onset   Endometrial cancer Mother    CAD Mother    AAA (abdominal aortic aneurysm) Mother    Healthy Father    Heart attack Brother    Social History   Socioeconomic History   Marital status: Married    Spouse name: [name]   Number of children: 1   Years of education: 12   Highest education level: High school graduate  Occupational History   Occupation: Retired  Tobacco Use   Smoking status: Former    Types: Cigarettes    Quit date: 2003    Years since quitting: 21.1   Smokeless tobacco: Never  Vaping Use   Vaping Use: Never used  Substance and Sexual Activity   Alcohol use: Not Currently    Comment: none since 2000   Drug use: Never   Sexual activity: Not on file   Other Topics Concern   Not on file  Social History Narrative   Lives at home with his wife.   2 cups caffeine per day.   Right-handed.   Social Determinants of Health   Financial Resource Strain: High Risk (07/27/2022)   Overall Financial Resource Strain (CARDIA)    Difficulty of Paying Living Expenses: Very hard  Food Insecurity: No Food Insecurity (07/27/2022)   Hunger Vital Sign  Worried About Charity fundraiser in the Last Year: Never true    [place] in the Last Year: Never true  Transportation Needs: No Transportation Needs (07/27/2022)   PRAPARE - Hydrologist (Medical): No    Lack of Transportation (Non-Medical): No  Physical Activity: Sufficiently Active (07/27/2022)   Exercise Vital Sign    Days of Exercise per Week: 5 days    Minutes of Exercise per Session: 30 min  Stress: No Stress Concern Present (07/27/2022)   [place]    Feeling of Stress : Not at all  Social Connections: Moderately Integrated (07/27/2022)   Social Connection and Isolation Panel [NHANES]    Frequency of Communication with Friends and Family: More than three times a week    Frequency of Social Gatherings with Friends and Family: More than three times a week    Attends Religious Services: More than 4 times per year    Active Member of [employer] or Organizations: No    Attends Music therapist: Never    Marital Status: Married    Objective:  BP 128/72   Pulse 80   Temp (!) 97.2 F (36.2 C)   Resp 16   Ht '5\' 9"'$  (1.753 m)   Wt 201 lb (91.2 kg)   BMI 29.68 kg/m      10/11/2022    7:49 AM 07/09/2022    2:21 PM 04/05/2022    1:31 PM  BP/Weight  Systolic BP 0000000 [id number]  Diastolic BP 72 70 64  Wt. (Lbs) 201 206.8 207  BMI 29.68 kg/m2 30.54 kg/m2 30.57 kg/m2    Physical Exam Constitutional:      Appearance: Normal appearance.  HENT:     Nose: Nose normal. No congestion  or rhinorrhea.     Mouth/Throat:     Mouth: Mucous membranes are moist.     Pharynx: No oropharyngeal exudate or posterior oropharyngeal erythema.  Cardiovascular:     Rate and Rhythm: Normal rate and regular rhythm.     Heart sounds: Normal heart sounds.  Pulmonary:     Effort: Pulmonary effort is normal. No respiratory distress.     Breath sounds: Normal breath sounds. No wheezing, rhonchi or rales.  Abdominal:     General: Bowel sounds are normal.     Palpations: Abdomen is soft.     Tenderness: There is no abdominal tenderness.  Lymphadenopathy:     Cervical: No cervical adenopathy.  Skin:    Capillary Refill: Normal capillary refill Neurological:     Mental Status: He is alert.  Psychiatric:        Mood and Affect: Mood normal.        Behavior: Behavior normal.     Diabetic Foot Exam - Simple   Simple Foot Form Diabetic Foot exam was performed with the following findings: Yes 10/11/2022 10:55 PM  Visual Inspection No deformities, no ulcerations, no other skin breakdown bilaterally: Yes Sensation Testing See comments: Yes Pulse Check Posterior Tibialis and Dorsalis pulse intact bilaterally: Yes Comments      Lab Results  Component Value Date   WBC 7.8 07/10/2022   HGB 14.0 07/10/2022   HCT 42.3 07/10/2022   PLT 165 07/10/2022   GLUCOSE 155 (H) 07/10/2022   CHOL 125 07/10/2022   TRIG 72 07/10/2022   HDL 32 (L) 07/10/2022   LDLCALC 78 07/10/2022   ALT 16 07/10/2022   AST 15  07/10/2022   NA 142 07/10/2022   K 4.7 07/10/2022   CL 103 07/10/2022   CREATININE 0.91 07/10/2022   BUN 17 07/10/2022   CO2 23 07/10/2022   TSH 0.897 07/10/2022   HGBA1C 8.2 (H) 07/10/2022   MICROALBUR 150 08/18/2021      Assessment & Plan:    Hypertensive heart disease without heart failure Assessment & Plan: Well controlled.  No changes to medicines.  Metoprolol 25 mg take 1 tablet daily, Lisinopril 5 mg  mg take 1 tablet daily Continue to work on eating a healthy diet and  exercise.  Labs drawn today.    Orders: -     Comprehensive metabolic panel -     CBC with Differential/Platelet  Atherosclerosis of native coronary artery of native heart without angina pectoris Assessment & Plan: Continue crestor 40 mg daily, Zetia 10 mg daily   Diabetic polyneuropathy associated with type 2 diabetes mellitus ([place]) Assessment & Plan: Control: poor Recommend check sugars fasting daily. Recommend check feet daily. Recommend annual eye exams. Medicines: Farxiga 10 mg take 1 tablet daily, Lantus inject 65 units daily, Increase fiasp insulin to 16 U before breakfast and supper, Ozempic 2 mg weekly. Continue to work on eating a healthy diet and exercise.  Labs drawn today.     Orders: -     Microalbumin / creatinine urine ratio -     Hemoglobin A1c -     Ambulatory referral to Optometry  Mixed hyperlipidemia Assessment & Plan: Well controlled.  No changes to medicines. Crestor 40 mg  take 1 tablet daily, Zetia 10 mg take 1 tablet daily.  Continue to work on eating a healthy diet and exercise.  Labs drawn today.    Orders: -     Lipid panel  Urge incontinence Assessment & Plan: The current medical regimen is effective;  continue present plan and medications.  Myrbetriq 50 mg daily   Colon cancer screening -     Cologuard  Paresthesia -     B12 and Folate Panel -     Methylmalonic acid, serum     No orders of the defined types were placed in this encounter.   Orders Placed This Encounter  Procedures   Comprehensive metabolic panel   Lipid panel   CBC with Differential/Platelet   Microalbumin / creatinine urine ratio   Hemoglobin A1c   Cologuard   B12 and Folate Panel   Methylmalonic acid, serum   Ambulatory referral to Optometry     Follow-up: No follow-ups on file.   I,Marla I Leal-Borjas,acting as a scribe for [name], MD.,have documented all relevant documentation on the behalf of [name], MD,as directed by  [name],  MD while in the presence of [name], MD.   An After Visit Summary was printed and given to the patient.  I attest that I have reviewed this visit and agree with the plan scribed by my staff.   [name], MD [facility] [phone]

## 2022-10-10 NOTE — Assessment & Plan Note (Signed)
Control: poor Recommend check sugars fasting daily. Recommend check feet daily. Recommend annual eye exams. Medicines: Farxiga 10 mg take 1 tablet daily, Lantus inject 65 units daily, Increase fiasp insulin to 16 U before breakfast and supper, Ozempic 2 mg weekly. Continue to work on eating a healthy diet and exercise.  Labs drawn today.

## 2022-10-10 NOTE — Assessment & Plan Note (Signed)
Well controlled.  No changes to medicines.  Metoprolol 25 mg take 1 tablet daily, Lisinopril 5 mg  mg take 1 tablet daily Continue to work on eating a healthy diet and exercise.  Labs drawn today.

## 2022-10-10 NOTE — Assessment & Plan Note (Signed)
Well controlled.  No changes to medicines. Crestor 40 mg  take 1 tablet daily, Zetia 10 mg take 1 tablet daily.  Continue to work on eating a healthy diet and exercise.  Labs drawn today.

## 2022-10-11 ENCOUNTER — Encounter: Payer: Self-pay | Admitting: Family Medicine

## 2022-10-11 ENCOUNTER — Telehealth: Payer: Self-pay

## 2022-10-11 ENCOUNTER — Ambulatory Visit (INDEPENDENT_AMBULATORY_CARE_PROVIDER_SITE_OTHER): Payer: PPO | Admitting: Family Medicine

## 2022-10-11 VITALS — BP 128/72 | HR 80 | Temp 97.2°F | Resp 16 | Ht 69.0 in | Wt 201.0 lb

## 2022-10-11 DIAGNOSIS — I119 Hypertensive heart disease without heart failure: Secondary | ICD-10-CM

## 2022-10-11 DIAGNOSIS — N3941 Urge incontinence: Secondary | ICD-10-CM | POA: Diagnosis not present

## 2022-10-11 DIAGNOSIS — R202 Paresthesia of skin: Secondary | ICD-10-CM

## 2022-10-11 DIAGNOSIS — E782 Mixed hyperlipidemia: Secondary | ICD-10-CM | POA: Diagnosis not present

## 2022-10-11 DIAGNOSIS — E1142 Type 2 diabetes mellitus with diabetic polyneuropathy: Secondary | ICD-10-CM | POA: Diagnosis not present

## 2022-10-11 DIAGNOSIS — I2510 Atherosclerotic heart disease of native coronary artery without angina pectoris: Secondary | ICD-10-CM

## 2022-10-11 DIAGNOSIS — Z1211 Encounter for screening for malignant neoplasm of colon: Secondary | ICD-10-CM

## 2022-10-11 NOTE — Progress Notes (Unsigned)
Patient informed PCP at office visit today that they are in need of their Ozempic from [employer]. [employer] to check status of Ozempic, as I was on hold with [employer], I saw where patient picked up medications were picked up 09/20/2022 from packaging slip for Fiasp and Ozempic. Spoke with representative Ronny Bacon, she explained to me that both medications were already shipped and arrived to the office and signed by [place]. I confirmed this was correct and with further review into patients medications, representative noticed that the Fiasp was sent in the wrong form, cartridges were sent instead of the pens. She placed me on hold for a while to try and get this cleared up to get a new shipment sent of the correct Fiasp pens, when she returned to the phone she asked if she could return my call needing to fix some issues with processing but as far as the [place] patient should have enough, he should have received 4 boxes. Awaiting representative return call. Also found 3 boxes of Fiasp in office refrigerator but no Ozempic.  Called patient to check how many boxes of Ozempic they picked up and how many he has left. Spoke with wife, informed about the [place] mistake from [employer] and that they are working on getting correct form sent to the office. Inquired about Ozempic and she stated he used his last pen on Tuesday and will need a dose for next week, she also mentioned that Dr Tobie Poet might change his Ozempic dose depending on labs. Informed wife that I would call her back with more information once I receive further information from [employer].  10/12/2022- Received a call back from [employer], spoke with Ronny Bacon again she was able to get a 30 day free  Voucher for [employer], pharmacy information below: BIN: [id number] Group: [id number] PCN: [id number] ID: [id number] Pharmacy [phone]  Ronny Bacon explained to me also that Ozempic's next shipment is not until 12/02/2022. Placed Natasha  on hold while I called patient and wife back to get clarification on how he is taking the Ozempic. Spoke with wife, she explained to me that she was injecting the entire pen to patient once a week, explained to wife that only '2mg'$  was to be given once a week and 1 pen would last one month but we will get her proper training on how to administer this medication. I will call providers office to discuss all finding and instructed wife that if she could go by the office next week for training with a nurse and I will check for sample of if Dr Tobie Poet is going to change dosage, I left an reorder/change request form with Dr Tobie Poet on Thursday before I left. Wife aware I will call her back. Returned to representative and thanked her for her help.  Called PCP office spoke with Lauren and Dr Tobie Poet, explained to them the situations at hand, with wrong dosage given of Ozempic and wrong form of Fiasp being shipped to the office. Per Dr. Tobie Poet patient should continue with  Lantus 35 units daily Fiasp use to adjust if needed at meal time. Sample pen- Ozempic Keep blood sugar log   Spoke with wife regarding providers instructions. She is aware and understand all instructions from PCP and will go by their office next week for sample and ozempic training, wife states patient has enough Fiasp on hand and if voucher is needed, she will let me know and we can request a prescription to their local  pharmacy.   Updated Arizona Constable, Pharm D of changes and issues, we will get patient in to have a telehealth visit soon.   Pattricia Boss, [place] Pharmacist Assistant [phone]

## 2022-10-11 NOTE — Patient Instructions (Addendum)
Increase gabapentin to 600 mg three times a day.   Continue to hold insulin.   Recommend get tetanus, shingrix vaccine series and RSV vaccine at your pharmacy

## 2022-10-12 ENCOUNTER — Encounter: Payer: Self-pay | Admitting: Family Medicine

## 2022-10-15 ENCOUNTER — Telehealth: Payer: Self-pay

## 2022-10-15 NOTE — Progress Notes (Signed)
   10/15/2022  Patient ID: Joshua Nelson, male   DOB: Jul 11, 1951, 72 y.o.   MRN: [id number]  Patient outreach attempt to follow-up with patient after recent PCP visit.  It appears all PAP medications may have been received except Tresiba, which should arrive in the next week or so.  Check on current medication regimen and home BG values.  Left message for patient to give me a call back, but I will attempt to reach again if I have not heard back by Thursday.  Darlina Guys, PharmD, DPLA

## 2022-10-16 LAB — CBC WITH DIFFERENTIAL/PLATELET
Basophils Absolute: 0 10*3/uL (ref 0.0–0.2)
Basos: 1 %
EOS (ABSOLUTE): 0.3 10*3/uL (ref 0.0–0.4)
Eos: 4 %
Hematocrit: 42.2 % (ref 37.5–51.0)
Hemoglobin: 14 g/dL (ref 13.0–17.7)
Immature Grans (Abs): 0 10*3/uL (ref 0.0–0.1)
Immature Granulocytes: 0 %
Lymphocytes Absolute: 1.4 10*3/uL (ref 0.7–3.1)
Lymphs: 20 %
MCH: 28.3 pg (ref 26.6–33.0)
MCHC: 33.2 g/dL (ref 31.5–35.7)
MCV: 85 fL (ref 79–97)
Monocytes Absolute: 0.4 10*3/uL (ref 0.1–0.9)
Monocytes: 6 %
Neutrophils Absolute: 4.7 10*3/uL (ref 1.4–7.0)
Neutrophils: 69 %
Platelets: 172 10*3/uL (ref 150–450)
RBC: 4.94 x10E6/uL (ref 4.14–5.80)
RDW: 12.9 % (ref 11.6–15.4)
WBC: 6.8 10*3/uL (ref 3.4–10.8)

## 2022-10-16 LAB — HEMOGLOBIN A1C
Est. average glucose Bld gHb Est-mCnc: 160 mg/dL
Hgb A1c MFr Bld: 7.2 % — ABNORMAL HIGH (ref 4.8–5.6)

## 2022-10-16 LAB — B12 AND FOLATE PANEL
Folate: 8.9 ng/mL (ref >3.0)
Vitamin B-12: 502 pg/mL (ref 232–1245)

## 2022-10-16 LAB — MICROALBUMIN / CREATININE URINE RATIO
Creatinine, Urine: 53.7 mg/dL
Microalb/Creat Ratio: 20 mg/g creat (ref 0–29)
Microalbumin, Urine: 10.8 ug/mL

## 2022-10-16 LAB — LIPID PANEL
Chol/HDL Ratio: 4.2 ratio (ref 0.0–5.0)
Cholesterol, Total: 157 mg/dL (ref 100–199)
HDL: 37 mg/dL — ABNORMAL LOW (ref >39)
LDL Chol Calc (NIH): 102 mg/dL — ABNORMAL HIGH (ref 0–99)
Triglycerides: 96 mg/dL (ref 0–149)
VLDL Cholesterol Cal: 18 mg/dL (ref 5–40)

## 2022-10-16 LAB — COMPREHENSIVE METABOLIC PANEL
ALT: 17 IU/L (ref 0–44)
AST: 17 IU/L (ref 0–40)
Albumin/Globulin Ratio: 2 (ref 1.2–2.2)
Albumin: 4.7 g/dL (ref 3.9–4.9)
Alkaline Phosphatase: 73 IU/L (ref 44–121)
BUN/Creatinine Ratio: 12 (ref 10–24)
BUN: 9 mg/dL (ref 8–27)
Bilirubin Total: 0.7 mg/dL (ref 0.0–1.2)
CO2: 23 mmol/L (ref 20–29)
Calcium: 9.6 mg/dL (ref 8.6–10.2)
Chloride: 103 mmol/L (ref 96–106)
Creatinine, Ser: 0.73 mg/dL — ABNORMAL LOW (ref 0.76–1.27)
Globulin, Total: 2.4 g/dL (ref 1.5–4.5)
Glucose: 119 mg/dL — ABNORMAL HIGH (ref 70–99)
Potassium: 4.8 mmol/L (ref 3.5–5.2)
Sodium: 144 mmol/L (ref 134–144)
Total Protein: 7.1 g/dL (ref 6.0–8.5)
eGFR: 98 mL/min/{1.73_m2} (ref >59)

## 2022-10-16 LAB — METHYLMALONIC ACID, SERUM: Methylmalonic Acid: 194 nmol/L (ref 0–378)

## 2022-10-16 LAB — CARDIOVASCULAR RISK ASSESSMENT

## 2022-10-16 NOTE — Progress Notes (Signed)
Blood count normal.  Liver function normal.  Kidney function normal.  Cholesterol: LDL little up at 102. Continue zetia 10 mg daily and crestor 40 mg before bed.  HBA1C: improved down to 7.2. Changes made to medications at appt. Not spilling protein in urine. B12, folate, mma normal.

## 2022-10-18 ENCOUNTER — Telehealth: Payer: Self-pay

## 2022-10-18 NOTE — Progress Notes (Unsigned)
10-18-2022: 1st attempt left wife VM to and schedule visit with Arizona Constable 10-19-2022: 2nd attempt left wife VM to and schedule visit with Arizona Constable 10-22-2022: appointment scheduled   [place] Pharmacist Assistant [phone]

## 2022-10-18 NOTE — Progress Notes (Signed)
   10/18/2022  Patient ID: Joshua Nelson, male   DOB: 01/13/1951, 72 y.o.   MRN: [id number]  Subjective/Objective:  -Patient outreach to follow up on medications and recent home blood glucose readings. Spoke with Joshua Nelson wife, Joshua Nelson.   -It was recently noted they had ran out of Ozempic '2mg'$  pens, because they did not realize these were multidose; and the pens were discarded after giving 1 dose.  One pen sample was given at last office visit, but this will not last until next PAP shipment on 12/02/22.  -Incorrect Fiasp formulation was also sent to Dr. Alyse Nelson office, but vocher was provided to cover medication until pens are received.  Joshua Nelson endorsed having Fiasp on hand.   -Six months of Tyler Aas was filled on 09/11/22, but patient still does not have this medication and is currently using 35 units of Lantus once a daily instead.   -Wife was unable to provide blood glucose readings but states they have been much better than they were previously  Assessment/Plan:  -Requesting 1 additional '2mg'$  pen sample from PCP office if available to get patient to next delivery.   -Verified they now know and are comfortable with administration of Ozemipc- states education was provided at 2/29 visit. -Contacting office to verify if Tyler Aas was received; if not, I will contact [facility] to check on status -Informed wife to expect a call from office once [place] have arrived -Instructed to monitor and record blood glucose daily and scheduled follow-up for 2 weeks to look at readings  Joshua Nelson, PharmD, DPLA

## 2022-10-25 ENCOUNTER — Ambulatory Visit: Payer: PPO

## 2022-10-25 NOTE — Patient Outreach (Signed)
Care Management & Coordination Services Pharmacy Note  10/25/2022 Name:  Joshua Nelson MRN:  [id number] DOB:  08-10-1951   Recommendations/Changes made from today's visit: -Patient and the wife state that PCP cut Lantus from 65 units ->30 units. Will co-sign PCP to double check and will update medlist if true -Patient very non-compliant on non-dm meds per fill Hx   Subjective: Joshua Nelson is an 72 y.o. year old male who is a primary patient of Cox, Kirsten, MD.  The care coordination team was consulted for assistance with disease management and care coordination needs.    Engaged with patient by telephone for follow up visit.  Recent office visits:  07-27-2022  Vanita Ingles, RN (CCM)   Recent consult visits:  None   Hospital visits:  None in previous 6 months   Objective:  Lab Results  Component Value Date   CREATININE 0.73 (L) 10/11/2022   BUN 9 10/11/2022   EGFR 98 10/11/2022   GFRNONAA >60 07/26/2021   GFRAA 105 06/28/2020   NA 144 10/11/2022   K 4.8 10/11/2022   CALCIUM 9.6 10/11/2022   CO2 23 10/11/2022   GLUCOSE 119 (H) 10/11/2022    Lab Results  Component Value Date/Time   HGBA1C 7.2 (H) 10/11/2022 08:18 AM   HGBA1C 8.2 (H) 07/10/2022 08:29 AM   MICROALBUR 150 08/18/2021 08:47 AM   MICROALBUR 10 10/17/2020 09:50 AM    Last diabetic Eye exam:  Lab Results  Component Value Date/Time   HMDIABEYEEXA No Retinopathy 09/25/2019 12:00 AM    Last diabetic Foot exam: No results found for: "HMDIABFOOTEX"   Lab Results  Component Value Date   CHOL 157 10/11/2022   HDL 37 (L) 10/11/2022   LDLCALC 102 (H) 10/11/2022   TRIG 96 10/11/2022   CHOLHDL 4.2 10/11/2022       Latest Ref Rng & Units 10/11/2022    8:18 AM 07/10/2022    8:29 AM 04/02/2022    8:37 AM  Hepatic Function  Total Protein 6.0 - 8.5 g/dL 7.1  7.2  7.0   Albumin 3.9 - 4.9 g/dL 4.7  4.6  4.5   AST 0 - 40 IU/L '17  15  12   '$ ALT 0 - 44 IU/L '17  16  16   '$ Alk Phosphatase 44 - 121 IU/L 73   78  78   Total Bilirubin 0.0 - 1.2 mg/dL 0.7  0.6  0.5     Lab Results  Component Value Date/Time   TSH 0.897 07/10/2022 08:29 AM   TSH 0.687 06/28/2020 09:30 AM       Latest Ref Rng & Units 10/11/2022    8:18 AM 07/10/2022    8:29 AM 04/02/2022    8:37 AM  CBC  WBC 3.4 - 10.8 x10E3/uL 6.8  7.8  7.9   Hemoglobin 13.0 - 17.7 g/dL 14.0  14.0  14.5   Hematocrit 37.5 - 51.0 % 42.2  42.3  45.1   Platelets 150 - 450 x10E3/uL 172  165  185     Lab Results  Component Value Date/Time   VITAMINB12 502 10/11/2022 08:18 AM    Clinical ASCVD: Yes  The ASCVD Risk score (Arnett DK, et al., 2019) failed to calculate for the following reasons:   The patient has a prior MI or stroke diagnosis    Other: (CHADS2VASc if Afib, MMRC or CAT for COPD, ACT, DEXA)     07/27/2022   11:21 AM 07/09/2022    2:28 PM  04/05/2022    1:36 PM  Depression screen PHQ 2/9  Decreased Interest 0 0 0  Down, Depressed, Hopeless 0 0 0  PHQ - 2 Score 0 0 0     Social History   Tobacco Use  Smoking Status Former   Types: Cigarettes   Quit date: 2003   Years since quitting: 21.2  Smokeless Tobacco Never   BP Readings from Last 3 Encounters:  10/11/22 128/72  07/09/22 110/70  04/05/22 120/64   Pulse Readings from Last 3 Encounters:  10/11/22 80  07/09/22 80  04/05/22 88   Wt Readings from Last 3 Encounters:  10/11/22 201 lb (91.2 kg)  07/09/22 206 lb 12.8 oz (93.8 kg)  04/05/22 207 lb (93.9 kg)   BMI Readings from Last 3 Encounters:  10/11/22 29.68 kg/m  07/09/22 30.54 kg/m  04/05/22 30.57 kg/m    No Known Allergies  Medications Reviewed Today     Reviewed by Lane Hacker, [facility] (Pharmacist) on 10/25/22 at [place] List Status: <None>   Medication Order Taking? Sig Documenting Provider Last Dose Status Informant  Continuous Blood Gluc Sensor (FREESTYLE LIBRE 2 SENSOR) MISC [id number] No Change sensor every 10 day as directed Cox, Kirsten, MD Taking Active   ezetimibe (ZETIA) 10 MG  tablet [id number] No TAKE ONE (1) TABLET ONCE DAILY Cox, Kirsten, MD Taking Active Spouse/Significant Other  FARXIGA 10 MG TABS tablet [id number] No TAKE 1 TABLET BY MOUTH ONCE DAILY IN THE MORNING AS DIRECTED. Cox, Kirsten, MD Taking Active   gabapentin (NEURONTIN) 300 MG capsule [id number] No Take 1 capsule (300 mg total) by mouth 3 (three) times daily. Cox, Kirsten, MD Taking Active   insulin lispro [facility]) 100 UNIT/ML KwikPen [id number] No Before meals  Patient not taking: Reported on 10/11/2022   [name], MD Not Taking Active   LANTUS SOLOSTAR 100 UNIT/ML Solostar Pen [id number] No Inject 65 Units into the skin daily. Pt takes in the pm per wife  Patient not taking: Reported on 10/11/2022   [name], MD Not Taking Active   lisinopril (ZESTRIL) 5 MG tablet [id number] No Once daily in the am Cox, Elnita Maxwell, MD Taking Active   Meclizine HCl 25 MG CHEW [id number] No TAKE 1 TABLET THREE TIMES DAILY AS NEEDED FOR DIZZINESS Cox, Kirsten, MD Taking Active   metoprolol succinate (TOPROL-XL) 25 MG 24 hr tablet [id number] No TAKE 1 TABLET BY MOUTH ONCE DAILY IN THE MORNING. Cox, Kirsten, MD Taking Active   mirabegron ER (MYRBETRIQ) 50 MG TB24 tablet [id number] No Take 1 tablet (50 mg total) by mouth daily.  Patient not taking: Reported on 10/11/2022   [name], MD Not Taking Active   rosuvastatin (CRESTOR) 40 MG tablet [id number] No Take 1 tablet (40 mg total) by mouth daily. Cox, Kirsten, MD Taking Active   Semaglutide [facility], 2 MG/DOSE, [place]) [id number] No Inject 2 mg into the skin once a week. [provider] Taking Active             SDOH:  (Social Determinants of Health) assessments and interventions performed: Yes SDOH Interventions    Flowsheet Row Care Coordination from 10/25/2022 in [place] Management from 07/27/2022 in [place] Management from 07/25/2022 in [place]  Management from 05/29/2022 in [place] Management from 02/05/2022 in [place] Office Visit from 12/21/2021 in [place]  Interventions        Food Insecurity Interventions -- Intervention Not Indicated -- -- -- Intervention Not Indicated  Housing Interventions -- Intervention Not Indicated -- -- -- Intervention Not Indicated  Transportation Interventions Intervention Not Indicated Intervention Not Indicated Intervention Not Indicated Intervention Not Indicated Intervention Not Indicated Intervention Not Indicated  Utilities Interventions -- Intervention Not Indicated -- -- -- --  Alcohol Usage Interventions -- Intervention Not Indicated (Score <7) -- -- -- --  Financial Strain Interventions Other (Comment)  [PAP] Other (Comment)  [pap for medications, working with pharm D] Other (Comment)  [PAP] Other (Comment)  [PAP] Other (Comment)  [PAP (See CP)] Other (Comment)  [Trulicity is costly, applying for patient assistance]  Physical Activity Interventions -- Intervention Not Indicated -- -- -- Intervention Not Indicated  Stress Interventions -- Intervention Not Indicated -- -- -- --       Medication Assistance: (See note)  Name and location of Current pharmacy:  [place], [place] - [address] [place] [address] Phone: [phone] Fax: [phone]  [facility] - [place], [place] - [address] [place] [address] Phone: [phone] Fax: [phone]  [facility] - [place], [place] - [address] [place] [address] Phone: [phone] Fax: [phone]   Compliance/Adherence/Medication fill history:  Star Rating Drugs:  Farxiga 10 mg- PAP Rosuvastatin 40 mg- Last filled 04-05-2022 90 DS. No previous fill.(Refills needed sent to PCP) Lisinopril 5  mg- Last filled 07-23-2022 90 DS. Previous 08-21-2021 90 DS Ozempic 2 mg- PAP in process   Care Gaps: Annual wellness visit in last year? Yes Last eye exam / retinopathy screening:  03-2021   Last diabetic foot exam: 05-22-2022   Assessment/Plan   Hypertension (BP goal <140/90) BP Readings from Last 3 Encounters:  10/11/22 128/72  07/09/22 110/70  04/05/22 120/64    Pulse Readings from Last 3 Encounters:  10/11/22 80  07/09/22 80  04/05/22 88   -Controlled -Current treatment: Lisinopril '5mg'$  QD Appropriate, Effective, Safe, Accessible Metoprolol Succ '25mg'$  QD Appropriate, Effective, Safe, Accessible -Medications previously tried: N/A  -Current home readings: Doesn't test -Current dietary habits: "Tries to eat healthy" -Current exercise habits: None -Denies hypotensive/hypertensive symptoms -Educated on BP goals and benefits of medications for prevention of heart attack, stroke and kidney damage; -Counseled to monitor BP at home PRN, document, and provide log at future appointments March 2024: Patient is very non-compliant on these meds. Spoke with him about it but he states he takes it daily, just that the pharmacy gave him extras last year   Hyperlipidemia: (LDL goal < 70) The ASCVD Risk score (Arnett DK, et al., 2019) failed to calculate for the following reasons:   The patient has a prior MI or stroke diagnosis Lab Results  Component Value Date   CHOL 157 10/11/2022   CHOL 125 07/10/2022   CHOL 209 (H) 04/02/2022   Lab Results  Component Value Date   HDL 37 (L) 10/11/2022   HDL 32 (L) 07/10/2022   HDL 40 04/02/2022   Lab Results  Component Value Date   LDLCALC 102 (H) 10/11/2022   LDLCALC 78 07/10/2022   LDLCALC 146 (H) 04/02/2022   Lab Results  Component Value Date   TRIG 96 10/11/2022   TRIG 72 07/10/2022   TRIG 126 04/02/2022   Lab Results  Component Value Date   CHOLHDL 4.2  10/11/2022   CHOLHDL 3.9 07/10/2022   CHOLHDL 5.2 (H) 04/02/2022   No  results found for: "LDLDIRECT" Last vitamin D No results found for: "25OHVITD2", "25OHVITD3", "VD25OH" Lab Results  Component Value Date   TSH 0.897 07/10/2022   -Uncontrolled -Current treatment: Ezetimibe '10mg'$  Appropriate, Effective, Safe, Accessible Rosuvastatin '40mg'$  Appropriate, Effective, Safe, Accessible -Medications previously tried: Atorvastatin -Current dietary patterns: "Tries to eat healthy" -Current exercise habits: None -Educated on Cholesterol goals;  Jan 2023: Main priority was Elenor Legato today, will talk about statin compliance next week March 2024: Patient is very non-compliant on these meds. Spoke with him about it but he states he takes it daily, just that the pharmacy gave him extras last year   Diabetes (A1c goal <8%) Lab Results  Component Value Date   HGBA1C 7.2 (H) 10/11/2022   HGBA1C 8.2 (H) 07/10/2022   HGBA1C 8.3 (H) 04/02/2022   Lab Results  Component Value Date   MICROALBUR 150 08/18/2021   LDLCALC 102 (H) 10/11/2022   CREATININE 0.73 (L) 10/11/2022    Lab Results  Component Value Date   NA 144 10/11/2022   K 4.8 10/11/2022   CREATININE 0.73 (L) 10/11/2022   EGFR 98 10/11/2022   GFRNONAA >60 07/26/2021   GLUCOSE 119 (H) 10/11/2022    Lab Results  Component Value Date   WBC 6.8 10/11/2022   HGB 14.0 10/11/2022   HCT 42.2 10/11/2022   MCV 85 10/11/2022   PLT 172 10/11/2022    Lab Results  Component Value Date   LABMICR 10.8 10/11/2022   LABMICR 3.0 07/09/2022   MICROALBUR 150 08/18/2021   MICROALBUR 10 10/17/2020   -Uncontrolled -Current medications: Libre Appropriate, Query effective, Safe, Query accessible Dapagliflozin '10mg'$  Appropriate, Effective, Safe, Accessible 2023: Approved 2024: Approved Ozempic Appropriate, Effective, Safe, Accessible 2023: Approved 2024: Approved ASPART SS Appropriate, Effective, Safe, Accessible 2023: Approved 2024: Approved Lantus 65 units Appropriate, Effective, Safe, Accessible 2023:  Approved 2024: Approved -Medications previously tried: None  -Current home glucose readings fasting glucose:  Jan 2023: Doesn't test, doesn't have meter/Libre March 2024: Didn't have numbers on him post prandial glucose:  -Denies hypoglycemic/hyperglycemic symptoms -Current exercise: None -Educated on A1c and blood sugar goals; -Counseled to check feet daily and get yearly eye exams Jan 2023: Main priority was getting Libre. Spoke with wife, will call  [phone], [facility], to see status of Elenor Legato. Will f/u next week to hear what next step is. Patient unable to get Trulicity 4.'5mg'$ /week, had to get 3.'0mg'$  pen from Rx 09/01/21: They called [facility] and was told they only have Decomm in stock and need a new script. Wife did not call me and has been waiting for script to be sent in. She came to office and picked up [place] sample to last until [facility], but they haven't started using it yet. Will get DEXCOMM sent ASAP June 2023: PAP for Ozempic and Dapagliflozin sent to patient at beginning of the month. They received it and thought it was the Trulicity PAP that they got last month so never called or finished it. Counseled them on the difference and to call if you have questions. Patient originally didn't answer phone for visit today, had to call his wife. Then patient called back. Counseled they have to bring in ASAP for [place] to send out October 2023: Waiting on final approval. Gave patient number to call today to check on status. Highly encouraged he call December 2023: Patient has meds for Farxiga and both insulins per patient. He is  waiting on Ozempic, however that is no longer on his medslist. Will ask PCP if she still wants patient on this March 2024: Patient didn't have numbers on him. Congratulated him on A1c. Per patient, he's only taking 30 units of Lantus but medslist says 65 units. Will ask PCP   Chronic Pain -Controlled -Current treatment: Gabapentin '300mg'$  QD Appropriate, Effective, Safe,  Accessible IBU '800mg'$  TID PRN Appropriate, Effective, Safe, Accessible -Medications previously tried: None  -Pain Scale There were no vitals filed for this visit.  Aggravating Factors: Movement  Pain Type: Patient didn't specify  Jan 2023: Main priority was Elenor Legato, will go over pain at f/u June 2023: Main priority was cost of meds December 2023: Patient content with therapy    CPP F/U November 2024  [place] Constable, Pharm.D. - [phone]

## 2022-10-31 ENCOUNTER — Ambulatory Visit (INDEPENDENT_AMBULATORY_CARE_PROVIDER_SITE_OTHER): Payer: PPO

## 2022-10-31 ENCOUNTER — Telehealth: Payer: HMO

## 2022-10-31 DIAGNOSIS — Z794 Long term (current) use of insulin: Secondary | ICD-10-CM

## 2022-10-31 DIAGNOSIS — I119 Hypertensive heart disease without heart failure: Secondary | ICD-10-CM

## 2022-10-31 DIAGNOSIS — E1142 Type 2 diabetes mellitus with diabetic polyneuropathy: Secondary | ICD-10-CM

## 2022-10-31 NOTE — Chronic Care Management (AMB) (Addendum)
Chronic Care Management   CCM RN Visit Note  10/31/2022 Name: Joshua Nelson MRN: [id number] DOB: [birth year]  Subjective: Joshua Nelson is a 72 y.o. year old male who is a primary care patient of Cox, Kirsten, MD. The patient was referred to the Chronic Care Management team for assistance with care management needs subsequent to provider initiation of CCM services and plan of care.    Today's Visit:  Engaged with patient by telephone for follow up visit.        Goals Addressed             This Visit's Progress    CCM Expected Outcome:  Monitor, Self-Manage and Reduce Symptoms of Diabetes       Current Barriers:  Knowledge Deficits related to the need to control DM and keep A1C level down to prevent issues with body systems Care Coordination needs related to financial cost of DM medication, working with pharm D in a patient with DM Chronic Disease Management support and education needs related to effective management of DM Financial Constraints.  Lab Results  Component Value Date   HGBA1C 7.2 (H) 10/11/2022    Previous 07-10-2022 8.2 Planned Interventions: Provided education to patient about basic DM disease process; Reviewed medications with patient and discussed importance of medication adherence. The patient is working with the pharm D on medication cost contraints. The next shipment of Ozempic occurs in April. The patients wife states that he has 2 more pens and that will take him to the middle of April. She knows to call the office for a sample to hold the patient over until his next shipment arrives. She is thankful for the help provided by the team. Will continue to monitor for changes or new needs  ;        Reviewed prescribed diet with patient heart healthy/ADA. Education and support given. The patients wife states that on Sunday's his blood sugars are usually more elevated but it is a combination of reasons like decreased activity and eating patterns. He is doing well and  denies any acute changes in blood sugar readings.; Counseled on importance of regular laboratory monitoring as prescribed. Has regular lab work;        Discussed plans with patient for ongoing care management follow up and provided patient with direct contact information for care management team;      Provided patient with written educational materials related to hypo and hyperglycemia and importance of correct treatment. Education and support given. The patients highest is usually on Sundays. The patients wife states it has been a long time since she has seen it over 300. She states that he has really done well with getting his blood sugars down. Lowest has been 60's. He does have sx and sx of hypoglycemia when it gets below 100. Education and support provided.       Reviewed scheduled/upcoming provider appointments including: 01-24-2023 at [place] am;         Advised patient, providing education and rationale, to check cbg when you have symptoms of low or high blood sugar and continuously, the patient has a freestyle [place]  and record. The patient continue to use the freestyle libre and it has alarms for lows and highs. His average has been 60 to 170. Review of A1C and praised for getting it down one whole point from last years reading. The patients wife was going to pick up his sensors today. Review of goal of fasting <130 and post prandial  of <180       call provider for findings outside established parameters;       Referral made to pharmacy team for assistance with medication cost and pap forms for assistance with medications for management of DM;       Review of patient status, including review of consultants reports, relevant laboratory and other test results, and medications completed;       Advised patient to discuss changes in DM, questions or concerns with provider;      Screening for signs and symptoms of depression related to chronic disease state;        Assessed social determinant of health  barriers;       Last foot exam by provider was 08-28-2022 with podiatry Last eye exam 03-2021. The patients wife states they are working on getting an appointment for eye exam. Reminder provided today for eye exam.  Symptom Management: Take medications as prescribed   Attend all scheduled provider appointments Call provider office for new concerns or questions  call the Suicide and Crisis Lifeline: 988 call the [place] National Suicide Prevention Lifeline: 681-640-7304 or TTY: 431 663 6435 TTY 450-559-2346) to talk to a trained counselor call 1-800-273-TALK (toll free, 24 hour hotline) if experiencing a Mental Health or [place]  check feet daily for cuts, sores or redness trim toenails straight across manage portion size wash and dry feet carefully every day wear comfortable, cotton socks wear comfortable, well-fitting shoes  Follow Up Plan: Telephone follow up appointment with care management team member scheduled for: 01-02-2023 at 0900 am       CCM Expected Outcome:  Monitor, Self-Manage, and Reduce Symptoms of Hypertension       Current Barriers:  Chronic Disease Management support and education needs related to effective management of HTN BP Readings from Last 3 Encounters:  10/11/22 128/72  07/09/22 110/70  04/05/22 120/64     Planned Interventions: Evaluation of current treatment plan related to hypertension self management and patient's adherence to plan as established by provider. The patient has stable blood pressures. The patients wife denies any new issues related to HTN or heart health, saw the pcp recently and blood pressures are controlled at this time;   Provided education to patient re: stroke prevention, s/s of heart attack and stroke; Reviewed prescribed diet heart healthy/ADA diet. The patient is compliant with heart healthy/ADA diet. Blood sugars are a little elevated on Sundays due to decreased activity Reviewed medications with patient and  discussed importance of compliance. The patient is compliant with medications. Works with pharm D for education and ongoing support;  Discussed plans with patient for ongoing care management follow up and provided patient with direct contact information for care management team; Advised patient, providing education and rationale, to monitor blood pressure daily and record, calling PCP for findings outside established parameters;  Reviewed scheduled/upcoming provider appointments including: 01-24-2023 at [place] am Advised patient to discuss changes in HTN or heart health with provider; Provided education on prescribed diet heart healthy/ADA diet ;  Discussed complications of poorly controlled blood pressure such as heart disease, stroke, circulatory complications, vision complications, kidney impairment, sexual dysfunction;  Screening for signs and symptoms of depression related to chronic disease state;  Assessed social determinant of health barriers;   Symptom Management: Take medications as prescribed   Attend all scheduled provider appointments Call provider office for new concerns or questions  call the Suicide and Crisis Lifeline: 988 call the [place] National Suicide Prevention Lifeline: (504)187-5784 or TTY:  765 250 8065 TTY (807) 255-8220) to talk to a trained counselor call 1-800-273-TALK (toll free, 24 hour hotline) if experiencing a Mental Health or [place]  check blood pressure weekly learn about high blood pressure call doctor for signs and symptoms of high blood pressure develop an action plan for high blood pressure keep all doctor appointments take medications for blood pressure exactly as prescribed report new symptoms to your doctor  Follow Up Plan: Telephone follow up appointment with care management team member scheduled for: 01-02-2023 at 0900 am          Plan:Telephone follow up appointment with care management team member scheduled for:  01-02-2023 at  0900 am  [place], MSN, CCM RN Care Manager  Chronic Care Management Direct Number: [phone]

## 2022-10-31 NOTE — Patient Instructions (Signed)
Please call the care guide team at [phone] if you need to cancel or reschedule your appointment.   If you are experiencing a Mental Health or [place] or need someone to talk to, please call the Suicide and Crisis Lifeline: 988 call the [place] National Suicide Prevention Lifeline: (810) 763-4987 or TTY: 551 025 6854 TTY (442)084-1709) to talk to a trained counselor call 1-800-273-TALK (toll free, 24 hour hotline) go to [facility] [place] [phone])   Following is a copy of the CCM Program Consent:  CCM service includes personalized support from designated clinical staff supervised by the physician, including individualized plan of care and coordination with other care providers 24/7 contact phone numbers for assistance for urgent and routine care needs. Service will only be billed when office clinical staff spend 20 minutes or more in a month to coordinate care. Only one practitioner may furnish and bill the service in a calendar month. The patient may stop CCM services at amy time (effective at the end of the month) by phone call to the office staff. The patient will be responsible for cost sharing (co-pay) or up to 20% of the service fee (after annual deductible is met)  Following is a copy of your full provider care plan:   Goals Addressed             This Visit's Progress    CCM Expected Outcome:  Monitor, Self-Manage and Reduce Symptoms of Diabetes       Current Barriers:  Knowledge Deficits related to the need to control DM and keep A1C level down to prevent issues with body systems Care Coordination needs related to financial cost of DM medication, working with pharm D in a patient with DM Chronic Disease Management support and education needs related to effective management of DM Financial Constraints.  Lab Results  Component Value Date   HGBA1C 7.2 (H) 10/11/2022    Previous 07-10-2022  8.2 Planned Interventions: Provided education to patient about basic DM disease process; Reviewed medications with patient and discussed importance of medication adherence. The patient is working with the pharm D on medication cost contraints. The next shipment of Ozempic occurs in April. The patients wife states that he has 2 more pens and that will take him to the middle of April. She knows to call the office for a sample to hold the patient over until his next shipment arrives. She is thankful for the help provided by the team. Will continue to monitor for changes or new needs  ;        Reviewed prescribed diet with patient heart healthy/ADA. Education and support given. The patients wife states that on Sunday's his blood sugars are usually more elevated but it is a combination of reasons like decreased activity and eating patterns. He is doing well and denies any acute changes in blood sugar readings.; Counseled on importance of regular laboratory monitoring as prescribed. Has regular lab work;        Discussed plans with patient for ongoing care management follow up and provided patient with direct contact information for care management team;      Provided patient with written educational materials related to hypo and hyperglycemia and importance of correct treatment. Education and support given. The patients highest is usually on Sundays. The patients wife states it has been a long time since she has seen it over 300. She states that he has really done well with getting his blood sugars down. Lowest  has been 60's. He does have sx and sx of hypoglycemia when it gets below 100. Education and support provided.       Reviewed scheduled/upcoming provider appointments including: 01-24-2023 at [place] am;         Advised patient, providing education and rationale, to check cbg when you have symptoms of low or high blood sugar and continuously, the patient has a freestyle [place]  and record. The patient continue  to use the freestyle libre and it has alarms for lows and highs. His average has been 60 to 170. Review of A1C and praised for getting it down one whole point from last years reading. The patients wife was going to pick up his sensors today. Review of goal of fasting <130 and post prandial of <180       call provider for findings outside established parameters;       Referral made to pharmacy team for assistance with medication cost and pap forms for assistance with medications for management of DM;       Review of patient status, including review of consultants reports, relevant laboratory and other test results, and medications completed;       Advised patient to discuss changes in DM, questions or concerns with provider;      Screening for signs and symptoms of depression related to chronic disease state;        Assessed social determinant of health barriers;       Last foot exam by provider was 08-28-2022 with podiatry Last eye exam 03-2021. The patients wife states they are working on getting an appointment for eye exam. Reminder provided today for eye exam.  Symptom Management: Take medications as prescribed   Attend all scheduled provider appointments Call provider office for new concerns or questions  call the Suicide and Crisis Lifeline: 988 call the [place] National Suicide Prevention Lifeline: 6237636263 or TTY: (309) 327-7072 TTY (802)672-0774) to talk to a trained counselor call 1-800-273-TALK (toll free, 24 hour hotline) if experiencing a Mental Health or [place]  check feet daily for cuts, sores or redness trim toenails straight across manage portion size wash and dry feet carefully every day wear comfortable, cotton socks wear comfortable, well-fitting shoes  Follow Up Plan: Telephone follow up appointment with care management team member scheduled for: 01-02-2023 at 0900 am       CCM Expected Outcome:  Monitor, Self-Manage, and Reduce Symptoms of Hypertension        Current Barriers:  Chronic Disease Management support and education needs related to effective management of HTN BP Readings from Last 3 Encounters:  10/11/22 128/72  07/09/22 110/70  04/05/22 120/64     Planned Interventions: Evaluation of current treatment plan related to hypertension self management and patient's adherence to plan as established by provider. The patient has stable blood pressures. The patients wife denies any new issues related to HTN or heart health, saw the pcp recently and blood pressures are controlled at this time;   Provided education to patient re: stroke prevention, s/s of heart attack and stroke; Reviewed prescribed diet heart healthy/ADA diet. The patient is compliant with heart healthy/ADA diet. Blood sugars are a little elevated on Sundays due to decreased activity Reviewed medications with patient and discussed importance of compliance. The patient is compliant with medications. Works with pharm D for education and ongoing support;  Discussed plans with patient for ongoing care management follow up and provided patient with direct contact information for care management team; Advised  patient, providing education and rationale, to monitor blood pressure daily and record, calling PCP for findings outside established parameters;  Reviewed scheduled/upcoming provider appointments including: 01-24-2023 at [place] am Advised patient to discuss changes in HTN or heart health with provider; Provided education on prescribed diet heart healthy/ADA diet ;  Discussed complications of poorly controlled blood pressure such as heart disease, stroke, circulatory complications, vision complications, kidney impairment, sexual dysfunction;  Screening for signs and symptoms of depression related to chronic disease state;  Assessed social determinant of health barriers;   Symptom Management: Take medications as prescribed   Attend all scheduled provider appointments Call  provider office for new concerns or questions  call the Suicide and Crisis Lifeline: 988 call the [place] National Suicide Prevention Lifeline: 314 051 7625 or TTY: (873)120-0725 TTY 641-065-2798) to talk to a trained counselor call 1-800-273-TALK (toll free, 24 hour hotline) if experiencing a Mental Health or [place]  check blood pressure weekly learn about high blood pressure call doctor for signs and symptoms of high blood pressure develop an action plan for high blood pressure keep all doctor appointments take medications for blood pressure exactly as prescribed report new symptoms to your doctor  Follow Up Plan: Telephone follow up appointment with care management team member scheduled for: 01-02-2023 at 0900 am          The patient verbalized understanding of instructions, educational materials, and care plan provided today and DECLINED offer to receive copy of patient instructions, educational materials, and care plan.   Telephone follow up appointment with care management team member scheduled for: 01-02-2023 at 0900 am

## 2022-11-01 ENCOUNTER — Other Ambulatory Visit: Payer: HMO

## 2022-11-01 NOTE — Progress Notes (Signed)
   11/01/2022  Patient ID: Joshua Nelson, male   DOB: [birth year], 72 y.o.   MRN: [id number]  Subjective/Objective: Telephone follow-up visit to check on home BG control and medication adherence. -Spoke with wife, Joshua Nelson, who is on designated party release -Endorses BG values have been much better- could not provide recent values but states usually in 100's now -Using [employer] 2 for CGM -Currently taking:  Farxiga 10mg  daily, Lantus 35 units at bedtime, Fiasp 16 units with breakfast and supper, Ozempic 2mg  once weekly -Has Tresiba at home and will start once they have used on hand Lantus -Does not endorse any s/sx of hypoglycemia -States no current concerns in regard to medication access/affordability and endorses good adherence  Assessment/Plan: -Medication list updated to accurately reflect current therapy -Continue current regimen and contact office if patient consistently hypo (<70) or hyperglycemic (>180 post-prandial) -Patient followed by [facility] pharmacist; defaulting care to [place] and informing PCP and PharmD to consult me if there are needs I can assist with in the future  Darlina Guys, PharmD, DPLA

## 2022-11-11 DIAGNOSIS — Z794 Long term (current) use of insulin: Secondary | ICD-10-CM

## 2022-11-11 DIAGNOSIS — E1159 Type 2 diabetes mellitus with other circulatory complications: Secondary | ICD-10-CM

## 2022-11-11 DIAGNOSIS — I10 Essential (primary) hypertension: Secondary | ICD-10-CM | POA: Diagnosis not present

## 2022-11-12 NOTE — Progress Notes (Signed)
I attest that I have reviewed this visit and agree with the plan scribed by my staff.  Dr. [facility] [phone]

## 2022-11-15 ENCOUNTER — Telehealth: Payer: Self-pay

## 2022-11-15 NOTE — Progress Notes (Cosign Needed)
[employer] patient assistance program notification:  120- day supply of Fiasp and Ozempic will be filled on 12/02/2022 and should arrive to the office in 10-14 business days. Patient  enrollment will expire on 08/13/2023.   Pattricia Boss, [place] Pharmacist Assistant [phone]

## 2022-11-26 ENCOUNTER — Telehealth: Payer: Self-pay

## 2022-11-26 NOTE — Telephone Encounter (Signed)
PATIENT OBTAINED HIS PATIENT ASSISTANCE WHICH WAS FOR NOVO FINE NEEDLES AND FIASP INSULIN.

## 2022-11-27 ENCOUNTER — Ambulatory Visit: Payer: HMO | Admitting: Podiatry

## 2022-11-27 DIAGNOSIS — M79675 Pain in left toe(s): Secondary | ICD-10-CM

## 2022-11-27 DIAGNOSIS — B351 Tinea unguium: Secondary | ICD-10-CM | POA: Diagnosis not present

## 2022-11-27 DIAGNOSIS — E1142 Type 2 diabetes mellitus with diabetic polyneuropathy: Secondary | ICD-10-CM

## 2022-11-27 DIAGNOSIS — M79674 Pain in right toe(s): Secondary | ICD-10-CM

## 2022-11-27 NOTE — Progress Notes (Signed)
  Subjective:  Patient ID: Joshua Nelson, male    DOB: [birth year],  MRN: [id number]  Chief Complaint  Patient presents with   Diabetes    Diabetic foot care, nail trim A1c- 8.0 BG-150    72 y.o. male presents with the above complaint. History confirmed with patient. Patient presenting with pain related to dystrophic thickened elongated nails. Patient is unable to trim own nails related to nail dystrophy and/or mobility issues. Patient does have a history of T2DM with peripheral neuropathy.  Objective:  Physical Exam: warm, good capillary refill, DP and PT pulses 1/4 bilateral nail exam onychomycosis of the toenails, onycholysis, and dystrophic nails DP pulses palpable, PT pulses palpable, and protective sensation absent Left Foot:  Pain with palpation of nails due to elongation and dystrophic growth.  Right Foot: Pain with palpation of nails due to elongation and dystrophic growth.   Assessment:   1. Pain due to onychomycosis of toenails of both feet   2. DM type 2 with diabetic peripheral neuropathy        Plan:  Patient was evaluated and treated and all questions answered.  #Onychomycosis with pain  -Nails palliatively debrided as below. -Educated on self-care  Procedure: Nail Debridement Rationale: Pain Type of Debridement: manual, sharp debridement. Instrumentation: Nail nipper, rotary burr. Number of Nails: 10  Return in about 3 months (around 02/26/2023) for [facility].         Corinna Gab, DPM [facility] / [facility]

## 2022-12-06 ENCOUNTER — Other Ambulatory Visit: Payer: Self-pay | Admitting: Family Medicine

## 2022-12-11 ENCOUNTER — Telehealth: Payer: Self-pay

## 2022-12-11 NOTE — Telephone Encounter (Signed)
Patient's wife came by and picked up patient assistance for ozempic 2 mg and [employer].

## 2022-12-11 NOTE — Telephone Encounter (Signed)
Left message for patient to pick-up patient assistance.   Ozempic  Lot # [id number]. Exp.  //

## 2023-01-02 ENCOUNTER — Telehealth: Payer: HMO

## 2023-01-02 ENCOUNTER — Ambulatory Visit (INDEPENDENT_AMBULATORY_CARE_PROVIDER_SITE_OTHER): Payer: PPO

## 2023-01-02 DIAGNOSIS — I119 Hypertensive heart disease without heart failure: Secondary | ICD-10-CM

## 2023-01-02 DIAGNOSIS — Z794 Long term (current) use of insulin: Secondary | ICD-10-CM

## 2023-01-02 NOTE — Chronic Care Management (AMB) (Signed)
Chronic Care Management   CCM RN Visit Note  01/02/2023 Name: Joshua Nelson MRN: [id number] DOB: [birth year]  Subjective: Joshua Nelson is a 72 y.o. year old male who is a primary care patient of Cox, Kirsten, MD. The patient was referred to the Chronic Care Management team for assistance with care management needs subsequent to provider initiation of CCM services and plan of care.    Today's Visit:  Engaged with patient by telephone for follow up visit.        Goals Addressed             This Visit's Progress    CCM Expected Outcome:  Monitor, Self-Manage and Reduce Symptoms of Diabetes       Current Barriers:  Knowledge Deficits related to the need to control DM and keep A1C level down to prevent issues with body systems Care Coordination needs related to financial cost of DM medication, working with pharm D in a patient with DM Chronic Disease Management support and education needs related to effective management of DM Financial Constraints.  Lab Results  Component Value Date   HGBA1C 7.2 (H) 10/11/2022    Previous 07-10-2022 8.2 Planned Interventions: Provided education to patient about basic DM disease process. The patient states he is doing well and the Ozempic is working very good for him. He denies any changes in his DM and management of his DM. Reviewed medications with patient and discussed importance of medication adherence. The patient is working with the pharm D on medication cost contraints. The patient has his medications. Likes the [name] and says it is working well for him. He denies any new needs related to medications at this time.    Reviewed prescribed diet with patient heart healthy/ADA. Education and support given. The patient denies any issues with following a heart healthy/ADA diet. At the time of the call he had just eaten breakfast and his blood sugar was 112. He is doing well and denies any acute changes in blood sugar readings.; Counseled on  importance of regular laboratory monitoring as prescribed. Has regular lab work. Will have new lab work in July at pcp appointment.         Discussed plans with patient for ongoing care management follow up and provided patient with direct contact information for care management team;      Provided patient with written educational materials related to hypo and hyperglycemia and importance of correct treatment. Education and support given. The patients highest is usually on Sundays. The patients wife states it has been a long time since she has seen it over 300. She states that he has really done well with getting his blood sugars down. Lowest has been 60's. He does have sx and sx of hypoglycemia when it gets below 100. Education and support provided.       Reviewed scheduled/upcoming provider appointments including: 02-13-2023 at 11 am;         Advised patient, providing education and rationale, to check cbg when you have symptoms of low or high blood sugar and continuously, the patient has a freestyle [address]  and record. The patient continue to use the freestyle libre and it has alarms for lows and highs. His average has been 60 to 170. Review of A1C and praised for getting it down one whole point from last years reading. At the time of the call his blood sugar was 112. He says his blood sugars have been stable. Review of goal of fasting <130  and post prandial of <180       call provider for findings outside established parameters;       Referral made to pharmacy team for assistance with medication cost and pap forms for assistance with medications for management of DM;       Review of patient status, including review of consultants reports, relevant laboratory and other test results, and medications completed;       Advised patient to discuss changes in DM, questions or concerns with provider;      Screening for signs and symptoms of depression related to chronic disease state;        Assessed social  determinant of health barriers;       Last foot exam by provider was May 2024 with podiatry. States he checks his feet daily for any changes in his foot care. Last eye exam 03-2021. The patients wife states they are working on getting an appointment for eye exam. Reminder provided today for eye exam. The patient states he still has not been able to get an eye exam secured but he is planning on calling them himself. He said he was told they were behind.   Symptom Management: Take medications as prescribed   Attend all scheduled provider appointments Call provider office for new concerns or questions  call the Suicide and Crisis Lifeline: 988 call the [place] National Suicide Prevention Lifeline: 512-861-4056 or TTY: (320) 330-5600 TTY 519-346-0992) to talk to a trained counselor call 1-800-273-TALK (toll free, 24 hour hotline) if experiencing a Mental Health or Behavioral Health Crisis  check feet daily for cuts, sores or redness trim toenails straight across manage portion size wash and dry feet carefully every day wear comfortable, cotton socks wear comfortable, well-fitting shoes  Follow Up Plan: Telephone follow up appointment with care management team member scheduled for: 03-06-2023 at 0900 am       CCM Expected Outcome:  Monitor, Self-Manage, and Reduce Symptoms of Hypertension       Current Barriers:  Chronic Disease Management support and education needs related to effective management of HTN BP Readings from Last 3 Encounters:  10/11/22 128/72  07/09/22 110/70  04/05/22 120/64     Planned Interventions: Evaluation of current treatment plan related to hypertension self management and patient's adherence to plan as established by provider. The patient has stable blood pressures. The patient denies any new issues related to HTN or heart health, saw the pcp recently and blood pressures are controlled at this time;   Provided education to patient re: stroke prevention, s/s of heart  attack and stroke; Reviewed prescribed diet heart healthy/ADA diet. The patient is compliant with heart healthy/ADA diet. Blood sugars are a little elevated on Sundays due to decreased activity Reviewed medications with patient and discussed importance of compliance. The patient is compliant with medications. Works with pharm D for education and ongoing support;  Discussed plans with patient for ongoing care management follow up and provided patient with direct contact information for care management team; Advised patient, providing education and rationale, to monitor blood pressure daily and record, calling PCP for findings outside established parameters;  Reviewed scheduled/upcoming provider appointments including: 02-23-2023 at 11 am Advised patient to discuss changes in HTN or heart health with provider; Provided education on prescribed diet heart healthy/ADA diet ;  Discussed complications of poorly controlled blood pressure such as heart disease, stroke, circulatory complications, vision complications, kidney impairment, sexual dysfunction;  Screening for signs and symptoms of depression related to chronic disease state;  Assessed social determinant of health barriers;   Symptom Management: Take medications as prescribed   Attend all scheduled provider appointments Call provider office for new concerns or questions  call the Suicide and Crisis Lifeline: 988 call the [place] National Suicide Prevention Lifeline: 915-506-7920 or TTY: (469)722-1892 TTY 636-843-5153) to talk to a trained counselor call 1-800-273-TALK (toll free, 24 hour hotline) if experiencing a Mental Health or Behavioral Health Crisis  check blood pressure weekly learn about high blood pressure call doctor for signs and symptoms of high blood pressure develop an action plan for high blood pressure keep all doctor appointments take medications for blood pressure exactly as prescribed report new symptoms to your  doctor  Follow Up Plan: Telephone follow up appointment with care management team member scheduled for: 03-06-2023 at 0900 am          Plan:Telephone follow up appointment with care management team member scheduled for:  03-06-2023 at 0900 am  Alto Denver RN, MSN, CCM RN Care Manager  Chronic Care Management Direct Number: [phone]

## 2023-01-02 NOTE — Patient Instructions (Signed)
Please call the care guide team at [phone] if you need to cancel or reschedule your appointment.   If you are experiencing a Mental Health or Behavioral Health Crisis or need someone to talk to, please call the Suicide and Crisis Lifeline: 988 call the [place] National Suicide Prevention Lifeline: 418-225-8215 or TTY: 609-841-1162 TTY (517)758-2315) to talk to a trained counselor call 1-800-273-TALK (toll free, 24 hour hotline) go to [facility] [address] [place] [phone])   Following is a copy of the CCM Program Consent:  CCM service includes personalized support from designated clinical staff supervised by the physician, including individualized plan of care and coordination with other care providers 24/7 contact phone numbers for assistance for urgent and routine care needs. Service will only be billed when office clinical staff spend 20 minutes or more in a month to coordinate care. Only one practitioner may furnish and bill the service in a calendar month. The patient may stop CCM services at amy time (effective at the end of the month) by phone call to the office staff. The patient will be responsible for cost sharing (co-pay) or up to 20% of the service fee (after annual deductible is met)  Following is a copy of your full provider care plan:   Goals Addressed             This Visit's Progress    CCM Expected Outcome:  Monitor, Self-Manage and Reduce Symptoms of Diabetes       Current Barriers:  Knowledge Deficits related to the need to control DM and keep A1C level down to prevent issues with body systems Care Coordination needs related to financial cost of DM medication, working with pharm D in a patient with DM Chronic Disease Management support and education needs related to effective management of DM Financial Constraints.  Lab Results  Component Value Date   HGBA1C 7.2 (H) 10/11/2022    Previous 07-10-2022  8.2 Planned Interventions: Provided education to patient about basic DM disease process. The patient states he is doing well and the Ozempic is working very good for him. He denies any changes in his DM and management of his DM. Reviewed medications with patient and discussed importance of medication adherence. The patient is working with the pharm D on medication cost contraints. The patient has his medications. Likes the [name] and says it is working well for him. He denies any new needs related to medications at this time.    Reviewed prescribed diet with patient heart healthy/ADA. Education and support given. The patient denies any issues with following a heart healthy/ADA diet. At the time of the call he had just eaten breakfast and his blood sugar was 112. He is doing well and denies any acute changes in blood sugar readings.; Counseled on importance of regular laboratory monitoring as prescribed. Has regular lab work. Will have new lab work in July at pcp appointment.         Discussed plans with patient for ongoing care management follow up and provided patient with direct contact information for care management team;      Provided patient with written educational materials related to hypo and hyperglycemia and importance of correct treatment. Education and support given. The patients highest is usually on Sundays. The patients wife states it has been a long time since she has seen it over 300. She states that he has really done well with getting his blood sugars down. Lowest has been 60's. He does  have sx and sx of hypoglycemia when it gets below 100. Education and support provided.       Reviewed scheduled/upcoming provider appointments including: 02-13-2023 at 11 am;         Advised patient, providing education and rationale, to check cbg when you have symptoms of low or high blood sugar and continuously, the patient has a freestyle [place]  and record. The patient continue to use the freestyle  libre and it has alarms for lows and highs. His average has been 60 to 170. Review of A1C and praised for getting it down one whole point from last years reading. At the time of the call his blood sugar was 112. He says his blood sugars have been stable. Review of goal of fasting <130 and post prandial of <180       call provider for findings outside established parameters;       Referral made to pharmacy team for assistance with medication cost and pap forms for assistance with medications for management of DM;       Review of patient status, including review of consultants reports, relevant laboratory and other test results, and medications completed;       Advised patient to discuss changes in DM, questions or concerns with provider;      Screening for signs and symptoms of depression related to chronic disease state;        Assessed social determinant of health barriers;       Last foot exam by provider was May 2024 with podiatry. States he checks his feet daily for any changes in his foot care. Last eye exam 03-2021. The patients wife states they are working on getting an appointment for eye exam. Reminder provided today for eye exam. The patient states he still has not been able to get an eye exam secured but he is planning on calling them himself. He said he was told they were behind.   Symptom Management: Take medications as prescribed   Attend all scheduled provider appointments Call provider office for new concerns or questions  call the Suicide and Crisis Lifeline: 988 call the [place] National Suicide Prevention Lifeline: (412)732-7238 or TTY: (385)195-6585 TTY 7600985609) to talk to a trained counselor call 1-800-273-TALK (toll free, 24 hour hotline) if experiencing a Mental Health or Behavioral Health Crisis  check feet daily for cuts, sores or redness trim toenails straight across manage portion size wash and dry feet carefully every day wear comfortable, cotton socks wear  comfortable, well-fitting shoes  Follow Up Plan: Telephone follow up appointment with care management team member scheduled for: 03-06-2023 at 0900 am       CCM Expected Outcome:  Monitor, Self-Manage, and Reduce Symptoms of Hypertension       Current Barriers:  Chronic Disease Management support and education needs related to effective management of HTN BP Readings from Last 3 Encounters:  10/11/22 128/72  07/09/22 110/70  04/05/22 120/64     Planned Interventions: Evaluation of current treatment plan related to hypertension self management and patient's adherence to plan as established by provider. The patient has stable blood pressures. The patient denies any new issues related to HTN or heart health, saw the pcp recently and blood pressures are controlled at this time;   Provided education to patient re: stroke prevention, s/s of heart attack and stroke; Reviewed prescribed diet heart healthy/ADA diet. The patient is compliant with heart healthy/ADA diet. Blood sugars are a little elevated on Sundays due to decreased  activity Reviewed medications with patient and discussed importance of compliance. The patient is compliant with medications. Works with pharm D for education and ongoing support;  Discussed plans with patient for ongoing care management follow up and provided patient with direct contact information for care management team; Advised patient, providing education and rationale, to monitor blood pressure daily and record, calling PCP for findings outside established parameters;  Reviewed scheduled/upcoming provider appointments including: 02-23-2023 at 11 am Advised patient to discuss changes in HTN or heart health with provider; Provided education on prescribed diet heart healthy/ADA diet ;  Discussed complications of poorly controlled blood pressure such as heart disease, stroke, circulatory complications, vision complications, kidney impairment, sexual dysfunction;  Screening  for signs and symptoms of depression related to chronic disease state;  Assessed social determinant of health barriers;   Symptom Management: Take medications as prescribed   Attend all scheduled provider appointments Call provider office for new concerns or questions  call the Suicide and Crisis Lifeline: 988 call the [place] National Suicide Prevention Lifeline: 585-474-0458 or TTY: 810-526-4124 TTY 8053858539) to talk to a trained counselor call 1-800-273-TALK (toll free, 24 hour hotline) if experiencing a Mental Health or Behavioral Health Crisis  check blood pressure weekly learn about high blood pressure call doctor for signs and symptoms of high blood pressure develop an action plan for high blood pressure keep all doctor appointments take medications for blood pressure exactly as prescribed report new symptoms to your doctor  Follow Up Plan: Telephone follow up appointment with care management team member scheduled for: 03-06-2023 at 0900 am          The patient verbalized understanding of instructions, educational materials, and care plan provided today and DECLINED offer to receive copy of patient instructions, educational materials, and care plan.  Telephone follow up appointment with care management team member scheduled for: 03-06-2023 at 0900 am

## 2023-01-11 DIAGNOSIS — I10 Essential (primary) hypertension: Secondary | ICD-10-CM | POA: Diagnosis not present

## 2023-01-11 DIAGNOSIS — Z794 Long term (current) use of insulin: Secondary | ICD-10-CM | POA: Diagnosis not present

## 2023-01-11 DIAGNOSIS — E1159 Type 2 diabetes mellitus with other circulatory complications: Secondary | ICD-10-CM | POA: Diagnosis not present

## 2023-01-22 ENCOUNTER — Encounter: Payer: Self-pay | Admitting: *Deleted

## 2023-01-22 NOTE — Progress Notes (Signed)
[facility] Quality Team Note  Name: Joshua Nelson Date of Birth: February 25, 1951 MRN: [id number] Date: 01/22/2023  [facility] Quality Team has reviewed this patient's chart, please see recommendations below:  [facility] Quality Other; Pt has open gaps for AWV, colon screening, and eye exam.  There is already an eye referral on file 10/11/22.  Would provider be able to address colon screening options at ov on 02/13/2023?

## 2023-01-24 ENCOUNTER — Ambulatory Visit: Payer: HMO | Admitting: Family Medicine

## 2023-02-09 ENCOUNTER — Other Ambulatory Visit: Payer: Self-pay

## 2023-02-09 DIAGNOSIS — I119 Hypertensive heart disease without heart failure: Secondary | ICD-10-CM

## 2023-02-09 DIAGNOSIS — Z794 Long term (current) use of insulin: Secondary | ICD-10-CM

## 2023-02-09 DIAGNOSIS — E782 Mixed hyperlipidemia: Secondary | ICD-10-CM

## 2023-02-11 ENCOUNTER — Other Ambulatory Visit: Payer: PPO

## 2023-02-11 DIAGNOSIS — Z794 Long term (current) use of insulin: Secondary | ICD-10-CM

## 2023-02-11 DIAGNOSIS — E1142 Type 2 diabetes mellitus with diabetic polyneuropathy: Secondary | ICD-10-CM | POA: Diagnosis not present

## 2023-02-11 DIAGNOSIS — E782 Mixed hyperlipidemia: Secondary | ICD-10-CM | POA: Diagnosis not present

## 2023-02-11 DIAGNOSIS — I119 Hypertensive heart disease without heart failure: Secondary | ICD-10-CM | POA: Diagnosis not present

## 2023-02-12 ENCOUNTER — Encounter: Payer: Self-pay | Admitting: Family Medicine

## 2023-02-12 LAB — LIPID PANEL
Chol/HDL Ratio: 3.5 ratio (ref 0.0–5.0)
Cholesterol, Total: 138 mg/dL (ref 100–199)
HDL: 40 mg/dL (ref >39)
LDL Chol Calc (NIH): 83 mg/dL (ref 0–99)
Triglycerides: 77 mg/dL (ref 0–149)
VLDL Cholesterol Cal: 15 mg/dL (ref 5–40)

## 2023-02-12 LAB — COMPREHENSIVE METABOLIC PANEL
ALT: 14 IU/L (ref 0–44)
AST: 16 IU/L (ref 0–40)
Albumin: 4.4 g/dL (ref 3.9–4.9)
Alkaline Phosphatase: 65 IU/L (ref 44–121)
BUN/Creatinine Ratio: 15 (ref 10–24)
BUN: 11 mg/dL (ref 8–27)
Bilirubin Total: 0.6 mg/dL (ref 0.0–1.2)
CO2: 24 mmol/L (ref 20–29)
Calcium: 9.5 mg/dL (ref 8.6–10.2)
Chloride: 101 mmol/L (ref 96–106)
Creatinine, Ser: 0.73 mg/dL — ABNORMAL LOW (ref 0.76–1.27)
Globulin, Total: 2.3 g/dL (ref 1.5–4.5)
Glucose: 107 mg/dL — ABNORMAL HIGH (ref 70–99)
Potassium: 4.3 mmol/L (ref 3.5–5.2)
Sodium: 139 mmol/L (ref 134–144)
Total Protein: 6.7 g/dL (ref 6.0–8.5)
eGFR: 98 mL/min/{1.73_m2} (ref >59)

## 2023-02-12 LAB — CBC WITH DIFFERENTIAL/PLATELET
Basophils Absolute: 0 10*3/uL (ref 0.0–0.2)
Basos: 1 %
EOS (ABSOLUTE): 0.3 10*3/uL (ref 0.0–0.4)
Eos: 4 %
Hematocrit: 39.3 % (ref 37.5–51.0)
Hemoglobin: 13.2 g/dL (ref 13.0–17.7)
Immature Grans (Abs): 0 10*3/uL (ref 0.0–0.1)
Immature Granulocytes: 0 %
Lymphocytes Absolute: 1.4 10*3/uL (ref 0.7–3.1)
Lymphs: 18 %
MCH: 29.6 pg (ref 26.6–33.0)
MCHC: 33.6 g/dL (ref 31.5–35.7)
MCV: 88 fL (ref 79–97)
Monocytes Absolute: 0.5 10*3/uL (ref 0.1–0.9)
Monocytes: 6 %
Neutrophils Absolute: 5.5 10*3/uL (ref 1.4–7.0)
Neutrophils: 71 %
Platelets: 155 10*3/uL (ref 150–450)
RBC: 4.46 x10E6/uL (ref 4.14–5.80)
RDW: 13 % (ref 11.6–15.4)
WBC: 7.7 10*3/uL (ref 3.4–10.8)

## 2023-02-12 LAB — HEMOGLOBIN A1C
Est. average glucose Bld gHb Est-mCnc: 128 mg/dL
Hgb A1c MFr Bld: 6.1 % — ABNORMAL HIGH (ref 4.8–5.6)

## 2023-02-12 NOTE — Progress Notes (Unsigned)
Subjective:  Patient ID: Joshua Nelson, male    DOB: 1950/08/31  Age: 72 y.o. MRN: [id number]  Chief Complaint  Patient presents with   Medical Management of Chronic Issues    HPI   Diabetes:  Complications: hyperlipidemia, hypertension, and neuropathy  Glucose checking:  patient has Freestyle Libre 2.  Glucose logs: 61-216 Hypoglycemia: once about a week ago.  Most recent A1C: 6.1 Current medications: Farxiga 10 mg take 1 tablet daily, is not taking Lantus inject 35 units daily.  Patient is taking ozempic 2 mg weekly, Gabapentin 300 mg THREE TIMES A DAY. Not taking fiasp. Last Eye Exam: overdue. 2021. Foot checks: Daily   Hyperlipidemia: Current medications:  Crestor 40 mg  take 1 tablet daily, Zetia 10 mg take 1 tablet daily.    Hypertensive with heart disease:: Current medications: Metoprolol 25 mg take 1 tablet daily, Lisinopril 5 mg  mg take 1 tablet daily   Diet: Fairly healthy most of the time.   Exercise: some walking.      02/13/2023   11:27 AM 07/27/2022   11:21 AM 07/09/2022    2:28 PM 04/05/2022    1:36 PM 12/21/2021   11:49 AM  Depression screen PHQ 2/9  Decreased Interest 2 0 0 0 0  Down, Depressed, Hopeless 0 0 0 0 0  PHQ - 2 Score 2 0 0 0 0  Altered sleeping 0      Tired, decreased energy 1      Change in appetite 0      Feeling bad or failure about yourself  0      Trouble concentrating 0      Moving slowly or fidgety/restless 0      Suicidal thoughts 0      PHQ-9 Score 3      Difficult doing work/chores Not difficult at all            02/13/2023   11:27 AM  Fall Risk   Falls in the past year? 1  Number falls in past yr: 1  Injury with Fall? 0  Risk for fall due to : No Fall Risks  Follow up Follow up appointment    Patient Care Team: Blane Ohara, MD as PCP - General (Family Medicine) Zettie Pho, [facility] (Inactive) (Pharmacist) Noel Christmas, MD as Consulting Physician (Urology) Marlowe Sax, RN as Case Manager (General  Practice)   Review of Systems  Constitutional:  Negative for chills, diaphoresis, fatigue and fever.  HENT:  Negative for congestion, ear pain and sore throat.   Respiratory:  Negative for cough and shortness of breath.   Cardiovascular:  Negative for chest pain and leg swelling.  Gastrointestinal:  Positive for diarrhea. Negative for abdominal pain, constipation, nausea and vomiting.  Genitourinary:  Negative for dysuria and urgency.  Musculoskeletal:  Negative for arthralgias and myalgias.  Neurological:  Negative for dizziness and headaches.  Psychiatric/Behavioral:  Negative for dysphoric mood.     Current Outpatient Medications on File Prior to Visit  Medication Sig Dispense Refill   Continuous Blood Gluc Sensor (FREESTYLE LIBRE 2 SENSOR) MISC Change sensor every 10 day as directed 3 each 5   ezetimibe (ZETIA) 10 MG tablet TAKE ONE (1) TABLET ONCE DAILY 90 tablet 1   FARXIGA 10 MG TABS tablet TAKE 1 TABLET BY MOUTH ONCE DAILY IN THE MORNING AS DIRECTED. 90 tablet 1   insulin aspart (FIASP FLEXTOUCH) 100 UNIT/ML FlexTouch Pen Inject 16 Units into the skin 2 (two)  times daily. With breakfast and supper (Patient not taking: Reported on 02/13/2023)     LANTUS SOLOSTAR 100 UNIT/ML Solostar Pen Inject 65 Units into the skin daily. Pt takes in the pm per wife (Patient not taking: Reported on 02/13/2023) 30 mL 1   lisinopril (ZESTRIL) 5 MG tablet Once daily in the am 90 tablet 0   Meclizine HCl 25 MG CHEW TAKE 1 TABLET THREE TIMES DAILY AS NEEDED FOR DIZZINESS 30 tablet 0   rosuvastatin (CRESTOR) 40 MG tablet Take 1 tablet by mouth dailly. 90 tablet 0   No current facility-administered medications on file prior to visit.   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   PONV (postoperative nausea and vomiting)    Prostate cancer (HCC) 07/2021   Past Surgical History:  Procedure Laterality Date   CYSTOSCOPY WITH URETHRAL DILATATION N/A 08/01/2021   Procedure: CYSTOSCOPY WITH BALLOON URETHRAL DILATATION;  Surgeon: Noel Christmas, MD;  Location: [facility];  Service: Urology;  Laterality: N/A;   heart stents     x 3   left cataract  10/2019   nose surgery for dog bite as a kid      right cataract Right 08/2019   TRANSURETHRAL RESECTION OF BLADDER TUMOR N/A 08/01/2021   Procedure: TRANSURETHRAL RESECTION OF PROSTATE;  Surgeon: Noel Christmas, MD;  Location: [facility];  Service: Urology;  Laterality: N/A;  90 MINS    Family History  Problem Relation Age of Onset   Endometrial cancer Mother    CAD Mother    AAA (abdominal aortic aneurysm) Mother    Healthy Father    Heart attack Brother    Social History   Socioeconomic History   Marital status: Married    Spouse name: Olegario Messier   Number of children: 1   Years of education: 12   Highest education level: High school graduate  Occupational History   Occupation: Retired  Tobacco Use   Smoking status: Former    Types: Cigarettes    Quit date: 2003    Years since quitting: 21.5   Smokeless tobacco: Never  Vaping Use   Vaping Use: Never used  Substance and Sexual Activity   Alcohol use: Not Currently    Comment: none since 2000   Drug use: Never   Sexual activity: Not on file  Other Topics Concern   Not on file  Social History Narrative   Lives at home with his wife.   2 cups caffeine per day.   Right-handed.   Social Determinants of Health   Financial Resource Strain: High Risk (10/25/2022)   Overall Financial Resource Strain (CARDIA)    Difficulty of Paying Living Expenses: Hard  Food Insecurity: No Food Insecurity (07/27/2022)   Hunger Vital Sign    Worried About Running Out of Food in the Last Year: Never  true    Ran Out of Food in the Last Year: Never true  Transportation Needs: No Transportation Needs (10/25/2022)   PRAPARE - Administrator, Civil Service (Medical): No    Lack of Transportation (Non-Medical): No  Physical Activity: Sufficiently Active (07/27/2022)   Exercise Vital Sign    Days  of Exercise per Week: 5 days    Minutes of Exercise per Session: 30 min  Stress: No Stress Concern Present (07/27/2022)   [employer] of Occupational Health - Occupational Stress Questionnaire    Feeling of Stress : Not at all  Social Connections: Moderately Integrated (07/27/2022)   Social Connection and Isolation Panel [NHANES]    Frequency of Communication with Friends and Family: More than three times a week    Frequency of Social Gatherings with Friends and Family: More than three times a week    Attends Religious Services: More than 4 times per year    Active Member of [employer] or Organizations: No    Attends Banker Meetings: Never    Marital Status: Married    Objective:  BP 112/62   Pulse 89   Temp 97.6 F (36.4 C)   Ht 5\' 9"  (1.753 m)   Wt 189 lb (85.7 kg)   SpO2 99%   BMI 27.91 kg/m      02/13/2023   11:17 AM 10/11/2022    7:49 AM 07/09/2022    2:21 PM  BP/Weight  Systolic BP 112 128 110  Diastolic BP 62 72 70  Wt. (Lbs) 189 201 206.8  BMI 27.91 kg/m2 29.68 kg/m2 30.54 kg/m2    Physical Exam Vitals reviewed.  Constitutional:      Appearance: Normal appearance. He is normal weight.  Cardiovascular:     Rate and Rhythm: Normal rate and regular rhythm.     Heart sounds: No murmur heard. Pulmonary:     Effort: Pulmonary effort is normal.     Breath sounds: Normal breath sounds.  Abdominal:     General: Abdomen is flat. Bowel sounds are normal.     Palpations: Abdomen is soft.     Tenderness: There is no abdominal tenderness.  Neurological:     Mental Status: He is alert and oriented to person, place, and time.  Psychiatric:         Mood and Affect: Mood normal.        Behavior: Behavior normal.     Diabetic Foot Exam - Simple   Simple Foot Form Diabetic Foot exam was performed with the following findings: Yes 02/13/2023 11:49 AM  Visual Inspection No deformities, no ulcerations, no other skin breakdown bilaterally: Yes Sensation Testing Intact to touch and monofilament testing bilaterally: Yes Pulse Check Posterior Tibialis and Dorsalis pulse intact bilaterally: Yes Comments      Lab Results  Component Value Date   WBC 7.7 02/11/2023   HGB 13.2 02/11/2023   HCT 39.3 02/11/2023   PLT 155 02/11/2023   GLUCOSE 107 (H) 02/11/2023   CHOL 138 02/11/2023   TRIG 77 02/11/2023   HDL 40 02/11/2023   LDLCALC 83 02/11/2023   ALT 14 02/11/2023   AST 16 02/11/2023   NA 139 02/11/2023   K 4.3 02/11/2023   CL 101 02/11/2023   CREATININE 0.73 (L) 02/11/2023   BUN 11 02/11/2023   CO2 24 02/11/2023   TSH 0.897 07/10/2022   HGBA1C 6.1 (H) 02/11/2023   MICROALBUR 150 08/18/2021      Assessment & Plan:    Controlled type 2 diabetes mellitus with diabetic polyneuropathy, with long-term current use of insulin (HCC) Assessment & Plan: Control: Well controlled Recommend check sugars fasting daily. Recommend check feet daily. Recommend annual eye exams. Medicines: Gabapentin 300 mg TID, ozempic and farxiga Continue to work on eating a healthy diet and exercise.    Orders: -  Ambulatory referral to Ophthalmology  Hypertensive heart disease without heart failure Assessment & Plan: Well controlled.  No changes to medicines.  Metoprolol 25 mg take 1 tablet daily, Lisinopril 5 mg  mg take 1 tablet daily Continue to work on eating a healthy diet and exercise.     Orders: -     Metoprolol Succinate ER; TAKE 1 TABLET BY MOUTH ONCE DAILY IN THE MORNING.  Dispense: 90 tablet; Refill: 1  Mixed hyperlipidemia Assessment & Plan: Well controlled.  No changes to medicines. Crestor 40 mg  take 1 tablet daily,  Zetia 10 mg take 1 tablet daily.  Continue to work on eating a healthy diet and exercise.     Atherosclerosis of native coronary artery of native heart without angina pectoris Assessment & Plan: Continue crestor 40 mg daily, Zetia 10 mg daily   Diabetic polyneuropathy associated with type 2 diabetes mellitus (HCC) Assessment & Plan: Control: Well controlled Recommend check sugars fasting daily. Recommend check feet daily. Recommend annual eye exams. Medicines: Farxiga 10 mg take 1 tablet daily, Ozempic 2 mg weekly. Continue to work on eating a healthy diet and exercise.     Orders: -     Gabapentin; Take 1 capsule (300 mg total) by mouth 3 (three) times daily.  Dispense: 270 capsule; Refill: 1  Other orders -     Ozempic (2 MG/DOSE); Inject 2 mg into the skin once a week.  Dispense: 9 mL; Refill: 0     Meds ordered this encounter  Medications   metoprolol succinate (TOPROL-XL) 25 MG 24 hr tablet    Sig: TAKE 1 TABLET BY MOUTH ONCE DAILY IN THE MORNING.    Dispense:  90 tablet    Refill:  1   Semaglutide, 2 MG/DOSE, (OZEMPIC, 2 MG/DOSE,) 8 MG/3ML SOPN    Sig: Inject 2 mg into the skin once a week.    Dispense:  9 mL    Refill:  0    Send to [employer] patient assistance   gabapentin (NEURONTIN) 300 MG capsule    Sig: Take 1 capsule (300 mg total) by mouth 3 (three) times daily.    Dispense:  270 capsule    Refill:  1    Orders Placed This Encounter  Procedures   Ambulatory referral to Ophthalmology     Follow-up: Return in about 3 months (around 05/16/2023) for chronic, fasting.   I,Katherina A Bramblett,acting as a scribe for Blane Ohara, MD.,have documented all relevant documentation on the behalf of Blane Ohara, MD,as directed by  Blane Ohara, MD while in the presence of Blane Ohara, MD.   Clayborn Bigness I Leal-Borjas,acting as a scribe for Blane Ohara, MD.,have documented all relevant documentation on the behalf of Blane Ohara, MD,as directed by  Blane Ohara, MD  while in the presence of Blane Ohara, MD.    An After Visit Summary was printed and given to the patient.  Blane Ohara, MD [facility] [phone]

## 2023-02-13 ENCOUNTER — Ambulatory Visit (INDEPENDENT_AMBULATORY_CARE_PROVIDER_SITE_OTHER): Payer: PPO | Admitting: Family Medicine

## 2023-02-13 VITALS — BP 112/62 | HR 89 | Temp 97.6°F | Ht 69.0 in | Wt 189.0 lb

## 2023-02-13 DIAGNOSIS — I119 Hypertensive heart disease without heart failure: Secondary | ICD-10-CM

## 2023-02-13 DIAGNOSIS — E1142 Type 2 diabetes mellitus with diabetic polyneuropathy: Secondary | ICD-10-CM

## 2023-02-13 DIAGNOSIS — E782 Mixed hyperlipidemia: Secondary | ICD-10-CM | POA: Diagnosis not present

## 2023-02-13 DIAGNOSIS — I2510 Atherosclerotic heart disease of native coronary artery without angina pectoris: Secondary | ICD-10-CM

## 2023-02-13 DIAGNOSIS — Z7984 Long term (current) use of oral hypoglycemic drugs: Secondary | ICD-10-CM | POA: Diagnosis not present

## 2023-02-13 DIAGNOSIS — Z7985 Long-term (current) use of injectable non-insulin antidiabetic drugs: Secondary | ICD-10-CM | POA: Diagnosis not present

## 2023-02-13 MED ORDER — GABAPENTIN 300 MG PO CAPS
300.0000 mg | ORAL_CAPSULE | Freq: Three times a day (TID) | ORAL | 1 refills | Status: DC
Start: 2023-02-13 — End: 2023-08-22

## 2023-02-13 MED ORDER — METOPROLOL SUCCINATE ER 25 MG PO TB24
ORAL_TABLET | ORAL | 1 refills | Status: DC
Start: 2023-02-13 — End: 2023-09-02

## 2023-02-13 NOTE — Assessment & Plan Note (Signed)
Well controlled.  No changes to medicines.  Metoprolol 25 mg take 1 tablet daily, Lisinopril 5 mg  mg take 1 tablet daily Continue to work on eating a healthy diet and exercise.

## 2023-02-13 NOTE — Assessment & Plan Note (Signed)
Well controlled.  No changes to medicines. Crestor 40 mg  take 1 tablet daily, Zetia 10 mg take 1 tablet daily.  Continue to work on eating a healthy diet and exercise.

## 2023-02-13 NOTE — Assessment & Plan Note (Signed)
Control: Well controlled Recommend check sugars fasting daily. Recommend check feet daily. Recommend annual eye exams. Medicines: Farxiga 10 mg take 1 tablet daily, Ozempic 2 mg weekly. Continue to work on eating a healthy diet and exercise.

## 2023-02-13 NOTE — Patient Instructions (Signed)
Eat more fiber --Vegetable, meat, fruit

## 2023-02-13 NOTE — Assessment & Plan Note (Signed)
Continue crestor 40 mg daily, Zetia 10 mg daily 

## 2023-02-15 DIAGNOSIS — E1142 Type 2 diabetes mellitus with diabetic polyneuropathy: Secondary | ICD-10-CM | POA: Insufficient documentation

## 2023-02-15 NOTE — Assessment & Plan Note (Addendum)
>>  ASSESSMENT AND PLAN FOR CONTROLLED TYPE 2 DIABETES MELLITUS WITH DIABETIC POLYNEUROPATHY, WITH LONG-TERM CURRENT USE OF INSULIN (HCC) WRITTEN ON 02/15/2023  8:45 AM BY LEAL-BORJAS, Patrici Minnis I, CMA  Control: Well controlled Recommend check sugars fasting daily. Recommend check feet daily. Recommend annual eye exams. Medicines: Gabapentin 300 mg TID, ozempic and farxiga Continue to work on eating a healthy diet and exercise.    >>ASSESSMENT AND PLAN FOR DIABETIC POLYNEUROPATHY (HCC) WRITTEN ON 02/13/2023 11:54 AM BY BRAMBLETT, KATHERINA A, CMA  Control: Well controlled Recommend check sugars fasting daily. Recommend check feet daily. Recommend annual eye exams. Medicines: Farxiga 10 mg take 1 tablet daily, Ozempic 2 mg weekly. Continue to work on eating a healthy diet and exercise.

## 2023-02-17 ENCOUNTER — Encounter: Payer: Self-pay | Admitting: Family Medicine

## 2023-02-17 MED ORDER — OZEMPIC (2 MG/DOSE) 8 MG/3ML ~~LOC~~ SOPN: 2.0000 mg | PEN_INJECTOR | SUBCUTANEOUS | 0 refills | Status: DC

## 2023-02-22 ENCOUNTER — Telehealth: Payer: Self-pay

## 2023-02-22 NOTE — Telephone Encounter (Signed)
Patient's wife was notified that we received Patient assistant (pen needles).

## 2023-02-26 ENCOUNTER — Ambulatory Visit: Payer: PPO | Admitting: Podiatry

## 2023-02-26 DIAGNOSIS — B351 Tinea unguium: Secondary | ICD-10-CM

## 2023-02-26 DIAGNOSIS — M79675 Pain in left toe(s): Secondary | ICD-10-CM | POA: Diagnosis not present

## 2023-02-26 DIAGNOSIS — M79674 Pain in right toe(s): Secondary | ICD-10-CM

## 2023-02-26 DIAGNOSIS — E1142 Type 2 diabetes mellitus with diabetic polyneuropathy: Secondary | ICD-10-CM

## 2023-02-26 NOTE — Progress Notes (Signed)
  Subjective:  Patient ID: Joshua Nelson, male    DOB: [birth year],  MRN: [id number]  Chief Complaint  Patient presents with   Nail Problem    Diabetic Foot Care- nail trim     72 y.o. male presents with the above complaint. History confirmed with patient. Patient presenting with pain related to dystrophic thickened elongated nails. Patient is unable to trim own nails related to nail dystrophy and/or mobility issues. Patient does have a history of T2DM with peripheral neuropathy.  Objective:  Physical Exam: warm, good capillary refill, DP and PT pulses 1/4 bilateral nail exam onychomycosis of the toenails, onycholysis, and dystrophic nails DP pulses palpable, PT pulses palpable, and protective sensation absent Left Foot:  Pain with palpation of nails due to elongation and dystrophic growth.  Right Foot: Pain with palpation of nails due to elongation and dystrophic growth.   Assessment:   1. Pain due to onychomycosis of toenails of both feet   2. DM type 2 with diabetic peripheral neuropathy (HCC)     Plan:  Patient was evaluated and treated and all questions answered.  #Onychomycosis with pain  -Nails palliatively debrided as below. -Educated on self-care  Procedure: Nail Debridement Rationale: Pain Type of Debridement: manual, sharp debridement. Instrumentation: Nail nipper, rotary burr. Number of Nails: 10  Return in about 3 months (around 05/29/2023) for [facility].         Corinna Gab, DPM [facility]

## 2023-03-06 ENCOUNTER — Ambulatory Visit (INDEPENDENT_AMBULATORY_CARE_PROVIDER_SITE_OTHER): Payer: PPO

## 2023-03-06 ENCOUNTER — Telehealth: Payer: Self-pay

## 2023-03-06 ENCOUNTER — Telehealth: Payer: HMO

## 2023-03-06 DIAGNOSIS — E1142 Type 2 diabetes mellitus with diabetic polyneuropathy: Secondary | ICD-10-CM

## 2023-03-06 DIAGNOSIS — I119 Hypertensive heart disease without heart failure: Secondary | ICD-10-CM

## 2023-03-06 NOTE — Telephone Encounter (Signed)
   CCM RN Visit Note   03-06-2023 Name: [name] MRN: [id number]      DOB: 1950/09/10  Subjective: Joshua Nelson is a 72 y.o. year old male who is a primary care patient of Dr. Blane Ohara. The patient was referred to the Chronic Care Management team for assistance with care management needs subsequent to provider initiation of CCM services and plan of care.      An unsuccessful telephone outreach was attempted today to contact the patient about Chronic Care Management needs.    Plan:A HIPAA compliant phone message was left for the patient providing contact information and requesting a return call.  Alto Denver RN, MSN, CCM RN Care Manager  Chronic Care Management Direct Number: [phone]

## 2023-03-06 NOTE — Chronic Care Management (AMB) (Signed)
Chronic Care Management   CCM RN Visit Note  03/06/2023 Name: Joshua Nelson MRN: [id number] DOB: [birth year]  Subjective: Joshua Nelson is a 72 y.o. year old male who is a primary care patient of Cox, Kirsten, MD. The patient was referred to the Chronic Care Management team for assistance with care management needs subsequent to provider initiation of CCM services and plan of care.    Today's Visit:  Engaged with patient by telephone for follow up visit.     SDOH Interventions Today    Flowsheet Row Most Recent Value  SDOH Interventions   Health Literacy Interventions Intervention Not Indicated, Other (Comment)  [wife assist with medical needs]         Goals Addressed             This Visit's Progress    CCM Expected Outcome:  Monitor, Self-Manage and Reduce Symptoms of Diabetes       Current Barriers:  Knowledge Deficits related to the need to control DM and keep A1C level down to prevent issues with body systems Care Coordination needs related to financial cost of DM medication, working with pharm D in a patient with DM Chronic Disease Management support and education needs related to effective management of DM Financial Constraints.  Lab Results  Component Value Date   HGBA1C 6.1 (H) 02/11/2023    Previous 07-10-2022 8.2 Planned Interventions: Provided education to patient about basic DM disease process. The patient saw the pcp in early part of July and A1C is at goal. Spoke to his wife today and the patient is maintaining his blood sugars and denies any acute changes. Education and support given.  Reviewed medications with patient and discussed importance of medication adherence. The patient is working with the pharm D on medication cost contraints. The patient has his medications. Likes the [name] and says it is working well for him. He denies any new needs related to medications at this time.  Review of pharm D changes and the availability of pharmacy support. The  patient wife will call with any new needs before the next outreach if needed.   Reviewed prescribed diet with patient heart healthy/ADA. Education and support given. The patient denies any issues with following a heart healthy/ADA diet. The patient is having normalized readings. Denies any acute changes. Will continue to monitor for changes or new needs; Counseled on importance of regular laboratory monitoring as prescribed. Has regular lab work. A1C is at goal.  Discussed plans with patient for ongoing care management follow up and provided patient with direct contact information for care management team;      Provided patient with written educational materials related to hypo and hyperglycemia and importance of correct treatment. Education and support given. Denies any acute highs or low. He does have sx and sx of hypoglycemia when it gets below 100. Education and support provided.       Reviewed scheduled/upcoming provider appointments including: 05-09-2023 at 920 am;         Advised patient, providing education and rationale, to check cbg when you have symptoms of low or high blood sugar and continuously, the patient has a freestyle [place]  and record. The patient continue to use the freestyle libre and it has alarms for lows and highs. His average has been 60 to 170.  He says his blood sugars have been stable. Review of goal of fasting <130 and post prandial of <180       call provider for findings  outside established parameters;       Referral made to pharmacy team for assistance with medication cost and pap forms for assistance with medications for management of DM. Review of pharm D support availability. The patient gets assistance with Ozempic;       Review of patient status, including review of consultants reports, relevant laboratory and other test results, and medications completed;       Advised patient to discuss changes in DM, questions or concerns with provider;      Screening for signs and  symptoms of depression related to chronic disease state;        Assessed social determinant of health barriers;       Last foot exam by provider was May 2024 with podiatry. States he checks his feet daily for any changes in his foot care. Last eye exam 03-2021. The patients wife states they are working on getting an appointment for eye exam. Reminder provided today for eye exam. The patient states he still has not been able to get an eye exam secured but he is planning on calling them himself. He said he was told they were behind.   Symptom Management: Take medications as prescribed   Attend all scheduled provider appointments Call provider office for new concerns or questions  call the Suicide and Crisis Lifeline: 988 call the [place] National Suicide Prevention Lifeline: 406-078-5310 or TTY: (204)386-8140 TTY (559)219-6578) to talk to a trained counselor call 1-800-273-TALK (toll free, 24 hour hotline) if experiencing a Mental Health or Behavioral Health Crisis  check feet daily for cuts, sores or redness trim toenails straight across manage portion size wash and dry feet carefully every day wear comfortable, cotton socks wear comfortable, well-fitting shoes  Follow Up Plan: Telephone follow up appointment with care management team member scheduled for: 05-29-2023 at 0900 am       CCM Expected Outcome:  Monitor, Self-Manage, and Reduce Symptoms of Hypertension       Current Barriers:  Chronic Disease Management support and education needs related to effective management of HTN BP Readings from Last 3 Encounters:  02/13/23 112/62  10/11/22 128/72  07/09/22 110/70     Planned Interventions: Evaluation of current treatment plan related to hypertension self management and patient's adherence to plan as established by provider. The patient has stable blood pressures. The patient denies any new issues related to HTN or heart health, saw the pcp recently and blood pressures are controlled at  this time. Will continue to monitor.    Provided education to patient re: stroke prevention, s/s of heart attack and stroke; Reviewed prescribed diet heart healthy/ADA diet. The patient is compliant with heart healthy/ADA diet. Blood sugars are a little elevated on Sundays due to decreased activity and changes in eating patterns. Reviewed medications with patient and discussed importance of compliance. The patient is compliant with medications. Works with pharm D for education and ongoing support. Review of pharm D changes and if the patient needs pharmacy support to call the [facility] before next outreach. The patient wife verbalized understanding.   Discussed plans with patient for ongoing care management follow up and provided patient with direct contact information for care management team; Advised patient, providing education and rationale, to monitor blood pressure daily and record, calling PCP for findings outside established parameters;  Reviewed scheduled/upcoming provider appointments including: 05-09-2023 at 920 am Advised patient to discuss changes in HTN or heart health with provider; Provided education on prescribed diet heart healthy/ADA diet ;  Discussed  complications of poorly controlled blood pressure such as heart disease, stroke, circulatory complications, vision complications, kidney impairment, sexual dysfunction;  Screening for signs and symptoms of depression related to chronic disease state;  Assessed social determinant of health barriers;   Symptom Management: Take medications as prescribed   Attend all scheduled provider appointments Call provider office for new concerns or questions  call the Suicide and Crisis Lifeline: 988 call the [place] National Suicide Prevention Lifeline: (607)268-1891 or TTY: (469)302-2443 TTY 909-343-3718) to talk to a trained counselor call 1-800-273-TALK (toll free, 24 hour hotline) if experiencing a Mental Health or Behavioral Health Crisis  check  blood pressure weekly learn about high blood pressure call doctor for signs and symptoms of high blood pressure develop an action plan for high blood pressure keep all doctor appointments take medications for blood pressure exactly as prescribed report new symptoms to your doctor  Follow Up Plan: Telephone follow up appointment with care management team member scheduled for: 05-29-2023 at 0900 am          Plan:Telephone follow up appointment with care management team member scheduled for:  05-29-2023 at 0900 am  Alto Denver RN, MSN, CCM RN Care Manager  Chronic Care Management Direct Number: [phone]

## 2023-03-06 NOTE — Patient Instructions (Signed)
Please call the care guide team at [phone] if you need to cancel or reschedule your appointment.   If you are experiencing a Mental Health or Behavioral Health Crisis or need someone to talk to, please call the Suicide and Crisis Lifeline: 988 call the [place] National Suicide Prevention Lifeline: (320) 186-6515 or TTY: (270)419-5675 TTY (304)559-2939) to talk to a trained counselor call 1-800-273-TALK (toll free, 24 hour hotline) go to [facility] [address] [place] [phone])   Following is a copy of the CCM Program Consent:  CCM service includes personalized support from designated clinical staff supervised by the physician, including individualized plan of care and coordination with other care providers 24/7 contact phone numbers for assistance for urgent and routine care needs. Service will only be billed when office clinical staff spend 20 minutes or more in a month to coordinate care. Only one practitioner may furnish and bill the service in a calendar month. The patient may stop CCM services at amy time (effective at the end of the month) by phone call to the office staff. The patient will be responsible for cost sharing (co-pay) or up to 20% of the service fee (after annual deductible is met)  Following is a copy of your full provider care plan:   Goals Addressed             This Visit's Progress    CCM Expected Outcome:  Monitor, Self-Manage and Reduce Symptoms of Diabetes       Current Barriers:  Knowledge Deficits related to the need to control DM and keep A1C level down to prevent issues with body systems Care Coordination needs related to financial cost of DM medication, working with pharm D in a patient with DM Chronic Disease Management support and education needs related to effective management of DM Financial Constraints.  Lab Results  Component Value Date   HGBA1C 6.1 (H) 02/11/2023    Previous 07-10-2022  8.2 Planned Interventions: Provided education to patient about basic DM disease process. The patient saw the pcp in early part of July and A1C is at goal. Spoke to his wife today and the patient is maintaining his blood sugars and denies any acute changes. Education and support given.  Reviewed medications with patient and discussed importance of medication adherence. The patient is working with the pharm D on medication cost contraints. The patient has his medications. Likes the [name] and says it is working well for him. He denies any new needs related to medications at this time.  Review of pharm D changes and the availability of pharmacy support. The patient wife will call with any new needs before the next outreach if needed.   Reviewed prescribed diet with patient heart healthy/ADA. Education and support given. The patient denies any issues with following a heart healthy/ADA diet. The patient is having normalized readings. Denies any acute changes. Will continue to monitor for changes or new needs; Counseled on importance of regular laboratory monitoring as prescribed. Has regular lab work. A1C is at goal.  Discussed plans with patient for ongoing care management follow up and provided patient with direct contact information for care management team;      Provided patient with written educational materials related to hypo and hyperglycemia and importance of correct treatment. Education and support given. Denies any acute highs or low. He does have sx and sx of hypoglycemia when it gets below 100. Education and support provided.       Reviewed scheduled/upcoming provider  appointments including: 05-09-2023 at 920 am;         Advised patient, providing education and rationale, to check cbg when you have symptoms of low or high blood sugar and continuously, the patient has a freestyle [place]  and record. The patient continue to use the freestyle libre and it has alarms for lows and highs. His average has  been 60 to 170.  He says his blood sugars have been stable. Review of goal of fasting <130 and post prandial of <180       call provider for findings outside established parameters;       Referral made to pharmacy team for assistance with medication cost and pap forms for assistance with medications for management of DM. Review of pharm D support availability. The patient gets assistance with Ozempic;       Review of patient status, including review of consultants reports, relevant laboratory and other test results, and medications completed;       Advised patient to discuss changes in DM, questions or concerns with provider;      Screening for signs and symptoms of depression related to chronic disease state;        Assessed social determinant of health barriers;       Last foot exam by provider was May 2024 with podiatry. States he checks his feet daily for any changes in his foot care. Last eye exam 03-2021. The patients wife states they are working on getting an appointment for eye exam. Reminder provided today for eye exam. The patient states he still has not been able to get an eye exam secured but he is planning on calling them himself. He said he was told they were behind.   Symptom Management: Take medications as prescribed   Attend all scheduled provider appointments Call provider office for new concerns or questions  call the Suicide and Crisis Lifeline: 988 call the [place] National Suicide Prevention Lifeline: 215-326-7652 or TTY: 7347863303 TTY 929-742-6070) to talk to a trained counselor call 1-800-273-TALK (toll free, 24 hour hotline) if experiencing a Mental Health or Behavioral Health Crisis  check feet daily for cuts, sores or redness trim toenails straight across manage portion size wash and dry feet carefully every day wear comfortable, cotton socks wear comfortable, well-fitting shoes  Follow Up Plan: Telephone follow up appointment with care management team member  scheduled for: 05-29-2023 at 0900 am       CCM Expected Outcome:  Monitor, Self-Manage, and Reduce Symptoms of Hypertension       Current Barriers:  Chronic Disease Management support and education needs related to effective management of HTN BP Readings from Last 3 Encounters:  02/13/23 112/62  10/11/22 128/72  07/09/22 110/70     Planned Interventions: Evaluation of current treatment plan related to hypertension self management and patient's adherence to plan as established by provider. The patient has stable blood pressures. The patient denies any new issues related to HTN or heart health, saw the pcp recently and blood pressures are controlled at this time. Will continue to monitor.    Provided education to patient re: stroke prevention, s/s of heart attack and stroke; Reviewed prescribed diet heart healthy/ADA diet. The patient is compliant with heart healthy/ADA diet. Blood sugars are a little elevated on Sundays due to decreased activity and changes in eating patterns. Reviewed medications with patient and discussed importance of compliance. The patient is compliant with medications. Works with pharm D for education and ongoing support. Review of pharm  D changes and if the patient needs pharmacy support to call the [facility] before next outreach. The patient wife verbalized understanding.   Discussed plans with patient for ongoing care management follow up and provided patient with direct contact information for care management team; Advised patient, providing education and rationale, to monitor blood pressure daily and record, calling PCP for findings outside established parameters;  Reviewed scheduled/upcoming provider appointments including: 05-09-2023 at 920 am Advised patient to discuss changes in HTN or heart health with provider; Provided education on prescribed diet heart healthy/ADA diet ;  Discussed complications of poorly controlled blood pressure such as heart disease, stroke,  circulatory complications, vision complications, kidney impairment, sexual dysfunction;  Screening for signs and symptoms of depression related to chronic disease state;  Assessed social determinant of health barriers;   Symptom Management: Take medications as prescribed   Attend all scheduled provider appointments Call provider office for new concerns or questions  call the Suicide and Crisis Lifeline: 988 call the [place] National Suicide Prevention Lifeline: 737-554-6676 or TTY: 445-758-1805 TTY 418-791-6897) to talk to a trained counselor call 1-800-273-TALK (toll free, 24 hour hotline) if experiencing a Mental Health or Behavioral Health Crisis  check blood pressure weekly learn about high blood pressure call doctor for signs and symptoms of high blood pressure develop an action plan for high blood pressure keep all doctor appointments take medications for blood pressure exactly as prescribed report new symptoms to your doctor  Follow Up Plan: Telephone follow up appointment with care management team member scheduled for: 05-29-2023 at 0900 am          The patient verbalized understanding of instructions, educational materials, and care plan provided today and DECLINED offer to receive copy of patient instructions, educational materials, and care plan.  Telephone follow up appointment with care management team member scheduled for: 05-29-2023 at 0900 am

## 2023-03-06 NOTE — Telephone Encounter (Signed)
Pt picked up patient assistant needles  - blue forms signed and left in kim smith, lpn box

## 2023-03-12 ENCOUNTER — Other Ambulatory Visit: Payer: Self-pay | Admitting: Family Medicine

## 2023-03-12 ENCOUNTER — Telehealth: Payer: Self-pay | Admitting: Pharmacist

## 2023-03-12 MED ORDER — ROSUVASTATIN CALCIUM 40 MG PO TABS: 40.0000 mg | ORAL_TABLET | Freq: Every day | ORAL | 1 refills | Status: DC

## 2023-03-12 NOTE — Telephone Encounter (Signed)
Sent to [facility] [place].

## 2023-03-12 NOTE — Progress Notes (Signed)
Pharmacy Quality Measure Review  This patient is appearing on a report for being at risk of failing the adherence measure for cholesterol (statin) medications this calendar year.   Medication: rosuvastatin 40 mg  Last fill date: 12/06/22 for 90 day supply  Last refill sent for 90 day supply with 0 refills. Please send refill for rosuvastatin to [facility].   Thanks!  Catie Eppie Gibson, PharmD, BCACP, CPP Clinical Pharmacist [facility] [phone]

## 2023-03-13 DIAGNOSIS — Z794 Long term (current) use of insulin: Secondary | ICD-10-CM

## 2023-03-13 DIAGNOSIS — I10 Essential (primary) hypertension: Secondary | ICD-10-CM | POA: Diagnosis not present

## 2023-03-13 DIAGNOSIS — E1159 Type 2 diabetes mellitus with other circulatory complications: Secondary | ICD-10-CM

## 2023-03-19 ENCOUNTER — Telehealth: Payer: Self-pay

## 2023-03-19 NOTE — Telephone Encounter (Signed)
Pts wife made aware that Ozempic patient assistance is here ready to be picked up

## 2023-03-20 NOTE — Telephone Encounter (Signed)
Pt picked up patience assistance flextouch -  ozempic blue form signed and left in kim smith, lpn box

## 2023-03-21 ENCOUNTER — Ambulatory Visit: Payer: PPO

## 2023-03-21 VITALS — Ht 69.0 in | Wt 189.0 lb

## 2023-03-21 DIAGNOSIS — Z0000 Encounter for general adult medical examination without abnormal findings: Secondary | ICD-10-CM

## 2023-03-21 NOTE — Patient Instructions (Addendum)
Joshua Nelson , Thank you for taking time to come for your Medicare Wellness Visit. I appreciate your ongoing commitment to your health goals. Please review the following plan we discussed and let me know if I can assist you in the future.   Referrals/Orders/Follow-Ups/Clinician Recommendations:   This is a list of the screening recommended for you and due dates:  Health Maintenance  Topic Date Due   DTaP/Tdap/Td vaccine (1 - Tdap) Never done   Zoster (Shingles) Vaccine (1 of 2) Never done   Colon Cancer Screening  Never done   COVID-19 Vaccine (3 - Pfizer risk series) 02/05/2020   Eye exam for diabetics  09/24/2020   Flu Shot  03/14/2023   Hemoglobin A1C  08/14/2023   Yearly kidney health urinalysis for diabetes  10/11/2023   Yearly kidney function blood test for diabetes  02/11/2024   Complete foot exam   02/13/2024   Medicare Annual Wellness Visit  03/20/2024   Pneumonia Vaccine  Completed   Hepatitis C Screening  Completed   HPV Vaccine  Aged Out    Advanced directives: (Declined) Advance directive discussed with you today. Even though you declined this today, please call our office should you change your mind, and we can give you the proper paperwork for you to fill out.  Next Medicare Annual Wellness Visit scheduled for next year: Yes  Preventive Care 75 Years and Older, Male  Preventive care refers to lifestyle choices and visits with your health care provider that can promote health and wellness. What does preventive care include? A yearly physical exam. This is also called an annual well check. Dental exams once or twice a year. Routine eye exams. Ask your health care provider how often you should have your eyes checked. Personal lifestyle choices, including: Daily care of your teeth and gums. Regular physical activity. Eating a healthy diet. Avoiding tobacco and drug use. Limiting alcohol use. Practicing safe sex. Taking low doses of aspirin every day. Taking vitamin  and mineral supplements as recommended by your health care provider. What happens during an annual well check? The services and screenings done by your health care provider during your annual well check will depend on your age, overall health, lifestyle risk factors, and family history of disease. Counseling  Your health care provider may ask you questions about your: Alcohol use. Tobacco use. Drug use. Emotional well-being. Home and relationship well-being. Sexual activity. Eating habits. History of falls. Memory and ability to understand (cognition). Work and work Astronomer. Screening  You may have the following tests or measurements: Height, weight, and BMI. Blood pressure. Lipid and cholesterol levels. These may be checked every 5 years, or more frequently if you are over 70 years old. Skin check. Lung cancer screening. You may have this screening every year starting at age 53 if you have a 30-pack-year history of smoking and currently smoke or have quit within the past 15 years. Fecal occult blood test (FOBT) of the stool. You may have this test every year starting at age 19. Flexible sigmoidoscopy or colonoscopy. You may have a sigmoidoscopy every 5 years or a colonoscopy every 10 years starting at age 14. Prostate cancer screening. Recommendations will vary depending on your family history and other risks. Hepatitis C blood test. Hepatitis B blood test. Sexually transmitted disease (STD) testing. Diabetes screening. This is done by checking your blood sugar (glucose) after you have not eaten for a while (fasting). You may have this done every 1-3 years. Abdominal aortic aneurysm (  AAA) screening. You may need this if you are a current or former smoker. Osteoporosis. You may be screened starting at age 13 if you are at high risk. Talk with your health care provider about your test results, treatment options, and if necessary, the need for more tests. Vaccines  Your health care  provider may recommend certain vaccines, such as: Influenza vaccine. This is recommended every year. Tetanus, diphtheria, and acellular pertussis (Tdap, Td) vaccine. You may need a Td booster every 10 years. Zoster vaccine. You may need this after age 38. Pneumococcal 13-valent conjugate (PCV13) vaccine. One dose is recommended after age 7. Pneumococcal polysaccharide (PPSV23) vaccine. One dose is recommended after age 58. Talk to your health care provider about which screenings and vaccines you need and how often you need them. This information is not intended to replace advice given to you by your health care provider. Make sure you discuss any questions you have with your health care provider. Document Released: 08/26/2015 Document Revised: 04/18/2016 Document Reviewed: 05/31/2015 Elsevier Interactive Patient Education  2017 [employer].  Fall Prevention in the Home Falls can cause injuries. They can happen to people of all ages. There are many things you can do to make your home safe and to help prevent falls. What can I do on the outside of my home? Regularly fix the edges of walkways and driveways and fix any cracks. Remove anything that might make you trip as you walk through a door, such as a raised step or threshold. Trim any bushes or trees on the path to your home. Use bright outdoor lighting. Clear any walking paths of anything that might make someone trip, such as rocks or tools. Regularly check to see if handrails are loose or broken. Make sure that both sides of any steps have handrails. Any raised decks and porches should have guardrails on the edges. Have any leaves, snow, or ice cleared regularly. Use sand or salt on walking paths during winter. Clean up any spills in your garage right away. This includes oil or grease spills. What can I do in the bathroom? Use night lights. Install grab bars by the toilet and in the tub and shower. Do not use towel bars as grab  bars. Use non-skid mats or decals in the tub or shower. If you need to sit down in the shower, use a plastic, non-slip stool. Keep the floor dry. Clean up any water that spills on the floor as soon as it happens. Remove soap buildup in the tub or shower regularly. Attach bath mats securely with double-sided non-slip rug tape. Do not have throw rugs and other things on the floor that can make you trip. What can I do in the bedroom? Use night lights. Make sure that you have a light by your bed that is easy to reach. Do not use any sheets or blankets that are too big for your bed. They should not hang down onto the floor. Have a firm chair that has side arms. You can use this for support while you get dressed. Do not have throw rugs and other things on the floor that can make you trip. What can I do in the kitchen? Clean up any spills right away. Avoid walking on wet floors. Keep items that you use a lot in easy-to-reach places. If you need to reach something above you, use a strong step stool that has a grab bar. Keep electrical cords out of the way. Do not use floor polish  or wax that makes floors slippery. If you must use wax, use non-skid floor wax. Do not have throw rugs and other things on the floor that can make you trip. What can I do with my stairs? Do not leave any items on the stairs. Make sure that there are handrails on both sides of the stairs and use them. Fix handrails that are broken or loose. Make sure that handrails are as long as the stairways. Check any carpeting to make sure that it is firmly attached to the stairs. Fix any carpet that is loose or worn. Avoid having throw rugs at the top or bottom of the stairs. If you do have throw rugs, attach them to the floor with carpet tape. Make sure that you have a light switch at the top of the stairs and the bottom of the stairs. If you do not have them, ask someone to add them for you. What else can I do to help prevent  falls? Wear shoes that: Do not have high heels. Have rubber bottoms. Are comfortable and fit you well. Are closed at the toe. Do not wear sandals. If you use a stepladder: Make sure that it is fully opened. Do not climb a closed stepladder. Make sure that both sides of the stepladder are locked into place. Ask someone to hold it for you, if possible. Clearly mark and make sure that you can see: Any grab bars or handrails. First and last steps. Where the edge of each step is. Use tools that help you move around (mobility aids) if they are needed. These include: Canes. Walkers. Scooters. Crutches. Turn on the lights when you go into a dark area. Replace any light bulbs as soon as they burn out. Set up your furniture so you have a clear path. Avoid moving your furniture around. If any of your floors are uneven, fix them. If there are any pets around you, be aware of where they are. Review your medicines with your doctor. Some medicines can make you feel dizzy. This can increase your chance of falling. Ask your doctor what other things that you can do to help prevent falls. This information is not intended to replace advice given to you by your health care provider. Make sure you discuss any questions you have with your health care provider. Document Released: 05/26/2009 Document Revised: 01/05/2016 Document Reviewed: 09/03/2014 Elsevier Interactive Patient Education  2017 [employer].

## 2023-03-21 NOTE — Progress Notes (Signed)
Subjective:   Joshua Nelson is a 72 y.o. male who presents for Medicare Annual/Subsequent preventive examination.  Visit Complete: Virtual  I connected with  Darylene Price on 03/21/23 by a audio enabled telemedicine application and verified that I am speaking with the correct person using two identifiers.  Patient Location: Home  Provider Location: Home Office  I discussed the limitations of evaluation and management by telemedicine. The patient expressed understanding and agreed to proceed.  Patient Medicare AWV questionnaire was completed by the patient on  ; I have confirmed that all information answered by patient is correct and no changes since this date.  Review of Systems    Vital Signs: Unable to obtain new vitals due to this being a telehealth visit.  Cardiac Risk Factors include: advanced age (>65men, >33 women);diabetes mellitus;male gender;hypertension     Objective:    Today's Vitals   03/21/23 0840 03/21/23 0842  Weight: 189 lb (85.7 kg)   Height: 5\' 9"  (1.753 m)   PainSc:  0-No pain   Body mass index is 27.91 kg/m.     03/21/2023    8:51 AM 12/21/2021   12:12 PM 08/01/2021    4:41 PM 07/26/2021   11:19 AM 07/12/2020    2:59 PM 12/07/2014    5:10 PM 12/03/2014   12:05 PM  Advanced Directives  Does Patient Have a Medical Advance Directive? No No No No No Yes No  Type of Advance Directive      Healthcare Power of Attorney   Would patient like information on creating a medical advance directive? No - Patient declined Yes (MAU/Ambulatory/Procedural Areas - Information given) No - Patient declined   No - patient declined information No - patient declined information    Current Medications (verified) Outpatient Encounter Medications as of 03/21/2023  Medication Sig   Continuous Blood Gluc Sensor (FREESTYLE LIBRE 2 SENSOR) MISC Change sensor every 10 day as directed   ezetimibe (ZETIA) 10 MG tablet TAKE ONE (1) TABLET ONCE DAILY   FARXIGA 10 MG TABS tablet  TAKE 1 TABLET BY MOUTH ONCE DAILY IN THE MORNING AS DIRECTED.   gabapentin (NEURONTIN) 300 MG capsule Take 1 capsule (300 mg total) by mouth 3 (three) times daily.   insulin aspart (FIASP FLEXTOUCH) 100 UNIT/ML FlexTouch Pen Inject 16 Units into the skin 2 (two) times daily. With breakfast and supper (Patient not taking: Reported on 02/13/2023)   LANTUS SOLOSTAR 100 UNIT/ML Solostar Pen Inject 65 Units into the skin daily. Pt takes in the pm per wife (Patient not taking: Reported on 02/13/2023)   lisinopril (ZESTRIL) 5 MG tablet Once daily in the am   Meclizine HCl 25 MG CHEW TAKE 1 TABLET THREE TIMES DAILY AS NEEDED FOR DIZZINESS   metoprolol succinate (TOPROL-XL) 25 MG 24 hr tablet TAKE 1 TABLET BY MOUTH ONCE DAILY IN THE MORNING.   rosuvastatin (CRESTOR) 40 MG tablet Take 1 tablet (40 mg total) by mouth at bedtime.   Semaglutide, 2 MG/DOSE, (OZEMPIC, 2 MG/DOSE,) 8 MG/3ML SOPN Inject 2 mg into the skin once a week.   No facility-administered encounter medications on file as of 03/21/2023.    Allergies (verified) Patient has no known allergies.   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   PONV (postoperative nausea and vomiting)    Prostate cancer (HCC) 07/2021   Past Surgical History:  Procedure Laterality Date   CYSTOSCOPY WITH URETHRAL DILATATION N/A 08/01/2021   Procedure: CYSTOSCOPY WITH BALLOON URETHRAL DILATATION;  Surgeon: Noel Christmas, MD;  Location: [facility];  Service: Urology;  Laterality: N/A;   heart stents     x 3   left cataract  10/2019   nose surgery for dog bite as a kid      right cataract Right 08/2019   TRANSURETHRAL RESECTION OF BLADDER TUMOR N/A  08/01/2021   Procedure: TRANSURETHRAL RESECTION OF PROSTATE;  Surgeon: Noel Christmas, MD;  Location: [facility];  Service: Urology;  Laterality: N/A;  90 MINS   Family History  Problem Relation Age of Onset   Endometrial cancer Mother    CAD Mother    AAA (abdominal aortic aneurysm) Mother    Healthy Father    Heart attack Brother    Social History   Socioeconomic History   Marital status: Married    Spouse name: Olegario Messier   Number of children: 1   Years of education: 12   Highest education level: High school graduate  Occupational History   Occupation: Retired  Tobacco Use   Smoking status: Former    Current packs/day: 0.00    Types: Cigarettes    Quit date: 2003    Years since quitting: 21.6   Smokeless tobacco: Never  Vaping Use   Vaping status: Never Used  Substance and Sexual Activity   Alcohol use: Not Currently    Comment: none since 2000   Drug use: Never   Sexual activity: Not on file  Other Topics Concern   Not on file  Social History Narrative   Lives at home with his wife.   2 cups caffeine per day.   Right-handed.   Social Determinants of Health   Financial Resource Strain: Low Risk  (03/21/2023)   Overall Financial Resource Strain (CARDIA)    Difficulty of Paying Living Expenses: Not hard at all  Food Insecurity: No Food Insecurity (03/21/2023)   Hunger Vital Sign    Worried About Running Out of Food in the Last Year: Never true    Ran Out of Food in the Last Year: Never true  Transportation Needs: No Transportation Needs (03/21/2023)   PRAPARE - Administrator, Civil Service (Medical): No    Lack of Transportation (Non-Medical): No  Physical Activity: Insufficiently Active (03/21/2023)   Exercise Vital Sign    Days of Exercise per Week: 7 days    Minutes of Exercise per Session: 20 min  Stress: No Stress Concern Present (03/21/2023)   [employer] of Occupational Health - Occupational Stress Questionnaire    Feeling of Stress : Not at all   Social Connections: Socially Integrated (03/21/2023)   Social Connection and Isolation Panel [NHANES]    Frequency of Communication with Friends and Family: More than three times a week    Frequency of Social Gatherings with Friends and Family: More than three times a week    Attends Religious Services: More than 4 times per year    Active Member of [employer] or Organizations: Yes    Attends Engineer, structural: More than 4 times per year    Marital Status: Married    Tobacco Counseling Counseling given: Not Answered   Clinical Intake:  Consulting civil engineer  completed: No  Pain : No/denies pain Pain Score: 0-No pain     BMI - recorded: 27.91 Nutritional Status: BMI 25 -29 Overweight Nutritional Risks: None Diabetes: Yes CBG done?: Yes CBG resulted in Enter/ Edit results?: Yes (CBG 159 Taken by patient) Did pt. bring in CBG monitor from home?: No  How often do you need to have someone help you when you read instructions, pamphlets, or other written materials from your doctor or pharmacy?: 1 - Never  Interpreter Needed?: No  Information entered by :: Theresa Mulligan LPN   Activities of Daily Living    03/21/2023    8:50 AM  In your present state of health, do you have any difficulty performing the following activities:  Hearing? 0  Vision? 0  Difficulty concentrating or making decisions? 0  Walking or climbing stairs? 0  Dressing or bathing? 0  Doing errands, shopping? 0  Preparing Food and eating ? N  Using the Toilet? N  In the past six months, have you accidently leaked urine? N  Do you have problems with loss of bowel control? N  Managing your Medications? N  Managing your Finances? N  Housekeeping or managing your Housekeeping? N    Patient Care Team: Blane Ohara, MD as PCP - General (Family Medicine) Noel Christmas, MD as Consulting Physician (Urology) Marlowe Sax, RN as Case Manager (General Practice)  Indicate any recent Medical Services  you may have received from other than [facility] providers in the past year (date may be approximate).     Assessment:   This is a routine wellness examination for [facility].  Hearing/Vision screen Hearing Screening - Comments:: Denies hearing difficulties   Vision Screening - Comments:: Wears rx glasses - up to date with routine eye exams with  [facility]  Dietary issues and exercise activities discussed:     Goals Addressed               This Visit's Progress     Stay Healthy (pt-stated)         Depression Screen    03/21/2023    8:49 AM 02/13/2023   11:27 AM 07/27/2022   11:21 AM 07/09/2022    2:28 PM 04/05/2022    1:36 PM 12/21/2021   11:49 AM 08/18/2021    8:08 AM  PHQ 2/9 Scores  PHQ - 2 Score 0 2 0 0 0 0 0  PHQ- 9 Score 0 3         Fall Risk    03/21/2023    8:50 AM 02/13/2023   11:27 AM 09/11/2022    9:21 AM 07/09/2022    2:27 PM 04/05/2022    1:36 PM  Fall Risk   Falls in the past year? 0 1 0 1 0  Number falls in past yr: 0 1 0 0 0  Injury with Fall? 0 0 0 0 0  Risk for fall due to : No Fall Risks No Fall Risks No Fall Risks No Fall Risks No Fall Risks  Follow up Falls prevention discussed Follow up appointment Falls evaluation completed Falls evaluation completed Falls evaluation completed    MEDICARE RISK AT HOME:  Medicare Risk at Home - 03/21/23 0857     Any stairs in or around the home? Yes    If so, are there any without handrails? No    Home free of loose throw rugs in walkways, pet beds, electrical cords, etc? Yes    Adequate lighting in your home  to reduce risk of falls? Yes    Life alert? No    Use of a cane, walker or w/c? No    Grab bars in the bathroom? Yes    Shower chair or bench in shower? No    Elevated toilet seat or a handicapped toilet? No             TIMED UP AND GO:  Was the test performed?  No    Cognitive Function:        03/21/2023    8:51 AM 12/21/2021   12:18 PM 07/12/2020    3:04 PM  6CIT Screen  What Year? 0 points 0  points 0 points  What month? 0 points 0 points 0 points  What time? 0 points 0 points 0 points  Count back from 20 0 points 0 points 0 points  Months in reverse 0 points 0 points 0 points  Repeat phrase 0 points 0 points 0 points  Total Score 0 points 0 points 0 points    Immunizations Immunization History  Administered Date(s) Administered   Fluad Quad(high Dose 65+) 06/28/2020, 04/25/2021, 05/21/2022   PFIZER(Purple Top)SARS-COV-2 Vaccination 12/10/2019, 01/08/2020   PNEUMOCOCCAL CONJUGATE-20 05/26/2021   Pneumococcal Polysaccharide-23 06/28/2020    TDAP status: Due, Education has been provided regarding the importance of this vaccine. Advised may receive this vaccine at local pharmacy or Health Dept. Aware to provide a copy of the vaccination record if obtained from local pharmacy or Health Dept. Verbalized acceptance and understanding.  Flu Vaccine status: Due, Education has been provided regarding the importance of this vaccine. Advised may receive this vaccine at local pharmacy or Health Dept. Aware to provide a copy of the vaccination record if obtained from local pharmacy or Health Dept. Verbalized acceptance and understanding.  Pneumococcal vaccine status: Up to date  Covid-19 vaccine status: Declined, Education has been provided regarding the importance of this vaccine but patient still declined. Advised may receive this vaccine at local pharmacy or Health Dept.or vaccine clinic. Aware to provide a copy of the vaccination record if obtained from local pharmacy or Health Dept. Verbalized acceptance and understanding.  Qualifies for Shingles Vaccine? Yes   Zostavax completed No   Shingrix Completed?: No.    Education has been provided regarding the importance of this vaccine. Patient has been advised to call insurance company to determine out of pocket expense if they have not yet received this vaccine. Advised may also receive vaccine at local pharmacy or Health Dept. Verbalized  acceptance and understanding.  Screening Tests Health Maintenance  Topic Date Due   DTaP/Tdap/Td (1 - Tdap) Never done   Zoster Vaccines- Shingrix (1 of 2) Never done   Colonoscopy  Never done   COVID-19 Vaccine (3 - Pfizer risk series) 02/05/2020   OPHTHALMOLOGY EXAM  09/24/2020   INFLUENZA VACCINE  03/14/2023   HEMOGLOBIN A1C  08/14/2023   Diabetic kidney evaluation - Urine ACR  10/11/2023   Diabetic kidney evaluation - eGFR measurement  02/11/2024   FOOT EXAM  02/13/2024   Medicare Annual Wellness (AWV)  03/20/2024   Pneumonia Vaccine 18+ Years old  Completed   Hepatitis C Screening  Completed   HPV VACCINES  Aged Out    Health Maintenance  Health Maintenance Due  Topic Date Due   DTaP/Tdap/Td (1 - Tdap) Never done   Zoster Vaccines- Shingrix (1 of 2) Never done   Colonoscopy  Never done   COVID-19 Vaccine (3 - Pfizer risk series) 02/05/2020  OPHTHALMOLOGY EXAM  09/24/2020   INFLUENZA VACCINE  03/14/2023    Colorectal cancer screening: Referral to GI placed Patient deferred. Pt aware the office will call re: appt.  Lung Cancer Screening: (Low Dose CT Chest recommended if Age 22-80 years, 20 pack-year currently smoking OR have quit w/in 15years.) does not qualify.     Additional Screening:  Hepatitis C Screening: does qualify; Completed 04/25/21  Vision Screening: Recommended annual ophthalmology exams for early detection of glaucoma and other disorders of the eye. Is the patient up to date with their annual eye exam?  Yes  Who is the provider or what is the name of the office in which the patient attends annual eye exams? [facility] If pt is not established with a provider, would they like to be referred to a provider to establish care? No .   Dental Screening: Recommended annual dental exams for proper oral hygiene  Diabetic Foot Exam: Diabetic Foot Exam: Completed 02/13/23  Community Resource Referral / Chronic Care Management:  CRR required this visit?  No    CCM required this visit?  No     Plan:     I have personally reviewed and noted the following in the patient's chart:   Medical and social history Use of alcohol, tobacco or illicit drugs  Current medications and supplements including opioid prescriptions. Patient is not currently taking opioid prescriptions. Functional ability and status Nutritional status Physical activity Advanced directives List of other physicians Hospitalizations, surgeries, and ER visits in previous 12 months Vitals Screenings to include cognitive, depression, and falls Referrals and appointments  In addition, I have reviewed and discussed with patient certain preventive protocols, quality metrics, and best practice recommendations. A written personalized care plan for preventive services as well as general preventive health recommendations were provided to patient.     Tillie Rung, LPN   08/18/1094   After Visit Summary: (MyChart) Due to this being a telephonic visit, the after visit summary with patients personalized plan was offered to patient via MyChart   Nurse Notes: None

## 2023-05-29 ENCOUNTER — Other Ambulatory Visit: Payer: Self-pay | Admitting: Family Medicine

## 2023-05-29 ENCOUNTER — Other Ambulatory Visit: Payer: Self-pay

## 2023-05-29 ENCOUNTER — Telehealth: Payer: Self-pay

## 2023-05-29 DIAGNOSIS — E1142 Type 2 diabetes mellitus with diabetic polyneuropathy: Secondary | ICD-10-CM

## 2023-05-29 NOTE — Patient Outreach (Signed)
Care Management   Visit Note  05/29/2023 Name: Joshua Nelson MRN: [id number] DOB: 05-18-1951  Subjective: Joshua Nelson is a 72 y.o. year old male who is a primary care patient of Cox, Kirsten, MD. The Care Management team was consulted for assistance.      Engaged with patient spoke with the family member (POA, [name], [name]).    Goals Addressed             This Visit's Progress    RNCM Expected Outcome:  Monitor, Self-Manage and Reduce Symptoms of Diabetes       Current Barriers:  Knowledge Deficits related to the need to control DM and keep A1C level down to prevent issues with body systems Care Coordination needs related to financial cost of DM medication, working with pharm D in a patient with DM Chronic Disease Management support and education needs related to effective management of DM Financial Constraints.  Lab Results  Component Value Date   HGBA1C 6.1 (H) 02/11/2023    Previous 07-10-2022 8.2 Planned Interventions: Provided education to patient about basic DM disease process. The patient saw the pcp in early part of July and A1C is at goal. Spoke to his wife today and the patient is maintaining his blood sugars and denies any acute changes. Education and support given. The patients wife will call for new needs or changes. Reviewed medications with patient and discussed importance of medication adherence. The patient is working with the pharm D on medication cost contraints. The patient has his medications. Likes the [name] and says it is working well for him. He denies any new needs related to medications at this time.  Review of pharm D changes and the availability of pharmacy support. The patient wife will call with any new needs as needed. Reviewed prescribed diet with patient heart healthy/ADA. Education and support given. The patient denies any issues with following a heart healthy/ADA diet. The patient is having normalized readings. Denies any acute changes. Will  continue to monitor for changes or new needs; Counseled on importance of regular laboratory monitoring as prescribed. Has regular lab work. A1C is at goal.  Discussed plans with patient for ongoing care management follow up and provided patient with direct contact information for care management team;      Provided patient with written educational materials related to hypo and hyperglycemia and importance of correct treatment. Education and support given. Denies any acute highs or low. He does have sx and sx of hypoglycemia when it gets below 100. Education and support provided.       Reviewed scheduled/upcoming provider appointments including: sees pcp again on 06-03-2023 for follow up        Advised patient, providing education and rationale, to check cbg when you have symptoms of low or high blood sugar and continuously, the patient has a freestyle [place]  and record. The patient continue to use the freestyle libre and it has alarms for lows and highs. His average has been 60 to 170.  He says his blood sugars have been stable. Review of goal of fasting <130 and post prandial of <180       call provider for findings outside established parameters;       Referral made to pharmacy team for assistance with medication cost and pap forms for assistance with medications for management of DM. Review of pharm D support availability. The patient gets assistance with Ozempic;       Review of patient status, including review  of consultants reports, relevant laboratory and other test results, and medications completed;       Advised patient to discuss changes in DM, questions or concerns with provider;      Screening for signs and symptoms of depression related to chronic disease state;        Assessed social determinant of health barriers;       Last foot exam by provider was May 2024 with podiatry. States he checks his feet daily for any changes in his foot care. Last eye exam 03-2021. The patients wife states  they are working on getting an appointment for eye exam. Reminder provided today for eye exam. The patient states he still has not been able to get an eye exam secured but he is planning on calling them himself. He said he was told they were behind.   Symptom Management: Take medications as prescribed   Attend all scheduled provider appointments Call provider office for new concerns or questions  call the Suicide and Crisis Lifeline: 988 call the [place] National Suicide Prevention Lifeline: (754) 050-5798 or TTY: (787) 052-4343 TTY 405-482-7036) to talk to a trained counselor call 1-800-273-TALK (toll free, 24 hour hotline) if experiencing a Mental Health or Behavioral Health Crisis  check feet daily for cuts, sores or redness trim toenails straight across manage portion size wash and dry feet carefully every day wear comfortable, cotton socks wear comfortable, well-fitting shoes        RNCM Expected Outcome:  Monitor, Self-Manage, and Reduce Symptoms of Hypertension       Current Barriers:  Chronic Disease Management support and education needs related to effective management of HTN BP Readings from Last 3 Encounters:  02/13/23 112/62  10/11/22 128/72  07/09/22 110/70     Planned Interventions: Evaluation of current treatment plan related to hypertension self management and patient's adherence to plan as established by provider. The patient has stable blood pressures. The patient denies any new issues related to HTN or heart health, saw the pcp recently and blood pressures are controlled at this time. Will continue to monitor.    Provided education to patient re: stroke prevention, s/s of heart attack and stroke; Reviewed prescribed diet heart healthy/ADA diet. The patient is compliant with heart healthy/ADA diet. Blood sugars are a little elevated on Sundays due to decreased activity and changes in eating patterns. Reviewed medications with patient and discussed importance of  compliance. The patient is compliant with medications. Works with pharm D for education and ongoing support. Review of pharm D changes and if the patient needs pharmacy support to call the [facility] or pharm D as needed. The patient wife verbalized understanding.   Discussed plans with patient for ongoing care management follow up and provided patient with direct contact information for care management team; Advised patient, providing education and rationale, to monitor blood pressure daily and record, calling PCP for findings outside established parameters;  Reviewed scheduled/upcoming provider appointments including: 06-03-2023  Advised patient to discuss changes in HTN or heart health with provider; Provided education on prescribed diet heart healthy/ADA diet ;  Discussed complications of poorly controlled blood pressure such as heart disease, stroke, circulatory complications, vision complications, kidney impairment, sexual dysfunction;  Screening for signs and symptoms of depression related to chronic disease state;  Assessed social determinant of health barriers;   Symptom Management: Take medications as prescribed   Attend all scheduled provider appointments Call provider office for new concerns or questions  call the Suicide and Crisis Lifeline: 988 call the  [place] National Suicide Prevention Lifeline: (425)559-8458 or TTY: 479 149 0348 TTY (956)257-0605) to talk to a trained counselor call 1-800-273-TALK (toll free, 24 hour hotline) if experiencing a Mental Health or Behavioral Health Crisis  check blood pressure weekly learn about high blood pressure call doctor for signs and symptoms of high blood pressure develop an action plan for high blood pressure keep all doctor appointments take medications for blood pressure exactly as prescribed report new symptoms to your doctor            Consent to Services:  Patient was given information about care management services, agreed to  services, and gave verbal consent to participate.   Plan: No further follow up required: the patients wife has the [facility] number to call when needed. The wife knows the support of the [facility] and team and is thankful for the support. Will monitor for changes.  Alto Denver RN, MSN, CCM RN Care Manager  [facility]  Ambulatory Care Management  Direct Number: [phone]

## 2023-05-29 NOTE — Patient Outreach (Signed)
Care Management   Follow Up Note   05/29/2023 Name: ZYION SMIALEK MRN: [id number] DOB: 1951-07-05   Referred by: Blane Ohara, MD Reason for referral : Care Management (RNCM: Follow up for Chronic Disease Management and Care Coordination Needs- attempt)   An unsuccessful telephone outreach was attempted today. The patient was referred to the case management team for assistance with care management and care coordination.   Follow Up Plan: A HIPPA compliant phone message was left for the patient providing contact information and requesting a return call.   Alto Denver RN, MSN, CCM RN Care Manager  [facility]  Ambulatory Care Management  Direct Number: [phone]

## 2023-05-29 NOTE — Patient Instructions (Signed)
Visit Information  Thank you for taking time to visit with me today. Please don't hesitate to contact me if I can be of assistance to you before our next scheduled telephone appointment.  Following are the goals we discussed today:   Goals Addressed             This Visit's Progress    RNCM Expected Outcome:  Monitor, Self-Manage and Reduce Symptoms of Diabetes       Current Barriers:  Knowledge Deficits related to the need to control DM and keep A1C level down to prevent issues with body systems Care Coordination needs related to financial cost of DM medication, working with pharm D in a patient with DM Chronic Disease Management support and education needs related to effective management of DM Financial Constraints.  Lab Results  Component Value Date   HGBA1C 6.1 (H) 02/11/2023    Previous 07-10-2022 8.2 Planned Interventions: Provided education to patient about basic DM disease process. The patient saw the pcp in early part of July and A1C is at goal. Spoke to his wife today and the patient is maintaining his blood sugars and denies any acute changes. Education and support given. The patients wife will call for new needs or changes. Reviewed medications with patient and discussed importance of medication adherence. The patient is working with the pharm D on medication cost contraints. The patient has his medications. Likes the [name] and says it is working well for him. He denies any new needs related to medications at this time.  Review of pharm D changes and the availability of pharmacy support. The patient wife will call with any new needs as needed. Reviewed prescribed diet with patient heart healthy/ADA. Education and support given. The patient denies any issues with following a heart healthy/ADA diet. The patient is having normalized readings. Denies any acute changes. Will continue to monitor for changes or new needs; Counseled on importance of regular laboratory monitoring as  prescribed. Has regular lab work. A1C is at goal.  Discussed plans with patient for ongoing care management follow up and provided patient with direct contact information for care management team;      Provided patient with written educational materials related to hypo and hyperglycemia and importance of correct treatment. Education and support given. Denies any acute highs or low. He does have sx and sx of hypoglycemia when it gets below 100. Education and support provided.       Reviewed scheduled/upcoming provider appointments including: sees pcp again on 06-03-2023 for follow up        Advised patient, providing education and rationale, to check cbg when you have symptoms of low or high blood sugar and continuously, the patient has a freestyle [place]  and record. The patient continue to use the freestyle libre and it has alarms for lows and highs. His average has been 60 to 170.  He says his blood sugars have been stable. Review of goal of fasting <130 and post prandial of <180       call provider for findings outside established parameters;       Referral made to pharmacy team for assistance with medication cost and pap forms for assistance with medications for management of DM. Review of pharm D support availability. The patient gets assistance with Ozempic;       Review of patient status, including review of consultants reports, relevant laboratory and other test results, and medications completed;       Advised patient to discuss  changes in DM, questions or concerns with provider;      Screening for signs and symptoms of depression related to chronic disease state;        Assessed social determinant of health barriers;       Last foot exam by provider was May 2024 with podiatry. States he checks his feet daily for any changes in his foot care. Last eye exam 03-2021. The patients wife states they are working on getting an appointment for eye exam. Reminder provided today for eye exam. The patient  states he still has not been able to get an eye exam secured but he is planning on calling them himself. He said he was told they were behind.   Symptom Management: Take medications as prescribed   Attend all scheduled provider appointments Call provider office for new concerns or questions  call the Suicide and Crisis Lifeline: 988 call the [place] National Suicide Prevention Lifeline: 580-522-9659 or TTY: 250-231-6243 TTY 640-290-7541) to talk to a trained counselor call 1-800-273-TALK (toll free, 24 hour hotline) if experiencing a Mental Health or Behavioral Health Crisis  check feet daily for cuts, sores or redness trim toenails straight across manage portion size wash and dry feet carefully every day wear comfortable, cotton socks wear comfortable, well-fitting shoes        RNCM Expected Outcome:  Monitor, Self-Manage, and Reduce Symptoms of Hypertension       Current Barriers:  Chronic Disease Management support and education needs related to effective management of HTN BP Readings from Last 3 Encounters:  02/13/23 112/62  10/11/22 128/72  07/09/22 110/70     Planned Interventions: Evaluation of current treatment plan related to hypertension self management and patient's adherence to plan as established by provider. The patient has stable blood pressures. The patient denies any new issues related to HTN or heart health, saw the pcp recently and blood pressures are controlled at this time. Will continue to monitor.    Provided education to patient re: stroke prevention, s/s of heart attack and stroke; Reviewed prescribed diet heart healthy/ADA diet. The patient is compliant with heart healthy/ADA diet. Blood sugars are a little elevated on Sundays due to decreased activity and changes in eating patterns. Reviewed medications with patient and discussed importance of compliance. The patient is compliant with medications. Works with pharm D for education and ongoing support. Review  of pharm D changes and if the patient needs pharmacy support to call the [facility] or pharm D as needed. The patient wife verbalized understanding.   Discussed plans with patient for ongoing care management follow up and provided patient with direct contact information for care management team; Advised patient, providing education and rationale, to monitor blood pressure daily and record, calling PCP for findings outside established parameters;  Reviewed scheduled/upcoming provider appointments including: 06-03-2023  Advised patient to discuss changes in HTN or heart health with provider; Provided education on prescribed diet heart healthy/ADA diet ;  Discussed complications of poorly controlled blood pressure such as heart disease, stroke, circulatory complications, vision complications, kidney impairment, sexual dysfunction;  Screening for signs and symptoms of depression related to chronic disease state;  Assessed social determinant of health barriers;   Symptom Management: Take medications as prescribed   Attend all scheduled provider appointments Call provider office for new concerns or questions  call the Suicide and Crisis Lifeline: 988 call the [place] National Suicide Prevention Lifeline: 385-363-2048 or TTY: 919-531-6305 TTY 317-022-7007) to talk to a trained counselor call 1-800-273-TALK (toll free, 24  hour hotline) if experiencing a Mental Health or Behavioral Health Crisis  check blood pressure weekly learn about high blood pressure call doctor for signs and symptoms of high blood pressure develop an action plan for high blood pressure keep all doctor appointments take medications for blood pressure exactly as prescribed report new symptoms to your doctor              Please call the care guide team at [phone] if you need to cancel or reschedule your appointment.   If you are experiencing a Mental Health or Behavioral Health Crisis or need someone to talk to, please  call the Suicide and Crisis Lifeline: 988 call the [place] National Suicide Prevention Lifeline: 314-812-7648 or TTY: 678-267-2688 TTY 707-762-5680) to talk to a trained counselor call 1-800-273-TALK (toll free, 24 hour hotline) go to [facility] [address] [place] [phone])   The patient verbalized understanding of instructions, educational materials, and care plan provided today and DECLINED offer to receive copy of patient instructions, educational materials, and care plan.   No further follow up required: the patients wife will call with any new needs or concerns. Reviewed with the wife today the support available from the team.  Alto Denver RN, MSN, CCM RN Care Manager  [facility]  Ambulatory Care Management  Direct Number: [phone]

## 2023-06-02 NOTE — Assessment & Plan Note (Signed)
Continue vesicare 

## 2023-06-02 NOTE — Progress Notes (Deleted)
Subjective:  Patient ID: Joshua Nelson, male    DOB: 07-05-1951  Age: 72 y.o. MRN: [id number]  Chief Complaint  Patient presents with   Medical Management of Chronic Issues    HPI   Diabetes:  Complications: hyperlipidemia, hypertension, and neuropathy  Glucose checking:  patient has Freestyle Libre 2.  Glucose logs: 61-216 Hypoglycemia: once about a week ago.  Most recent A1C: 6.1 Current medications: Farxiga 10 mg take 1 tablet daily, is not taking Lantus inject 35 units daily.  Patient is taking ozempic 2 mg weekly, Gabapentin 300 mg THREE TIMES A DAY. Not taking fiasp. Last Eye Exam: overdue. Foot checks: Daily   Hyperlipidemia: Current medications:  Crestor 40 mg  take 1 tablet daily, Zetia 10 mg take 1 tablet daily.    Hypertensive with heart disease:: Current medications: Metoprolol 25 mg take 1 tablet daily, Lisinopril 5 mg  mg take 1 tablet daily   Diet: Fairly healthy most of the time.   Exercise: some walking.      03/21/2023    8:49 AM 02/13/2023   11:27 AM 07/27/2022   11:21 AM 07/09/2022    2:28 PM 04/05/2022    1:36 PM  Depression screen PHQ 2/9  Decreased Interest 0 2 0 0 0  Down, Depressed, Hopeless 0 0 0 0 0  PHQ - 2 Score 0 2 0 0 0  Altered sleeping 0 0     Tired, decreased energy 0 1     Change in appetite 0 0     Feeling bad or failure about yourself  0 0     Trouble concentrating 0 0     Moving slowly or fidgety/restless 0 0     Suicidal thoughts 0 0     PHQ-9 Score 0 3     Difficult doing work/chores Not difficult at all Not difficult at all           03/21/2023    8:50 AM  Fall Risk   Falls in the past year? 0  Number falls in past yr: 0  Injury with Fall? 0  Risk for fall due to : No Fall Risks  Follow up Falls prevention discussed    Patient Care Team: Blane Ohara, MD as PCP - General (Family Medicine) Noel Christmas, MD as Consulting Physician (Urology) Marlowe Sax, RN as Case Manager (General Practice)   Review of  Systems  Current Outpatient Medications on File Prior to Visit  Medication Sig Dispense Refill   Continuous Glucose Sensor (FREESTYLE LIBRE 2 SENSOR) MISC Change sensor every 10 days as directed, 2 each 5   ezetimibe (ZETIA) 10 MG tablet TAKE ONE (1) TABLET ONCE DAILY 90 tablet 1   FARXIGA 10 MG TABS tablet TAKE 1 TABLET BY MOUTH ONCE DAILY IN THE MORNING AS DIRECTED. 90 tablet 1   gabapentin (NEURONTIN) 300 MG capsule Take 1 capsule (300 mg total) by mouth 3 (three) times daily. 270 capsule 1   insulin aspart (FIASP FLEXTOUCH) 100 UNIT/ML FlexTouch Pen Inject 16 Units into the skin 2 (two) times daily. With breakfast and supper (Patient not taking: Reported on 02/13/2023)     LANTUS SOLOSTAR 100 UNIT/ML Solostar Pen Inject 65 Units into the skin daily. Pt takes in the pm per wife (Patient not taking: Reported on 02/13/2023) 30 mL 1   lisinopril (ZESTRIL) 5 MG tablet Once daily in the am 90 tablet 0   Meclizine HCl 25 MG CHEW TAKE 1 TABLET THREE TIMES  DAILY AS NEEDED FOR DIZZINESS 30 tablet 0   metoprolol succinate (TOPROL-XL) 25 MG 24 hr tablet TAKE 1 TABLET BY MOUTH ONCE DAILY IN THE MORNING. 90 tablet 1   rosuvastatin (CRESTOR) 40 MG tablet Take 1 tablet (40 mg total) by mouth at bedtime. 90 tablet 1   Semaglutide, 2 MG/DOSE, (OZEMPIC, 2 MG/DOSE,) 8 MG/3ML SOPN Inject 2 mg into the skin once a week. 9 mL 0   No current facility-administered medications on file prior to visit.   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   PONV (postoperative nausea and vomiting)    Prostate cancer (HCC) 07/2021   Past Surgical History:  Procedure Laterality Date   CYSTOSCOPY WITH URETHRAL DILATATION N/A  08/01/2021   Procedure: CYSTOSCOPY WITH BALLOON URETHRAL DILATATION;  Surgeon: Noel Christmas, MD;  Location: [facility];  Service: Urology;  Laterality: N/A;   heart stents     x 3   left cataract  10/2019   nose surgery for dog bite as a kid      right cataract Right 08/2019   TRANSURETHRAL RESECTION OF BLADDER TUMOR N/A 08/01/2021   Procedure: TRANSURETHRAL RESECTION OF PROSTATE;  Surgeon: Noel Christmas, MD;  Location: [facility];  Service: Urology;  Laterality: N/A;  90 MINS    Family History  Problem Relation Age of Onset   Endometrial cancer Mother    CAD Mother    AAA (abdominal aortic aneurysm) Mother    Healthy Father    Heart attack Brother    Social History   Socioeconomic History   Marital status: Married    Spouse name: Olegario Messier   Number of children: 1   Years of education: 12   Highest education level: High school graduate  Occupational History   Occupation: Retired  Tobacco Use   Smoking status: Former    Current packs/day: 0.00    Types: Cigarettes    Quit date: 2003    Years since quitting: 21.8   Smokeless tobacco: Never  Vaping Use   Vaping status: Never Used  Substance and Sexual Activity   Alcohol use: Not Currently    Comment: none since 2000   Drug use: Never   Sexual activity: Not on file  Other Topics Concern   Not on file  Social History Narrative   Lives at home with his wife.   2 cups caffeine per day.   Right-handed.   Social Determinants of Health   Financial Resource Strain: Low Risk  (03/21/2023)   Overall Financial Resource Strain (CARDIA)    Difficulty of Paying Living Expenses: Not hard at all  Food Insecurity: No Food Insecurity (03/21/2023)   Hunger Vital Sign    Worried About Running Out of Food in the Last Year: Never true    Ran Out of Food in the Last Year: Never true  Transportation Needs: No Transportation Needs (03/21/2023)   PRAPARE - Administrator, Civil Service (Medical): No    Lack of Transportation  (Non-Medical): No  Physical Activity: Insufficiently Active (03/21/2023)   Exercise Vital Sign    Days of Exercise per Week: 7 days    Minutes of Exercise  per Session: 20 min  Stress: No Stress Concern Present (03/21/2023)   [employer] of Occupational Health - Occupational Stress Questionnaire    Feeling of Stress : Not at all  Social Connections: Socially Integrated (03/21/2023)   Social Connection and Isolation Panel [NHANES]    Frequency of Communication with Friends and Family: More than three times a week    Frequency of Social Gatherings with Friends and Family: More than three times a week    Attends Religious Services: More than 4 times per year    Active Member of [employer] or Organizations: Yes    Attends Engineer, structural: More than 4 times per year    Marital Status: Married    Objective:  There were no vitals taken for this visit.     03/21/2023    8:40 AM 02/13/2023   11:17 AM 10/11/2022    7:49 AM  BP/Weight  Systolic BP -- 112 128  Diastolic BP -- 62 72  Wt. (Lbs) 189 189 201  BMI 27.91 kg/m2 27.91 kg/m2 29.68 kg/m2    Physical Exam  Diabetic Foot Exam - Simple   No data filed      Lab Results  Component Value Date   WBC 7.7 02/11/2023   HGB 13.2 02/11/2023   HCT 39.3 02/11/2023   PLT 155 02/11/2023   GLUCOSE 107 (H) 02/11/2023   CHOL 138 02/11/2023   TRIG 77 02/11/2023   HDL 40 02/11/2023   LDLCALC 83 02/11/2023   ALT 14 02/11/2023   AST 16 02/11/2023   NA 139 02/11/2023   K 4.3 02/11/2023   CL 101 02/11/2023   CREATININE 0.73 (L) 02/11/2023   BUN 11 02/11/2023   CO2 24 02/11/2023   TSH 0.897 07/10/2022   HGBA1C 6.1 (H) 02/11/2023   MICROALBUR 150 08/18/2021      Assessment & Plan:    Hypertensive heart disease without heart failure Assessment & Plan: Well controlled.  No changes to medicines.  Metoprolol 25 mg take 1 tablet daily, Lisinopril 5 mg  mg take 1 tablet daily Continue to work on eating a healthy diet and  exercise.      BPH with obstruction/lower urinary tract symptoms Assessment & Plan: Continue vesicare.   Controlled type 2 diabetes mellitus with diabetic polyneuropathy, with long-term current use of insulin (HCC) Assessment & Plan: Control: Well controlled Recommend check sugars fasting daily. Recommend check feet daily. Recommend annual eye exams. Medicines: Farxiga 10 mg take 1 tablet daily, Ozempic 2 mg weekly. Continue to work on eating a healthy diet and exercise.  Labs drawn today.     Mixed hyperlipidemia     No orders of the defined types were placed in this encounter.   No orders of the defined types were placed in this encounter.    Follow-up: No follow-ups on file.   I,Chanise Habeck I Leal-Borjas,acting as a scribe for Blane Ohara, MD.,have documented all relevant documentation on the behalf of Blane Ohara, MD,as directed by  Blane Ohara, MD while in the presence of Blane Ohara, MD.   An After Visit Summary was printed and given to the patient.  Blane Ohara, MD [facility] [phone]

## 2023-06-02 NOTE — Assessment & Plan Note (Signed)
Control: Well controlled Recommend check sugars fasting daily. Recommend check feet daily. Recommend annual eye exams. Medicines: Farxiga 10 mg take 1 tablet daily, Ozempic 2 mg weekly. Continue to work on eating a healthy diet and exercise.  Labs drawn today.

## 2023-06-02 NOTE — Assessment & Plan Note (Signed)
Well controlled.  No changes to medicines.  Metoprolol 25 mg take 1 tablet daily, Lisinopril 5 mg  mg take 1 tablet daily Continue to work on eating a healthy diet and exercise.

## 2023-06-03 ENCOUNTER — Encounter (INDEPENDENT_AMBULATORY_CARE_PROVIDER_SITE_OTHER): Payer: PPO | Admitting: Family Medicine

## 2023-06-03 ENCOUNTER — Telehealth: Payer: Self-pay

## 2023-06-03 ENCOUNTER — Encounter: Payer: Self-pay | Admitting: Family Medicine

## 2023-06-03 DIAGNOSIS — Z794 Long term (current) use of insulin: Secondary | ICD-10-CM

## 2023-06-03 DIAGNOSIS — N401 Enlarged prostate with lower urinary tract symptoms: Secondary | ICD-10-CM

## 2023-06-03 DIAGNOSIS — E1142 Type 2 diabetes mellitus with diabetic polyneuropathy: Secondary | ICD-10-CM

## 2023-06-03 DIAGNOSIS — E782 Mixed hyperlipidemia: Secondary | ICD-10-CM

## 2023-06-03 DIAGNOSIS — I119 Hypertensive heart disease without heart failure: Secondary | ICD-10-CM

## 2023-06-03 NOTE — Telephone Encounter (Signed)
Message left for patient to call back to reschedule appointment.

## 2023-06-04 ENCOUNTER — Encounter: Payer: Self-pay | Admitting: Podiatry

## 2023-06-04 ENCOUNTER — Ambulatory Visit (INDEPENDENT_AMBULATORY_CARE_PROVIDER_SITE_OTHER): Payer: PPO | Admitting: Podiatry

## 2023-06-04 ENCOUNTER — Other Ambulatory Visit: Payer: PPO

## 2023-06-04 DIAGNOSIS — E782 Mixed hyperlipidemia: Secondary | ICD-10-CM | POA: Diagnosis not present

## 2023-06-04 DIAGNOSIS — N138 Other obstructive and reflux uropathy: Secondary | ICD-10-CM

## 2023-06-04 DIAGNOSIS — E1142 Type 2 diabetes mellitus with diabetic polyneuropathy: Secondary | ICD-10-CM

## 2023-06-04 DIAGNOSIS — B351 Tinea unguium: Secondary | ICD-10-CM

## 2023-06-04 DIAGNOSIS — M79675 Pain in left toe(s): Secondary | ICD-10-CM | POA: Diagnosis not present

## 2023-06-04 DIAGNOSIS — I119 Hypertensive heart disease without heart failure: Secondary | ICD-10-CM | POA: Diagnosis not present

## 2023-06-04 DIAGNOSIS — Z794 Long term (current) use of insulin: Secondary | ICD-10-CM | POA: Diagnosis not present

## 2023-06-04 DIAGNOSIS — M79674 Pain in right toe(s): Secondary | ICD-10-CM

## 2023-06-04 DIAGNOSIS — N401 Enlarged prostate with lower urinary tract symptoms: Secondary | ICD-10-CM

## 2023-06-04 NOTE — Progress Notes (Addendum)
Subjective:  Patient ID: Joshua Nelson, male    DOB: Feb 04, 1951,  MRN: [id number]  Chief Complaint  Patient presents with   Nail Problem    DFC.    72 y.o. male presents with the above complaint. History confirmed with patient. Patient presenting with pain related to dystrophic thickened elongated nails. Patient is unable to trim own nails related to nail dystrophy and/or mobility issues. Patient does have a history of T2DM with peripheral neuropathy.  Objective:  Physical Exam: warm, good capillary refill, DP and PT pulses 1/4 bilateral nail exam onychomycosis of the toenails, onycholysis, and dystrophic nails DP pulses palpable, PT pulses palpable, and protective sensation absent Left Foot:  Pain with palpation of nails due to elongation and dystrophic growth.  Right Foot: Pain with palpation of nails due to elongation and dystrophic growth.   Assessment:   1. Pain due to onychomycosis of toenails of both feet   2. DM type 2 with diabetic peripheral neuropathy (HCC)     Plan:  Patient was evaluated and treated and all questions answered.  #Onychomycosis with pain  -Nails palliatively debrided as below. -Educated on self-care  Procedure: Nail Debridement Rationale: Pain Type of Debridement: manual, sharp debridement. Instrumentation: Nail nipper, rotary burr. Number of Nails: 10  Return in about 3 months (around 09/04/2023), or Diabetic Foot Care, Onychomycosis.         Bronwen Betters, DPM [facility] / [facility]

## 2023-06-05 ENCOUNTER — Encounter: Payer: Self-pay | Admitting: Family Medicine

## 2023-06-05 LAB — LIPID PANEL
Chol/HDL Ratio: 5.1 ratio — ABNORMAL HIGH (ref 0.0–5.0)
Cholesterol, Total: 204 mg/dL — ABNORMAL HIGH (ref 100–199)
HDL: 40 mg/dL (ref >39)
LDL Chol Calc (NIH): 149 mg/dL — ABNORMAL HIGH (ref 0–99)
Triglycerides: 81 mg/dL (ref 0–149)
VLDL Cholesterol Cal: 15 mg/dL (ref 5–40)

## 2023-06-05 LAB — CBC WITH DIFFERENTIAL/PLATELET
Basophils Absolute: 0.1 10*3/uL (ref 0.0–0.2)
Basos: 1 %
EOS (ABSOLUTE): 0.4 10*3/uL (ref 0.0–0.4)
Eos: 5 %
Hematocrit: 38.6 % (ref 37.5–51.0)
Hemoglobin: 12.8 g/dL — ABNORMAL LOW (ref 13.0–17.7)
Immature Grans (Abs): 0 10*3/uL (ref 0.0–0.1)
Immature Granulocytes: 0 %
Lymphocytes Absolute: 1.4 10*3/uL (ref 0.7–3.1)
Lymphs: 19 %
MCH: 29.6 pg (ref 26.6–33.0)
MCHC: 33.2 g/dL (ref 31.5–35.7)
MCV: 89 fL (ref 79–97)
Monocytes Absolute: 0.5 10*3/uL (ref 0.1–0.9)
Monocytes: 6 %
Neutrophils Absolute: 5 10*3/uL (ref 1.4–7.0)
Neutrophils: 69 %
Platelets: 191 10*3/uL (ref 150–450)
RBC: 4.32 x10E6/uL (ref 4.14–5.80)
RDW: 12.5 % (ref 11.6–15.4)
WBC: 7.3 10*3/uL (ref 3.4–10.8)

## 2023-06-05 LAB — COMPREHENSIVE METABOLIC PANEL
ALT: 11 [IU]/L (ref 0–44)
AST: 13 [IU]/L (ref 0–40)
Albumin: 4.5 g/dL (ref 3.9–4.9)
Alkaline Phosphatase: 72 [IU]/L (ref 44–121)
BUN/Creatinine Ratio: 13 (ref 10–24)
BUN: 10 mg/dL (ref 8–27)
Bilirubin Total: 0.5 mg/dL (ref 0.0–1.2)
CO2: 24 mmol/L (ref 20–29)
Calcium: 9.6 mg/dL (ref 8.6–10.2)
Chloride: 102 mmol/L (ref 96–106)
Creatinine, Ser: 0.76 mg/dL (ref 0.76–1.27)
Globulin, Total: 2.4 g/dL (ref 1.5–4.5)
Glucose: 104 mg/dL — ABNORMAL HIGH (ref 70–99)
Potassium: 4.9 mmol/L (ref 3.5–5.2)
Sodium: 139 mmol/L (ref 134–144)
Total Protein: 6.9 g/dL (ref 6.0–8.5)
eGFR: 97 mL/min/{1.73_m2} (ref >59)

## 2023-06-05 LAB — PSA: Prostate Specific Ag, Serum: 0.3 ng/mL (ref 0.0–4.0)

## 2023-06-05 LAB — HEMOGLOBIN A1C
Est. average glucose Bld gHb Est-mCnc: 114 mg/dL
Hgb A1c MFr Bld: 5.6 % (ref 4.8–5.6)

## 2023-06-05 NOTE — Assessment & Plan Note (Signed)
Continue vesicare 

## 2023-06-05 NOTE — Assessment & Plan Note (Signed)
Well controlled.  No changes to medicines.  Metoprolol 25 mg take 1 tablet daily, Lisinopril 5 mg  mg take 1 tablet daily Continue to work on eating a healthy diet and exercise.

## 2023-06-05 NOTE — Assessment & Plan Note (Signed)
Well controlled.  No changes to medicines. Crestor 40 mg  take 1 tablet daily, Zetia 10 mg take 1 tablet daily.  Continue to work on eating a healthy diet and exercise.

## 2023-06-05 NOTE — Assessment & Plan Note (Addendum)
Control: Well controlled Recommend check sugars fasting daily. Recommend check feet daily. Recommend annual eye exams. Medicines: Farxiga 10 mg take 1 tablet daily, Ozempic 2 mg weekly. Continue to work on eating a healthy diet and exercise.  Labs drawn today.  Recommend call [facility] care to schedule your diabetic eye appointment.  Recommend the following vaccines (Tetanus -TDAP) and Shingrix series.

## 2023-06-05 NOTE — Progress Notes (Signed)
Subjective:  Patient ID: Joshua Nelson, male    DOB: 02/12/1951  Age: 72 y.o. MRN: [id number]  Chief Complaint  Patient presents with   Medical Management of Chronic Issues    HPI   Diabetes:  Complications: hyperlipidemia, hypertension, and neuropathy  Glucose checking:  patient has Freestyle Libre 2.  Glucose logs: 61-216 Hypoglycemia: once about a week ago.  Most recent A1C: 5.6 Current medications: Farxiga 10 mg take 1 tablet daily, is not taking Lantus inject 35 units daily.  Patient is taking ozempic 2 mg weekly, Gabapentin 300 mg THREE TIMES A DAY. Not taking fiasp. Not taking aspirin. Last Eye Exam: overdue. Foot checks: Daily     Hyperlipidemia: Current medications:  Crestor 40 mg  take 1 tablet daily, Zetia 10 mg take 1 tablet daily.    Hypertensive with heart disease:: Current medications: Metoprolol 25 mg take 1 tablet daily, Lisinopril 5 mg  mg take 1 tablet daily   Diet: Fairly healthy most of the time.   Exercise: some walking.      03/21/2023    8:49 AM 02/13/2023   11:27 AM 07/27/2022   11:21 AM 07/09/2022    2:28 PM 04/05/2022    1:36 PM  Depression screen PHQ 2/9  Decreased Interest 0 2 0 0 0  Down, Depressed, Hopeless 0 0 0 0 0  PHQ - 2 Score 0 2 0 0 0  Altered sleeping 0 0     Tired, decreased energy 0 1     Change in appetite 0 0     Feeling bad or failure about yourself  0 0     Trouble concentrating 0 0     Moving slowly or fidgety/restless 0 0     Suicidal thoughts 0 0     PHQ-9 Score 0 3     Difficult doing work/chores Not difficult at all Not difficult at all           03/21/2023    8:50 AM  Fall Risk   Falls in the past year? 0  Number falls in past yr: 0  Injury with Fall? 0  Risk for fall due to : No Fall Risks  Follow up Falls prevention discussed    Patient Care Team: Blane Ohara, MD as PCP - General (Family Medicine) Noel Christmas, MD as Consulting Physician (Urology) Marlowe Sax, RN as Case Manager (General  Practice)   Review of Systems  Constitutional:  Negative for chills, diaphoresis, fatigue and fever.  HENT:  Negative for congestion, ear pain and sore throat.   Respiratory:  Negative for cough and shortness of breath.   Cardiovascular:  Negative for chest pain and leg swelling.  Gastrointestinal:  Negative for abdominal pain, constipation, diarrhea, nausea and vomiting.  Genitourinary:  Negative for dysuria and urgency.  Musculoskeletal:  Negative for arthralgias and myalgias.  Neurological:  Negative for dizziness and headaches.  Psychiatric/Behavioral:  Negative for dysphoric mood.     Current Outpatient Medications on File Prior to Visit  Medication Sig Dispense Refill   Continuous Glucose Sensor (FREESTYLE LIBRE 2 SENSOR) MISC Change sensor every 10 days as directed, 2 each 5   ezetimibe (ZETIA) 10 MG tablet TAKE ONE (1) TABLET ONCE DAILY 90 tablet 1   FARXIGA 10 MG TABS tablet TAKE 1 TABLET BY MOUTH ONCE DAILY IN THE MORNING AS DIRECTED. 90 tablet 1   gabapentin (NEURONTIN) 300 MG capsule Take 1 capsule (300 mg total) by mouth 3 (three) times  daily. 270 capsule 1   insulin aspart (FIASP FLEXTOUCH) 100 UNIT/ML FlexTouch Pen Inject 16 Units into the skin 2 (two) times daily. With breakfast and supper     LANTUS SOLOSTAR 100 UNIT/ML Solostar Pen Inject 65 Units into the skin daily. Pt takes in the pm per wife 30 mL 1   lisinopril (ZESTRIL) 5 MG tablet Once daily in the am 90 tablet 0   Meclizine HCl 25 MG CHEW TAKE 1 TABLET THREE TIMES DAILY AS NEEDED FOR DIZZINESS 30 tablet 0   metoprolol succinate (TOPROL-XL) 25 MG 24 hr tablet TAKE 1 TABLET BY MOUTH ONCE DAILY IN THE MORNING. 90 tablet 1   rosuvastatin (CRESTOR) 40 MG tablet Take 1 tablet (40 mg total) by mouth at bedtime. 90 tablet 1   Semaglutide, 2 MG/DOSE, (OZEMPIC, 2 MG/DOSE,) 8 MG/3ML SOPN Inject 2 mg into the skin once a week. 9 mL 0   No current facility-administered medications on file prior to visit.   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   PONV (postoperative nausea and vomiting)    Prostate cancer (HCC) 07/2021   Past Surgical History:  Procedure Laterality Date   CYSTOSCOPY WITH URETHRAL DILATATION N/A 08/01/2021   Procedure: CYSTOSCOPY WITH BALLOON URETHRAL DILATATION;  Surgeon: Noel Christmas, MD;  Location: [facility];  Service: Urology;  Laterality: N/A;   heart stents     x 3   left cataract  10/2019   nose surgery for dog bite as a kid      right cataract Right 08/2019   TRANSURETHRAL RESECTION OF BLADDER TUMOR N/A 08/01/2021   Procedure: TRANSURETHRAL RESECTION OF PROSTATE;  Surgeon: Noel Christmas, MD;  Location: [facility];  Service: Urology;  Laterality: N/A;  90 MINS    Family History  Problem Relation Age of Onset   Endometrial cancer Mother    CAD Mother    AAA (abdominal aortic aneurysm) Mother    Healthy Father    Heart attack Brother    Social History   Socioeconomic History   Marital status: Married    Spouse name: Olegario Messier   Number of children: 1   Years of education: 12   Highest education level: High school graduate  Occupational History   Occupation: Retired  Tobacco Use   Smoking status: Former    Current packs/day: 0.00    Types: Cigarettes    Quit date: 2003    Years since quitting: 21.8   Smokeless tobacco: Never  Vaping Use   Vaping status: Never Used  Substance and Sexual Activity   Alcohol use: Not Currently    Comment: none since 2000   Drug use: Never   Sexual activity: Not on file  Other Topics Concern   Not on file  Social History Narrative   Lives at home with his wife.   2 cups caffeine per day.   Right-handed.   Social Determinants  of Health   Financial Resource Strain: Low Risk  (03/21/2023)   Overall Financial Resource Strain (CARDIA)    Difficulty of Paying Living Expenses: Not hard at all  Food Insecurity: No Food Insecurity (03/21/2023)   Hunger Vital Sign  Worried About Programme researcher, broadcasting/film/video in the Last Year: Never true    Ran Out of Food in the Last Year: Never true  Transportation Needs: No Transportation Needs (03/21/2023)   PRAPARE - Administrator, Civil Service (Medical): No    Lack of Transportation (Non-Medical): No  Physical Activity: Insufficiently Active (03/21/2023)   Exercise Vital Sign    Days of Exercise per Week: 7 days    Minutes of Exercise per Session: 20 min  Stress: No Stress Concern Present (03/21/2023)   [employer] of Occupational Health - Occupational Stress Questionnaire    Feeling of Stress : Not at all  Social Connections: Socially Integrated (03/21/2023)   Social Connection and Isolation Panel [NHANES]    Frequency of Communication with Friends and Family: More than three times a week    Frequency of Social Gatherings with Friends and Family: More than three times a week    Attends Religious Services: More than 4 times per year    Active Member of [employer] or Organizations: Yes    Attends Engineer, structural: More than 4 times per year    Marital Status: Married    Objective:  BP 130/76 (BP Location: Left Arm, Patient Position: Sitting)   Pulse 92   Temp 97.6 F (36.4 C) (Temporal)   Ht 5\' 9"  (1.753 m)   Wt 183 lb (83 kg)   SpO2 99%   BMI 27.02 kg/m      06/06/2023   12:00 PM 03/21/2023    8:40 AM 02/13/2023   11:17 AM  BP/Weight  Systolic BP 130 -- 112  Diastolic BP 76 -- 62  Wt. (Lbs) 183 189 189  BMI 27.02 kg/m2 27.91 kg/m2 27.91 kg/m2    Physical Exam Vitals reviewed.  Constitutional:      Appearance: Normal appearance. He is normal weight.  Cardiovascular:     Rate and Rhythm: Normal rate and regular rhythm.     Heart sounds: No murmur  heard. Pulmonary:     Effort: Pulmonary effort is normal.     Breath sounds: Normal breath sounds.  Abdominal:     General: Abdomen is flat. Bowel sounds are normal.     Palpations: Abdomen is soft.     Tenderness: There is no abdominal tenderness.  Neurological:     Mental Status: He is alert and oriented to person, place, and time.  Psychiatric:        Mood and Affect: Mood normal.        Behavior: Behavior normal.     Diabetic Foot Exam - Simple   Simple Foot Form  06/05/2023  4:05 PM  Visual Inspection No deformities, no ulcerations, no other skin breakdown bilaterally: Yes Sensation Testing Intact to touch and monofilament testing bilaterally: Yes Pulse Check Posterior Tibialis and Dorsalis pulse intact bilaterally: Yes Comments      Lab Results  Component Value Date   WBC 7.3 06/04/2023   HGB 12.8 (L) 06/04/2023   HCT 38.6 06/04/2023   PLT 191 06/04/2023   GLUCOSE 104 (H) 06/04/2023   CHOL 204 (H) 06/04/2023   TRIG 81 06/04/2023   HDL 40 06/04/2023   LDLCALC 149 (H) 06/04/2023   ALT 11 06/04/2023   AST 13 06/04/2023   NA 139 06/04/2023   K 4.9 06/04/2023   CL 102 06/04/2023   CREATININE 0.76 06/04/2023   BUN 10 06/04/2023   CO2 24 06/04/2023   TSH 0.897 07/10/2022   HGBA1C 5.6 06/04/2023  MICROALBUR 150 08/18/2021      Assessment & Plan:    Hypertensive heart disease without heart failure Assessment & Plan: Well controlled.  No changes to medicines.  Metoprolol 25 mg take 1 tablet daily, Lisinopril 5 mg  mg take 1 tablet daily Continue to work on eating a healthy diet and exercise.      BPH with obstruction/lower urinary tract symptoms Assessment & Plan: Continue vesicare.   Controlled type 2 diabetes mellitus with diabetic polyneuropathy, with long-term current use of insulin (HCC) Assessment & Plan: Control: Well controlled Recommend check sugars fasting daily. Recommend check feet daily. Recommend annual eye exams. Medicines:  Farxiga 10 mg take 1 tablet daily, Ozempic 2 mg weekly. Continue to work on eating a healthy diet and exercise.  Labs drawn today.  Recommend call [facility] care to schedule your diabetic eye appointment.  Recommend the following vaccines (Tetanus -TDAP) and Shingrix series.   Mixed hyperlipidemia Assessment & Plan: Well controlled.  No changes to medicines. Crestor 40 mg  take 1 tablet daily, Zetia 10 mg take 1 tablet daily.  Continue to work on eating a healthy diet and exercise.     Encounter for immunization -     Flu Vaccine Trivalent High Dose (Fluad)  Overweight with body mass index (BMI) of 27 to 27.9 in adult  Coronary artery disease involving native coronary artery of native heart without angina pectoris Assessment & Plan: Recommend baby aspirin 81 mg once daily.  Check if taking rosuvastatin 40 mg before bed and zetia 10 mg one before bed. Please check to be sure he is taking these as his cholesterol went up quite a bit.       No orders of the defined types were placed in this encounter.   Orders Placed This Encounter  Procedures   Flu Vaccine Trivalent High Dose (Fluad)     Follow-up: No follow-ups on file.   I,Katherina A Bramblett,acting as a scribe for Blane Ohara, MD.,have documented all relevant documentation on the behalf of Blane Ohara, MD,as directed by  Blane Ohara, MD while in the presence of Blane Ohara, MD.   An After Visit Summary was printed and given to the patient.  Blane Ohara, MD [facility] [phone]

## 2023-06-06 ENCOUNTER — Encounter: Payer: Self-pay | Admitting: Family Medicine

## 2023-06-06 ENCOUNTER — Ambulatory Visit (INDEPENDENT_AMBULATORY_CARE_PROVIDER_SITE_OTHER): Payer: PPO | Admitting: Family Medicine

## 2023-06-06 VITALS — BP 130/76 | HR 92 | Temp 97.6°F | Ht 69.0 in | Wt 183.0 lb

## 2023-06-06 DIAGNOSIS — E782 Mixed hyperlipidemia: Secondary | ICD-10-CM | POA: Diagnosis not present

## 2023-06-06 DIAGNOSIS — Z23 Encounter for immunization: Secondary | ICD-10-CM | POA: Diagnosis not present

## 2023-06-06 DIAGNOSIS — I2510 Atherosclerotic heart disease of native coronary artery without angina pectoris: Secondary | ICD-10-CM | POA: Diagnosis not present

## 2023-06-06 DIAGNOSIS — E663 Overweight: Secondary | ICD-10-CM | POA: Diagnosis not present

## 2023-06-06 DIAGNOSIS — N401 Enlarged prostate with lower urinary tract symptoms: Secondary | ICD-10-CM

## 2023-06-06 DIAGNOSIS — Z794 Long term (current) use of insulin: Secondary | ICD-10-CM

## 2023-06-06 DIAGNOSIS — Z6827 Body mass index (BMI) 27.0-27.9, adult: Secondary | ICD-10-CM | POA: Diagnosis not present

## 2023-06-06 DIAGNOSIS — I119 Hypertensive heart disease without heart failure: Secondary | ICD-10-CM

## 2023-06-06 DIAGNOSIS — E1142 Type 2 diabetes mellitus with diabetic polyneuropathy: Secondary | ICD-10-CM | POA: Diagnosis not present

## 2023-06-06 DIAGNOSIS — N138 Other obstructive and reflux uropathy: Secondary | ICD-10-CM | POA: Diagnosis not present

## 2023-06-06 NOTE — Patient Instructions (Addendum)
Recommend call [facility] care to schedule your diabetic eye appointment.  Recommend the following vaccines (Tetanus -TDAP) and Shingrix series.  Recommend baby aspirin 81 mg once daily.   Check if taking rosuvastatin 40 mg before bed and zetia 10 mg one before bed. Please check to be sure he is taking these as his cholesterol went up quite a bit.   Recommend continue to work on eating healthy diet and exercise.

## 2023-06-08 NOTE — Progress Notes (Signed)
No show

## 2023-06-09 DIAGNOSIS — E663 Overweight: Secondary | ICD-10-CM | POA: Insufficient documentation

## 2023-06-09 DIAGNOSIS — Z23 Encounter for immunization: Secondary | ICD-10-CM | POA: Insufficient documentation

## 2023-06-09 DIAGNOSIS — Z6827 Body mass index (BMI) 27.0-27.9, adult: Secondary | ICD-10-CM | POA: Insufficient documentation

## 2023-06-09 NOTE — Assessment & Plan Note (Signed)
Recommend baby aspirin 81 mg once daily.  Check if taking rosuvastatin 40 mg before bed and zetia 10 mg one before bed. Please check to be sure he is taking these as his cholesterol went up quite a bit.

## 2023-07-24 ENCOUNTER — Telehealth: Payer: Self-pay

## 2023-07-24 NOTE — Telephone Encounter (Addendum)
Tried to call patient regarding 2025 PAP Re-enrollment for medication assistance for Myrbetri, Lantus, Farxiga and Ozempic.  Per EPIC patient stopped taking Lantus and Myrbetriq was dc'ed, so verifying which meds need to be re-enrolled for 2025 PAP.

## 2023-08-13 NOTE — Telephone Encounter (Signed)
 PAP: Application for Ozempic  Fiasp  has been submitted to PAP Companies: [employer], online    Please note provider portion has been faxed to Dr. Abigail Free for review  PAP: PAP application for Farxiga , ([employer] ([employer])) has been mailed to pt's home address on file. Will fax provider portion of application to provider's office when pt's portion is received.

## 2023-08-15 NOTE — Telephone Encounter (Signed)
 Received provider forms for 2025 pap re-enrollment for Ozempic and Candie Mile, submitted completes form and insurance card to [employer]

## 2023-08-19 NOTE — Telephone Encounter (Addendum)
 PAP: Patient assistance application for Fiasp  and Ozempic  has been approved by PAP Companies: [employer] from 08/16/2023 to 08/09/2024. Medication should be delivered to PAP Delivery: Provider's office For further shipping updates, please contact [employer] at [phone] Pt ID is: [id number]

## 2023-08-22 ENCOUNTER — Other Ambulatory Visit: Payer: Self-pay | Admitting: Family Medicine

## 2023-08-22 DIAGNOSIS — E1142 Type 2 diabetes mellitus with diabetic polyneuropathy: Secondary | ICD-10-CM

## 2023-08-22 MED ORDER — OZEMPIC (2 MG/DOSE) 8 MG/3ML ~~LOC~~ SOPN: 2.0000 mg | PEN_INJECTOR | SUBCUTANEOUS | 0 refills | Status: DC

## 2023-08-22 MED ORDER — GABAPENTIN 300 MG PO CAPS: 300.0000 mg | ORAL_CAPSULE | Freq: Three times a day (TID) | ORAL | 1 refills | Status: DC

## 2023-08-22 MED ORDER — FIASP FLEXTOUCH 100 UNIT/ML ~~LOC~~ SOPN: 16.0000 [IU] | PEN_INJECTOR | Freq: Two times a day (BID) | SUBCUTANEOUS | 1 refills | Status: DC

## 2023-08-22 NOTE — Telephone Encounter (Signed)
 Copied from CRM [id number]. Topic: Clinical - Medication Refill >> Aug 22, 2023 10:32 AM Wyona SQUIBB wrote: Most Recent Primary Care Visit:  Provider: [facility]  [facility]  Visit Type: OFFICE VISIT  Date: 06/06/2023  Medication: Semaglutide , 2 MG/DOSE, (OZEMPIC , 2 MG/DOSE,) 8 MG/3ML SOPN  Has the patient contacted their pharmacy? Yes (Agent: If no, request that the patient contact the pharmacy for the refill. If patient does not wish to contact the pharmacy document the reason why and proceed with request.) (Agent: If yes, when and what did the pharmacy advise?)  Is this the correct pharmacy for this prescription? Yes If no, delete pharmacy and type the correct one.  This is the patient's preferred pharmacy:   [facility]) - [place]  [place], [place] - [address] [place] [address] Phone: [phone] Fax: [phone]   Has the prescription been filled recently? No  Is the patient out of the medication? Yes  Has the patient been seen for an appointment in the last year OR does the patient have an upcoming appointment? Yes  Can we respond through MyChart? No  Agent: Please be advised that Rx refills may take up to 3 business days. We ask that you follow-up with your pharmacy.   Chief Complaint: Medication Refill Disposition: [] ED /[] Urgent Care (no appt availability in office) / [] Appointment(In office/virtual)/ []  [facility]/ [] Home Care/ [] Refused Recommended Disposition /[] [facility]/ [x]  Follow-up with PCP Additional Notes: PC is a 73 year old male needing a refill of his Semaglutide  (OZEMPIC ) and insulin  Aspart. Spoke with his wife Boruch Manuele.

## 2023-08-22 NOTE — Telephone Encounter (Signed)
 Copied from CRM [id number]. Topic: Clinical - Medication Question >> Aug 22, 2023 10:31 AM Ivette P wrote: Reason for CRM: Pt is reqeuesting a call about Semaglutide, 2 MG/DOSE, (OZEMPIC, 2 MG/DOSE,) 8 MG/3ML SOPN. Call back # [phone]

## 2023-08-22 NOTE — Telephone Encounter (Signed)
 Copied from CRM [id number]. Topic: Clinical - Medication Refill >> Aug 22, 2023 10:32 AM Wyona SQUIBB wrote: Most Recent Primary Care Visit:  Provider: [facility], KIRSTEN  [facility]  Visit Type: OFFICE VISIT  Date: 06/06/2023  Medication: Semaglutide , 2 MG/DOSE, (OZEMPIC , 2 MG/DOSE,) 8 MG/3ML SOPN  Has the patient contacted their pharmacy? Yes (Agent: If no, request that the patient contact the pharmacy for the refill. If patient does not wish to contact the pharmacy document the reason why and proceed with request.) (Agent: If yes, when and what did the pharmacy advise?)  Is this the correct pharmacy for this prescription? Yes If no, delete pharmacy and type the correct one.  This is the patient's preferred pharmacy:   [facility]) - [place]  [place], [place] - [address] [place] [address] Phone: [phone] Fax: [phone]   Has the prescription been filled recently? No  Is the patient out of the medication? Yes  Has the patient been seen for an appointment in the last year OR does the patient have an upcoming appointment? Yes  Can we respond through MyChart? No  Agent: Please be advised that Rx refills may take up to 3 business days. We ask that you follow-up with your pharmacy.

## 2023-08-22 NOTE — Telephone Encounter (Signed)
 Copied from CRM [id number]. Topic: Clinical - Medication Refill >> Aug 22, 2023 10:25 AM Joshua Nelson wrote: Most Recent Primary Care Visit:  Provider: [facility]  [facility]  Visit Type: OFFICE VISIT  Date: 06/06/2023  Medication:gabapentin  (NEURONTIN ) 300 MG capsule   Has the patient contacted their pharmacy? Yes (Agent: If no, request that the patient contact the pharmacy for the refill. If patient does not wish to contact the pharmacy document the reason why and proceed with request.) (Agent: If yes, when and what did the pharmacy advise?)  Is this the correct pharmacy for this prescription? Yes If no, delete pharmacy and type the correct one.  This is the patient's preferred pharmacy:  [facility] [address] - [place], [place] - [address] [place] [address] Phone: [phone] Fax: [phone]   Has the prescription been filled recently? No  Is the patient out of the medication? Yes  Has the patient been seen for an appointment in the last year OR does the patient have an upcoming appointment? Yes  Can we respond through MyChart? No  Agent: Please be advised that Rx refills may take up to 3 business days. We ask that you follow-up with your pharmacy.

## 2023-08-22 NOTE — Telephone Encounter (Signed)
 Copied from CRM [id number]. Topic: Clinical - Medication Refill >> Aug 22, 2023 10:25 AM Wyona SQUIBB wrote: Most Recent Primary Care Visit:  Provider: [facility]  [facility]  Visit Type: OFFICE VISIT  Date: 06/06/2023  Medication:gabapentin  (NEURONTIN ) 300 MG capsule   Has the patient contacted their pharmacy? Yes (Agent: If no, request that the patient contact the pharmacy for the refill. If patient does not wish to contact the pharmacy document the reason why and proceed with request.) (Agent: If yes, when and what did the pharmacy advise?)  Is this the correct pharmacy for this prescription? Yes If no, delete pharmacy and type the correct one.  This is the patient's preferred pharmacy:  [facility] [address] - [place], [place] - [address] [place] [address] Phone: [phone] Fax: [phone]   Has the prescription been filled recently? No  Is the patient out of the medication? Yes  Has the patient been seen for an appointment in the last year OR does the patient have an upcoming appointment? Yes  Can we respond through MyChart? No  Agent: Please be advised that Rx refills may take up to 3 business days. We ask that you follow-up with your pharmacy.

## 2023-08-26 DIAGNOSIS — R351 Nocturia: Secondary | ICD-10-CM | POA: Diagnosis not present

## 2023-08-26 DIAGNOSIS — C61 Malignant neoplasm of prostate: Secondary | ICD-10-CM | POA: Diagnosis not present

## 2023-08-26 DIAGNOSIS — N401 Enlarged prostate with lower urinary tract symptoms: Secondary | ICD-10-CM | POA: Diagnosis not present

## 2023-08-26 DIAGNOSIS — R3915 Urgency of urination: Secondary | ICD-10-CM | POA: Diagnosis not present

## 2023-09-02 ENCOUNTER — Other Ambulatory Visit: Payer: Self-pay | Admitting: Family Medicine

## 2023-09-02 ENCOUNTER — Encounter: Payer: Self-pay | Admitting: Podiatry

## 2023-09-02 ENCOUNTER — Ambulatory Visit (INDEPENDENT_AMBULATORY_CARE_PROVIDER_SITE_OTHER): Payer: PPO | Admitting: Podiatry

## 2023-09-02 DIAGNOSIS — E1142 Type 2 diabetes mellitus with diabetic polyneuropathy: Secondary | ICD-10-CM

## 2023-09-02 DIAGNOSIS — B351 Tinea unguium: Secondary | ICD-10-CM

## 2023-09-02 DIAGNOSIS — M79675 Pain in left toe(s): Secondary | ICD-10-CM | POA: Diagnosis not present

## 2023-09-02 DIAGNOSIS — I119 Hypertensive heart disease without heart failure: Secondary | ICD-10-CM

## 2023-09-02 DIAGNOSIS — M79674 Pain in right toe(s): Secondary | ICD-10-CM | POA: Diagnosis not present

## 2023-09-02 NOTE — Progress Notes (Unsigned)
  Subjective:  Patient ID: Joshua Nelson, male    DOB: 1951/07/19,  MRN: [id number]  Chief Complaint  Patient presents with   [facility]    [facility] with last A1c 6.0, ASA 30.    73 y.o. male presents with the above complaint. History confirmed with patient. Patient presenting with pain related to dystrophic thickened elongated nails. Patient is unable to trim own nails related to nail dystrophy and/or mobility issues. Patient does have a history of T2DM with peripheral neuropathy.  Objective:  Physical Exam: warm, good capillary refill,  nail exam onychomycosis of the toenails, onycholysis, and dystrophic nails DP and PT pulses 1/4 bilateral, and protective sensation absent Left Foot:  Pain with palpation of nails due to elongation and dystrophic growth.  Right Foot: Pain with palpation of nails due to elongation and dystrophic growth.   Assessment:   1. Pain due to onychomycosis of toenails of both feet   2. DM type 2 with diabetic peripheral neuropathy (HCC)     Plan:  Patient was evaluated and treated and all questions answered.  #Onychomycosis with pain  -Nails palliatively debrided as below. -Educated on self-care  Procedure: Nail Debridement Rationale: Pain Type of Debridement: manual, sharp debridement. Instrumentation: Nail nipper, rotary burr. Number of Nails: 10  Return in about 3 months (around 12/01/2023) for Diabetic Foot Care.         Bronwen Betters, DPM [facility] / [facility]

## 2023-09-09 NOTE — Telephone Encounter (Addendum)
Spoke with patient's wife Olegario Messier, Who informs they are still working on completing applications and will get them back as soon as possible

## 2023-09-11 ENCOUNTER — Other Ambulatory Visit (INDEPENDENT_AMBULATORY_CARE_PROVIDER_SITE_OTHER): Payer: PPO

## 2023-09-11 ENCOUNTER — Ambulatory Visit: Payer: PPO | Admitting: Gastroenterology

## 2023-09-11 ENCOUNTER — Encounter: Payer: Self-pay | Admitting: Gastroenterology

## 2023-09-11 VITALS — BP 138/72 | HR 72 | Ht 69.0 in | Wt 186.1 lb

## 2023-09-11 DIAGNOSIS — C61 Malignant neoplasm of prostate: Secondary | ICD-10-CM

## 2023-09-11 DIAGNOSIS — E119 Type 2 diabetes mellitus without complications: Secondary | ICD-10-CM | POA: Diagnosis not present

## 2023-09-11 DIAGNOSIS — K625 Hemorrhage of anus and rectum: Secondary | ICD-10-CM | POA: Diagnosis not present

## 2023-09-11 DIAGNOSIS — Z7985 Long-term (current) use of injectable non-insulin antidiabetic drugs: Secondary | ICD-10-CM

## 2023-09-11 DIAGNOSIS — Z1211 Encounter for screening for malignant neoplasm of colon: Secondary | ICD-10-CM

## 2023-09-11 LAB — CBC WITH DIFFERENTIAL/PLATELET
Basophils Absolute: 0.1 10*3/uL (ref 0.0–0.1)
Basophils Relative: 0.8 % (ref 0.0–3.0)
Eosinophils Absolute: 0.4 10*3/uL (ref 0.0–0.7)
Eosinophils Relative: 5.4 % — ABNORMAL HIGH (ref 0.0–5.0)
HCT: 33.3 % — ABNORMAL LOW (ref 39.0–52.0)
Hemoglobin: 11.2 g/dL — ABNORMAL LOW (ref 13.0–17.0)
Lymphocytes Relative: 20.2 % (ref 12.0–46.0)
Lymphs Abs: 1.7 10*3/uL (ref 0.7–4.0)
MCHC: 33.7 g/dL (ref 30.0–36.0)
MCV: 88.7 fL (ref 78.0–100.0)
Monocytes Absolute: 0.6 10*3/uL (ref 0.1–1.0)
Monocytes Relative: 6.7 % (ref 3.0–12.0)
Neutro Abs: 5.5 10*3/uL (ref 1.4–7.7)
Neutrophils Relative %: 66.9 % (ref 43.0–77.0)
Platelets: 206 10*3/uL (ref 150.0–400.0)
RBC: 3.76 Mil/uL — ABNORMAL LOW (ref 4.22–5.81)
RDW: 13.9 % (ref 11.5–15.5)
WBC: 8.3 10*3/uL (ref 4.0–10.5)

## 2023-09-11 LAB — COMPREHENSIVE METABOLIC PANEL
ALT: 11 U/L (ref 0–53)
AST: 14 U/L (ref 0–37)
Albumin: 4.4 g/dL (ref 3.5–5.2)
Alkaline Phosphatase: 62 U/L (ref 39–117)
BUN: 11 mg/dL (ref 6–23)
CO2: 28 meq/L (ref 19–32)
Calcium: 9.2 mg/dL (ref 8.4–10.5)
Chloride: 105 meq/L (ref 96–112)
Creatinine, Ser: 0.76 mg/dL (ref 0.40–1.50)
GFR: 91.09 mL/min (ref >60.00)
Glucose, Bld: 95 mg/dL (ref 70–99)
Potassium: 3.9 meq/L (ref 3.5–5.1)
Sodium: 140 meq/L (ref 135–145)
Total Bilirubin: 0.5 mg/dL (ref 0.2–1.2)
Total Protein: 7.1 g/dL (ref 6.0–8.3)

## 2023-09-11 MED ORDER — NA SULFATE-K SULFATE-MG SULF 17.5-3.13-1.6 GM/177ML PO SOLN: 1.0000 | Freq: Once | ORAL | 0 refills | Status: AC

## 2023-09-11 NOTE — Progress Notes (Signed)
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  History of Present Illness   The patient presents with rectal bleeding. He is accompanied by his wife, Olegario Messier.   He has been experiencing rectal bleeding for the past one to two months. The bleeding is not associated with every bowel movement but occurs at least two to three times a week. It is described as both bright red and dark red, sometimes occurring without a bowel movement, resembling urination. The bleeding can be mixed with stool or occur independently. No nausea, vomiting, or unintentional weight loss. He has not had a colonoscopy before. No family history of colon cancer.  He has experienced weight loss due to the use of Ozempic for diabetes management, which has curbed his appetite. He reports normal bowel movements without difficulty. Ozempic is currently out of stock, and he has not taken it for three weeks.  He has a history of prostate cancer, with a previous diagnosis indicating a 10% involvement, which has decreased to 2% based on recent blood work. He has not undergone radiation or chemotherapy for prostate cancer.  He has a history of overactive bladder and is currently trying a new medication, Myrbetriq, to manage symptoms, which include frequent nighttime urination.  He has a history of a stroke approximately five years ago and is on a baby aspirin regimen. No recent strokes or heart attacks within the last year.     Denies abdominal pain, change in bowel habits, nausea, vomiting, rectal pain.  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   PONV (postoperative nausea and vomiting)    Prostate cancer (HCC) 07/2021    Past Surgical History:  Procedure Laterality Date   CYSTOSCOPY WITH URETHRAL DILATATION N/A 08/01/2021   Procedure: CYSTOSCOPY WITH BALLOON URETHRAL DILATATION;  Surgeon: Noel Christmas, MD;  Location: [facility];  Service: Urology;  Laterality: N/A;   heart stents     x 3   left cataract  10/2019   nose surgery for dog bite as a kid      right cataract Right 08/2019   TRANSURETHRAL RESECTION OF BLADDER TUMOR N/A 08/01/2021   Procedure: TRANSURETHRAL RESECTION OF PROSTATE;  Surgeon: Noel Christmas, MD;  Location: [facility];  Service: Urology;  Laterality: N/A;  90 MINS    Current Outpatient Medications  Medication Sig Dispense Refill   Continuous Glucose Sensor (FREESTYLE LIBRE 2 SENSOR) MISC Change sensor every 10 days as directed, 2 each 5   ezetimibe (ZETIA) 10 MG tablet TAKE ONE (1) TABLET ONCE DAILY 90 tablet 1   FARXIGA 10 MG TABS tablet TAKE 1 TABLET BY MOUTH ONCE DAILY IN THE MORNING AS DIRECTED. 90 tablet 1   gabapentin (NEURONTIN) 300 MG capsule Take 1 capsule (300 mg total) by mouth 3 (three) times daily. 270 capsule 1   insulin aspart (FIASP FLEXTOUCH) 100 UNIT/ML FlexTouch Pen Inject  16 Units into the skin 2 (two) times daily. With breakfast and supper 30 mL 1   LANTUS SOLOSTAR 100 UNIT/ML Solostar Pen Inject 65 Units into the skin daily. Pt takes in the pm per wife 30 mL 1   lisinopril (ZESTRIL) 5 MG tablet Once daily in the am 90 tablet 0   Meclizine HCl 25 MG CHEW TAKE 1 TABLET THREE TIMES DAILY AS NEEDED FOR DIZZINESS 30 tablet 0   metoprolol succinate (TOPROL-XL) 25 MG 24 hr tablet TAKE 1 TABLET BY MOUTH ONCE DAILY IN THE MORNING. 90 tablet  0   rosuvastatin (CRESTOR) 40 MG tablet Take 1 tablet (40 mg total) by mouth at bedtime. 90 tablet 1   Semaglutide, 2 MG/DOSE, (OZEMPIC, 2 MG/DOSE,) 8 MG/3ML SOPN Inject 2 mg into the skin once a week. 9 mL 0   No current facility-administered medications for this visit.    Allergies as of 09/11/2023   (No Known Allergies)    Family History  Problem Relation Age of Onset   Endometrial cancer Mother    CAD Mother    AAA (abdominal aortic aneurysm) Mother    Healthy Father    Heart attack Brother    Colon cancer Neg Hx    Esophageal cancer Neg Hx    Liver cancer Neg Hx    Stomach cancer Neg Hx     Social History   Socioeconomic History   Marital status: Married    Spouse name: Olegario Messier   Number of children: 1   Years of education: 12   Highest education level: High school graduate  Occupational History   Occupation: Retired  Tobacco Use   Smoking status: Former    Current packs/day: 0.00    Types: Cigarettes    Quit date: 2003    Years since quitting: 22.0   Smokeless tobacco: Never  Vaping Use   Vaping status: Never Used  Substance and Sexual Activity   Alcohol use: Not Currently    Comment: none since 2000   Drug use: Never   Sexual activity: Not on file  Other Topics Concern   Not on file  Social History Narrative   Lives at home with his wife.   2 cups caffeine per day.   Right-handed.   Social Drivers of Corporate investment banker Strain: Low Risk  (03/21/2023)   Overall Financial Resource Strain (CARDIA)    Difficulty of Paying Living Expenses: Not hard at all  Food Insecurity: No Food Insecurity (03/21/2023)   Hunger Vital Sign    Worried About Running Out of Food in the Last Year: Never true    Ran Out of Food in the Last Year: Never true  Transportation Needs: No Transportation Needs (03/21/2023)   PRAPARE - Administrator, Civil Service (Medical): No    Lack of Transportation (Non-Medical): No  Physical Activity: Insufficiently Active  (03/21/2023)   Exercise Vital Sign    Days of Exercise per Week: 7 days    Minutes of Exercise per Session: 20 min  Stress: No Stress Concern Present (03/21/2023)   [employer] of Occupational Health - Occupational Stress Questionnaire    Feeling of Stress : Not at all  Social Connections: Socially Integrated (03/21/2023)   Social Connection and Isolation Panel [NHANES]    Frequency of Communication with Friends and Family: More than three times a week    Frequency of Social Gatherings with Friends and Family: More than three times a week    Attends  Religious Services: More than 4 times per year    Active Member of Clubs or Organizations: Yes    Attends Banker Meetings: More than 4 times per year    Marital Status: Married  Catering manager Violence: Not At Risk (03/21/2023)   Humiliation, Afraid, Rape, and Kick questionnaire    Fear of Current or Ex-Partner: No    Emotionally Abused: No    Physically Abused: No    Sexually Abused: No    Review of Systems:    Constitutional: No weight loss, fever, chills, weakness or fatigue HEENT: Eyes: No change in vision               Ears, Nose, Throat:  No change in hearing or congestion Skin: No rash or itching Cardiovascular: No chest pain, chest pressure or palpitations   Respiratory: No SOB or cough Gastrointestinal: See HPI and otherwise negative Genitourinary: No dysuria or change in urinary frequency Neurological: No headache, dizziness or syncope Musculoskeletal: No new muscle or joint pain Hematologic: No bleeding or bruising Psychiatric: No history of depression or anxiety    Physical Exam:  Vital signs: BP 138/72 (BP Location: Left Arm, Patient Position: Sitting, Cuff Size: Normal)   Pulse 72   Ht 5\' 9"  (1.753 m)   Wt 186 lb 2 oz (84.4 kg)   BMI 27.49 kg/m   Constitutional: NAD, Well developed, Well nourished, alert and cooperative Head:  Normocephalic and atraumatic. Eyes:   PEERL, EOMI. No icterus.  Conjunctiva pink. Respiratory: Respirations even and unlabored. Lungs clear to auscultation bilaterally.   No wheezes, crackles, or rhonchi.  Cardiovascular:  Regular rate and rhythm. No peripheral edema, cyanosis or pallor.  Gastrointestinal:  Soft, nondistended, nontender. No rebound or guarding. Normal bowel sounds. No appreciable masses or hepatomegaly. Rectal:  Not performed.  Patient declined, deferred to colonoscopy Msk:  Symmetrical without gross deformities. Without edema, no deformity or joint abnormality.  Neurologic:  Alert and  oriented x4;  grossly normal neurologically.  Skin:   Dry and intact without significant lesions or rashes. Psychiatric: Oriented to person, place and time. Demonstrates good judgement and reason without abnormal affect or behaviors.   RELEVANT LABS AND IMAGING: CBC    Component Value Date/Time   WBC 7.3 06/04/2023 0839   WBC 11.0 (H) 07/26/2021 1130   RBC 4.32 06/04/2023 0839   RBC 4.71 07/26/2021 1130   HGB 12.8 (L) 06/04/2023 0839   HCT 38.6 06/04/2023 0839   PLT 191 06/04/2023 0839   MCV 89 06/04/2023 0839   MCH 29.6 06/04/2023 0839   MCH 28.9 07/26/2021 1130   MCHC 33.2 06/04/2023 0839   MCHC 32.8 07/26/2021 1130   RDW 12.5 06/04/2023 0839   LYMPHSABS 1.4 06/04/2023 0839   MONOABS 0.8 12/07/2014 1805   EOSABS 0.4 06/04/2023 0839   BASOSABS 0.1 06/04/2023 0839    CMP     Component Value Date/Time   NA 139 06/04/2023 0839   K 4.9 06/04/2023 0839   CL 102 06/04/2023 0839   CO2 24 06/04/2023 0839   GLUCOSE 104 (H) 06/04/2023 0839   GLUCOSE 168 (H) 07/26/2021 1130   BUN 10 06/04/2023 0839   CREATININE 0.76 06/04/2023 0839   CALCIUM 9.6 06/04/2023 0839   PROT 6.9 06/04/2023 0839   ALBUMIN 4.5 06/04/2023 0839   AST 13 06/04/2023 0839   ALT 11 06/04/2023 0839   ALKPHOS 72 06/04/2023 0839   BILITOT 0.5 06/04/2023 0839   GFRNONAA >60 07/26/2021 1130   GFRAA  105 06/28/2020 0930     Assessment/Plan:      Rectal Bleeding New  onset, intermittent, painless, bright and dark red blood per rectum for the past 1-2 months. Occurs independent of bowel movements. No associated nausea, vomiting, or unintentional weight loss. No prior colonoscopy. Differential includes hemorrhoids, polyps, diverticular bleed, or colonic inflammation. - Colonoscopy for further evaluation - I thoroughly discussed the procedure with the patient (at bedside) to include nature of the procedure, alternatives, benefits, and risks (including but not limited to bleeding, infection, perforation, anesthesia/cardiac pulmonary complications).  Patient verbalized understanding and gave verbal consent to proceed with procedure. - CBC and CMP - Management per colonoscopy findings  Type 2 Diabetes On Ozempic, which has led to weight loss. Currently out of medication for the past 3 weeks. - hold for colonoscopy  Prostate Cancer Stable. No history of radiation therapy.  Lara Mulch [facility] 09/11/2023, 2:45 PM  Cc: Blane Ohara, MD

## 2023-09-11 NOTE — Patient Instructions (Signed)
Your provider has requested that you go to the basement level for lab work before leaving today. Press "B" on the elevator. The lab is located at the first door on the left as you exit the elevator.  You have been scheduled for a colonoscopy. Please follow written instructions given to you at your visit today.   Please pick up your prep supplies at the pharmacy within the next 1-3 days.  If you use inhalers (even only as needed), please bring them with you on the day of your procedure.  DO NOT TAKE 7 DAYS PRIOR TO TEST- Trulicity (dulaglutide) Ozempic, Wegovy (semaglutide) Mounjaro (tirzepatide) Bydureon Bcise (exanatide extended release)  DO NOT TAKE 1 DAY PRIOR TO YOUR TEST Rybelsus (semaglutide) Adlyxin (lixisenatide) Victoza (liraglutide) Byetta (exanatide) ___________________________________________________________________________  We have sent the following medications to your pharmacy for you to pick up at your convenience:  Suprep  _______________________________________________________  If your blood pressure at your visit was 140/90 or greater, please contact your primary care physician to follow up on this.  _______________________________________________________  If you are age 79 or older, your body mass index should be between 23-30. Your Body mass index is 27.49 kg/m. If this is out of the aforementioned range listed, please consider follow up with your Primary Care Provider.  If you are age 67 or younger, your body mass index should be between 19-25. Your Body mass index is 27.49 kg/m. If this is out of the aformentioned range listed, please consider follow up with your Primary Care Provider.   ________________________________________________________  The [facility] providers would like to encourage you to use [facility] to communicate with providers for non-urgent requests or questions.  Due to long hold times on the telephone, sending your provider a message by  [facility] may be a faster and more efficient way to get a response.  Please allow 48 business hours for a response.  Please remember that this is for non-urgent requests.  _______________________________________________________  Due to recent changes in healthcare laws, you may see the results of your imaging and laboratory studies on MyChart before your provider has had a chance to review them.  We understand that in some cases there may be results that are confusing or concerning to you. Not all laboratory results come back in the same time frame and the provider may be waiting for multiple results in order to interpret others.  Please give [place] 48 hours in order for your provider to thoroughly review all the results before contacting the office for clarification of your results.   Thank you for trusting me with your gastrointestinal care!   Boone Master, PA

## 2023-09-11 NOTE — Progress Notes (Signed)
I agree with the assessment and plan as outlined by Ms. McMichael.

## 2023-09-16 ENCOUNTER — Other Ambulatory Visit: Payer: Self-pay

## 2023-09-16 ENCOUNTER — Other Ambulatory Visit: Payer: PPO

## 2023-09-16 DIAGNOSIS — Z794 Long term (current) use of insulin: Secondary | ICD-10-CM

## 2023-09-16 DIAGNOSIS — E782 Mixed hyperlipidemia: Secondary | ICD-10-CM | POA: Diagnosis not present

## 2023-09-16 DIAGNOSIS — E1142 Type 2 diabetes mellitus with diabetic polyneuropathy: Secondary | ICD-10-CM | POA: Diagnosis not present

## 2023-09-16 DIAGNOSIS — K625 Hemorrhage of anus and rectum: Secondary | ICD-10-CM

## 2023-09-16 DIAGNOSIS — I119 Hypertensive heart disease without heart failure: Secondary | ICD-10-CM

## 2023-09-16 DIAGNOSIS — N401 Enlarged prostate with lower urinary tract symptoms: Secondary | ICD-10-CM

## 2023-09-17 ENCOUNTER — Encounter: Payer: Self-pay | Admitting: Family Medicine

## 2023-09-17 ENCOUNTER — Telehealth: Payer: Self-pay

## 2023-09-17 ENCOUNTER — Ambulatory Visit (INDEPENDENT_AMBULATORY_CARE_PROVIDER_SITE_OTHER): Payer: PPO | Admitting: Family Medicine

## 2023-09-17 VITALS — BP 130/78 | Temp 97.4°F | Ht 69.0 in | Wt 189.0 lb

## 2023-09-17 DIAGNOSIS — E1142 Type 2 diabetes mellitus with diabetic polyneuropathy: Secondary | ICD-10-CM | POA: Diagnosis not present

## 2023-09-17 DIAGNOSIS — I119 Hypertensive heart disease without heart failure: Secondary | ICD-10-CM

## 2023-09-17 DIAGNOSIS — E782 Mixed hyperlipidemia: Secondary | ICD-10-CM

## 2023-09-17 DIAGNOSIS — K625 Hemorrhage of anus and rectum: Secondary | ICD-10-CM

## 2023-09-17 DIAGNOSIS — I2510 Atherosclerotic heart disease of native coronary artery without angina pectoris: Secondary | ICD-10-CM | POA: Diagnosis not present

## 2023-09-17 DIAGNOSIS — Z794 Long term (current) use of insulin: Secondary | ICD-10-CM | POA: Diagnosis not present

## 2023-09-17 DIAGNOSIS — Z6827 Body mass index (BMI) 27.0-27.9, adult: Secondary | ICD-10-CM | POA: Diagnosis not present

## 2023-09-17 DIAGNOSIS — C61 Malignant neoplasm of prostate: Secondary | ICD-10-CM

## 2023-09-17 DIAGNOSIS — N3941 Urge incontinence: Secondary | ICD-10-CM

## 2023-09-17 LAB — LIPID PANEL
Chol/HDL Ratio: 2.9 {ratio} (ref 0.0–5.0)
Cholesterol, Total: 130 mg/dL (ref 100–199)
HDL: 45 mg/dL (ref >39)
LDL Chol Calc (NIH): 73 mg/dL (ref 0–99)
Triglycerides: 56 mg/dL (ref 0–149)
VLDL Cholesterol Cal: 12 mg/dL (ref 5–40)

## 2023-09-17 LAB — MICROALBUMIN / CREATININE URINE RATIO
Creatinine, Urine: 70.7 mg/dL
Microalb/Creat Ratio: 11 mg/g{creat} (ref 0–29)
Microalbumin, Urine: 7.7 ug/mL

## 2023-09-17 LAB — TSH: TSH: 1.18 u[IU]/mL (ref 0.450–4.500)

## 2023-09-17 LAB — HEMOGLOBIN A1C
Est. average glucose Bld gHb Est-mCnc: 123 mg/dL
Hgb A1c MFr Bld: 5.9 % — ABNORMAL HIGH (ref 4.8–5.6)

## 2023-09-17 NOTE — Assessment & Plan Note (Signed)
Well controlled.  No changes to medicines. Crestor 40 mg  take 1 tablet daily, Zetia 10 mg take 1 tablet daily.  Continue to work on eating a healthy diet and exercise.

## 2023-09-17 NOTE — Telephone Encounter (Signed)
 Called and left detailed message for patient's wife informing them that we have received his patient assistance for Ozempic  2 mg 4 pens and 2 boxes novofine needles.  Ozempic  2 mg  4 BOXES NDC: 9830-5227-87 Lot #[id number] EXP:04-12-2026   NOVOFINE NEEDLES 32G  2 BOXES NDC: [address]-10 LOT#[id number] EXP DATE: 02-10-2028

## 2023-09-17 NOTE — Assessment & Plan Note (Addendum)
 Control: Well controlled Recommend check sugars fasting daily. Recommend check feet daily. Recommend annual eye exams. Medicines: Farxiga  10 mg take 1 tablet daily, Ozempic  2 mg weekly. I plan to have he and his wife meet with our diabetes educator and review all his medicines. I asked he bring all the bottles.   Continue to work on eating a healthy diet and exercise.  Labs reviewed today.  Recommend call [facility] care to schedule your diabetic eye appointment.  Recommend the following vaccines (Tetanus -TDAP) and Shingrix series.

## 2023-09-17 NOTE — Progress Notes (Signed)
 Subjective:  Patient ID: Joshua Nelson, male    DOB: [birth year]  Age: 73 y.o. MRN: [id number]  Chief Complaint  Patient presents with   Medical Management of Chronic Issues    HPI   Diabetes:  Complications: hyperlipidemia, hypertension, and neuropathy  Glucose checking:  patient has Freestyle Libre 2.  Glucose logs: 61-216 Hypoglycemia: once about a week ago.  Most recent A1C: 5.9 Current medications: Farxiga  10 mg take 1 tablet daily, is not taking Lantus  inject 35 units daily.  Patient is taking ozempic  2 mg weekly, Gabapentin  300 mg THREE TIMES A DAY.  Taking fiasp  when feels he needs it. It is very unclear what he is doing. His wife helps with thi.  Not taking aspirin. Last Eye Exam: overdue. Foot checks: Daily  Prostate cancer: saw [facility] in mid January. Psa 0.3. active surveillance.   Patient complained to urologist about rectal bleeding 2-3 times per week over the last couple of months.  Urology referred to gastroenterology who he saw on September 11, 2023.  He is scheduled to undergo colonoscopy towards the end of February.  Hyperlipidemia: Current medications:  Crestor  40 mg  take 1 tablet daily, Zetia  10 mg take 1 tablet daily.    Hypertensive with heart disease:: Current medications: Metoprolol  25 mg take 1 tablet daily, Lisinopril  5 mg  mg take 1 tablet daily   Diet: Fairly healthy most of the time.   Exercise: some walking.           03/21/2023    8:49 AM 02/13/2023   11:27 AM 07/27/2022   11:21 AM 07/09/2022    2:28 PM 04/05/2022    1:36 PM  Depression screen PHQ 2/9  Decreased Interest 0 2 0 0 0  Down, Depressed, Hopeless 0 0 0 0 0  PHQ - 2 Score 0 2 0 0 0  Altered sleeping 0 0     Tired, decreased energy 0 1     Change in appetite 0 0     Feeling bad or failure about yourself  0 0     Trouble concentrating 0 0     Moving slowly or fidgety/restless 0 0     Suicidal thoughts 0 0     PHQ-9 Score 0 3     Difficult doing work/chores Not  difficult at all Not difficult at all           09/21/2023    4:55 PM  Fall Risk   Falls in the past year? 0  Number falls in past yr: 0  Injury with Fall? 0  Risk for fall due to : No Fall Risks  Follow up Falls evaluation completed    Patient Care Team: Sherre Clapper, MD as PCP - General (Family Medicine) Elisabeth Valli BIRCH, MD as Consulting Physician (Urology) Corlis Sharlet PARAS, RN as Case Manager (General Practice)   Review of Systems  Constitutional:  Negative for chills, diaphoresis, fatigue and fever.  HENT:  Negative for congestion, ear pain and sore throat.   Respiratory:  Negative for cough and shortness of breath.   Cardiovascular:  Negative for chest pain and leg swelling.  Gastrointestinal:  Negative for abdominal pain, constipation, diarrhea, nausea and vomiting.  Genitourinary:  Negative for dysuria and urgency.  Musculoskeletal:  Negative for arthralgias and myalgias.  Neurological:  Negative for dizziness and headaches.  Psychiatric/Behavioral:  Negative for dysphoric mood.     Current Outpatient Medications on File Prior to Visit  Medication Sig Dispense  Refill   Continuous Glucose Sensor (FREESTYLE LIBRE 2 SENSOR) MISC Change sensor every 10 days as directed, 2 each 5   ezetimibe  (ZETIA ) 10 MG tablet TAKE ONE (1) TABLET ONCE DAILY 90 tablet 1   FARXIGA  10 MG TABS tablet TAKE 1 TABLET BY MOUTH ONCE DAILY IN THE MORNING AS DIRECTED. 90 tablet 1   gabapentin  (NEURONTIN ) 300 MG capsule Take 1 capsule (300 mg total) by mouth 3 (three) times daily. 270 capsule 1   insulin  aspart (FIASP  FLEXTOUCH) 100 UNIT/ML FlexTouch Pen Inject 16 Units into the skin 2 (two) times daily. With breakfast and supper 30 mL 1   LANTUS  SOLOSTAR 100 UNIT/ML Solostar Pen Inject 65 Units into the skin daily. Pt takes in the pm per wife 30 mL 1   lisinopril  (ZESTRIL ) 5 MG tablet Once daily in the am 90 tablet 0   Meclizine  HCl 25 MG CHEW TAKE 1 TABLET THREE TIMES DAILY AS NEEDED FOR DIZZINESS 30  tablet 0   metoprolol  succinate (TOPROL -XL) 25 MG 24 hr tablet TAKE 1 TABLET BY MOUTH ONCE DAILY IN THE MORNING. 90 tablet 0   MYRBETRIQ  50 MG TB24 tablet Take 50 mg by mouth daily.     rosuvastatin  (CRESTOR ) 40 MG tablet Take 1 tablet (40 mg total) by mouth at bedtime. 90 tablet 1   Semaglutide , 2 MG/DOSE, (OZEMPIC , 2 MG/DOSE,) 8 MG/3ML SOPN Inject 2 mg into the skin once a week. 9 mL 0   No current facility-administered medications on file prior to visit.   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   PONV (postoperative nausea and vomiting)    Prostate cancer (HCC) 07/2021   Past Surgical History:  Procedure Laterality Date   CYSTOSCOPY WITH URETHRAL DILATATION N/A 08/01/2021   Procedure: CYSTOSCOPY WITH BALLOON URETHRAL DILATATION;  Surgeon: Elisabeth Valli BIRCH, MD;  Location: [facility];  Service: Urology;  Laterality: N/A;   heart stents     x 3   left cataract  10/2019   nose surgery for dog bite as a kid      right cataract Right 08/2019   TRANSURETHRAL RESECTION OF BLADDER TUMOR N/A 08/01/2021   Procedure: TRANSURETHRAL RESECTION OF PROSTATE;  Surgeon: Elisabeth Valli BIRCH, MD;  Location: [facility];  Service: Urology;  Laterality: N/A;  90 MINS    Family History  Problem Relation Age of Onset   Endometrial cancer Mother    CAD Mother    AAA (abdominal aortic aneurysm) Mother    Healthy Father    Heart attack Brother    Colon cancer Neg Hx    Esophageal cancer Neg Hx    Liver cancer Neg Hx    Stomach cancer Neg Hx    Social History   Socioeconomic History   Marital status: Married    Spouse name: Nathanel   Number of children: 1   Years of  education: 12   Highest education level: High school graduate  Occupational History   Occupation: Retired  Tobacco Use   Smoking status: Former    Current packs/day: 0.00    Types: Cigarettes    Quit date: 2003  Years since quitting: 22.1   Smokeless tobacco: Never  Vaping Use   Vaping status: Never Used  Substance and Sexual Activity   Alcohol use: Not Currently    Comment: none since 2000   Drug use: Never   Sexual activity: Not on file  Other Topics Concern   Not on file  Social History Narrative   Lives at home with his wife.   2 cups caffeine per day.   Right-handed.   Social Drivers of Corporate Investment Banker Strain: Low Risk  (03/21/2023)   Overall Financial Resource Strain (CARDIA)    Difficulty of Paying Living Expenses: Not hard at all  Food Insecurity: No Food Insecurity (03/21/2023)   Hunger Vital Sign    Worried About Running Out of Food in the Last Year: Never true    Ran Out of Food in the Last Year: Never true  Transportation Needs: No Transportation Needs (03/21/2023)   PRAPARE - Administrator, Civil Service (Medical): No    Lack of Transportation (Non-Medical): No  Physical Activity: Insufficiently Active (03/21/2023)   Exercise Vital Sign    Days of Exercise per Week: 7 days    Minutes of Exercise per Session: 20 min  Stress: No Stress Concern Present (03/21/2023)   [employer] of Occupational Health - Occupational Stress Questionnaire    Feeling of Stress : Not at all  Social Connections: Socially Integrated (03/21/2023)   Social Connection and Isolation Panel [NHANES]    Frequency of Communication with Friends and Family: More than three times a week    Frequency of Social Gatherings with Friends and Family: More than three times a week    Attends Religious Services: More than 4 times per year    Active Member of [employer] or Organizations: Yes    Attends Engineer, Structural: More than 4 times per year    Marital Status:  Married    Objective:  BP 130/78 (BP Location: Left Arm, Patient Position: Sitting)   Temp (!) 97.4 F (36.3 C) (Temporal)   Ht 5' 9 (1.753 m)   Wt 189 lb (85.7 kg)   SpO2 98%   BMI 27.91 kg/m      09/17/2023   11:56 AM 09/11/2023    1:58 PM 06/06/2023   12:00 PM  BP/Weight  Systolic BP 130 138 130  Diastolic BP 78 72 76  Wt. (Lbs) 189 186.13 183  BMI 27.91 kg/m2 27.49 kg/m2 27.02 kg/m2    Physical Exam Vitals reviewed.  Constitutional:      Appearance: Normal appearance. He is normal weight.  Cardiovascular:     Rate and Rhythm: Normal rate and regular rhythm.     Heart sounds: No murmur heard. Pulmonary:     Effort: Pulmonary effort is normal.     Breath sounds: Normal breath sounds.  Abdominal:     General: Abdomen is flat. Bowel sounds are normal.     Palpations: Abdomen is soft.     Tenderness: There is no abdominal tenderness.  Neurological:     Mental Status: He is alert and oriented to person, place, and time.  Psychiatric:        Mood and Affect: Mood normal.        Behavior: Behavior normal.     Diabetic Foot Exam - Simple   Simple Foot Form Diabetic Foot exam was performed with the following findings: Yes 09/17/2023  4:53 PM  Visual Inspection No deformities, no ulcerations, no other skin  breakdown bilaterally: Yes Sensation Testing See comments: Yes Pulse Check Posterior Tibialis and Dorsalis pulse intact bilaterally: Yes Comments fair sensation on feet      Lab Results  Component Value Date   WBC 8.3 09/11/2023   HGB 11.2 (L) 09/11/2023   HCT 33.3 (L) 09/11/2023   PLT 206.0 09/11/2023   GLUCOSE 95 09/11/2023   CHOL 130 09/16/2023   TRIG 56 09/16/2023   HDL 45 09/16/2023   LDLCALC 73 09/16/2023   ALT 11 09/11/2023   AST 14 09/11/2023   NA 140 09/11/2023   K 3.9 09/11/2023   CL 105 09/11/2023   CREATININE 0.76 09/11/2023   BUN 11 09/11/2023   CO2 28 09/11/2023   TSH 1.180 09/16/2023   HGBA1C 5.9 (H) 09/16/2023   MICROALBUR 150  08/18/2021      Assessment & Plan:    Hypertensive heart disease without heart failure Assessment & Plan: Well controlled.  No changes to medicines.  Metoprolol  25 mg take 1 tablet daily, Lisinopril  5 mg  mg take 1 tablet daily Continue to work on eating a healthy diet and exercise.      Mixed hyperlipidemia Assessment & Plan: Well controlled.  No changes to medicines. Crestor  40 mg  take 1 tablet daily, Zetia  10 mg take 1 tablet daily.  Continue to work on eating a healthy diet and exercise.     Controlled type 2 diabetes mellitus with diabetic polyneuropathy, with long-term current use of insulin  (HCC) Assessment & Plan: Control: Well controlled Recommend check sugars fasting daily. Recommend check feet daily. Recommend annual eye exams. Medicines: Farxiga  10 mg take 1 tablet daily, Ozempic  2 mg weekly. I plan to have he and his wife meet with our diabetes educator and review all his medicines. I asked he bring all the bottles.   Continue to work on eating a healthy diet and exercise.  Labs reviewed today.  Recommend call [facility] care to schedule your diabetic eye appointment.  Recommend the following vaccines (Tetanus -TDAP) and Shingrix series.   Diabetic polyneuropathy associated with type 2 diabetes mellitus (HCC) Assessment & Plan: Control: Well controlled Recommend check sugars fasting daily. Recommend check feet daily. Recommend annual eye exams. Medicines: Farxiga  10 mg take 1 tablet daily, Ozempic  2 mg weekly. I plan to have he and his wife meet with our diabetes educator and review all his medicines. I asked he bring all the bottles.   Continue to work on eating a healthy diet and exercise.  Labs reviewed today.  Recommend call [facility] care to schedule your diabetic eye appointment.  Recommend the following vaccines (Tetanus -TDAP) and Shingrix series.   Body mass index (BMI) of 27.0 to 27.9 in adult Assessment & Plan: Continue to eat healthy and  exercise.   Coronary artery disease involving native coronary artery of native heart without angina pectoris Assessment & Plan: Stable Recommend baby aspirin 81 mg once daily.  Continue rosuvastatin , zetia , and metoprolol .   Prostate cancer [facility]) Assessment & Plan: Continue active surveillance with [facility].    Rectal bleeding Assessment & Plan: Keep appointment for colonoscopy.  Follow-up with GI   Urge incontinence     No orders of the defined types were placed in this encounter.   No orders of the defined types were placed in this encounter.    Follow-up: Return in about 3 months (around 12/15/2023) for chronic follow up.   I,Katherina A Bramblett,acting as a scribe for Abigail Free, MD.,have documented all relevant documentation  on the behalf of Abigail Free, MD,as directed by  Abigail Free, MD while in the presence of Abigail Free, MD.   An After Visit Summary was printed and given to the patient.  I attest that I have reviewed this visit and agree with the plan scribed by my staff.   Abigail Free, MD [facility] [phone]

## 2023-09-17 NOTE — Assessment & Plan Note (Signed)
Well controlled.  No changes to medicines.  Metoprolol 25 mg take 1 tablet daily, Lisinopril 5 mg  mg take 1 tablet daily Continue to work on eating a healthy diet and exercise.

## 2023-09-21 ENCOUNTER — Encounter: Payer: Self-pay | Admitting: Family Medicine

## 2023-09-21 DIAGNOSIS — K625 Hemorrhage of anus and rectum: Secondary | ICD-10-CM | POA: Insufficient documentation

## 2023-09-21 NOTE — Assessment & Plan Note (Signed)
 Stable Recommend baby aspirin 81 mg once daily.  Continue rosuvastatin , zetia , and metoprolol .

## 2023-09-21 NOTE — Assessment & Plan Note (Signed)
 Keep appointment for colonoscopy.  Follow-up with GI

## 2023-09-21 NOTE — Assessment & Plan Note (Signed)
 Continue to eat healthy and exercise.

## 2023-09-21 NOTE — Assessment & Plan Note (Signed)
 Continue active surveillance with [facility].

## 2023-09-23 NOTE — Telephone Encounter (Signed)
 I have made multiple attempts to contact the patient, and never received the application back.  Closing encouter.

## 2023-10-01 ENCOUNTER — Ambulatory Visit: Payer: PPO

## 2023-10-11 ENCOUNTER — Ambulatory Visit: Payer: PPO | Admitting: Internal Medicine

## 2023-10-11 ENCOUNTER — Encounter: Payer: Self-pay | Admitting: Internal Medicine

## 2023-10-11 ENCOUNTER — Other Ambulatory Visit: Payer: Self-pay

## 2023-10-11 ENCOUNTER — Other Ambulatory Visit (INDEPENDENT_AMBULATORY_CARE_PROVIDER_SITE_OTHER): Payer: PPO

## 2023-10-11 VITALS — BP 120/72 | HR 70 | Temp 97.9°F | Resp 13 | Ht 69.0 in | Wt 186.0 lb

## 2023-10-11 DIAGNOSIS — K625 Hemorrhage of anus and rectum: Secondary | ICD-10-CM | POA: Diagnosis not present

## 2023-10-11 DIAGNOSIS — D128 Benign neoplasm of rectum: Secondary | ICD-10-CM | POA: Diagnosis not present

## 2023-10-11 DIAGNOSIS — C20 Malignant neoplasm of rectum: Secondary | ICD-10-CM

## 2023-10-11 DIAGNOSIS — D123 Benign neoplasm of transverse colon: Secondary | ICD-10-CM | POA: Diagnosis not present

## 2023-10-11 DIAGNOSIS — I10 Essential (primary) hypertension: Secondary | ICD-10-CM | POA: Diagnosis not present

## 2023-10-11 DIAGNOSIS — K56600 Partial intestinal obstruction, unspecified as to cause: Secondary | ICD-10-CM | POA: Diagnosis not present

## 2023-10-11 DIAGNOSIS — Z01812 Encounter for preprocedural laboratory examination: Secondary | ICD-10-CM

## 2023-10-11 DIAGNOSIS — D122 Benign neoplasm of ascending colon: Secondary | ICD-10-CM | POA: Diagnosis not present

## 2023-10-11 DIAGNOSIS — Z1211 Encounter for screening for malignant neoplasm of colon: Secondary | ICD-10-CM | POA: Diagnosis not present

## 2023-10-11 DIAGNOSIS — D120 Benign neoplasm of cecum: Secondary | ICD-10-CM | POA: Diagnosis not present

## 2023-10-11 DIAGNOSIS — K6289 Other specified diseases of anus and rectum: Secondary | ICD-10-CM

## 2023-10-11 DIAGNOSIS — I252 Old myocardial infarction: Secondary | ICD-10-CM | POA: Diagnosis not present

## 2023-10-11 DIAGNOSIS — I2510 Atherosclerotic heart disease of native coronary artery without angina pectoris: Secondary | ICD-10-CM | POA: Diagnosis not present

## 2023-10-11 DIAGNOSIS — D125 Benign neoplasm of sigmoid colon: Secondary | ICD-10-CM

## 2023-10-11 DIAGNOSIS — E119 Type 2 diabetes mellitus without complications: Secondary | ICD-10-CM | POA: Diagnosis not present

## 2023-10-11 LAB — BASIC METABOLIC PANEL
BUN: 10 mg/dL (ref 6–23)
CO2: 27 meq/L (ref 19–32)
Calcium: 8.6 mg/dL (ref 8.4–10.5)
Chloride: 108 meq/L (ref 96–112)
Creatinine, Ser: 0.67 mg/dL (ref 0.40–1.50)
GFR: 94.57 mL/min (ref >60.00)
Glucose, Bld: 110 mg/dL — ABNORMAL HIGH (ref 70–99)
Potassium: 4.4 meq/L (ref 3.5–5.1)
Sodium: 142 meq/L (ref 135–145)

## 2023-10-11 MED ORDER — SODIUM CHLORIDE 0.9 % IV SOLN: 500.0000 mL | Freq: Once | INTRAVENOUS | Status: DC

## 2023-10-11 NOTE — Op Note (Signed)
 [facility] Patient Name: Joshua Nelson Procedure Date: 10/11/2023 10:12 AM MRN: [id number] Endoscopist: Particia Lather , , [id number] Age: 73 Referring MD:  Date of Birth: 04-May-1951 Gender: Male Account #: [id number] Procedure:                Colonoscopy Indications:              Screening for colorectal malignant neoplasm, This                            is the patient's first colonoscopy, Incidental -                            Rectal bleeding Medicines:                Monitored Anesthesia Care Procedure:                Pre-Anesthesia Assessment:                           - Prior to the procedure, a History and Physical                            was performed, and patient medications and                            allergies were reviewed. The patient's tolerance of                            previous anesthesia was also reviewed. The risks                            and benefits of the procedure and the sedation                            options and risks were discussed with the patient.                            All questions were answered, and informed consent                            was obtained. Prior Anticoagulants: The patient has                            taken no anticoagulant or antiplatelet agents. ASA                            Grade Assessment: III - A patient with severe                            systemic disease. After reviewing the risks and                            benefits, the patient was deemed in satisfactory  condition to undergo the procedure.                           After obtaining informed consent, the colonoscope                            was passed under direct vision. Throughout the                            procedure, the patient's blood pressure, pulse, and                            oxygen saturations were monitored continuously. The                            CF [id number] #[id number] was introduced  through the anus                            and advanced to the the terminal ileum. The                            colonoscopy was performed without difficulty. The                            patient tolerated the procedure well. The quality                            of the bowel preparation was adequate. The terminal                            ileum, ileocecal valve, appendiceal orifice, and                            rectum were photographed. Rectal retroflexion was                            not able to be performed as result of the rectal                            mass. Scope In: 10:20:59 AM Scope Out: 10:50:27 AM Scope Withdrawal Time: 0 hours 25 minutes 24 seconds  Total Procedure Duration: 0 hours 29 minutes 28 seconds  Findings:                 The terminal ileum appeared normal.                           Six sessile polyps were found in the transverse                            colon, ascending colon and cecum. The polyps were 3                            to 7 mm in size. These polyps were removed with a  cold snare. Resection and retrieval were complete.                           Two sessile polyps were found in the sigmoid colon.                            The polyps were 4 to 6 mm in size. These polyps                            were removed with a cold snare. Resection and                            retrieval were complete.                           A frond-like/villous, fungating and infiltrative                            partially obstructing large mass was found in the                            rectum. The mass was partially circumferential                            (involving one-third of the lumen circumference).                            The mass measured ten cm in length (extending from                            7 cm from the anal verge to 17 cm from the anal                            verge). In addition, its diameter measured twenty                             mm. The mass was located on the left posterior                            position of the rectum. Oozing was present.                            Biopsies were taken with a cold forceps for                            histology. Area distal and on the opposite wall of                            the mass was tattooed with an injection of 0.5 mL                            of Spot (carbon black). Complications:  No immediate complications. Estimated Blood Loss:     Estimated blood loss was minimal. Impression:               - The examined portion of the ileum was normal.                           - Six 3 to 7 mm polyps in the transverse colon, in                            the ascending colon and in the cecum, removed with                            a cold snare. Resected and retrieved.                           - Two 4 to 6 mm polyps in the sigmoid colon,                            removed with a cold snare. Resected and retrieved.                           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 Biopsied. Tattooed. Recommendation:           - Discharge patient to home (with escort).                           - Await pathology results. I have asked for these                            results to be rushed.                           - Will plan to proceed with CT C/A/P w/contrast for                            staging purposes.                           - The findings and recommendations were discussed                            with the patient. Dr Particia Lather "[place],  10/11/2023 11:01:52 AM

## 2023-10-11 NOTE — Progress Notes (Signed)
 Pt to lab prior to discharge

## 2023-10-11 NOTE — Progress Notes (Signed)
 Pt's states no medical or surgical changes since previsit or office visit.

## 2023-10-11 NOTE — Progress Notes (Signed)
 GASTROENTEROLOGY PROCEDURE H&P NOTE   Primary Care Physician: Blane Ohara, MD    Reason for Procedure:   Colon cancer screening, rectal bleeding  Plan:    Colonoscopy  Patient is appropriate for endoscopic procedure(s) in the ambulatory ([facility]) setting.  The nature of the procedure, as well as the risks, benefits, and alternatives were carefully and thoroughly reviewed with the patient. Ample time for discussion and questions allowed. The patient understood, was satisfied, and agreed to proceed.     HPI: Joshua Nelson is a 73 y.o. male who presents for colonoscopy for evaluation of colon cancer screening, rectal bleeding.  Patient was most recently seen in the Gastroenterology Clinic on 09/11/23.  No interval change in medical history since that appointment. Please refer to that note for full details regarding GI history and clinical presentation.   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   PONV (postoperative nausea and vomiting)    Prostate cancer (HCC) 07/2021    Past Surgical History:  Procedure Laterality Date   CYSTOSCOPY WITH URETHRAL DILATATION N/A 08/01/2021   Procedure: CYSTOSCOPY WITH BALLOON URETHRAL DILATATION;  Surgeon: Noel Christmas, MD;  Location: [facility];  Service: Urology;  Laterality: N/A;   heart stents     x 3   left cataract  10/2019   nose surgery for dog bite as a kid      right cataract Right 08/2019   TRANSURETHRAL RESECTION OF BLADDER TUMOR N/A 08/01/2021   Procedure: TRANSURETHRAL RESECTION OF PROSTATE;  Surgeon: Noel Christmas, MD;  Location: [facility];  Service: Urology;  Laterality: N/A;  90 MINS     Prior to Admission medications   Medication Sig Start Date End Date Taking? Authorizing Provider  Continuous Glucose Sensor (FREESTYLE LIBRE 2 SENSOR) MISC Change sensor every 10 days as directed, 05/29/23   Cox, Kirsten, MD  ezetimibe (ZETIA) 10 MG tablet TAKE ONE (1) TABLET ONCE DAILY 01/18/21   Cox, Kirsten, MD  FARXIGA 10 MG TABS tablet TAKE 1 TABLET BY MOUTH ONCE DAILY IN THE MORNING AS DIRECTED. 04/02/22   Cox, Fritzi Mandes, MD  gabapentin (NEURONTIN) 300 MG capsule Take 1 capsule (300 mg total) by mouth 3 (three) times daily. 08/22/23   Cox, Fritzi Mandes, MD  insulin aspart (FIASP FLEXTOUCH) 100 UNIT/ML FlexTouch Pen Inject 16 Units into the skin 2 (two) times daily. With breakfast and supper 08/22/23   Cox, Fritzi Mandes, MD  LANTUS SOLOSTAR 100 UNIT/ML Solostar Pen Inject 65 Units into the skin daily. Pt takes in the pm per wife 02/22/22   Blane Ohara, MD  lisinopril (ZESTRIL) 5 MG tablet Once daily in the am 06/18/22   Cox, Fritzi Mandes, MD  Meclizine HCl 25 MG CHEW TAKE 1 TABLET THREE TIMES DAILY AS NEEDED FOR DIZZINESS 05/10/22   Cox, Fritzi Mandes, MD  metoprolol succinate (TOPROL-XL) 25 MG 24 hr tablet TAKE 1 TABLET BY MOUTH ONCE DAILY IN THE MORNING. 09/02/23   Cox, Kirsten, MD  MYRBETRIQ 50 MG TB24 tablet Take 50 mg by mouth daily. 08/26/23   [provider]  rosuvastatin (CRESTOR) 40 MG tablet Take 1 tablet (40 mg total) by mouth at  bedtime. 03/12/23   Cox, Kirsten, MD  Semaglutide, 2 MG/DOSE, (OZEMPIC, 2 MG/DOSE,) 8 MG/3ML SOPN Inject 2 mg into the skin once a week. 08/22/23   CoxFritzi Mandes, MD    Current Outpatient Medications  Medication Sig Dispense Refill   Continuous Glucose Sensor (FREESTYLE LIBRE 2 SENSOR) MISC Change sensor every 10 days as directed, 2 each 5   ezetimibe (ZETIA) 10 MG tablet TAKE ONE (1) TABLET ONCE DAILY 90 tablet 1   FARXIGA 10 MG TABS tablet TAKE 1 TABLET BY MOUTH ONCE DAILY IN THE MORNING AS DIRECTED. 90 tablet 1   gabapentin (NEURONTIN) 300 MG capsule Take 1 capsule (300 mg  total) by mouth 3 (three) times daily. 270 capsule 1   insulin aspart (FIASP FLEXTOUCH) 100 UNIT/ML FlexTouch Pen Inject 16 Units into the skin 2 (two) times daily. With breakfast and supper 30 mL 1   LANTUS SOLOSTAR 100 UNIT/ML Solostar Pen Inject 65 Units into the skin daily. Pt takes in the pm per wife 30 mL 1   lisinopril (ZESTRIL) 5 MG tablet Once daily in the am 90 tablet 0   Meclizine HCl 25 MG CHEW TAKE 1 TABLET THREE TIMES DAILY AS NEEDED FOR DIZZINESS 30 tablet 0   metoprolol succinate (TOPROL-XL) 25 MG 24 hr tablet TAKE 1 TABLET BY MOUTH ONCE DAILY IN THE MORNING. 90 tablet 0   MYRBETRIQ 50 MG TB24 tablet Take 50 mg by mouth daily.     rosuvastatin (CRESTOR) 40 MG tablet Take 1 tablet (40 mg total) by mouth at bedtime. 90 tablet 1   Semaglutide, 2 MG/DOSE, (OZEMPIC, 2 MG/DOSE,) 8 MG/3ML SOPN Inject 2 mg into the skin once a week. 9 mL 0   Current Facility-Administered Medications  Medication Dose Route Frequency Provider Last Rate Last Admin   0.9 %  sodium chloride infusion  500 mL Intravenous Once Imogene Burn, MD        Allergies as of 10/11/2023   (No Known Allergies)    Family History  Problem Relation Age of Onset   Endometrial cancer Mother    CAD Mother    AAA (abdominal aortic aneurysm) Mother    Healthy Father    Heart attack Brother    Colon cancer Neg Hx    Esophageal cancer Neg Hx    Liver cancer Neg Hx    Stomach cancer Neg Hx     Social History   Socioeconomic History   Marital status: Married    Spouse name: Olegario Messier   Number of children: 1   Years of education: 12   Highest education level: High school graduate  Occupational History   Occupation: Retired  Tobacco Use   Smoking status: Former    Current packs/day: 0.00    Types: Cigarettes    Quit date: 2003    Years since quitting: 22.1   Smokeless tobacco: Never  Vaping Use   Vaping status: Never Used  Substance and Sexual Activity   Alcohol use: Not Currently    Comment: none since  2000   Drug use: Never   Sexual activity: Not on file  Other Topics Concern   Not on file  Social History Narrative   Lives at home with his wife.   2 cups caffeine per day.   Right-handed.   Social Drivers of Corporate investment banker Strain: Low Risk  (03/21/2023)   Overall Financial Resource Strain (CARDIA)    Difficulty of Paying Living Expenses: Not hard at all  Food  Insecurity: No Food Insecurity (03/21/2023)   Hunger Vital Sign    Worried About Running Out of Food in the Last Year: Never true    Ran Out of Food in the Last Year: Never true  Transportation Needs: No Transportation Needs (03/21/2023)   PRAPARE - Administrator, Civil Service (Medical): No    Lack of Transportation (Non-Medical): No  Physical Activity: Insufficiently Active (03/21/2023)   Exercise Vital Sign    Days of Exercise per Week: 7 days    Minutes of Exercise per Session: 20 min  Stress: No Stress Concern Present (03/21/2023)   [employer] of Occupational Health - Occupational Stress Questionnaire    Feeling of Stress : Not at all  Social Connections: Socially Integrated (03/21/2023)   Social Connection and Isolation Panel [NHANES]    Frequency of Communication with Friends and Family: More than three times a week    Frequency of Social Gatherings with Friends and Family: More than three times a week    Attends Religious Services: More than 4 times per year    Active Member of [employer] or Organizations: Yes    Attends Engineer, structural: More than 4 times per year    Marital Status: Married  Catering manager Violence: Not At Risk (03/21/2023)   Humiliation, Afraid, Rape, and Kick questionnaire    Fear of Current or Ex-Partner: No    Emotionally Abused: No    Physically Abused: No    Sexually Abused: No    Physical Exam: Vital signs in last 24 hours: BP 133/66   Pulse 73   Temp 97.9 F (36.6 C) (Temporal)   Ht 5\' 9"  (1.753 m)   Wt 186 lb (84.4 kg)   SpO2 97%   BMI 27.47  kg/m  GEN: NAD EYE: Sclerae anicteric ENT: MMM CV: Non-tachycardic Pulm: No increased WOB GI: Soft NEURO:  Alert & Oriented   Eulah Pont, MD [facility]   10/11/2023 9:38 AM

## 2023-10-11 NOTE — Patient Instructions (Addendum)
 Await pathology results. I have asked for these results to be rushed. Will plan to proceed with CT chest, abdomen, pelvis w/contrast for staging purposes.  Dr. Derek Mound office will call to set that up                          YOU HAD AN ENDOSCOPIC PROCEDURE TODAY AT THE [facility]:   Refer to the procedure report that was given to you for any specific questions about what was found during the examination.  If the procedure report does not answer your questions, please call your gastroenterologist to clarify.  If you requested that your care partner not be given the details of your procedure findings, then the procedure report has been included in a sealed envelope for you to review at your convenience later.  YOU SHOULD EXPECT: Some feelings of bloating in the abdomen. Passage of more gas than usual.  Walking can help get rid of the air that was put into your GI tract during the procedure and reduce the bloating. If you had a lower endoscopy (such as a colonoscopy or flexible sigmoidoscopy) you may notice spotting of blood in your stool or on the toilet paper. If you underwent a bowel prep for your procedure, you may not have a normal bowel movement for a few days.  Please Note:  You might notice some irritation and congestion in your nose or some drainage.  This is from the oxygen used during your procedure.  There is no need for concern and it should clear up in a day or so.  SYMPTOMS TO REPORT IMMEDIATELY:  Following lower endoscopy (colonoscopy or flexible sigmoidoscopy):  Excessive amounts of blood in the stool  Significant tenderness or worsening of abdominal pains  Swelling of the abdomen that is new, acute  Fever of 100F or higher  For urgent or emergent issues, a gastroenterologist can be reached at any hour by calling [phone]. Do not use MyChart messaging for urgent concerns.    DIET:  We do recommend a small meal at first, but then you may proceed to your  regular diet.  Drink plenty of fluids but you should avoid alcoholic beverages for 24 hours.  ACTIVITY:  You should plan to take it easy for the rest of today and you should NOT DRIVE or use heavy machinery until tomorrow (because of the sedation medicines used during the test).    FOLLOW UP: Our staff will call the number listed on your records the next business day following your procedure.  We will call around 7:15- 8:00 am to check on you and address any questions or concerns that you may have regarding the information given to you following your procedure. If we do not reach you, we will leave a message.     If any biopsies were taken you will be contacted by phone or by letter within the next 1-3 weeks.  Please call us at [phone] if you have not heard about the biopsies in 3 weeks.    SIGNATURES/CONFIDENTIALITY: You and/or your care partner have signed paperwork which will be entered into your electronic medical record.  These signatures attest to the fact that that the information above on your After Visit Summary has been reviewed and is understood.  Full responsibility of the confidentiality of this discharge information lies with you and/or your care-partner.

## 2023-10-11 NOTE — Progress Notes (Signed)
 Called to room to assist during endoscopic procedure.  Patient ID and intended procedure confirmed with present staff. Received instructions for my participation in the procedure from the performing physician.  Pathology sent rush per MD

## 2023-10-11 NOTE — Progress Notes (Signed)
 To pacu, VSS. Report to Rn.tb

## 2023-10-14 ENCOUNTER — Telehealth: Payer: Self-pay | Admitting: Internal Medicine

## 2023-10-14 ENCOUNTER — Telehealth: Payer: Self-pay

## 2023-10-14 LAB — SURGICAL PATHOLOGY

## 2023-10-14 NOTE — Telephone Encounter (Signed)
  Follow up Call-     10/11/2023    9:29 AM  Call back number  Post procedure Call Back phone  # [phone]  Permission to leave phone message Yes     Patient questions:  Do you have a fever, pain , or abdominal swelling? No. Pain Score  0 *  Have you tolerated food without any problems? Yes.    Have you been able to return to your normal activities? Yes.    Do you have any questions about your discharge instructions: Diet   No. Medications  No. Follow up visit  No.  Do you have questions or concerns about your Care? No.  Actions: * If pain score is 4 or above: No action needed, pain <4.

## 2023-10-14 NOTE — Telephone Encounter (Signed)
 Patient called would like to know if he needs to do the CT with contrast.

## 2023-10-15 ENCOUNTER — Telehealth: Payer: Self-pay | Admitting: Internal Medicine

## 2023-10-15 DIAGNOSIS — C20 Malignant neoplasm of rectum: Secondary | ICD-10-CM

## 2023-10-15 NOTE — Telephone Encounter (Signed)
 Patient confused on the arrival time for his CT chest , abd/pelvis with contrast. Reached out to scheduling to confirm the arrival time.

## 2023-10-15 NOTE — Telephone Encounter (Addendum)
 Patient does need oral contrast for the CT of the abd/pelvis. Patient instructed to arrive at 2:00 pm.

## 2023-10-15 NOTE — Telephone Encounter (Signed)
 Received a phone call from pathologist Holley Bouche regarding the patient's pathology results from his recent colonoscopy. Spoke to the patient and his wife about the results of his rectal mass biopsies, which confirmed adenocarcinoma. His other colon polyps were tubular adenomas. Will place urgent referral to medical oncology. Patient inquired about the necessity of oral contrast. I do think oral contrast would be helpful in this situation. Beth, could you contact radiology and confirm that the study will be done with PO contrast?

## 2023-10-15 NOTE — Telephone Encounter (Signed)
 Confirmed with Radiology and with the patient. Arrival time will be at 2:00 pm in order for him to drink oral contrast. Patient's spouse aware.

## 2023-10-16 ENCOUNTER — Telehealth: Payer: Self-pay

## 2023-10-16 NOTE — Telephone Encounter (Signed)
 Left message to let patient know that assistance is ready for pick up.  Fiasp FlexTouch (insulin aspart) injection (2 boxes)

## 2023-10-17 ENCOUNTER — Other Ambulatory Visit: Payer: Self-pay | Admitting: Family Medicine

## 2023-10-22 ENCOUNTER — Ambulatory Visit (HOSPITAL_COMMUNITY)
Admission: RE | Admit: 2023-10-22 | Discharge: 2023-10-22 | Disposition: A | Discharge status: Home / Self Care | Source: Ambulatory Visit | Attending: Internal Medicine | Admitting: Internal Medicine

## 2023-10-22 DIAGNOSIS — C20 Malignant neoplasm of rectum: Secondary | ICD-10-CM | POA: Insufficient documentation

## 2023-10-22 DIAGNOSIS — N3289 Other specified disorders of bladder: Secondary | ICD-10-CM | POA: Diagnosis not present

## 2023-10-22 DIAGNOSIS — I700 Atherosclerosis of aorta: Secondary | ICD-10-CM | POA: Diagnosis not present

## 2023-10-22 MED ORDER — IOHEXOL 300 MG/ML  SOLN
100.0000 mL | Freq: Once | INTRAMUSCULAR | Status: AC | PRN
  Administered 2023-10-22: 100 mL via INTRAVENOUS

## 2023-10-22 MED ORDER — IOHEXOL 300 MG/ML  SOLN
30.0000 mL | Freq: Once | INTRAMUSCULAR | Status: AC | PRN
  Administered 2023-10-22: 30 mL via ORAL

## 2023-10-23 NOTE — Progress Notes (Signed)
 Spoke to the patient about the results of his CT scan.  The CT scan showed the rectal mass that is consistent with his known rectal cancer.  There were also some prominent lymph nodes near that area that were potentially suspicious for spread of cancer to those lymph nodes.  He was also noted to have a 7 mm potential liver lesion.  The radiologist are recommending that a follow-up MRI of the liver be done to better characterize this lesion.  There were no signs of metastasis to the chest.  Patient already has an appointment with Dr. Melvyn Neth with oncology on 3/17.  If his oncologist does not feel that the MRI is necessary, I told the patient that he could go ahead and cancel the appt.  Pod B triage, please order an MRI of the liver with and without Eovist contrast to better characterize his liver lesion seen on CT scan. Please place this as an urgent order.

## 2023-10-24 ENCOUNTER — Other Ambulatory Visit: Payer: Self-pay

## 2023-10-24 DIAGNOSIS — C20 Malignant neoplasm of rectum: Secondary | ICD-10-CM

## 2023-10-24 DIAGNOSIS — K769 Liver disease, unspecified: Secondary | ICD-10-CM

## 2023-10-24 DIAGNOSIS — R9389 Abnormal findings on diagnostic imaging of other specified body structures: Secondary | ICD-10-CM

## 2023-10-24 NOTE — Telephone Encounter (Signed)
 Patient's wife picked up his patient assistance.

## 2023-10-27 ENCOUNTER — Other Ambulatory Visit: Payer: Self-pay | Admitting: Oncology

## 2023-10-27 DIAGNOSIS — C20 Malignant neoplasm of rectum: Secondary | ICD-10-CM

## 2023-10-27 NOTE — Progress Notes (Unsigned)
 [facility]  [address] [place],  [place]  [address] [phone]  Clinic Day:  10/28/2023  Referring physician: Blane Ohara, MD   HISTORY OF PRESENT ILLNESS:  The patient is a 72 y.o. male  who I was asked to consult upon for newly diagnosed rectal cancer.  The patient claims with history dates back to December 2024 when he began having rectal bleeding.  Over the next few months, his rectal bleeding became more prominent to where he began noticing blood loss with every single bowel movement he was having.  This led to a colonoscopy being done in late February 2025, which showed a partially circumferential rectal mass spanning 10 cm, with the distal tip being 7 cm from the anal verge.  For staging purposes, CT scans of his chest/abdomen/pelvis were done last week, which revealed a subtle 7 mm hypodensity in his anterior hepatic dome.  Based upon this finding, the patient is scheduled for an abdominal MRI tomorrow for further evaluation.  The patient comes in today to go over his biopsy and scan results, as well as their implications.  Of note, this gentleman had never undergone a previous colonoscopy.  He denies having any weight loss over these past few months.  To his knowledge, there is no family history of colorectal cancer.  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   PONV (postoperative nausea and vomiting)    Prostate cancer (HCC) 07/2021    PAST SURGICAL HISTORY:   Past Surgical History:  Procedure Laterality Date   CARDIAC CATHETERIZATION     THREE CARDIAC STENTS PLACED    COLONOSCOPY W/ POLYPECTOMY     CYSTOSCOPY WITH URETHRAL DILATATION N/A 08/01/2021   Procedure: CYSTOSCOPY WITH BALLOON URETHRAL DILATATION;  Surgeon: Noel Christmas, MD;  Location: [facility];  Service: Urology;  Laterality: N/A;   left cataract  10/2019   NOSE SURGERY     RESULT OF DOG BITE DURING CHILDHOOD   PROSTATE BIOPSY     TRANSURETHRAL RESECTION OF BLADDER TUMOR N/A 08/01/2021   Procedure: TRANSURETHRAL RESECTION OF PROSTATE;  Surgeon: Noel Christmas, MD;  Location: [facility];  Service: Urology;  Laterality: N/A;  90 MINS   URETHRAL STRICTURE DILATATION      CURRENT MEDICATIONS:   Current Outpatient Medications  Medication Sig Dispense Refill   aspirin EC 81 MG tablet Take 81 mg by mouth daily. Swallow whole.     Continuous Glucose Sensor (FREESTYLE LIBRE 2 SENSOR) MISC Change sensor every 10 days as directed, 2 each 5   ezetimibe (ZETIA) 10 MG tablet TAKE ONE (1) TABLET ONCE DAILY 90 tablet 1   FARXIGA 10 MG TABS tablet TAKE 1 TABLET BY MOUTH ONCE DAILY IN THE MORNING AS DIRECTED. 90 tablet 1   gabapentin (NEURONTIN) 300 MG capsule Take 1 capsule (300 mg total) by mouth 3 (three) times daily. 270 capsule 1   insulin aspart (FIASP FLEXTOUCH) 100 UNIT/ML FlexTouch Pen Inject 16 Units into the skin 2 (two) times daily. With breakfast and supper 30 mL 1   LANTUS SOLOSTAR 100 UNIT/ML Solostar Pen Inject  65 Units into the skin daily. Pt takes in the pm per wife 30 mL 1   lisinopril (ZESTRIL) 5 MG tablet Once daily in the am 90 tablet 0   Meclizine HCl 25 MG CHEW TAKE 1 TABLET THREE TIMES DAILY AS NEEDED FOR DIZZINESS 30 tablet 0   metoprolol succinate (TOPROL-XL) 25 MG 24 hr tablet TAKE 1 TABLET BY MOUTH ONCE DAILY IN THE MORNING. 90 tablet 0   rosuvastatin (CRESTOR) 40 MG tablet Take 1 tablet (40 mg total) by mouth at bedtime. 90 tablet 1   Semaglutide, 2 MG/DOSE, (OZEMPIC, 2 MG/DOSE,) 8 MG/3ML SOPN Inject 2 mg into the skin once a week. 9 mL 0   No current facility-administered  medications for this visit.    ALLERGIES:  No Known Allergies  FAMILY HISTORY:   Family History  Problem Relation Age of Onset   Endometrial cancer Mother    CAD Mother    AAA (abdominal aortic aneurysm) Mother    Dementia Father    Anxiety disorder Father    Depression Father    Heart disease Father    Hypertension Father    Deep vein thrombosis Father    Kidney Stones Sister        H/O : INTESTINAL BLOCKAGE   Barrett's esophagus Sister    Hyperlipidemia Sister    Kidney Stones Brother    Heart attack Brother    Colon cancer Neg Hx    Esophageal cancer Neg Hx    Liver cancer Neg Hx    Stomach cancer Neg Hx    SOCIAL HISTORY:  The patient was born and raised in [place].  [place] his wife of 30 years.  He has 1 child and 2 grandchildren.  He was a [employer] architect for 34 years.  He smoked as much is 4 packs of cigarettes daily for 37 years, but quit smoking 20 years ago.  He consumed as much as a half a case of beer daily for 37 years before quitting 20 years ago.    REVIEW OF SYSTEMS:  Review of Systems  Constitutional:  Negative for fatigue, fever and unexpected weight change.  Eyes:  Positive for eye problems.  Respiratory:  Negative for chest tightness, cough, hemoptysis and shortness of breath.   Cardiovascular:  Negative for chest pain and palpitations.  Gastrointestinal:  Positive for blood in stool. Negative for abdominal distention, abdominal pain, constipation, diarrhea, nausea and vomiting.  Genitourinary:  Negative for dysuria, frequency and hematuria.   Musculoskeletal:  Positive for arthralgias. Negative for back pain and myalgias.  Skin:  Negative for itching and rash.  Neurological:  Positive for dizziness. Negative for headaches and light-headedness.  Psychiatric/Behavioral:  Negative for depression and suicidal ideas. The patient is not nervous/anxious.     PHYSICAL EXAM:  Blood pressure (!) 147/78, pulse 67, temperature 98.7 F  (37.1 C), temperature source Oral, resp. rate 18, height 5\' 9"  (1.753 m), weight 189 lb (85.7 kg), SpO2 100%. Wt Readings from Last 3 Encounters:  10/28/23 189 lb (85.7 kg)  10/11/23 186 lb (84.4 kg)  09/17/23 189 lb (85.7 kg)   Body mass index is 27.91 kg/m. Performance status (ECOG): 0 - Asymptomatic Physical Exam Constitutional:      Appearance: Normal appearance. He is not ill-appearing.  HENT:     Mouth/Throat:     Mouth: Mucous membranes are moist.     Pharynx: Oropharynx is clear. No oropharyngeal exudate or posterior oropharyngeal erythema.  Cardiovascular:  Rate and Rhythm: Normal rate and regular rhythm.     Heart sounds: No murmur heard.    No friction rub. No gallop.  Pulmonary:     Effort: Pulmonary effort is normal. No respiratory distress.     Breath sounds: Normal breath sounds. No wheezing, rhonchi or rales.  Abdominal:     General: Bowel sounds are normal. There is no distension.     Palpations: Abdomen is soft. There is no mass.     Tenderness: There is no abdominal tenderness.  Musculoskeletal:        General: No swelling.     Right lower leg: No edema.     Left lower leg: No edema.  Lymphadenopathy:     Cervical: No cervical adenopathy.     Upper Body:     Right upper body: No supraclavicular or axillary adenopathy.     Left upper body: No supraclavicular or axillary adenopathy.     Lower Body: No right inguinal adenopathy. No left inguinal adenopathy.  Skin:    General: Skin is warm.     Coloration: Skin is not jaundiced.     Findings: No lesion or rash.  Neurological:     General: No focal deficit present.     Mental Status: He is alert and oriented to person, place, and time. Mental status is at baseline.  Psychiatric:        Mood and Affect: Mood normal.        Behavior: Behavior normal.        Thought Content: Thought content normal.    LABS:      Latest Ref Rng & Units 10/28/2023   10:21 AM 09/11/2023    3:12 PM 06/04/2023    8:39  AM  CBC  WBC 4.0 - 10.5 K/uL 7.5  8.3  7.3   Hemoglobin 13.0 - 17.0 g/dL 71.6  96.7  89.3   Hematocrit 39.0 - 52.0 % 33.8  33.3  38.6   Platelets 150 - 400 K/uL 207  206.0  191       Latest Ref Rng & Units 10/28/2023   10:21 AM 10/11/2023   11:43 AM 09/11/2023    3:12 PM  CMP  Glucose 70 - 99 mg/dL 810  175  95   BUN 8 - 23 mg/dL 15  10  11    Creatinine 0.61 - 1.24 mg/dL 1.02  5.85  2.77   Sodium 135 - 145 mmol/L 140  142  140   Potassium 3.5 - 5.1 mmol/L 4.2  4.4  3.9   Chloride 98 - 111 mmol/L 104  108  105   CO2 22 - 32 mmol/L 26  27  28    Calcium 8.9 - 10.3 mg/dL 9.4  8.6  9.2   Total Protein 6.5 - 8.1 g/dL 7.3   7.1   Total Bilirubin 0.0 - 1.2 mg/dL 0.3   0.5   Alkaline Phos 38 - 126 U/L 71   62   AST 15 - 41 U/L 19   14   ALT 0 - 44 U/L 8   11    Lab Results  Component Value Date   CEA 13.98 (H) 10/28/2023   PATHOLOGY:    FINAL DIAGNOSIS        1. Colon, polyp(s), transverse, ascending, and cecum (6) :       - TUBULAR ADENOMA(S)       - NEGATIVE FOR HIGH-GRADE DYSPLASIA OR MALIGNANCY  2. Sigmoid  Colon Polyp, (2) :       - TUBULAR ADENOMA(S)       - NEGATIVE FOR HIGH-GRADE DYSPLASIA OR MALIGNANCY        3. Rectum, biopsy,  :       - AT LEAST INTRAMUCOSAL ADENOCARCINOMA, SEE NOTE        Diagnosis Note : Depth of invasion cannot be judged due to the superficial       nature of the biopsies.   STUDIES:  CT CHEST ABDOMEN PELVIS W CONTRAST Result Date: 10/23/2023 CLINICAL DATA:  Rectal cancer, staging.  * Tracking Code: BO * EXAM: CT CHEST, ABDOMEN, AND PELVIS WITH CONTRAST TECHNIQUE: Multidetector CT imaging of the chest, abdomen and pelvis was performed following the standard protocol during bolus administration of intravenous contrast. RADIATION DOSE REDUCTION: This exam was performed according to the departmental dose-optimization program which includes automated exposure control, adjustment of the mA and/or kV according to patient size and/or use of  iterative reconstruction technique. CONTRAST:  30mL OMNIPAQUE IOHEXOL 300 MG/ML SOLN, OMNIPAQUE IOHEXOL 300 MG/ML SOLN COMPARISON:  CT December 07, 2014 FINDINGS: CT CHEST FINDINGS Cardiovascular: Aortic atherosclerosis. Normal caliber thoracic aorta. Normal size heart. Three-vessel coronary artery calcifications. Mediastinum/Nodes: No suspicious thyroid nodule. No pathologically enlarged mediastinal, hilar or axillary lymph nodes. The esophagus is grossly unremarkable. Lungs/Pleura: No suspicious pulmonary nodules or masses. No focal airspace consolidation. No pleural effusion. No pneumothorax. Musculoskeletal: No aggressive lytic or blastic lesion of bone. Multilevel degenerative changes spine. CT ABDOMEN PELVIS FINDINGS Hepatobiliary: Subtle 7 mm hypodensity in the anterior hepatic dome on image 50/2. Gallbladder is unremarkable. No biliary ductal dilation. Pancreas: No pancreatic ductal dilation or evidence of acute inflammation. Spleen: No splenomegaly. Adrenals/Urinary Tract: Bilateral adrenal glands appear normal. No hydronephrosis. Kidneys demonstrate symmetric enhancement. Mild symmetric wall thickening of a nondistended urinary bladder. Stomach/Bowel: Radiopaque enteric contrast material traverses the ascending colon. Stomach is unremarkable for degree of distension. No pathologic dilation of small or large bowel. Colonic stool burden compatible constipation. Asymmetric low rectal wall thickening extending from the 3 o'clock to the 9 o'clock position and measuring up to 15 mm in thickness on image 123/2. Stranding in the adjacent mesorectal fat. Vascular/Lymphatic: Aortic atherosclerosis. Round prominent mesorectal and presacral lymph nodes. For reference: -mesorectal lymph node measuring mm lymph node on image 117/2. -Presacral lymph node measuring 4 mm on image 103/2 Reproductive: TURP defect in the prostate gland. Other: No significant abdominopelvic free fluid. Musculoskeletal: Lucency in the  anterior aspect of the left iliac bone along the SI joint has been present back to 2016 compatible with a benign finding. No aggressive lytic or blastic lesion of bone. Multilevel degenerative changes spine. Degenerative changes bilateral hips. IMPRESSION: 1. Asymmetric low rectal wall thickening extending from the 3 o'clock to the 9 o'clock position and measuring up to 15 mm in thickness, compatible with known rectal cancer. 2. Round prominent mesorectal and presacral lymph nodes, suspicious for local nodal metastasis. 3. Subtle 7 mm hypodensity in the anterior hepatic dome, nonspecific suggest more definitive assessment by hepatic protocol MRI with without contrast. 4. No evidence of metastatic disease in the chest. 5. Mild symmetric wall thickening of a nondistended urinary bladder, correlate with urinalysis to exclude cystitis. 6. Colonic stool burden compatible with constipation. 7. Aortic atherosclerosis. Electronically Signed   By: Maudry Mayhew M.D.   On: 10/23/2023 16:14    ASSESSMENT & PLAN:  A 73 y.o. male who I was asked to consult upon for  what clinically appears to be stage III rectal adenocarcinoma.  This initial staging is based upon his primary mass, as well as scans showing what appears to be regional lymphadenopathy in his rectal region.  Theoretically, with such staging, a total neoadjuvant treatment approach would be used to ultimately cure him of his disease.  As mentioned previously, the patient is scheduled for an abdominal MRI tomorrow to ensure that the 7 mm hypodensity in his liver does not represent anything ominous.  I will see him back in 2 days to go over his abdominal MRI results, which will be used to better formulate his stage of disease, as well as a potential treatment plan.  The patient understands all the plans discussed today and is in agreement with them.  I do appreciate Cox, Kirsten, MD for his new consult.   Ivah Girardot Kirby Funk, MD

## 2023-10-28 ENCOUNTER — Encounter: Payer: Self-pay | Admitting: Oncology

## 2023-10-28 ENCOUNTER — Other Ambulatory Visit: Payer: Self-pay

## 2023-10-28 ENCOUNTER — Inpatient Hospital Stay: Attending: Oncology | Admitting: Oncology

## 2023-10-28 ENCOUNTER — Telehealth: Payer: Self-pay | Admitting: Oncology

## 2023-10-28 ENCOUNTER — Inpatient Hospital Stay

## 2023-10-28 VITALS — BP 147/78 | HR 67 | Temp 98.7°F | Resp 18 | Ht 69.0 in | Wt 189.0 lb

## 2023-10-28 DIAGNOSIS — C787 Secondary malignant neoplasm of liver and intrahepatic bile duct: Secondary | ICD-10-CM | POA: Insufficient documentation

## 2023-10-28 DIAGNOSIS — C20 Malignant neoplasm of rectum: Secondary | ICD-10-CM | POA: Diagnosis not present

## 2023-10-28 LAB — CBC WITH DIFFERENTIAL (CANCER CENTER ONLY)
Abs Immature Granulocytes: 0.04 10*3/uL (ref 0.00–0.07)
Basophils Absolute: 0.1 10*3/uL (ref 0.0–0.1)
Basophils Relative: 1 %
Eosinophils Absolute: 0.5 10*3/uL (ref 0.0–0.5)
Eosinophils Relative: 7 %
HCT: 33.8 % — ABNORMAL LOW (ref 39.0–52.0)
Hemoglobin: 10.2 g/dL — ABNORMAL LOW (ref 13.0–17.0)
Immature Granulocytes: 1 %
Immature Platelet Fraction: 1 % — ABNORMAL LOW (ref 1.2–8.6)
Lymphocytes Relative: 23 %
Lymphs Abs: 1.7 10*3/uL (ref 0.7–4.0)
MCH: 26 pg (ref 26.0–34.0)
MCHC: 30.2 g/dL (ref 30.0–36.0)
MCV: 86.2 fL (ref 80.0–100.0)
Monocytes Absolute: 0.6 10*3/uL (ref 0.1–1.0)
Monocytes Relative: 8 %
Neutro Abs: 4.6 10*3/uL (ref 1.7–7.7)
Neutrophils Relative %: 60 %
Platelet Count: 207 10*3/uL (ref 150–400)
RBC: 3.92 MIL/uL — ABNORMAL LOW (ref 4.22–5.81)
RDW: 14.1 % (ref 11.5–15.5)
WBC Count: 7.5 10*3/uL (ref 4.0–10.5)
nRBC: 0 % (ref 0.0–0.2)
nRBC: 0 /100{WBCs}

## 2023-10-28 LAB — CMP (CANCER CENTER ONLY)
ALT: 8 U/L (ref 0–44)
AST: 19 U/L (ref 15–41)
Albumin: 4.4 g/dL (ref 3.5–5.0)
Alkaline Phosphatase: 71 U/L (ref 38–126)
Anion gap: 10 (ref 5–15)
BUN: 15 mg/dL (ref 8–23)
CO2: 26 mmol/L (ref 22–32)
Calcium: 9.4 mg/dL (ref 8.9–10.3)
Chloride: 104 mmol/L (ref 98–111)
Creatinine: 1.02 mg/dL (ref 0.61–1.24)
GFR, Estimated: >60 mL/min (ref >60)
Glucose, Bld: 114 mg/dL — ABNORMAL HIGH (ref 70–99)
Potassium: 4.2 mmol/L (ref 3.5–5.1)
Sodium: 140 mmol/L (ref 135–145)
Total Bilirubin: 0.3 mg/dL (ref 0.0–1.2)
Total Protein: 7.3 g/dL (ref 6.5–8.1)

## 2023-10-28 LAB — CEA (ACCESS): CEA (CHCC): 13.98 ng/mL — ABNORMAL HIGH (ref 0.00–5.00)

## 2023-10-28 NOTE — Telephone Encounter (Signed)
 Patient has been scheduled for follow-up visit per 10/28/23 LOS.  Pt given an appt calendar with date and time.

## 2023-10-29 ENCOUNTER — Ambulatory Visit (HOSPITAL_COMMUNITY)
Admission: RE | Admit: 2023-10-29 | Discharge: 2023-10-29 | Disposition: A | Discharge status: Home / Self Care | Source: Ambulatory Visit | Attending: Internal Medicine | Admitting: Internal Medicine

## 2023-10-29 DIAGNOSIS — C20 Malignant neoplasm of rectum: Secondary | ICD-10-CM | POA: Insufficient documentation

## 2023-10-29 DIAGNOSIS — K769 Liver disease, unspecified: Secondary | ICD-10-CM | POA: Diagnosis not present

## 2023-10-29 DIAGNOSIS — R9389 Abnormal findings on diagnostic imaging of other specified body structures: Secondary | ICD-10-CM | POA: Diagnosis not present

## 2023-10-29 MED ORDER — GADOXETATE DISODIUM 0.25 MMOL/ML IV SOLN
10.0000 mL | Freq: Once | INTRAVENOUS | Status: AC | PRN
  Administered 2023-10-29: 10 mL via INTRAVENOUS

## 2023-10-30 ENCOUNTER — Encounter: Payer: Self-pay | Admitting: *Deleted

## 2023-10-30 ENCOUNTER — Inpatient Hospital Stay: Admitting: Oncology

## 2023-10-30 ENCOUNTER — Other Ambulatory Visit: Payer: Self-pay | Admitting: Oncology

## 2023-10-30 VITALS — BP 139/78 | HR 72 | Temp 98.8°F | Resp 14 | Ht 69.0 in | Wt 190.0 lb

## 2023-10-30 DIAGNOSIS — C787 Secondary malignant neoplasm of liver and intrahepatic bile duct: Secondary | ICD-10-CM

## 2023-10-30 DIAGNOSIS — C20 Malignant neoplasm of rectum: Secondary | ICD-10-CM

## 2023-10-30 MED ORDER — TRAMADOL HCL 50 MG PO TABS: ORAL_TABLET | ORAL | 0 refills | Status: DC

## 2023-10-30 NOTE — Progress Notes (Signed)
 [facility]  [address] [place],  [place]  [address] [phone]  Clinic Day:  10/28/2023  Referring physician: Blane Ohara, MD   HISTORY OF PRESENT ILLNESS:  The patient is a 73 y.o. male  who I recently began seeing for locally advanced rectal cancer.  Due to a vague, small hepatic lesion seen per his CT scan last week, and abdominal MRI was done yesterday to better delineate what is going on within his liver.  He comes in today to go over his MRI images and their implications.  Since his visit earlier this week, the patient has been doing well.  However, he continues to notice rectal bleeding with every single bowel movement.  PHYSICAL EXAM:  Blood pressure 139/78, pulse 72, temperature 98.8 F (37.1 C), temperature source Oral, resp. rate 14, height 5\' 9"  (1.753 m), weight 190 lb (86.2 kg), SpO2 100%. Wt Readings from Last 3 Encounters:  10/30/23 190 lb (86.2 kg)  10/28/23 189 lb (85.7 kg)  10/11/23 186 lb (84.4 kg)   Body mass index is 28.06 kg/m. Performance status (ECOG): 0 - Asymptomatic Physical Exam Constitutional:      Appearance: Normal appearance. He is not ill-appearing.  HENT:     Mouth/Throat:     Mouth: Mucous membranes are moist.     Pharynx: Oropharynx is clear. No oropharyngeal exudate or posterior oropharyngeal erythema.  Cardiovascular:     Rate and Rhythm: Normal rate and regular rhythm.     Heart sounds: No murmur heard.    No friction rub. No gallop.  Pulmonary:     Effort: Pulmonary effort is normal. No respiratory distress.     Breath sounds: Normal breath sounds. No wheezing, rhonchi or rales.  Abdominal:     General: Bowel sounds are normal. There is no distension.     Palpations: Abdomen is soft. There is no mass.     Tenderness: There is no abdominal tenderness.  Musculoskeletal:        General: No swelling.     Right lower leg: No edema.     Left lower leg: No edema.  Lymphadenopathy:     Cervical:  No cervical adenopathy.     Upper Body:     Right upper body: No supraclavicular or axillary adenopathy.     Left upper body: No supraclavicular or axillary adenopathy.     Lower Body: No right inguinal adenopathy. No left inguinal adenopathy.  Skin:    General: Skin is warm.     Coloration: Skin is not jaundiced.     Findings: No lesion or rash.  Neurological:     General: No focal deficit present.     Mental Status: He is alert and oriented to person, place, and time. Mental status is at baseline.  Psychiatric:        Mood and Affect: Mood normal.        Behavior: Behavior normal.        Thought Content: Thought content normal.    LABS:      Latest Ref Rng & Units 10/28/2023   10:21 AM 09/11/2023    3:12 PM 06/04/2023    8:39 AM  CBC  WBC 4.0 - 10.5 K/uL 7.5  8.3  7.3   Hemoglobin 13.0 - 17.0 g/dL 46.9  62.9  52.8   Hematocrit 39.0 - 52.0 % 33.8  33.3  38.6   Platelets 150 - 400 K/uL 207  206.0  191  Latest Ref Rng & Units 10/28/2023   10:21 AM 10/11/2023   11:43 AM 09/11/2023    3:12 PM  CMP  Glucose 70 - 99 mg/dL 440  102  95   BUN 8 - 23 mg/dL 15  10  11    Creatinine 0.61 - 1.24 mg/dL 7.25  3.66  4.40   Sodium 135 - 145 mmol/L 140  142  140   Potassium 3.5 - 5.1 mmol/L 4.2  4.4  3.9   Chloride 98 - 111 mmol/L 104  108  105   CO2 22 - 32 mmol/L 26  27  28    Calcium 8.9 - 10.3 mg/dL 9.4  8.6  9.2   Total Protein 6.5 - 8.1 g/dL 7.3   7.1   Total Bilirubin 0.0 - 1.2 mg/dL 0.3   0.5   Alkaline Phos 38 - 126 U/L 71   62   AST 15 - 41 U/L 19   14   ALT 0 - 44 U/L 8   11    Lab Results  Component Value Date   CEA 13.98 (H) 10/28/2023   PATHOLOGY:    FINAL DIAGNOSIS        1. Colon, polyp(s), transverse, ascending, and cecum (6) :       - TUBULAR ADENOMA(S)       - NEGATIVE FOR HIGH-GRADE DYSPLASIA OR MALIGNANCY        2. Sigmoid  Colon Polyp, (2) :       - TUBULAR ADENOMA(S)       - NEGATIVE FOR HIGH-GRADE DYSPLASIA OR MALIGNANCY        3. Rectum,  biopsy,  :       - AT LEAST INTRAMUCOSAL ADENOCARCINOMA, SEE NOTE        Diagnosis Note : Depth of invasion cannot be judged due to the superficial       nature of the biopsies.   STUDIES:  MR LIVER W WO CONTRAST Result Date: 10/30/2023 CLINICAL DATA:  7 mm lesion anterior hepatic dome seen on CT imaging. Personal history of rectal cancer. EXAM: MRI ABDOMEN WITHOUT AND WITH CONTRAST TECHNIQUE: Multiplanar multisequence MR imaging of the abdomen was performed both before and after the administration of intravenous contrast. CONTRAST:  10 cc Eovist COMPARISON:  CT scan 10/22/2023 FINDINGS: Lower chest: Unremarkable. Hepatobiliary: Numerous (on the order of 30) tiny lesions are seen scattered through both hepatic lobes ranging in size from 7 mm up to a maximum size of approximately 10 mm (see axial diffusion series 16 and axial T2 series 14). These restrict diffusion and do not have T2 imaging characteristics compatible with cysts or benign biliary hamartomas. After IV contrast administration some lesion show peripheral enhancement. 20 minute delayed hepatocyte phase imaging shows no accumulation of contrast within the lesions. There is no evidence for gallstones, gallbladder wall thickening, or pericholecystic fluid. No intrahepatic or extrahepatic biliary dilation. Pancreas: No focal mass lesion. No dilatation of the main duct. No intraparenchymal cyst. No peripancreatic edema. Spleen:  No splenomegaly. No suspicious focal mass lesion. Adrenals/Urinary Tract: No adrenal nodule or mass. Kidneys unremarkable. Stomach/Bowel: Stomach is unremarkable. No gastric wall thickening. No evidence of outlet obstruction. Duodenum is normally positioned as is the ligament of Treitz. No small bowel or colonic dilatation within the visualized abdomen. Vascular/Lymphatic: No abdominal aortic aneurysm. No retroperitoneal or mesenteric lymphadenopathy. Upper normal lymph nodes are seen in the hepatoduodenal ligament and  gastrohepatic ligament. Other:  No intraperitoneal free fluid. Musculoskeletal: No focal suspicious  marrow enhancement within the visualized bony anatomy. IMPRESSION: 1. Numerous (on the order of 30) tiny lesions scattered through both hepatic lobes. These restrict diffusion and do not have T2 imaging characteristics compatible with cysts or benign biliary hamartomas. Some lesions show peripheral enhancement. Imaging features are highly suspicious for metastatic disease. 2. Upper normal lymph nodes in the hepatoduodenal ligament and gastrohepatic ligament. These are nonspecific but could be metastatic. Attention on follow-up recommended. 3. No other acute findings in the abdomen. Electronically Signed   By: Kennith Center M.D.   On: 10/30/2023 08:54   CT CHEST ABDOMEN PELVIS W CONTRAST Result Date: 10/23/2023 CLINICAL DATA:  Rectal cancer, staging.  * Tracking Code: BO * EXAM: CT CHEST, ABDOMEN, AND PELVIS WITH CONTRAST TECHNIQUE: Multidetector CT imaging of the chest, abdomen and pelvis was performed following the standard protocol during bolus administration of intravenous contrast. RADIATION DOSE REDUCTION: This exam was performed according to the departmental dose-optimization program which includes automated exposure control, adjustment of the mA and/or kV according to patient size and/or use of iterative reconstruction technique. CONTRAST:  30mL OMNIPAQUE IOHEXOL 300 MG/ML SOLN, OMNIPAQUE IOHEXOL 300 MG/ML SOLN COMPARISON:  CT December 07, 2014 FINDINGS: CT CHEST FINDINGS Cardiovascular: Aortic atherosclerosis. Normal caliber thoracic aorta. Normal size heart. Three-vessel coronary artery calcifications. Mediastinum/Nodes: No suspicious thyroid nodule. No pathologically enlarged mediastinal, hilar or axillary lymph nodes. The esophagus is grossly unremarkable. Lungs/Pleura: No suspicious pulmonary nodules or masses. No focal airspace consolidation. No pleural effusion. No pneumothorax. Musculoskeletal: No  aggressive lytic or blastic lesion of bone. Multilevel degenerative changes spine. CT ABDOMEN PELVIS FINDINGS Hepatobiliary: Subtle 7 mm hypodensity in the anterior hepatic dome on image 50/2. Gallbladder is unremarkable. No biliary ductal dilation. Pancreas: No pancreatic ductal dilation or evidence of acute inflammation. Spleen: No splenomegaly. Adrenals/Urinary Tract: Bilateral adrenal glands appear normal. No hydronephrosis. Kidneys demonstrate symmetric enhancement. Mild symmetric wall thickening of a nondistended urinary bladder. Stomach/Bowel: Radiopaque enteric contrast material traverses the ascending colon. Stomach is unremarkable for degree of distension. No pathologic dilation of small or large bowel. Colonic stool burden compatible constipation. Asymmetric low rectal wall thickening extending from the 3 o'clock to the 9 o'clock position and measuring up to 15 mm in thickness on image 123/2. Stranding in the adjacent mesorectal fat. Vascular/Lymphatic: Aortic atherosclerosis. Round prominent mesorectal and presacral lymph nodes. For reference: -mesorectal lymph node measuring mm lymph node on image 117/2. -Presacral lymph node measuring 4 mm on image 103/2 Reproductive: TURP defect in the prostate gland. Other: No significant abdominopelvic free fluid. Musculoskeletal: Lucency in the anterior aspect of the left iliac bone along the SI joint has been present back to 2016 compatible with a benign finding. No aggressive lytic or blastic lesion of bone. Multilevel degenerative changes spine. Degenerative changes bilateral hips. IMPRESSION: 1. Asymmetric low rectal wall thickening extending from the 3 o'clock to the 9 o'clock position and measuring up to 15 mm in thickness, compatible with known rectal cancer. 2. Round prominent mesorectal and presacral lymph nodes, suspicious for local nodal metastasis. 3. Subtle 7 mm hypodensity in the anterior hepatic dome, nonspecific suggest more definitive assessment by  hepatic protocol MRI with without contrast. 4. No evidence of metastatic disease in the chest. 5. Mild symmetric wall thickening of a nondistended urinary bladder, correlate with urinalysis to exclude cystitis. 6. Colonic stool burden compatible with constipation. 7. Aortic atherosclerosis. Electronically Signed   By: Maudry Mayhew M.D.   On: 10/23/2023 16:14    ASSESSMENT & PLAN:  A  73 y.o. male who unfortunately appears to have metastatic rectal cancer.  Unexpectedly, this gentleman's abdominal MRI showed multiple small lesions dispersed throughout his entire liver.  They appear extremely ominous for hepatic metastases.  To prove this, I will have interventional radiology biopsy one of the lesions in question.  If one of the lesions is consistent with metastatic rectal adenocarcinoma, the patient understands that he would ultimately be dealing with incurable disease.  Moving forward, the goal of treatment would be prolonged disease control, not cure.  I will see this patient back in 2 weeks to go over his biopsy results and their implications.  Although despondent, the patient understands all the plans discussed today and is in agreement with them.  Koron Godeaux Kirby Funk, MD

## 2023-10-30 NOTE — Progress Notes (Unsigned)
 Suttle, Thressa Sheller, MD  Weston Settle, MD; Marylynn Pearson I spoke with Dr. Melvyn Neth about this pt.  He has placed order for liver bx.  Please schedule as follows:    PROCEDURE / BIOPSY REVIEW Date: 10/30/23  Requested Biopsy site: liver mass Reason for request: concern for metastasis Imaging review: Best seen on MRI abdomen 10/22/23  Decision: Approved Imaging modality to perform: Ultrasound Schedule with: Moderate Sedation Schedule for: Any VIR  Additional comments: none  Please contact me with questions, concerns, or if issue pertaining to this request arise.  Bennie Dallas, MD [facility]

## 2023-10-31 ENCOUNTER — Other Ambulatory Visit: Payer: Self-pay

## 2023-10-31 NOTE — Progress Notes (Signed)
 This information is for the sake of discussion at tumor board only and not a part of the official treatment plan.

## 2023-11-04 ENCOUNTER — Other Ambulatory Visit: Payer: Self-pay | Admitting: Radiology

## 2023-11-04 DIAGNOSIS — C20 Malignant neoplasm of rectum: Secondary | ICD-10-CM

## 2023-11-05 ENCOUNTER — Encounter (HOSPITAL_COMMUNITY): Payer: Self-pay

## 2023-11-05 ENCOUNTER — Ambulatory Visit (HOSPITAL_COMMUNITY)
Admission: RE | Admit: 2023-11-05 | Discharge: 2023-11-05 | Disposition: A | Discharge status: Home / Self Care | Source: Ambulatory Visit | Attending: Oncology

## 2023-11-05 ENCOUNTER — Other Ambulatory Visit: Payer: Self-pay

## 2023-11-05 ENCOUNTER — Ambulatory Visit (HOSPITAL_COMMUNITY)
Admission: RE | Admit: 2023-11-05 | Discharge: 2023-11-05 | Disposition: A | Discharge status: Home / Self Care | Source: Ambulatory Visit | Attending: Oncology | Admitting: Oncology

## 2023-11-05 DIAGNOSIS — I700 Atherosclerosis of aorta: Secondary | ICD-10-CM | POA: Insufficient documentation

## 2023-11-05 DIAGNOSIS — K769 Liver disease, unspecified: Secondary | ICD-10-CM | POA: Diagnosis not present

## 2023-11-05 DIAGNOSIS — C20 Malignant neoplasm of rectum: Secondary | ICD-10-CM | POA: Diagnosis not present

## 2023-11-05 DIAGNOSIS — Z7984 Long term (current) use of oral hypoglycemic drugs: Secondary | ICD-10-CM | POA: Insufficient documentation

## 2023-11-05 DIAGNOSIS — Z8546 Personal history of malignant neoplasm of prostate: Secondary | ICD-10-CM | POA: Diagnosis not present

## 2023-11-05 DIAGNOSIS — Z01818 Encounter for other preprocedural examination: Secondary | ICD-10-CM | POA: Insufficient documentation

## 2023-11-05 DIAGNOSIS — Z8673 Personal history of transient ischemic attack (TIA), and cerebral infarction without residual deficits: Secondary | ICD-10-CM | POA: Insufficient documentation

## 2023-11-05 DIAGNOSIS — C787 Secondary malignant neoplasm of liver and intrahepatic bile duct: Secondary | ICD-10-CM | POA: Insufficient documentation

## 2023-11-05 DIAGNOSIS — Z794 Long term (current) use of insulin: Secondary | ICD-10-CM | POA: Diagnosis not present

## 2023-11-05 DIAGNOSIS — M1990 Unspecified osteoarthritis, unspecified site: Secondary | ICD-10-CM | POA: Insufficient documentation

## 2023-11-05 DIAGNOSIS — Z87891 Personal history of nicotine dependence: Secondary | ICD-10-CM | POA: Diagnosis not present

## 2023-11-05 DIAGNOSIS — R160 Hepatomegaly, not elsewhere classified: Secondary | ICD-10-CM | POA: Insufficient documentation

## 2023-11-05 DIAGNOSIS — Z85048 Personal history of other malignant neoplasm of rectum, rectosigmoid junction, and anus: Secondary | ICD-10-CM | POA: Insufficient documentation

## 2023-11-05 DIAGNOSIS — K5900 Constipation, unspecified: Secondary | ICD-10-CM | POA: Insufficient documentation

## 2023-11-05 DIAGNOSIS — I10 Essential (primary) hypertension: Secondary | ICD-10-CM | POA: Diagnosis not present

## 2023-11-05 DIAGNOSIS — I2510 Atherosclerotic heart disease of native coronary artery without angina pectoris: Secondary | ICD-10-CM | POA: Insufficient documentation

## 2023-11-05 DIAGNOSIS — I252 Old myocardial infarction: Secondary | ICD-10-CM | POA: Diagnosis not present

## 2023-11-05 DIAGNOSIS — Z7985 Long-term (current) use of injectable non-insulin antidiabetic drugs: Secondary | ICD-10-CM | POA: Insufficient documentation

## 2023-11-05 DIAGNOSIS — E119 Type 2 diabetes mellitus without complications: Secondary | ICD-10-CM | POA: Insufficient documentation

## 2023-11-05 LAB — CBC WITH DIFFERENTIAL/PLATELET
Abs Immature Granulocytes: 0.02 10*3/uL (ref 0.00–0.07)
Basophils Absolute: 0.1 10*3/uL (ref 0.0–0.1)
Basophils Relative: 1 %
Eosinophils Absolute: 0.5 10*3/uL (ref 0.0–0.5)
Eosinophils Relative: 6 %
HCT: 33 % — ABNORMAL LOW (ref 39.0–52.0)
Hemoglobin: 9.9 g/dL — ABNORMAL LOW (ref 13.0–17.0)
Immature Granulocytes: 0 %
Lymphocytes Relative: 21 %
Lymphs Abs: 1.6 10*3/uL (ref 0.7–4.0)
MCH: 26.1 pg (ref 26.0–34.0)
MCHC: 30 g/dL (ref 30.0–36.0)
MCV: 86.8 fL (ref 80.0–100.0)
Monocytes Absolute: 0.6 10*3/uL (ref 0.1–1.0)
Monocytes Relative: 8 %
Neutro Abs: 4.9 10*3/uL (ref 1.7–7.7)
Neutrophils Relative %: 64 %
Platelets: 207 10*3/uL (ref 150–400)
RBC: 3.8 MIL/uL — ABNORMAL LOW (ref 4.22–5.81)
RDW: 13.9 % (ref 11.5–15.5)
WBC: 7.6 10*3/uL (ref 4.0–10.5)
nRBC: 0 % (ref 0.0–0.2)

## 2023-11-05 LAB — COMPREHENSIVE METABOLIC PANEL
ALT: 15 U/L (ref 0–44)
AST: 18 U/L (ref 15–41)
Albumin: 3.9 g/dL (ref 3.5–5.0)
Alkaline Phosphatase: 60 U/L (ref 38–126)
Anion gap: 8 (ref 5–15)
BUN: 13 mg/dL (ref 8–23)
CO2: 25 mmol/L (ref 22–32)
Calcium: 9.2 mg/dL (ref 8.9–10.3)
Chloride: 104 mmol/L (ref 98–111)
Creatinine, Ser: 0.68 mg/dL (ref 0.61–1.24)
GFR, Estimated: >60 mL/min (ref >60)
Glucose, Bld: 112 mg/dL — ABNORMAL HIGH (ref 70–99)
Potassium: 4.1 mmol/L (ref 3.5–5.1)
Sodium: 137 mmol/L (ref 135–145)
Total Bilirubin: 0.4 mg/dL (ref 0.0–1.2)
Total Protein: 7.1 g/dL (ref 6.5–8.1)

## 2023-11-05 LAB — PROTIME-INR
INR: 1.1 (ref 0.8–1.2)
Prothrombin Time: 14.3 s (ref 11.4–15.2)

## 2023-11-05 LAB — GLUCOSE, CAPILLARY: Glucose-Capillary: 93 mg/dL (ref 70–99)

## 2023-11-05 MED ORDER — LIDOCAINE HCL 1 % IJ SOLN
INTRAMUSCULAR | Status: AC
  Filled 2023-11-05: qty 20

## 2023-11-05 MED ORDER — FENTANYL CITRATE (PF) 100 MCG/2ML IJ SOLN
INTRAMUSCULAR | Status: AC
  Filled 2023-11-05: qty 2

## 2023-11-05 MED ORDER — FENTANYL CITRATE (PF) 100 MCG/2ML IJ SOLN
INTRAMUSCULAR | Status: AC | PRN
  Administered 2023-11-05 (×2): 50 ug via INTRAVENOUS

## 2023-11-05 MED ORDER — MIDAZOLAM HCL 2 MG/2ML IJ SOLN
INTRAMUSCULAR | Status: AC
  Filled 2023-11-05: qty 2

## 2023-11-05 MED ORDER — HYDROCODONE-ACETAMINOPHEN 5-325 MG PO TABS: 1.0000 | ORAL_TABLET | ORAL | Status: DC | PRN

## 2023-11-05 MED ORDER — GELATIN ABSORBABLE 12-7 MM EX MISC
CUTANEOUS | Status: AC
  Filled 2023-11-05: qty 1

## 2023-11-05 MED ORDER — SODIUM CHLORIDE 0.9 % IV SOLN: INTRAVENOUS | Status: DC

## 2023-11-05 MED ORDER — MIDAZOLAM HCL 2 MG/2ML IJ SOLN
INTRAMUSCULAR | Status: AC | PRN
  Administered 2023-11-05 (×2): 1 mg via INTRAVENOUS

## 2023-11-05 NOTE — Discharge Instructions (Signed)
 For questions /concerns may call Interventional Radiology at [phone] or  Interventional Radiology clinic [phone]   You may remove your dressing and shower tomorrow afternoon                                                                Moderate Conscious Sedation, Adult, Care After After the procedure, it is common to have: Sleepiness for a few hours. Impaired judgment for a few hours. Trouble with balance. Nausea or vomiting if you eat too soon. Follow these instructions at home: For the time period you were told by your health care provider:  Rest. Do not participate in activities where you could fall or become injured. Do not drive or use machinery. Do not drink alcohol. Do not take sleeping pills or medicines that cause drowsiness. Do not make important decisions or sign legal documents. Do not take care of children on your own. Eating and drinking Follow instructions from your health care provider about what you may eat and drink. Drink enough fluid to keep your urine pale yellow. If you vomit: Drink clear fluids slowly and in small amounts as you are able. Clear fluids include water, ice chips, low-calorie sports drinks, and fruit juice that has water added to it (diluted fruit juice). Eat light and bland foods in small amounts as you are able. These foods include bananas, applesauce, rice, lean meats, toast, and crackers. General instructions Take over-the-counter and prescription medicines only as told by your health care provider. Have a responsible adult stay with you for the time you are told. Do not use any products that contain nicotine or tobacco. These products include cigarettes, chewing tobacco, and vaping devices, such as e-cigarettes. If you need help quitting, ask your health care provider. Return to your normal activities as told by your health care provider. Ask your health care provider what activities are safe for you. Your health care provider may  give you more instructions. Make sure you know what you can and cannot do. Contact a health care provider if: You are still sleepy or having trouble with balance after 24 hours. You feel light-headed. You vomit every time you eat or drink. You get a rash. You have a fever. You have redness or swelling around the IV site. Get help right away if: You have trouble breathing. You start to feel confused at home. These symptoms may be an emergency. Get help right away. Call 911. Do not wait to see if the symptoms will go away. Do not drive yourself to the hospital. This information is not intended to replace advice given to you by your health care provider. Make sure you discuss any questions you have with your health care provider. Document Revised: 02/12/2022 Document Reviewed: 02/12/2022 Elsevier Patient Education  2024 [employer].                                                     Liver Biopsy, Care After After a liver biopsy, it is common to have these things in the area where the biopsy was done. You may: Have pain. Feel sore. Have bruising. You  may also feel tired for a few days. Follow these instructions at home: Medicines Take over-the-counter and prescription medicines only as told by your doctor. If you were prescribed an antibiotic medicine, take it as told by your doctor. Do not stop taking the antibiotic, even if you start to feel better. Do not take medicines that may thin your blood. These medicines include aspirin and ibuprofen. Take them only if your doctor tells you to. If told, take steps to prevent problems with pooping (constipation). You may need to: Drink enough fluid to keep your pee (urine) pale yellow. Take medicines. You will be told what medicines to take. Eat foods that are high in fiber. These include beans, whole grains, and fresh fruits and vegetables. Limit foods that are high in fat and sugar. These include fried or sweet foods. Ask your doctor if  you should avoid driving or using machines while you are taking your medicine. Caring for your incision Follow instructions from your doctor about how to take care of your cut from surgery (incisions). Make sure you: Wash your hands with soap and water for at least 20 seconds before and after you change your bandage. If you cannot use soap and water, use hand sanitizer. Change your bandage. Leavestitches or skin glue in place for at least two weeks. Leave tape strips alone unless you are told to take them off. You may trim the edges of the tape strips if they curl up. Check your incision every day for signs of infection. Check for: Redness, swelling, or more pain. Fluid or blood. Warmth. Pus or a bad smell. Do not take baths, swim, or use a hot tub. Ask your doctor about taking showers or sponge baths. Activity Rest at home for 1-2 days, or as told by your doctor. Get up to take short walks every 1 to 2 hours. Ask for help if you feel weak or unsteady. Do not lift anything that is heavier than 10 lb (4.5 kg), or the limit that you are told. Do not play contact sports for 2 weeks after the procedure. Return to your normal activities as told by your doctor. Ask what activities are safe for you. General instructions  Do not drink alcohol in the first week after the procedure. Plan to have a responsible adult care for you for the time you are told after you leave the hospital or clinic. This is important. It is up to you to get the results of your procedure. Ask how to get your results when they are ready. Keep all follow-up visits. Contact a doctor if: You have more bleeding in your incision. Your incision swells, or is red and more painful. You have fluid that comes from your incision. You develop a rash. You have fever or chills. Get help right away if: You have swelling, bloating, or pain in your belly (abdomen). You get dizzy or faint. You vomit or you feel like vomiting. You have  trouble breathing or feel short of breath. You have chest pain. You have problems talking or seeing. You have trouble with your balance or moving your arms or legs. These symptoms may be an emergency. Get help right away. Call your local emergency services (911 in the [place] Do not wait to see if the symptoms will go away. Do not drive yourself to the hospital. Summary After the procedure, it is common to have pain, soreness, bruising, and tiredness. Your doctor will tell you how to take care of yourself at home.  Change your bandage, take your medicines, and limit your activities as told by your doctor. Call your doctor if you have symptoms of infection. Get help right away if your belly swells, your cut bleeds a lot, or you have trouble talking or breathing. This information is not intended to replace advice given to you by your health care provider. Make sure you discuss any questions you have with your health care provider. Document Revised: 06/13/2020 Document Reviewed: 06/13/2020 Elsevier Patient Education  2024 [employer].

## 2023-11-05 NOTE — Procedures (Signed)
 Vascular and Interventional Radiology Procedure Note  Patient: Joshua Nelson DOB: 09-02-1950 Medical Record Number: [id number] Note Date/Time: 11/05/23 12:47 PM   Performing Physician: Roanna Banning, MD Assistant(s): None  Diagnosis: Liver masses. Hx rectal CA   Procedure: LIVER MASS BIOPSY  Anesthesia: Conscious Sedation Complications: None Estimated Blood Loss: Minimal Specimens: Sent for Pathology  Findings:  Successful Ultrasound-guided biopsy of liver mass. A total of 3 samples were obtained. Hemostasis of the tract was achieved using Manual Pressure.  Plan: Bed rest for 2 hours.  See detailed procedure note with images in PACS. The patient tolerated the procedure well without incident or complication and was returned to Recovery in stable condition.    Roanna Banning, MD [facility] Radiology   Pager. [phone] Clinic. [phone]

## 2023-11-05 NOTE — H&P (Signed)
 Chief Complaint: Rectal cancer, liver lesions; referred for image guided liver lesion biopsy  Referring Provider(s): Lewis,D  Supervising Physician: Roanna Banning  Patient Status: [facility] - Out-pt  History of Present Illness: Joshua Nelson is a 73 y.o. male ex smoker history significant for arthritis, CVA, coronary artery disease with prior MI/stenting, diabetes, hypertension, prostate cancer and recently diagnosed rectal cancer.  CT chest abdomen pelvis on 10/22/2023 revealed:  1. Asymmetric low rectal wall thickening extending from the 3 o'clock to the 9 o'clock position and measuring up to 15 mm in thickness, compatible with known rectal cancer. 2. Round prominent mesorectal and presacral lymph nodes, suspicious for local nodal metastasis. 3. Subtle 7 mm hypodensity in the anterior hepatic dome, nonspecific suggest more definitive assessment by hepatic protocol MRI with without contrast. 4. No evidence of metastatic disease in the chest. 5. Mild symmetric wall thickening of a nondistended urinary bladder, correlate with urinalysis to exclude cystitis. 6. Colonic stool burden compatible with constipation. 7. Aortic atherosclerosis.  Subsequent MRI of the liver performed on 10/29/2023 revealed:  1. Numerous (on the order of 30) tiny lesions scattered through both hepatic lobes. These restrict diffusion and do not have T2 imaging characteristics compatible with cysts or benign biliary hamartomas. Some lesions show peripheral enhancement. Imaging features are highly suspicious for metastatic disease. 2. Upper normal lymph nodes in the hepatoduodenal ligament and gastrohepatic ligament. These are nonspecific but could be metastatic. Attention on follow-up recommended. 3. No other acute findings in the abdomen.  Patient presents today for image guided liver lesion biopsy for further evaluation.    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   PONV (postoperative nausea and vomiting)    Prostate cancer (HCC) 07/2021    Past Surgical History:  Procedure Laterality Date   CARDIAC CATHETERIZATION     THREE CARDIAC STENTS PLACED   COLONOSCOPY W/ POLYPECTOMY     CYSTOSCOPY WITH URETHRAL DILATATION N/A 08/01/2021   Procedure: CYSTOSCOPY WITH BALLOON URETHRAL DILATATION;  Surgeon: Noel Christmas, MD;  Location: [facility] ORS;  Service: Urology;  Laterality: N/A;   left cataract  10/2019   NOSE SURGERY     RESULT OF DOG BITE DURING CHILDHOOD   PROSTATE BIOPSY     TRANSURETHRAL RESECTION OF BLADDER TUMOR N/A 08/01/2021   Procedure: TRANSURETHRAL RESECTION OF PROSTATE;  Surgeon: Noel Christmas, MD;  Location: [facility] ORS;  Service: Urology;  Laterality: N/A;  90 MINS   URETHRAL STRICTURE DILATATION      Allergies: Patient has no known allergies.  Medications: Prior to Admission medications   Medication Sig Start Date End Date Taking? Authorizing Provider  ezetimibe (ZETIA) 10 MG tablet TAKE ONE (1) TABLET ONCE DAILY 01/18/21  Yes Cox, Kirsten, MD  FARXIGA 10 MG TABS tablet TAKE 1 TABLET BY MOUTH ONCE DAILY IN THE MORNING AS DIRECTED. 10/17/23  Yes Cox, Kirsten, MD  gabapentin (NEURONTIN) 300 MG capsule Take 1 capsule (300 mg total) by mouth 3 (three) times daily. 08/22/23  Yes Cox, Kirsten, MD  lisinopril (ZESTRIL) 5 MG tablet Once daily in the am 06/18/22  Yes Cox, Kirsten,  MD  metoprolol succinate (TOPROL-XL) 25 MG 24 hr tablet TAKE 1 TABLET BY MOUTH ONCE DAILY IN THE MORNING. 09/02/23  Yes Cox, Kirsten, MD  rosuvastatin (CRESTOR) 40 MG tablet Take 1 tablet (40 mg total) by mouth at bedtime. 03/12/23  Yes Cox,  Fritzi Mandes, MD  traMADol Janean Sark) 50 MG tablet 1-2 tabs po q6h prn pain 10/30/23  Yes Lewis, Dequincy A, MD  aspirin EC 81 MG tablet Take 81 mg by mouth daily. Swallow whole.    [provider]  Continuous Glucose Sensor (FREESTYLE LIBRE 2 SENSOR) MISC Change sensor every 10 days as directed, 05/29/23   Cox, Kirsten, MD  insulin aspart (FIASP FLEXTOUCH) 100 UNIT/ML FlexTouch Pen Inject 16 Units into the skin 2 (two) times daily. With breakfast and supper 08/22/23   Cox, Fritzi Mandes, MD  LANTUS SOLOSTAR 100 UNIT/ML Solostar Pen Inject 65 Units into the skin daily. Pt takes in the pm per wife 02/22/22   Cox, Fritzi Mandes, MD  Meclizine HCl 25 MG CHEW TAKE 1 TABLET THREE TIMES DAILY AS NEEDED FOR DIZZINESS 05/10/22   Cox, Kirsten, MD  Semaglutide, 2 MG/DOSE, (OZEMPIC, 2 MG/DOSE,) 8 MG/3ML SOPN Inject 2 mg into the skin once a week. 08/22/23   CoxFritzi Mandes, MD     Family History  Problem Relation Age of Onset   Endometrial cancer Mother    CAD Mother    AAA (abdominal aortic aneurysm) Mother    Dementia Father    Anxiety disorder Father    Depression Father    Heart disease Father    Hypertension Father    Deep vein thrombosis Father    Kidney Stones Sister        H/O : INTESTINAL BLOCKAGE   Barrett's esophagus Sister    Hyperlipidemia Sister    Kidney Stones Brother    Heart attack Brother    Colon cancer Neg Hx    Esophageal cancer Neg Hx    Liver cancer Neg Hx    Stomach cancer Neg Hx     Social History   Socioeconomic History   Marital status: Married    Spouse name: Olegario Messier   Number of children: 1   Years of education: 12   Highest education level: High school graduate  Occupational History   Occupation: RETIRED - TRUCK DRIVER  Tobacco Use   Smoking status: Former    Current packs/day: 0.00    Types: Cigarettes    Quit date: 2003    Years since quitting: 22.2   Smokeless tobacco: Never  Vaping Use   Vaping status: Never Used  Substance and Sexual Activity   Alcohol use: Not  Currently    Comment: none since 2000   Drug use: Never   Sexual activity: Not Currently  Other Topics Concern   Not on file  Social History Narrative   Lives at home with his wife.   2 cups caffeine per day.   Right-handed.   Social Drivers of Corporate investment banker Strain: Low Risk  (03/21/2023)   Overall Financial Resource Strain (CARDIA)    Difficulty of Paying Living Expenses: Not hard at all  Food Insecurity: No Food Insecurity (10/28/2023)   Hunger Vital Sign    Worried About Running Out of Food in the Last Year: Never true    Ran Out of Food in the Last Year: Never true  Transportation Needs: No Transportation Needs (10/28/2023)   PRAPARE - Administrator, Civil Service (Medical): No    Lack  of Transportation (Non-Medical): No  Physical Activity: Insufficiently Active (03/21/2023)   Exercise Vital Sign    Days of Exercise per Week: 7 days    Minutes of Exercise per Session: 20 min  Stress: No Stress Concern Present (03/21/2023)   [employer] of Occupational Health - Occupational Stress Questionnaire    Feeling of Stress : Not at all  Social Connections: Socially Integrated (03/21/2023)   Social Connection and Isolation Panel [NHANES]    Frequency of Communication with Friends and Family: More than three times a week    Frequency of Social Gatherings with Friends and Family: More than three times a week    Attends Religious Services: More than 4 times per year    Active Member of [employer] or Organizations: Yes    Attends Engineer, structural: More than 4 times per year    Marital Status: Married       Review of Systems denies fever, headache, chest pain, dyspnea, cough, back pain, nausea, vomiting.  He does have abdominal discomfort, some rectal bleeding  Vital Signs: BP (!) 140/78 (BP Location: Right Arm)   Pulse 69   Temp 98.3 F (36.8 C) (Oral)   Resp 15   Ht 5\' 9"  (1.753 m)   Wt 190 lb (86.2 kg)   SpO2 98%   BMI 28.06 kg/m    Advance Care Plan: No documents on file    Physical Exam awake, alert.  Chest clear to auscultation bilaterally.  Heart with regular rate and rhythm.  Abdomen soft, positive bowel sounds, currently nontender.  Trace to 1+ pretibial edema bilaterally.  Imaging: MR LIVER W WO CONTRAST Result Date: 10/30/2023 CLINICAL DATA:  7 mm lesion anterior hepatic dome seen on CT imaging. Personal history of rectal cancer. EXAM: MRI ABDOMEN WITHOUT AND WITH CONTRAST TECHNIQUE: Multiplanar multisequence MR imaging of the abdomen was performed both before and after the administration of intravenous contrast. CONTRAST:  10 cc Eovist COMPARISON:  CT scan 10/22/2023 FINDINGS: Lower chest: Unremarkable. Hepatobiliary: Numerous (on the order of 30) tiny lesions are seen scattered through both hepatic lobes ranging in size from 7 mm up to a maximum size of approximately 10 mm (see axial diffusion series 16 and axial T2 series 14). These restrict diffusion and do not have T2 imaging characteristics compatible with cysts or benign biliary hamartomas. After IV contrast administration some lesion show peripheral enhancement. 20 minute delayed hepatocyte phase imaging shows no accumulation of contrast within the lesions. There is no evidence for gallstones, gallbladder wall thickening, or pericholecystic fluid. No intrahepatic or extrahepatic biliary dilation. Pancreas: No focal mass lesion. No dilatation of the main duct. No intraparenchymal cyst. No peripancreatic edema. Spleen:  No splenomegaly. No suspicious focal mass lesion. Adrenals/Urinary Tract: No adrenal nodule or mass. Kidneys unremarkable. Stomach/Bowel: Stomach is unremarkable. No gastric wall thickening. No evidence of outlet obstruction. Duodenum is normally positioned as is the ligament of Treitz. No small bowel or colonic dilatation within the visualized abdomen. Vascular/Lymphatic: No abdominal aortic aneurysm. No retroperitoneal or mesenteric lymphadenopathy.  Upper normal lymph nodes are seen in the hepatoduodenal ligament and gastrohepatic ligament. Other:  No intraperitoneal free fluid. Musculoskeletal: No focal suspicious marrow enhancement within the visualized bony anatomy. IMPRESSION: 1. Numerous (on the order of 30) tiny lesions scattered through both hepatic lobes. These restrict diffusion and do not have T2 imaging characteristics compatible with cysts or benign biliary hamartomas. Some lesions show peripheral enhancement. Imaging features are highly suspicious for metastatic disease. 2. Upper normal lymph  nodes in the hepatoduodenal ligament and gastrohepatic ligament. These are nonspecific but could be metastatic. Attention on follow-up recommended. 3. No other acute findings in the abdomen. Electronically Signed   By: Kennith Center M.D.   On: 10/30/2023 08:54   CT CHEST ABDOMEN PELVIS W CONTRAST Result Date: 10/23/2023 CLINICAL DATA:  Rectal cancer, staging.  * Tracking Code: BO * EXAM: CT CHEST, ABDOMEN, AND PELVIS WITH CONTRAST TECHNIQUE: Multidetector CT imaging of the chest, abdomen and pelvis was performed following the standard protocol during bolus administration of intravenous contrast. RADIATION DOSE REDUCTION: This exam was performed according to the departmental dose-optimization program which includes automated exposure control, adjustment of the mA and/or kV according to patient size and/or use of iterative reconstruction technique. CONTRAST:  30mL OMNIPAQUE IOHEXOL 300 MG/ML SOLN, OMNIPAQUE IOHEXOL 300 MG/ML SOLN COMPARISON:  CT December 07, 2014 FINDINGS: CT CHEST FINDINGS Cardiovascular: Aortic atherosclerosis. Normal caliber thoracic aorta. Normal size heart. Three-vessel coronary artery calcifications. Mediastinum/Nodes: No suspicious thyroid nodule. No pathologically enlarged mediastinal, hilar or axillary lymph nodes. The esophagus is grossly unremarkable. Lungs/Pleura: No suspicious pulmonary nodules or masses. No focal airspace  consolidation. No pleural effusion. No pneumothorax. Musculoskeletal: No aggressive lytic or blastic lesion of bone. Multilevel degenerative changes spine. CT ABDOMEN PELVIS FINDINGS Hepatobiliary: Subtle 7 mm hypodensity in the anterior hepatic dome on image 50/2. Gallbladder is unremarkable. No biliary ductal dilation. Pancreas: No pancreatic ductal dilation or evidence of acute inflammation. Spleen: No splenomegaly. Adrenals/Urinary Tract: Bilateral adrenal glands appear normal. No hydronephrosis. Kidneys demonstrate symmetric enhancement. Mild symmetric wall thickening of a nondistended urinary bladder. Stomach/Bowel: Radiopaque enteric contrast material traverses the ascending colon. Stomach is unremarkable for degree of distension. No pathologic dilation of small or large bowel. Colonic stool burden compatible constipation. Asymmetric low rectal wall thickening extending from the 3 o'clock to the 9 o'clock position and measuring up to 15 mm in thickness on image 123/2. Stranding in the adjacent mesorectal fat. Vascular/Lymphatic: Aortic atherosclerosis. Round prominent mesorectal and presacral lymph nodes. For reference: -mesorectal lymph node measuring mm lymph node on image 117/2. -Presacral lymph node measuring 4 mm on image 103/2 Reproductive: TURP defect in the prostate gland. Other: No significant abdominopelvic free fluid. Musculoskeletal: Lucency in the anterior aspect of the left iliac bone along the SI joint has been present back to 2016 compatible with a benign finding. No aggressive lytic or blastic lesion of bone. Multilevel degenerative changes spine. Degenerative changes bilateral hips. IMPRESSION: 1. Asymmetric low rectal wall thickening extending from the 3 o'clock to the 9 o'clock position and measuring up to 15 mm in thickness, compatible with known rectal cancer. 2. Round prominent mesorectal and presacral lymph nodes, suspicious for local nodal metastasis. 3. Subtle 7 mm hypodensity in the  anterior hepatic dome, nonspecific suggest more definitive assessment by hepatic protocol MRI with without contrast. 4. No evidence of metastatic disease in the chest. 5. Mild symmetric wall thickening of a nondistended urinary bladder, correlate with urinalysis to exclude cystitis. 6. Colonic stool burden compatible with constipation. 7. Aortic atherosclerosis. Electronically Signed   By: Maudry Mayhew M.D.   On: 10/23/2023 16:14    Labs:  CBC: Recent Labs    06/04/23 0839 09/11/23 1512 10/28/23 1021 11/05/23 1130  WBC 7.3 8.3 7.5 7.6  HGB 12.8* 11.2* 10.2* 9.9*  HCT 38.6 33.3* 33.8* 33.0*  PLT 191 206.0 207 207    COAGS: Recent Labs    11/05/23 1130  INR 1.1    BMP: Recent Labs  06/04/23 0839 09/11/23 1512 10/11/23 1143 10/28/23 1021  NA 139 140 142 140  K 4.9 3.9 4.4 4.2  CL 102 105 108 104  CO2 24 28 27 26   GLUCOSE 104* 95 110* 114*  BUN 10 11 10 15   CALCIUM 9.6 9.2 8.6 9.4  CREATININE 0.76 0.76 0.67 1.02  GFRNONAA  --   --   --  >60    LIVER FUNCTION TESTS: Recent Labs    02/11/23 0809 06/04/23 0839 09/11/23 1512 10/28/23 1021  BILITOT 0.6 0.5 0.5 0.3  AST 16 13 14 19   ALT 14 11 11 8   ALKPHOS 65 72 62 71  PROT 6.7 6.9 7.1 7.3  ALBUMIN 4.4 4.5 4.4 4.4    TUMOR MARKERS: Recent Labs    10/28/23 1021  CEA 13.98*    Assessment and Plan: 73 y.o. male ex smoker history significant for arthritis, CVA, coronary artery disease with prior MI/stenting, diabetes, hypertension, prostate cancer and recently diagnosed rectal cancer.  CT chest abdomen pelvis on 10/22/2023 revealed:  1. Asymmetric low rectal wall thickening extending from the 3 o'clock to the 9 o'clock position and measuring up to 15 mm in thickness, compatible with known rectal cancer. 2. Round prominent mesorectal and presacral lymph nodes, suspicious for local nodal metastasis. 3. Subtle 7 mm hypodensity in the anterior hepatic dome, nonspecific suggest more definitive assessment by  hepatic protocol MRI with without contrast. 4. No evidence of metastatic disease in the chest. 5. Mild symmetric wall thickening of a nondistended urinary bladder, correlate with urinalysis to exclude cystitis. 6. Colonic stool burden compatible with constipation. 7. Aortic atherosclerosis.  Subsequent MRI of the liver performed on 10/29/2023 revealed:  1. Numerous (on the order of 30) tiny lesions scattered through both hepatic lobes. These restrict diffusion and do not have T2 imaging characteristics compatible with cysts or benign biliary hamartomas. Some lesions show peripheral enhancement. Imaging features are highly suspicious for metastatic disease. 2. Upper normal lymph nodes in the hepatoduodenal ligament and gastrohepatic ligament. These are nonspecific but could be metastatic. Attention on follow-up recommended. 3. No other acute findings in the abdomen.  Patient presents today for image guided liver lesion biopsy for further evaluation.Risks and benefits of procedure was discussed with the patient /sister including, but not limited to bleeding, infection, damage to adjacent structures or low yield requiring additional tests.  All of the questions were answered and there is agreement to proceed.  Consent signed and in chart.    Thank you for allowing our service to participate in Joshua Nelson 's care.  Electronically Signed: D. Jeananne Rama, PA-C   11/05/2023, 12:04 PM      I spent a total of  30 Minutes   in face to face in clinical consultation, greater than 50% of which was counseling/coordinating care for image guided liver lesion biopsy

## 2023-11-06 LAB — SURGICAL PATHOLOGY

## 2023-11-13 NOTE — Progress Notes (Unsigned)
 [facility]  [address] [place],  [place]  [address] [phone]  Clinic Day:  11/14/2023  Referring physician: Blane Ohara, MD   HISTORY OF PRESENT ILLNESS:  The patient is a 73 y.o. male  who clinically appears to have metastatic rectal cancer, including spread of disease to his liver.  An abdominal MRI unexpectedly showed disseminated small lesions throughout his liver that were highly worrisome for hepatic metastases.  He comes in today to go over his liver biopsy results and their implications.  Since his last visit, the patient has been doing fairly well.  However, as it pertains to his rectal cancer, the patient continues to have rectal bleeding, which is present in 8 out of every 10 bowel movements he has.    PHYSICAL EXAM:  Blood pressure (!) 150/68, pulse 75, temperature 98.4 F (36.9 C), temperature source Oral, resp. rate 16, height 5\' 9"  (1.753 m), weight 188 lb 14.4 oz (85.7 kg), SpO2 99%. Wt Readings from Last 3 Encounters:  11/14/23 188 lb 14.4 oz (85.7 kg)  11/05/23 190 lb (86.2 kg)  10/30/23 190 lb (86.2 kg)   Body mass index is 27.9 kg/m. Performance status (ECOG): 0 - Asymptomatic Physical Exam Constitutional:      Appearance: Normal appearance. He is not ill-appearing.  HENT:     Mouth/Throat:     Mouth: Mucous membranes are moist.     Pharynx: Oropharynx is clear. No oropharyngeal exudate or posterior oropharyngeal erythema.  Cardiovascular:     Rate and Rhythm: Normal rate and regular rhythm.     Heart sounds: No murmur heard.    No friction rub. No gallop.  Pulmonary:     Effort: Pulmonary effort is normal. No respiratory distress.     Breath sounds: Normal breath sounds. No wheezing, rhonchi or rales.  Abdominal:     General: Bowel sounds are normal. There is no distension.     Palpations: Abdomen is soft. There is no mass.     Tenderness: There is no abdominal tenderness.  Musculoskeletal:        General: No  swelling.     Right lower leg: No edema.     Left lower leg: No edema.  Lymphadenopathy:     Cervical: No cervical adenopathy.     Upper Body:     Right upper body: No supraclavicular or axillary adenopathy.     Left upper body: No supraclavicular or axillary adenopathy.     Lower Body: No right inguinal adenopathy. No left inguinal adenopathy.  Skin:    General: Skin is warm.     Coloration: Skin is not jaundiced.     Findings: No lesion or rash.  Neurological:     General: No focal deficit present.     Mental Status: He is alert and oriented to person, place, and time. Mental status is at baseline.  Psychiatric:        Mood and Affect: Mood normal.        Behavior: Behavior normal.        Thought Content: Thought content normal.    LABS:      Latest Ref Rng & Units 11/05/2023   11:30 AM 10/28/2023   10:21 AM 09/11/2023    3:12 PM  CBC  WBC 4.0 - 10.5 K/uL 7.6  7.5  8.3   Hemoglobin 13.0 - 17.0 g/dL 9.9  91.4  78.2   Hematocrit 39.0 - 52.0 % 33.0  33.8  33.3  Platelets 150 - 400 K/uL 207  207  206.0       Latest Ref Rng & Units 11/05/2023   11:30 AM 10/28/2023   10:21 AM 10/11/2023   11:43 AM  CMP  Glucose 70 - 99 mg/dL 161  096  045   BUN 8 - 23 mg/dL 13  15  10    Creatinine 0.61 - 1.24 mg/dL 4.09  8.11  9.14   Sodium 135 - 145 mmol/L 137  140  142   Potassium 3.5 - 5.1 mmol/L 4.1  4.2  4.4   Chloride 98 - 111 mmol/L 104  104  108   CO2 22 - 32 mmol/L 25  26  27    Calcium 8.9 - 10.3 mg/dL 9.2  9.4  8.6   Total Protein 6.5 - 8.1 g/dL 7.1  7.3    Total Bilirubin 0.0 - 1.2 mg/dL 0.4  0.3    Alkaline Phos 38 - 126 U/L 60  71    AST 15 - 41 U/L 18  19    ALT 0 - 44 U/L 15  8     Lab Results  Component Value Date   CEA 13.98 (H) 10/28/2023   PATHOLOGY:   His ultrasound-guided biopsy revealed the following: LIVER, MASS, CORE BIOPSY  Metastatic moderately differentiated adenocarcinoma cytohistologically  consistent with rectal primary   ---------------------------------------------------------------------------------- The pathologic results from his colonoscopy reveled the following: FINAL DIAGNOSIS        1. Colon, polyp(s), transverse, ascending, and cecum (6) :       - TUBULAR ADENOMA(S)       - NEGATIVE FOR HIGH-GRADE DYSPLASIA OR MALIGNANCY        2. Sigmoid  Colon Polyp, (2) :       - TUBULAR ADENOMA(S)       - NEGATIVE FOR HIGH-GRADE DYSPLASIA OR MALIGNANCY        3. Rectum, biopsy,  :       - AT LEAST INTRAMUCOSAL ADENOCARCINOMA, SEE NOTE        Diagnosis Note : Depth of invasion cannot be judged due to the superficial       nature of the biopsies.   STUDIES:  His abdominal MRI revealed the following:  FINDINGS: Lower chest: Unremarkable.   Hepatobiliary: Numerous (on the order of 30) tiny lesions are seen scattered through both hepatic lobes ranging in size from 7 mm up to a maximum size of approximately 10 mm (see axial diffusion series 16 and axial T2 series 14). These restrict diffusion and do not have T2 imaging characteristics compatible with cysts or benign biliary hamartomas. After IV contrast administration some lesion show peripheral enhancement. 20 minute delayed hepatocyte phase imaging shows no accumulation of contrast within the lesions.   There is no evidence for gallstones, gallbladder wall thickening, or pericholecystic fluid. No intrahepatic or extrahepatic biliary dilation.   Pancreas: No focal mass lesion. No dilatation of the main duct. No intraparenchymal cyst. No peripancreatic edema.   Spleen:  No splenomegaly. No suspicious focal mass lesion.   Adrenals/Urinary Tract: No adrenal nodule or mass. Kidneys unremarkable.   Stomach/Bowel: Stomach is unremarkable. No gastric wall thickening. No evidence of outlet obstruction. Duodenum is normally positioned as is the ligament of Treitz. No small bowel or colonic dilatation within the visualized abdomen.    Vascular/Lymphatic: No abdominal aortic aneurysm. No retroperitoneal or mesenteric lymphadenopathy. Upper normal lymph nodes are seen in the hepatoduodenal ligament and gastrohepatic ligament.   Other:  No  intraperitoneal free fluid.   Musculoskeletal: No focal suspicious marrow enhancement within the visualized bony anatomy.   IMPRESSION: 1. Numerous (on the order of 30) tiny lesions scattered through both hepatic lobes. These restrict diffusion and do not have T2 imaging characteristics compatible with cysts or benign biliary hamartomas. Some lesions show peripheral enhancement. Imaging features are highly suspicious for metastatic disease. 2. Upper normal lymph nodes in the hepatoduodenal ligament and gastrohepatic ligament. These are nonspecific but could be metastatic. Attention on follow-up recommended. 3. No other acute findings in the abdomen.  ASSESSMENT & PLAN:  A 73 y.o. male who clinically has stage IVA (T3 N2 M1a) rectal cancer.  In clinic today, I went over this gentleman's liver biopsy results with him and his family, which they understand this solidifies his stage IV diagnosis.  He understands that he is ultimately dealing with incurable disease.  However, I will give him the best possible palliative therapy to keep his disease under control for as long as possible.  Some of this gentleman's rectal cancer tissue has already been sent to [facility] to determine if it has any mutations/alterations or if there is microsatellite instability present to where something else other than chemotherapy can be given initially.  If not, the patient understands he would be given FOLFOX/Avastin for frontline therapy.  Due to his persistent rectal bleeding, I will have him see radiation oncology in the forthcoming days so they may begin planning for his palliative rectal cancer radiation.  This will hopefully lead to an eradication of his rectal bleeding more immediately.  After this is  completed, then I will start him on his first line of palliative systemic therapy.  I will see this patient back in 3 weeks.  Hopefully, by then, he will be nearing the end of his palliative rectal cancer radiation to where his systemic therapy can commence at that time.  The patient understands all the plans discussed today and is in agreement with them.  Chevis Weisensel Kirby Funk, MD

## 2023-11-14 ENCOUNTER — Inpatient Hospital Stay: Admitting: Oncology

## 2023-11-14 ENCOUNTER — Telehealth: Payer: Self-pay

## 2023-11-14 VITALS — BP 150/68 | HR 75 | Temp 98.4°F | Resp 16 | Ht 69.0 in | Wt 188.9 lb

## 2023-11-14 DIAGNOSIS — C787 Secondary malignant neoplasm of liver and intrahepatic bile duct: Secondary | ICD-10-CM | POA: Diagnosis not present

## 2023-11-14 DIAGNOSIS — C20 Malignant neoplasm of rectum: Secondary | ICD-10-CM | POA: Insufficient documentation

## 2023-11-14 DIAGNOSIS — Z510 Encounter for antineoplastic radiation therapy: Secondary | ICD-10-CM | POA: Diagnosis not present

## 2023-11-14 NOTE — Telephone Encounter (Signed)
 Copied from CRM [id number]. Topic: Clinical - Medication Question >> Nov 13, 2023 12:04 PM Marland Kitchen D wrote: Freestyle libre expired yesterday. He didn't have a extra sensor he had to throw his away when he got the MRI. Wanting to know if he can get one from the office.

## 2023-11-14 NOTE — Telephone Encounter (Signed)
 Patient's wife called to check on previous message. Informed of Katherina's message. Wife verbalized understanding

## 2023-11-18 ENCOUNTER — Ambulatory Visit
Admission: RE | Admit: 2023-11-18 | Discharge: 2023-11-18 | Disposition: A | Discharge status: Home / Self Care | Source: Ambulatory Visit | Attending: Radiation Oncology | Admitting: Radiation Oncology

## 2023-11-18 ENCOUNTER — Encounter: Payer: Self-pay | Admitting: Radiation Oncology

## 2023-11-18 VITALS — BP 131/82 | HR 79 | Temp 98.4°F | Resp 16 | Ht 69.0 in | Wt 187.5 lb

## 2023-11-18 DIAGNOSIS — C20 Malignant neoplasm of rectum: Secondary | ICD-10-CM | POA: Diagnosis not present

## 2023-11-20 ENCOUNTER — Ambulatory Visit
Admission: RE | Admit: 2023-11-20 | Discharge: 2023-11-20 | Disposition: A | Discharge status: Home / Self Care | Source: Ambulatory Visit | Attending: Radiation Oncology | Admitting: Radiation Oncology

## 2023-11-20 ENCOUNTER — Other Ambulatory Visit: Payer: Self-pay | Admitting: Family Medicine

## 2023-11-20 DIAGNOSIS — Z510 Encounter for antineoplastic radiation therapy: Secondary | ICD-10-CM | POA: Insufficient documentation

## 2023-11-20 DIAGNOSIS — C20 Malignant neoplasm of rectum: Secondary | ICD-10-CM | POA: Insufficient documentation

## 2023-11-20 DIAGNOSIS — C787 Secondary malignant neoplasm of liver and intrahepatic bile duct: Secondary | ICD-10-CM | POA: Insufficient documentation

## 2023-11-20 DIAGNOSIS — E1142 Type 2 diabetes mellitus with diabetic polyneuropathy: Secondary | ICD-10-CM

## 2023-11-22 DIAGNOSIS — Z510 Encounter for antineoplastic radiation therapy: Secondary | ICD-10-CM | POA: Diagnosis not present

## 2023-11-22 DIAGNOSIS — C20 Malignant neoplasm of rectum: Secondary | ICD-10-CM | POA: Diagnosis not present

## 2023-11-22 NOTE — Progress Notes (Signed)
 Radiation Oncology         [phone] ________________________________  Name: Joshua Nelson        MRN: [id number]  Date of Service: 11/18/2023 DOB: Oct 23, 1950  CC:Cox, Fritzi Mandes, MD  Blane Ohara, MD     REFERRING PHYSICIAN: Rennis Harding, MD  DIAGNOSIS: Adenocarcinoma of the rectum, metastatic   HISTORY OF PRESENT ILLNESS: Joshua Nelson is a 73 y.o. male seen at the request of Dr. Melvyn Neth.  Last month, he was diagnosed with adenocarcinoma of the rectum; staging studies done revealed a small hepatic lesion.  Ultimately, this lesion was biopsied, revealing metastatic carcinoma.  He has been seen in consultation by Dr. Melvyn Neth, and systemic treatment options have been discussed.  Unfortunately, the patient has a significant amount of rectal bleeding, reportedly passing blood with each bowel movement.  These bowel movements number as frequently as 6 to 7/day.  Consultation is requested regarding the potential role of palliative radiation in his care.   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   PONV (postoperative nausea and vomiting)    Prostate cancer (HCC) 07/2021       PAST SURGICAL HISTORY: Past Surgical History:  Procedure Laterality Date   CARDIAC CATHETERIZATION     THREE CARDIAC STENTS PLACED   COLONOSCOPY W/ POLYPECTOMY     CYSTOSCOPY WITH URETHRAL DILATATION N/A 08/01/2021   Procedure: CYSTOSCOPY WITH BALLOON URETHRAL DILATATION;  Surgeon: Noel Christmas, MD;  Location: [facility];  Service: Urology;  Laterality: N/A;   left cataract  10/2019   NOSE SURGERY     RESULT OF  DOG BITE DURING CHILDHOOD   PROSTATE BIOPSY     TRANSURETHRAL RESECTION OF BLADDER TUMOR N/A 08/01/2021   Procedure: TRANSURETHRAL RESECTION OF PROSTATE;  Surgeon: Noel Christmas, MD;  Location: [facility];  Service: Urology;  Laterality: N/A;  90 MINS   URETHRAL STRICTURE DILATATION       FAMILY HISTORY:  Family History  Problem Relation Age of Onset   Endometrial cancer Mother    CAD Mother    AAA (abdominal aortic aneurysm) Mother    Dementia Father    Anxiety disorder Father    Depression Father    Heart disease Father    Hypertension Father    Deep vein thrombosis Father    Kidney Stones Sister        H/O : INTESTINAL BLOCKAGE   Barrett's esophagus Sister    Hyperlipidemia Sister    Kidney Stones Brother    Heart attack Brother    Colon cancer Neg Hx    Esophageal cancer Neg Hx    Liver cancer Neg Hx    Stomach cancer Neg Hx      SOCIAL HISTORY:  reports that he quit smoking about 22 years ago. His smoking use included cigarettes. He has never used smokeless tobacco. He reports that he does not currently use alcohol. He reports that he does not use drugs.  He lives in [place].  He is seen today with his wife, and sister.   ALLERGIES: Patient has no known allergies.  MEDICATIONS:  Current Outpatient Medications  Medication Sig Dispense Refill   aspirin EC 81 MG tablet Take 81 mg by mouth daily. Swallow whole.     Continuous Glucose Sensor (FREESTYLE LIBRE 2 SENSOR) MISC Change sensor every 10 days as directed, 2 each 5   ezetimibe (ZETIA) 10 MG tablet TAKE ONE (1) TABLET ONCE DAILY 90 tablet 1   FARXIGA 10 MG TABS tablet TAKE 1 TABLET BY MOUTH ONCE DAILY IN THE MORNING AS DIRECTED. 90 tablet 1   gabapentin (NEURONTIN) 300 MG capsule Take 1 capsule (300 mg total) by mouth 3 (three) times daily. 270 capsule 1   insulin aspart (FIASP FLEXTOUCH) 100 UNIT/ML FlexTouch Pen Inject 16 Units into the skin 2 (two) times daily. With breakfast and supper 30 mL 1   LANTUS  SOLOSTAR 100 UNIT/ML Solostar Pen Inject 65 Units into the skin daily. Pt takes in the pm per wife 30 mL 1   lisinopril (ZESTRIL) 5 MG tablet Once daily in the am 90 tablet 0   Meclizine HCl 25 MG CHEW TAKE 1 TABLET THREE TIMES DAILY AS NEEDED FOR DIZZINESS 30 tablet 0   metoprolol succinate (TOPROL-XL) 25 MG 24 hr tablet TAKE 1 TABLET BY MOUTH ONCE DAILY IN THE MORNING. 90 tablet 0   rosuvastatin (CRESTOR) 40 MG tablet Take 1 tablet (40 mg total) by mouth at bedtime. 90 tablet 1   Semaglutide, 2 MG/DOSE, (OZEMPIC, 2 MG/DOSE,) 8 MG/3ML SOPN Inject 2 mg into the skin once a week. 9 mL 0   traMADol (ULTRAM) 50 MG tablet 1-2 tabs po q6h prn pain 50 tablet 0   No current facility-administered medications for this encounter.     REVIEW OF SYSTEMS: On review of systems, the patient reports that he is doing well overall.  He denies any chest pain, shortness of breath, cough, fevers, chills, night sweats, unintended weight changes.  He reports having 6 or 7 bowel movements per day, passing red blood with each bowel movement.  He denies diarrhea.  He reports no fevers or chills.  He reports no nausea or vomiting.  PHYSICAL EXAM:  Wt Readings from Last 3 Encounters:  11/18/23 187 lb 8 oz (85 kg)  11/14/23 188 lb 14.4 oz (85.7 kg)  11/05/23 190 lb (86.2 kg)   Temp Readings from Last 3 Encounters:  11/18/23 98.4 F (36.9 C) (Oral)  11/14/23 98.4 F (36.9 C) (Oral)  11/05/23 98 F (36.7 C) (Oral)   BP Readings from Last 3 Encounters:  11/18/23 131/82  11/14/23 (!) 150/68  11/05/23 (!) 141/75   Pulse Readings from Last 3 Encounters:  11/18/23 79  11/14/23 75  11/05/23 74   Pain Assessment Pain Score: 2  Pain Loc: Rectum/10  In general this is a well appearing male in no acute distress.  He's alert and oriented x4 and appropriate throughout the examination. Cardiopulmonary assessment is negative for acute distress and he exhibits normal effort.  His abdomen is nontender and nondistended.   Extremities are without edema.    ECOG = 1  0 - Asymptomatic (Fully active, able to carry on all predisease activities without restriction)  1 - Symptomatic but completely ambulatory (Restricted in physically strenuous activity but ambulatory and able to carry out work of a light or sedentary nature. For example, light housework, office work)  2 - Symptomatic, <50% in bed during the day (Ambulatory and capable of all self care but unable to carry out any work activities. Up and about more than 50%  of waking hours)  3 - Symptomatic, >50% in bed, but not bedbound (Capable of only limited self-care, confined to bed or chair 50% or more of waking hours)  4 - Bedbound (Completely disabled. Cannot carry on any self-care. Totally confined to bed or chair)  5 - Death   Santiago Glad MM, Creech RH, Tormey DC, et al. [phone]). "Toxicity and response criteria of the [facility]". Am. Evlyn Clines. Oncol. 5 (6): 649-55    LABORATORY DATA:  Lab Results  Component Value Date   WBC 7.6 11/05/2023   HGB 9.9 (L) 11/05/2023   HCT 33.0 (L) 11/05/2023   MCV 86.8 11/05/2023   PLT 207 11/05/2023   Lab Results  Component Value Date   NA 137 11/05/2023   K 4.1 11/05/2023   CL 104 11/05/2023   CO2 25 11/05/2023   Lab Results  Component Value Date   ALT 15 11/05/2023   AST 18 11/05/2023   ALKPHOS 60 11/05/2023   BILITOT 0.4 11/05/2023      RADIOGRAPHY: US BIOPSY (LIVER) Result Date: 11/05/2023 INDICATION: metatstatic liver lesions EXAM: ULTRASOUND GUIDED LIVER MASS BIOPSY COMPARISON:  CT CAP, 10/22/2023.  MR abdomen, 10/29/2023. MEDICATIONS: None ANESTHESIA/SEDATION: Moderate (conscious) sedation was employed during this procedure. A total of Versed 2 mg and Fentanyl 100 mcg was administered intravenously. Moderate Sedation Time: 12 minutes. The patient's level of consciousness and vital signs were monitored continuously by radiology nursing throughout the procedure under my direct  supervision. COMPLICATIONS: None immediate. PROCEDURE: Informed written consent was obtained from the patient and/or patient's representative after a discussion of the risks, benefits and alternatives to treatment. The patient understands and consents the procedure. A timeout was performed prior to the initiation of the procedure. Ultrasound scanning was performed of the right upper abdominal quadrant demonstrates multifocal liver masses A small RIGHT hepatic lobe mass, about the gallbladder fossa was selected for biopsy and the procedure was planned. The right upper abdominal quadrant was prepped and draped in the usual sterile fashion. The overlying soft tissues were anesthetized with 1% lidocaine. A 17 gauge co-axial needle was advanced into a peripheral aspect of the lesion. This was followed by 4 core biopsies with an 18 gauge core device under direct ultrasound guidance. The needle was removed, then superficial hemostasis was obtained with manual compression. The coaxial needle tract was embolized with Gel-Foam slurry, then superficial hemostasis was obtained with manual compression. Post procedural scanning was negative for definitive area of hemorrhage or additional complication. A dressing was placed. The patient tolerated the procedure well without immediate post procedural complication. IMPRESSION: Successful ultrasound-guided core needle biopsy of a liver mass. Roanna Banning, MD [facility] Radiology Electronically Signed   By: Roanna Banning M.D.   On: 11/05/2023 15:40   MR LIVER W WO CONTRAST Result Date: 10/30/2023 CLINICAL DATA:  7 mm lesion anterior hepatic dome seen on CT imaging. Personal history of rectal cancer. EXAM: MRI ABDOMEN WITHOUT AND WITH CONTRAST TECHNIQUE: Multiplanar multisequence MR imaging of the abdomen was performed both before and after the administration of intravenous contrast. CONTRAST:  10 cc Eovist COMPARISON:  CT scan  10/22/2023 FINDINGS: Lower chest: Unremarkable. Hepatobiliary: Numerous (on the order of 30) tiny lesions are seen scattered through both hepatic lobes ranging in size from 7 mm up to a maximum size of approximately 10 mm (see axial diffusion series 16 and axial T2 series 14). These restrict diffusion and do not have T2 imaging characteristics compatible with cysts  or benign biliary hamartomas. After IV contrast administration some lesion show peripheral enhancement. 20 minute delayed hepatocyte phase imaging shows no accumulation of contrast within the lesions. There is no evidence for gallstones, gallbladder wall thickening, or pericholecystic fluid. No intrahepatic or extrahepatic biliary dilation. Pancreas: No focal mass lesion. No dilatation of the main duct. No intraparenchymal cyst. No peripancreatic edema. Spleen:  No splenomegaly. No suspicious focal mass lesion. Adrenals/Urinary Tract: No adrenal nodule or mass. Kidneys unremarkable. Stomach/Bowel: Stomach is unremarkable. No gastric wall thickening. No evidence of outlet obstruction. Duodenum is normally positioned as is the ligament of Treitz. No small bowel or colonic dilatation within the visualized abdomen. Vascular/Lymphatic: No abdominal aortic aneurysm. No retroperitoneal or mesenteric lymphadenopathy. Upper normal lymph nodes are seen in the hepatoduodenal ligament and gastrohepatic ligament. Other:  No intraperitoneal free fluid. Musculoskeletal: No focal suspicious marrow enhancement within the visualized bony anatomy. IMPRESSION: 1. Numerous (on the order of 30) tiny lesions scattered through both hepatic lobes. These restrict diffusion and do not have T2 imaging characteristics compatible with cysts or benign biliary hamartomas. Some lesions show peripheral enhancement. Imaging features are highly suspicious for metastatic disease. 2. Upper normal lymph nodes in the hepatoduodenal ligament and gastrohepatic ligament. These are nonspecific but  could be metastatic. Attention on follow-up recommended. 3. No other acute findings in the abdomen. Electronically Signed   By: Kennith Center M.D.   On: 10/30/2023 08:54       IMPRESSION/PLAN: 1.  The patient is a 73 year old male with metastatic adenocarcinoma of the rectum.  Given that he is frequently passing bright red blood per rectum, I have recommended that we proceed with palliative external beam radiation to his rectum.  I explained the rationale behind this recommendation, as well as the potential side effects of radiation in his clinical scenario.  I explained that these may include, but are not limited to, fatigue, skin irritation, irritation of the bladder, and irritation of the rectum.  The patient expressed understanding, and is agreeable to proceed.  Arrangements will be made for him to return to our department for simulation and treatment planning.   In a visit lasting 45 minutes, greater than 50% of the time was spent face to face discussing the patient's condition, in preparation for the discussion, and coordinating the patient's care.    Denissa Cozart A. Thersa Salt, MD   **Disclaimer: This note was dictated with voice recognition software. Similar sounding words can inadvertently be transcribed and this note may contain transcription errors which may not have been corrected upon publication of note.**

## 2023-11-26 ENCOUNTER — Ambulatory Visit
Admission: RE | Admit: 2023-11-26 | Discharge: 2023-11-26 | Disposition: A | Discharge status: Home / Self Care | Source: Ambulatory Visit | Attending: Radiation Oncology | Admitting: Radiation Oncology

## 2023-11-26 ENCOUNTER — Other Ambulatory Visit: Payer: Self-pay

## 2023-11-26 DIAGNOSIS — Z510 Encounter for antineoplastic radiation therapy: Secondary | ICD-10-CM | POA: Diagnosis not present

## 2023-11-26 DIAGNOSIS — C20 Malignant neoplasm of rectum: Secondary | ICD-10-CM | POA: Diagnosis not present

## 2023-11-26 LAB — RAD ONC ARIA SESSION SUMMARY
Course Elapsed Days: 0
Plan Fractions Treated to Date: 1
Plan Prescribed Dose Per Fraction: 2.5 Gy
Plan Total Fractions Prescribed: 12
Plan Total Prescribed Dose: 30 Gy
Reference Point Dosage Given to Date: 2.5 Gy
Reference Point Session Dosage Given: 2.5 Gy
Session Number: 1

## 2023-11-27 ENCOUNTER — Ambulatory Visit
Admission: RE | Admit: 2023-11-27 | Discharge: 2023-11-27 | Disposition: A | Discharge status: Home / Self Care | Source: Ambulatory Visit | Attending: Radiation Oncology | Admitting: Radiation Oncology

## 2023-11-27 DIAGNOSIS — Z510 Encounter for antineoplastic radiation therapy: Secondary | ICD-10-CM | POA: Diagnosis not present

## 2023-11-28 ENCOUNTER — Ambulatory Visit
Admission: RE | Admit: 2023-11-28 | Discharge: 2023-11-28 | Disposition: A | Discharge status: Home / Self Care | Source: Ambulatory Visit | Attending: Radiation Oncology | Admitting: Radiation Oncology

## 2023-11-28 ENCOUNTER — Other Ambulatory Visit: Payer: Self-pay

## 2023-11-28 DIAGNOSIS — Z510 Encounter for antineoplastic radiation therapy: Secondary | ICD-10-CM | POA: Diagnosis not present

## 2023-11-28 LAB — RAD ONC ARIA SESSION SUMMARY
Course Elapsed Days: 2
Plan Fractions Treated to Date: 3
Plan Prescribed Dose Per Fraction: 2.5 Gy
Plan Total Fractions Prescribed: 12
Plan Total Prescribed Dose: 30 Gy
Reference Point Dosage Given to Date: 7.5 Gy
Reference Point Session Dosage Given: 2.5 Gy
Session Number: 3

## 2023-11-29 ENCOUNTER — Ambulatory Visit
Admission: RE | Admit: 2023-11-29 | Discharge: 2023-11-29 | Disposition: A | Discharge status: Home / Self Care | Source: Ambulatory Visit | Attending: Radiation Oncology | Admitting: Radiation Oncology

## 2023-11-29 ENCOUNTER — Other Ambulatory Visit: Payer: Self-pay

## 2023-11-29 DIAGNOSIS — C20 Malignant neoplasm of rectum: Secondary | ICD-10-CM | POA: Diagnosis not present

## 2023-11-29 DIAGNOSIS — C787 Secondary malignant neoplasm of liver and intrahepatic bile duct: Secondary | ICD-10-CM | POA: Diagnosis not present

## 2023-11-29 DIAGNOSIS — Z510 Encounter for antineoplastic radiation therapy: Secondary | ICD-10-CM | POA: Diagnosis not present

## 2023-11-29 LAB — RAD ONC ARIA SESSION SUMMARY
Course Elapsed Days: 3
Plan Fractions Treated to Date: 4
Plan Prescribed Dose Per Fraction: 2.5 Gy
Plan Total Fractions Prescribed: 12
Plan Total Prescribed Dose: 30 Gy
Reference Point Dosage Given to Date: 10 Gy
Reference Point Session Dosage Given: 2.5 Gy
Session Number: 4

## 2023-12-02 ENCOUNTER — Ambulatory Visit
Admission: RE | Admit: 2023-12-02 | Discharge: 2023-12-02 | Disposition: A | Discharge status: Home / Self Care | Source: Ambulatory Visit | Attending: Radiation Oncology | Admitting: Radiation Oncology

## 2023-12-02 ENCOUNTER — Other Ambulatory Visit: Payer: Self-pay

## 2023-12-02 ENCOUNTER — Encounter (HOSPITAL_COMMUNITY): Payer: Self-pay | Admitting: Oncology

## 2023-12-02 DIAGNOSIS — Z7982 Long term (current) use of aspirin: Secondary | ICD-10-CM | POA: Diagnosis not present

## 2023-12-02 DIAGNOSIS — Z87891 Personal history of nicotine dependence: Secondary | ICD-10-CM | POA: Diagnosis not present

## 2023-12-02 DIAGNOSIS — E119 Type 2 diabetes mellitus without complications: Secondary | ICD-10-CM | POA: Diagnosis not present

## 2023-12-02 DIAGNOSIS — I10 Essential (primary) hypertension: Secondary | ICD-10-CM | POA: Diagnosis not present

## 2023-12-02 DIAGNOSIS — Z8546 Personal history of malignant neoplasm of prostate: Secondary | ICD-10-CM | POA: Diagnosis not present

## 2023-12-02 DIAGNOSIS — R32 Unspecified urinary incontinence: Secondary | ICD-10-CM | POA: Diagnosis not present

## 2023-12-02 DIAGNOSIS — I252 Old myocardial infarction: Secondary | ICD-10-CM | POA: Diagnosis not present

## 2023-12-02 DIAGNOSIS — M1990 Unspecified osteoarthritis, unspecified site: Secondary | ICD-10-CM | POA: Diagnosis not present

## 2023-12-02 DIAGNOSIS — Z95810 Presence of automatic (implantable) cardiac defibrillator: Secondary | ICD-10-CM | POA: Diagnosis not present

## 2023-12-02 DIAGNOSIS — C20 Malignant neoplasm of rectum: Secondary | ICD-10-CM | POA: Diagnosis not present

## 2023-12-02 DIAGNOSIS — Z79899 Other long term (current) drug therapy: Secondary | ICD-10-CM | POA: Diagnosis not present

## 2023-12-02 DIAGNOSIS — R339 Retention of urine, unspecified: Secondary | ICD-10-CM | POA: Diagnosis not present

## 2023-12-02 DIAGNOSIS — Z794 Long term (current) use of insulin: Secondary | ICD-10-CM | POA: Diagnosis not present

## 2023-12-02 DIAGNOSIS — Z9889 Other specified postprocedural states: Secondary | ICD-10-CM | POA: Diagnosis not present

## 2023-12-02 DIAGNOSIS — N410 Acute prostatitis: Secondary | ICD-10-CM | POA: Diagnosis not present

## 2023-12-02 DIAGNOSIS — Z510 Encounter for antineoplastic radiation therapy: Secondary | ICD-10-CM | POA: Diagnosis not present

## 2023-12-02 DIAGNOSIS — I110 Hypertensive heart disease with heart failure: Secondary | ICD-10-CM | POA: Diagnosis not present

## 2023-12-02 DIAGNOSIS — C787 Secondary malignant neoplasm of liver and intrahepatic bile duct: Secondary | ICD-10-CM | POA: Diagnosis not present

## 2023-12-02 DIAGNOSIS — I2510 Atherosclerotic heart disease of native coronary artery without angina pectoris: Secondary | ICD-10-CM | POA: Diagnosis not present

## 2023-12-02 DIAGNOSIS — I5032 Chronic diastolic (congestive) heart failure: Secondary | ICD-10-CM | POA: Diagnosis not present

## 2023-12-02 LAB — RAD ONC ARIA SESSION SUMMARY
Course Elapsed Days: 6
Plan Fractions Treated to Date: 5
Plan Prescribed Dose Per Fraction: 2.5 Gy
Plan Total Fractions Prescribed: 12
Plan Total Prescribed Dose: 30 Gy
Reference Point Dosage Given to Date: 12.5 Gy
Reference Point Session Dosage Given: 2.5 Gy
Session Number: 5

## 2023-12-03 ENCOUNTER — Other Ambulatory Visit: Payer: Self-pay

## 2023-12-03 ENCOUNTER — Ambulatory Visit
Admission: RE | Admit: 2023-12-03 | Discharge: 2023-12-03 | Disposition: A | Discharge status: Home / Self Care | Source: Ambulatory Visit | Attending: Radiation Oncology | Admitting: Radiation Oncology

## 2023-12-03 ENCOUNTER — Ambulatory Visit: Admission: RE | Admit: 2023-12-03 | Source: Ambulatory Visit

## 2023-12-03 ENCOUNTER — Ambulatory Visit: Payer: PPO | Admitting: Podiatry

## 2023-12-03 DIAGNOSIS — Z510 Encounter for antineoplastic radiation therapy: Secondary | ICD-10-CM | POA: Diagnosis not present

## 2023-12-03 LAB — RAD ONC ARIA SESSION SUMMARY
Course Elapsed Days: 7
Plan Fractions Treated to Date: 6
Plan Prescribed Dose Per Fraction: 2.5 Gy
Plan Total Fractions Prescribed: 12
Plan Total Prescribed Dose: 30 Gy
Reference Point Dosage Given to Date: 15 Gy
Reference Point Session Dosage Given: 2.5 Gy
Session Number: 6

## 2023-12-04 ENCOUNTER — Ambulatory Visit
Admission: RE | Admit: 2023-12-04 | Discharge: 2023-12-04 | Disposition: A | Discharge status: Home / Self Care | Source: Ambulatory Visit | Attending: Radiation Oncology | Admitting: Radiation Oncology

## 2023-12-04 ENCOUNTER — Other Ambulatory Visit: Payer: Self-pay

## 2023-12-04 DIAGNOSIS — Z510 Encounter for antineoplastic radiation therapy: Secondary | ICD-10-CM | POA: Diagnosis not present

## 2023-12-04 LAB — RAD ONC ARIA SESSION SUMMARY
Course Elapsed Days: 8
Plan Fractions Treated to Date: 7
Plan Prescribed Dose Per Fraction: 2.5 Gy
Plan Total Fractions Prescribed: 12
Plan Total Prescribed Dose: 30 Gy
Reference Point Dosage Given to Date: 17.5 Gy
Reference Point Session Dosage Given: 2.5 Gy
Session Number: 7

## 2023-12-04 NOTE — Progress Notes (Unsigned)
 [facility]  [address] [place],  [place]  [address] [phone]  Clinic Day:  12/05/2023  Referring physician: Mercy Stall, MD   HISTORY OF PRESENT ILLNESS:  The patient is a 73 y.o. 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  He is currently undergoing palliative radiation to his rectal lesion, which he is near completing.  Since this has started, his rectal bleeding has nearly ceased.  The patient denies having other symptoms which concern him for complications related to his underlying disease.  PHYSICAL EXAM:  Blood pressure 128/70, pulse 83, temperature 98.5 F (36.9 C), temperature source Oral, resp. rate 18, height 5\' 9"  (1.753 m), weight 189 lb (85.7 kg), SpO2 99%. Wt Readings from Last 3 Encounters:  12/05/23 189 lb (85.7 kg)  11/18/23 187 lb 8 oz (85 kg)  11/14/23 188 lb 14.4 oz (85.7 kg)   Body mass index is 27.91 kg/m. Performance status (ECOG): 0 - Asymptomatic Physical Exam Constitutional:      Appearance: Normal appearance. He is not ill-appearing.  HENT:     Mouth/Throat:     Mouth: Mucous membranes are moist.     Pharynx: Oropharynx is clear. No oropharyngeal exudate or posterior oropharyngeal erythema.  Cardiovascular:     Rate and Rhythm: Normal rate and regular rhythm.     Heart sounds: No murmur heard.    No friction rub. No gallop.  Pulmonary:     Effort: Pulmonary effort is normal. No respiratory distress.     Breath sounds: Normal breath sounds. No wheezing, rhonchi or rales.  Abdominal:     General: Bowel sounds are normal. There is no distension.     Palpations: Abdomen is soft. There is no mass.     Tenderness: There is no abdominal tenderness.  Musculoskeletal:        General: No swelling.     Right lower leg: No edema.     Left lower leg: No edema.  Lymphadenopathy:     Cervical: No cervical adenopathy.     Upper Body:     Right upper body: No supraclavicular or  axillary adenopathy.     Left upper body: No supraclavicular or axillary adenopathy.     Lower Body: No right inguinal adenopathy. No left inguinal adenopathy.  Skin:    General: Skin is warm.     Coloration: Skin is not jaundiced.     Findings: No lesion or rash.  Neurological:     General: No focal deficit present.     Mental Status: He is alert and oriented to person, place, and time. Mental status is at baseline.  Psychiatric:        Mood and Affect: Mood normal.        Behavior: Behavior normal.        Thought Content: Thought content normal.    LABS:      Latest Ref Rng & Units 12/05/2023   11:14 AM 11/05/2023   11:30 AM 10/28/2023   10:21 AM  CBC  WBC 4.0 - 10.5 K/uL 6.8  7.6  7.5   Hemoglobin 13.0 - 17.0 g/dL 9.2  9.9  91.4   Hematocrit 39.0 - 52.0 % 30.4  33.0  33.8   Platelets 150 - 400 K/uL 166  207  207       Latest Ref Rng & Units 12/05/2023   11:14 AM 11/05/2023   11:30 AM 10/28/2023   10:21 AM  CMP  Glucose 70 - 99 mg/dL 191  478  295   BUN 8 - 23 mg/dL 13  13  15    Creatinine 0.61 - 1.24 mg/dL 6.21  3.08  6.57   Sodium 135 - 145 mmol/L 139  137  140   Potassium 3.5 - 5.1 mmol/L 4.3  4.1  4.2   Chloride 98 - 111 mmol/L 104  104  104   CO2 22 - 32 mmol/L 26  25  26    Calcium  8.9 - 10.3 mg/dL 9.4  9.2  9.4   Total Protein 6.5 - 8.1 g/dL 6.7  7.1  7.3   Total Bilirubin 0.0 - 1.2 mg/dL 0.3  0.4  0.3   Alkaline Phos 38 - 126 U/L 65  60  71   AST 15 - 41 U/L 18  18  19    ALT 0 - 44 U/L 10  15  8     Lab Results  Component Value Date   CEA 13.98 (H) 10/28/2023   PATHOLOGY:   His ultrasound-guided biopsy revealed the following: LIVER, MASS, CORE BIOPSY  Metastatic moderately differentiated adenocarcinoma cytohistologically  consistent with rectal primary  ---------------------------------------------------------------------------------- The pathologic results from his colonoscopy reveled the following: FINAL DIAGNOSIS        1. Colon, polyp(s), transverse,  ascending, and cecum (6) :       - TUBULAR ADENOMA(S)       - NEGATIVE FOR HIGH-GRADE DYSPLASIA OR MALIGNANCY        2. Sigmoid  Colon Polyp, (2) :       - TUBULAR ADENOMA(S)       - NEGATIVE FOR HIGH-GRADE DYSPLASIA OR MALIGNANCY        3. Rectum, biopsy,  :       - AT LEAST INTRAMUCOSAL ADENOCARCINOMA, SEE NOTE        Diagnosis Note : Depth of invasion cannot be judged due to the superficial       nature of the biopsies.   STUDIES:  His abdominal MRI revealed the following:  FINDINGS: Lower chest: Unremarkable.   Hepatobiliary: Numerous (on the order of 30) tiny lesions are seen scattered through both hepatic lobes ranging in size from 7 mm up to a maximum size of approximately 10 mm (see axial diffusion series 16 and axial T2 series 14). These restrict diffusion and do not have T2 imaging characteristics compatible with cysts or benign biliary hamartomas. After IV contrast administration some lesion show peripheral enhancement. 20 minute delayed hepatocyte phase imaging shows no accumulation of contrast within the lesions.   There is no evidence for gallstones, gallbladder wall thickening, or pericholecystic fluid. No intrahepatic or extrahepatic biliary dilation.   Pancreas: No focal mass lesion. No dilatation of the main duct. No intraparenchymal cyst. No peripancreatic edema.   Spleen:  No splenomegaly. No suspicious focal mass lesion.   Adrenals/Urinary Tract: No adrenal nodule or mass. Kidneys unremarkable.   Stomach/Bowel: Stomach is unremarkable. No gastric wall thickening. No evidence of outlet obstruction. Duodenum is normally positioned as is the ligament of Treitz. No small bowel or colonic dilatation within the visualized abdomen.   Vascular/Lymphatic: No abdominal aortic aneurysm. No retroperitoneal or mesenteric lymphadenopathy. Upper normal lymph nodes are seen in the hepatoduodenal ligament and gastrohepatic ligament.   Other:  No intraperitoneal  free fluid.   Musculoskeletal: No focal suspicious marrow enhancement within the visualized bony anatomy.   IMPRESSION: 1. Numerous (on the order of 30) tiny lesions scattered through both hepatic lobes. These  restrict diffusion and do not have T2 imaging characteristics compatible with cysts or benign biliary hamartomas. Some lesions show peripheral enhancement. Imaging features are highly suspicious for metastatic disease. 2. Upper normal lymph nodes in the hepatoduodenal ligament and gastrohepatic ligament. These are nonspecific but could be metastatic. Attention on follow-up recommended. 3. No other acute findings in the abdomen.  ASSESSMENT & PLAN:  A 73 y.o. male who clinically has stage IVA (T3 N2 M1a) rectal cancer.  As mentioned previously, the patient is nearing the completion of his palliative radiation for his rectal lesion, which has led to a significant decline in his rectal bleeding.  Clinically, the patient is doing well.  Understandably, he wishes to get his palliative chemotherapy started as soon as possible to bring his metastatic disease burden under some semblance of control.  The plan is for him to start FOLFOX/Avastin on Monday, May 5th, with each cycle to be repeated every 2 weeks.  The patient has already been made aware of the side effects which can go along with this regimen, including peripheral neuropathy, cold intolerance, cytopenias, and fatigue.  I will see him back on May 19th before he heads into his second cycle of FOLFOX/Avastin.  The patient understands all the plans discussed today and is in agreement with them.  Adis Sturgill Felicia Horde, MD

## 2023-12-05 ENCOUNTER — Other Ambulatory Visit: Payer: Self-pay

## 2023-12-05 ENCOUNTER — Inpatient Hospital Stay: Admitting: Oncology

## 2023-12-05 ENCOUNTER — Ambulatory Visit
Admission: RE | Admit: 2023-12-05 | Discharge: 2023-12-05 | Disposition: A | Discharge status: Home / Self Care | Source: Ambulatory Visit | Attending: Radiation Oncology

## 2023-12-05 ENCOUNTER — Inpatient Hospital Stay

## 2023-12-05 VITALS — BP 128/70 | HR 83 | Temp 98.5°F | Resp 18 | Ht 69.0 in | Wt 189.0 lb

## 2023-12-05 DIAGNOSIS — C20 Malignant neoplasm of rectum: Secondary | ICD-10-CM | POA: Diagnosis not present

## 2023-12-05 DIAGNOSIS — C787 Secondary malignant neoplasm of liver and intrahepatic bile duct: Secondary | ICD-10-CM | POA: Diagnosis not present

## 2023-12-05 DIAGNOSIS — Z510 Encounter for antineoplastic radiation therapy: Secondary | ICD-10-CM | POA: Diagnosis not present

## 2023-12-05 LAB — CBC WITH DIFFERENTIAL (CANCER CENTER ONLY)
Abs Immature Granulocytes: 0.03 10*3/uL (ref 0.00–0.07)
Basophils Absolute: 0.1 10*3/uL (ref 0.0–0.1)
Basophils Relative: 1 %
Eosinophils Absolute: 0.3 10*3/uL (ref 0.0–0.5)
Eosinophils Relative: 5 %
HCT: 30.4 % — ABNORMAL LOW (ref 39.0–52.0)
Hemoglobin: 9.2 g/dL — ABNORMAL LOW (ref 13.0–17.0)
Immature Granulocytes: 0 %
Lymphocytes Relative: 18 %
Lymphs Abs: 1.2 10*3/uL (ref 0.7–4.0)
MCH: 24.7 pg — ABNORMAL LOW (ref 26.0–34.0)
MCHC: 30.3 g/dL (ref 30.0–36.0)
MCV: 81.7 fL (ref 80.0–100.0)
Monocytes Absolute: 0.6 10*3/uL (ref 0.1–1.0)
Monocytes Relative: 8 %
Neutro Abs: 4.6 10*3/uL (ref 1.7–7.7)
Neutrophils Relative %: 68 %
Platelet Count: 166 10*3/uL (ref 150–400)
RBC: 3.72 MIL/uL — ABNORMAL LOW (ref 4.22–5.81)
RDW: 14.8 % (ref 11.5–15.5)
WBC Count: 6.8 10*3/uL (ref 4.0–10.5)
nRBC: 0 % (ref 0.0–0.2)
nRBC: 0 /100{WBCs}

## 2023-12-05 LAB — CMP (CANCER CENTER ONLY)
ALT: 10 U/L (ref 0–44)
AST: 18 U/L (ref 15–41)
Albumin: 4 g/dL (ref 3.5–5.0)
Alkaline Phosphatase: 65 U/L (ref 38–126)
Anion gap: 9 (ref 5–15)
BUN: 13 mg/dL (ref 8–23)
CO2: 26 mmol/L (ref 22–32)
Calcium: 9.4 mg/dL (ref 8.9–10.3)
Chloride: 104 mmol/L (ref 98–111)
Creatinine: 0.86 mg/dL (ref 0.61–1.24)
GFR, Estimated: >60 mL/min (ref >60)
Glucose, Bld: 130 mg/dL — ABNORMAL HIGH (ref 70–99)
Potassium: 4.3 mmol/L (ref 3.5–5.1)
Sodium: 139 mmol/L (ref 135–145)
Total Bilirubin: 0.3 mg/dL (ref 0.0–1.2)
Total Protein: 6.7 g/dL (ref 6.5–8.1)

## 2023-12-05 LAB — RAD ONC ARIA SESSION SUMMARY
Course Elapsed Days: 9
Plan Fractions Treated to Date: 8
Plan Prescribed Dose Per Fraction: 2.5 Gy
Plan Total Fractions Prescribed: 12
Plan Total Prescribed Dose: 30 Gy
Reference Point Dosage Given to Date: 20 Gy
Reference Point Session Dosage Given: 2.5 Gy
Session Number: 8

## 2023-12-05 NOTE — Progress Notes (Signed)
 START ON PATHWAY REGIMEN - Colorectal     A cycle is every 14 days:     Bevacizumab-xxxx      Oxaliplatin      Leucovorin      Fluorouracil      Fluorouracil   **Always confirm dose/schedule in your pharmacy ordering system**  Patient Characteristics: Distant Metastases, Nonsurgical Candidate, KRAS/NRAS Wild-Type (BRAF V600 Wild-Type/Unknown), Standard Cytotoxic Therapy, First Line Standard Cytotoxic Therapy, Right-sided Tumor, PS = 0,1, Bevacizumab Eligible Tumor Location: Rectal Therapeutic Status: Distant Metastases Microsatellite/Mismatch Repair Status: MSS/pMMR BRAF Mutation Status: Wild-Type (no mutation) KRAS/NRAS Mutation Status: Wild-Type (no mutation) Preferred Therapy Approach: Standard Cytotoxic Therapy Standard Cytotoxic Line of Therapy: First Line Standard Cytotoxic Therapy ECOG Performance Status: 0 Primary Tumor Location: Right-sided Tumor Bevacizumab Eligibility: Eligible Intent of Therapy: Non-Curative / Palliative Intent, Discussed with Patient

## 2023-12-05 NOTE — Progress Notes (Signed)
 1120 LABS DRAWN PERIPHERALLY THE PATIENT IS NOT ACTIVELY GETTING TREATMENT YET. HE JUST NEEDED LABS DRAWN PER DR. Harles Lied.

## 2023-12-06 ENCOUNTER — Ambulatory Visit
Admission: RE | Admit: 2023-12-06 | Discharge: 2023-12-06 | Disposition: A | Discharge status: Home / Self Care | Source: Ambulatory Visit | Attending: Radiation Oncology | Admitting: Radiation Oncology

## 2023-12-06 ENCOUNTER — Other Ambulatory Visit: Payer: Self-pay

## 2023-12-06 ENCOUNTER — Encounter: Payer: Self-pay | Admitting: Oncology

## 2023-12-06 DIAGNOSIS — Z510 Encounter for antineoplastic radiation therapy: Secondary | ICD-10-CM | POA: Diagnosis not present

## 2023-12-06 LAB — RAD ONC ARIA SESSION SUMMARY
Course Elapsed Days: 10
Plan Fractions Treated to Date: 9
Plan Prescribed Dose Per Fraction: 2.5 Gy
Plan Total Fractions Prescribed: 12
Plan Total Prescribed Dose: 30 Gy
Reference Point Dosage Given to Date: 22.5 Gy
Reference Point Session Dosage Given: 2.5 Gy
Session Number: 9

## 2023-12-09 ENCOUNTER — Ambulatory Visit
Admission: RE | Admit: 2023-12-09 | Discharge: 2023-12-09 | Disposition: A | Discharge status: Home / Self Care | Source: Ambulatory Visit | Attending: Radiation Oncology | Admitting: Radiation Oncology

## 2023-12-09 ENCOUNTER — Other Ambulatory Visit: Payer: Self-pay | Admitting: Family Medicine

## 2023-12-09 ENCOUNTER — Other Ambulatory Visit: Payer: Self-pay

## 2023-12-09 DIAGNOSIS — Z510 Encounter for antineoplastic radiation therapy: Secondary | ICD-10-CM | POA: Diagnosis not present

## 2023-12-09 DIAGNOSIS — I119 Hypertensive heart disease without heart failure: Secondary | ICD-10-CM

## 2023-12-09 DIAGNOSIS — C20 Malignant neoplasm of rectum: Secondary | ICD-10-CM | POA: Diagnosis not present

## 2023-12-09 LAB — RAD ONC ARIA SESSION SUMMARY
Course Elapsed Days: 13
Plan Fractions Treated to Date: 10
Plan Prescribed Dose Per Fraction: 2.5 Gy
Plan Total Fractions Prescribed: 12
Plan Total Prescribed Dose: 30 Gy
Reference Point Dosage Given to Date: 25 Gy
Reference Point Session Dosage Given: 2.5 Gy
Session Number: 10

## 2023-12-10 ENCOUNTER — Ambulatory Visit
Admission: RE | Admit: 2023-12-10 | Discharge: 2023-12-10 | Disposition: A | Discharge status: Home / Self Care | Source: Ambulatory Visit | Attending: Radiation Oncology | Admitting: Radiation Oncology

## 2023-12-10 ENCOUNTER — Inpatient Hospital Stay

## 2023-12-10 ENCOUNTER — Encounter: Payer: Self-pay | Admitting: Hematology and Oncology

## 2023-12-10 ENCOUNTER — Inpatient Hospital Stay: Admitting: Hematology and Oncology

## 2023-12-10 ENCOUNTER — Other Ambulatory Visit: Payer: Self-pay | Admitting: Hematology and Oncology

## 2023-12-10 ENCOUNTER — Other Ambulatory Visit: Payer: Self-pay

## 2023-12-10 VITALS — BP 130/72 | HR 78 | Temp 98.5°F | Resp 18 | Ht 69.0 in | Wt 187.0 lb

## 2023-12-10 DIAGNOSIS — C20 Malignant neoplasm of rectum: Secondary | ICD-10-CM

## 2023-12-10 DIAGNOSIS — Z510 Encounter for antineoplastic radiation therapy: Secondary | ICD-10-CM | POA: Diagnosis not present

## 2023-12-10 LAB — RAD ONC ARIA SESSION SUMMARY
Course Elapsed Days: 14
Plan Fractions Treated to Date: 11
Plan Prescribed Dose Per Fraction: 2.5 Gy
Plan Total Fractions Prescribed: 12
Plan Total Prescribed Dose: 30 Gy
Reference Point Dosage Given to Date: 27.5 Gy
Reference Point Session Dosage Given: 2.5 Gy
Session Number: 11

## 2023-12-10 LAB — CMP (CANCER CENTER ONLY)
ALT: 9 U/L (ref 0–44)
AST: 18 U/L (ref 15–41)
Albumin: 3.9 g/dL (ref 3.5–5.0)
Alkaline Phosphatase: 65 U/L (ref 38–126)
Anion gap: 10 (ref 5–15)
BUN: 13 mg/dL (ref 8–23)
CO2: 24 mmol/L (ref 22–32)
Calcium: 9.5 mg/dL (ref 8.9–10.3)
Chloride: 103 mmol/L (ref 98–111)
Creatinine: 0.68 mg/dL (ref 0.61–1.24)
GFR, Estimated: >60 mL/min (ref >60)
Glucose, Bld: 92 mg/dL (ref 70–99)
Potassium: 4.4 mmol/L (ref 3.5–5.1)
Sodium: 137 mmol/L (ref 135–145)
Total Bilirubin: 0.4 mg/dL (ref 0.0–1.2)
Total Protein: 6.8 g/dL (ref 6.5–8.1)

## 2023-12-10 LAB — CBC WITH DIFFERENTIAL (CANCER CENTER ONLY)
Abs Immature Granulocytes: 0.04 10*3/uL (ref 0.00–0.07)
Basophils Absolute: 0 10*3/uL (ref 0.0–0.1)
Basophils Relative: 1 %
Eosinophils Absolute: 0.4 10*3/uL (ref 0.0–0.5)
Eosinophils Relative: 5 %
HCT: 31.2 % — ABNORMAL LOW (ref 39.0–52.0)
Hemoglobin: 9.2 g/dL — ABNORMAL LOW (ref 13.0–17.0)
Immature Granulocytes: 1 %
Lymphocytes Relative: 15 %
Lymphs Abs: 1.1 10*3/uL (ref 0.7–4.0)
MCH: 24.2 pg — ABNORMAL LOW (ref 26.0–34.0)
MCHC: 29.5 g/dL — ABNORMAL LOW (ref 30.0–36.0)
MCV: 82.1 fL (ref 80.0–100.0)
Monocytes Absolute: 0.6 10*3/uL (ref 0.1–1.0)
Monocytes Relative: 8 %
Neutro Abs: 4.9 10*3/uL (ref 1.7–7.7)
Neutrophils Relative %: 70 %
Platelet Count: 178 10*3/uL (ref 150–400)
RBC: 3.8 MIL/uL — ABNORMAL LOW (ref 4.22–5.81)
RDW: 15.1 % (ref 11.5–15.5)
WBC Count: 7 10*3/uL (ref 4.0–10.5)
nRBC: 0 % (ref 0.0–0.2)
nRBC: 0 /100{WBCs}

## 2023-12-10 MED ORDER — PROCHLORPERAZINE MALEATE 10 MG PO TABS: 10.0000 mg | ORAL_TABLET | Freq: Four times a day (QID) | ORAL | 3 refills | Status: AC | PRN

## 2023-12-10 MED ORDER — ONDANSETRON HCL 8 MG PO TABS: 8.0000 mg | ORAL_TABLET | Freq: Three times a day (TID) | ORAL | 0 refills | Status: DC | PRN

## 2023-12-10 NOTE — Progress Notes (Signed)
 CHEMO CARE CLINIC CONSULT NOTE   Patient Care Team: Cox, Burleigh Carp, MD as PCP - General (Family Medicine) Roxane Copp, MD as Consulting Physician (Urology) Enriqueta Harvey, RN as Case Manager (General Practice) Deloria Fetch, MD as Consulting Physician (Oncology) Verlie Glisson, MD as Consulting Physician (Radiation Oncology)   Name of the patient: Joshua Nelson  [id number]  05/21/1951   Date of visit: 12/10/23  Diagnosis- Rectal Cancer Stage IVA (cT3, cN2, cM1a)  Chief complaint/Reason for visit- Initial Meeting for Chemo Care Clinic, preparing for starting chemotherapy   Other persons participating in the visit and their role in the encounter: Sister    Heme/Onc history:  Oncology History  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)  10/30/2023 Initial Diagnosis   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)   12/16/2023 -  Chemotherapy   Patient is on Treatment Plan : COLORECTAL FOLFOX + Bevacizumab q14d       Interval history-  Patient presents to chemo care clinic today for initial meeting in preparation for starting chemotherapy. I introduced the chemo care clinic and we discussed that the role of the clinic is to assist those who are at an increased risk of emergency room visits and/or complications during the course of chemotherapy treatment. We discussed that the increased risk takes into account factors such as age, performance status, and co-morbidities. We also discussed that for some, this might include barriers to care such as not having a primary care provider, lack of insurance/transportation, or not being able to afford medications. We discussed that the goal of the program is to help prevent unplanned ER visits and help reduce complications during chemotherapy. We do this by discussing specific risk factors to each individual and identifying ways that we can help improve these risk factors and reduce barriers to care.   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   PONV (postoperative nausea and vomiting)    Prostate cancer (HCC) 07/2021    Past Surgical History:  Procedure Laterality Date   CARDIAC CATHETERIZATION     THREE CARDIAC STENTS PLACED   COLONOSCOPY W/ POLYPECTOMY     CYSTOSCOPY WITH URETHRAL DILATATION N/A 08/01/2021   Procedure: CYSTOSCOPY WITH BALLOON URETHRAL DILATATION;  Surgeon: Roxane Copp, MD;  Location: [facility];  Service: Urology;  Laterality: N/A;   left cataract  10/2019   NOSE SURGERY     RESULT OF DOG BITE DURING CHILDHOOD   PROSTATE BIOPSY     TRANSURETHRAL RESECTION OF BLADDER TUMOR N/A 08/01/2021   Procedure: TRANSURETHRAL RESECTION OF PROSTATE;  Surgeon: Roxane Copp, MD;  Location: [facility];  Service: Urology;  Laterality: N/A;  90 MINS   URETHRAL STRICTURE DILATATION      Social History   Socioeconomic History   Marital status: Married    Spouse name: Thersia Flax   Number of children: 1   Years of education: 12   Highest education level: High school graduate  Occupational History   Occupation: RETIRED - TRUCK DRIVER  Tobacco Use   Smoking status: Former    Current packs/day: 0.00    Types: Cigarettes  Quit date: 2003    Years since quitting: 22.3   Smokeless tobacco: Never  Vaping Use   Vaping status: Never Used  Substance and Sexual Activity   Alcohol use: Not Currently    Comment: none since 2000   Drug use: Never   Sexual activity: Not Currently  Other Topics Concern   Not on file  Social History Narrative   Lives at home with his wife.   2 cups caffeine per day.   Right-handed.   Social Drivers of Manufacturing engineer Strain: Low Risk  (03/21/2023)   Overall Financial Resource Strain (CARDIA)    Difficulty of Paying Living Expenses: Not hard at all  Food Insecurity: No Food Insecurity (10/28/2023)   Hunger Vital Sign    Worried About Running Out of Food in the Last Year: Never true    Ran Out of Food in the Last Year: Never true  Transportation Needs: No Transportation Needs (10/28/2023)   PRAPARE - Administrator, Civil Service (Medical): No    Lack of Transportation (Non-Medical): No  Physical Activity: Insufficiently Active (03/21/2023)   Exercise Vital Sign    Days of Exercise per Week: 7 days    Minutes of Exercise per Session: 20 min  Stress: No Stress Concern Present (03/21/2023)   [employer] of Occupational Health - Occupational Stress Questionnaire    Feeling of Stress : Not at all  Social Connections: Socially Integrated (03/21/2023)   Social Connection and Isolation Panel [NHANES]    Frequency of Communication with Friends and Family: More than three times a week    Frequency of Social Gatherings with Friends and Family: More than three times a week    Attends Religious Services: More than 4 times per year    Active Member of [employer] or Organizations: Yes    Attends Engineer, structural: More than 4 times per year    Marital Status: Married  Catering manager Violence: Not At Risk (10/28/2023)   Humiliation, Afraid, Rape, and Kick questionnaire    Fear of Current or Ex-Partner: No    Emotionally Abused: No    Physically Abused: No    Sexually Abused: No    Family History  Problem Relation Age of Onset   Endometrial cancer Mother    CAD Mother    AAA (abdominal aortic aneurysm) Mother    Dementia Father    Anxiety disorder Father    Depression Father    Heart disease Father    Hypertension Father    Deep vein thrombosis Father    Kidney Stones Sister        H/O : INTESTINAL BLOCKAGE   Barrett's esophagus Sister    Hyperlipidemia Sister     Kidney Stones Brother    Heart attack Brother    Colon cancer Neg Hx    Esophageal cancer Neg Hx    Liver cancer Neg Hx    Stomach cancer Neg Hx      Current Outpatient Medications:    aspirin EC 81 MG tablet, Take 81 mg by mouth daily. Swallow whole., Disp: , Rfl:    Continuous Glucose Sensor (FREESTYLE LIBRE 2 SENSOR) MISC, Change sensor every 10 days as directed,, Disp: 2 each, Rfl: 5   ezetimibe  (ZETIA ) 10 MG tablet, TAKE ONE (1) TABLET ONCE DAILY, Disp: 90 tablet, Rfl: 1   FARXIGA  10 MG TABS tablet, TAKE 1 TABLET BY MOUTH ONCE DAILY IN THE MORNING AS DIRECTED., Disp: 90 tablet, Rfl:  1   gabapentin  (NEURONTIN ) 300 MG capsule, Take 1 capsule (300 mg total) by mouth 3 (three) times daily., Disp: 270 capsule, Rfl: 1   insulin  aspart (FIASP  FLEXTOUCH) 100 UNIT/ML FlexTouch Pen, Inject 16 Units into the skin 2 (two) times daily. With breakfast and supper, Disp: 30 mL, Rfl: 1   LANTUS  SOLOSTAR 100 UNIT/ML Solostar Pen, Inject 65 Units into the skin daily. Pt takes in the pm per wife, Disp: 30 mL, Rfl: 1   lisinopril  (ZESTRIL ) 5 MG tablet, Once daily in the am, Disp: 90 tablet, Rfl: 0   Meclizine  HCl 25 MG CHEW, TAKE 1 TABLET THREE TIMES DAILY AS NEEDED FOR DIZZINESS, Disp: 30 tablet, Rfl: 0   metoprolol  succinate (TOPROL -XL) 25 MG 24 hr tablet, TAKE 1 TABLET BY MOUTH ONCE DAILY IN THE MORNING., Disp: 90 tablet, Rfl: 0   rosuvastatin  (CRESTOR ) 40 MG tablet, Take 1 tablet by mouth at bedtime., Disp: 90 tablet, Rfl: 0   Semaglutide , 2 MG/DOSE, (OZEMPIC , 2 MG/DOSE,) 8 MG/3ML SOPN, Inject 2 mg into the skin once a week., Disp: 9 mL, Rfl: 0   traMADol  (ULTRAM ) 50 MG tablet, 1-2 tabs po q6h prn pain, Disp: 50 tablet, Rfl: 0     Latest Ref Rng & Units 12/05/2023   11:14 AM  CMP  Glucose 70 - 99 mg/dL 161   BUN 8 - 23 mg/dL 13   Creatinine 0.96 - 1.24 mg/dL 0.45   Sodium 409 - 811 mmol/L 139   Potassium 3.5 - 5.1 mmol/L 4.3   Chloride 98 - 111 mmol/L 104   CO2 22 - 32 mmol/L 26   Calcium   8.9 - 10.3 mg/dL 9.4   Total Protein 6.5 - 8.1 g/dL 6.7   Total Bilirubin 0.0 - 1.2 mg/dL 0.3   Alkaline Phos 38 - 126 U/L 65   AST 15 - 41 U/L 18   ALT 0 - 44 U/L 10       Latest Ref Rng & Units 12/05/2023   11:14 AM  CBC  WBC 4.0 - 10.5 K/uL 6.8   Hemoglobin 13.0 - 17.0 g/dL 9.2   Hematocrit 91.4 - 52.0 % 30.4   Platelets 150 - 400 K/uL 166     No images are attached to the encounter.  No results found.   Assessment and plan- Patient is a 73 y.o. male who presents to Chemo Care Clinic for initial meeting in preparation for starting chemotherapy for the treatment of Rectal cancer.   Chemo Care Clinic/High Risk for ER/Hospitalization during chemotherapy- We discussed the role of the chemo care clinic and identified patient specific risk factors. 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   PONV (postoperative nausea and vomiting)    Prostate cancer (HCC) 07/2021    Patient has past surgical history positive for: Past Surgical History:  Procedure Laterality Date   CARDIAC CATHETERIZATION     THREE CARDIAC STENTS PLACED   COLONOSCOPY W/ POLYPECTOMY     CYSTOSCOPY WITH URETHRAL DILATATION N/A 08/01/2021   Procedure:  CYSTOSCOPY WITH BALLOON URETHRAL DILATATION;  Surgeon: Roxane Copp, MD;  [facility];  Service: Urology;  Laterality: N/A;   left cataract  10/2019   NOSE SURGERY     RESULT OF DOG BITE DURING CHILDHOOD   PROSTATE BIOPSY     TRANSURETHRAL RESECTION OF BLADDER TUMOR N/A 08/01/2021   Procedure:  TRANSURETHRAL RESECTION OF PROSTATE;  Surgeon: Roxane Copp, MD;  Location[facility];  Service: Urology;  Laterality: N/A;  90 MINS   URETHRAL STRICTURE DILATATION       We discussed that social determinants of health may have significant impacts on health and outcomes for cancer patients.  Today we discussed specific social determinants of performance status, alcohol use, depression, financial needs, food insecurity, housing, interpersonal violence, social connections, stress, tobacco use, and transportation.    After lengthy discussion the following were identified as areas of need:   Outpatient services: We discussed options including home based and outpatient services, DME and care program. We discusssed that patients who participate in regular physical activity report fewer negative impacts of cancer and treatments and report less fatigue.   Financial Concerns: We discussed that living with cancer can create tremendous financial burden.  We discussed options for assistance. I asked that if assistance is needed in affording medications or paying bills to please let us  know so that we can provide assistance. We discussed options for food including social services, [employer] ($50 every 2 weeks) and onsite food pantry.  We will also notify Marylou Sobers [place] to see if cancer center can provide additional support.  Referral to Social work: Introduced Child psychotherapist Helmut Lobe and the services he can provide such as support with utility bill, cell phone and gas vouchers.   Support groups: We discussed options for support groups at the cancer center. If interested, please notify nurse navigator to enroll. We discussed options for managing stress including healthy eating, exercise as well as participating in no charge counseling services at the cancer center and support groups.  If these are of interest, patient can notify either myself or primary nursing team.We discussed options for management  including medications and referral to quit Smart program  Transportation: We discussed options for transportation including [employer], paratransit, bus routes, link transit, taxi/uber/lyft, and cancer center van.  I have notified primary oncology team who will help assist with arranging Carloyn Chi transportation for appointments when/if needed. We also discussed options for transportation on short notice/acute visits.  Palliative care services: We have palliative care services available in the cancer center to discuss goals of care and advanced care planning.  Please let us  know if you have any questions or would like to speak to our palliative nurse practitioner.  Symptom Management Clinic: We discussed our symptom management clinic which is available for acute concerns while receiving treatment such as nausea, vomiting or diarrhea.  We can be reached via telephone at [phone] or through my chart.  We are available for virtual or in person visits on the same day from 830 to 4 PM Monday through Friday. He denies needing specific assistance at this time and He will be followed by Dr. Trina Fujita clinical team.  Plan: Discussed symptom management clinic. Discussed palliative care services. Discussed resources that are available here at the cancer center. Discussed medications and new prescriptions to begin treatment such as anti-nausea or steroids.  Refer to nutrition  Disposition: RTC on   Visit Diagnosis Rectal Cancer  Patient expressed understanding and was in agreement with  this plan. He also understands that He can call clinic at any time with any questions, concerns, or complaints.   I provided 60 minutes of  face to face  during this encounter, and > 50% was spent counseling as documented under my assessment & plan.   Baldomero Bone, FNP- [facility]

## 2023-12-10 NOTE — Addendum Note (Signed)
 Addended by: Baldomero Bone on: 12/10/2023 01:21 PM   Modules accepted: Orders

## 2023-12-11 ENCOUNTER — Ambulatory Visit
Admission: RE | Admit: 2023-12-11 | Discharge: 2023-12-11 | Disposition: A | Discharge status: Home / Self Care | Source: Ambulatory Visit | Attending: Radiation Oncology | Admitting: Radiation Oncology

## 2023-12-11 ENCOUNTER — Other Ambulatory Visit: Payer: Self-pay

## 2023-12-11 DIAGNOSIS — Z510 Encounter for antineoplastic radiation therapy: Secondary | ICD-10-CM | POA: Diagnosis not present

## 2023-12-11 LAB — RAD ONC ARIA SESSION SUMMARY
Course Elapsed Days: 15
Plan Fractions Treated to Date: 12
Plan Prescribed Dose Per Fraction: 2.5 Gy
Plan Total Fractions Prescribed: 12
Plan Total Prescribed Dose: 30 Gy
Reference Point Dosage Given to Date: 30 Gy
Reference Point Session Dosage Given: 2.5 Gy
Session Number: 12

## 2023-12-12 NOTE — Radiation Completion Notes (Signed)
 Patient Name: Joshua Nelson, Joshua Nelson MRN: [id number] Date of Birth: 02-18-1951 Referring Physician: Mercy Stall, M.D. Date of Service: 2023-12-12 Radiation Oncologist: Verlie Glisson, M.D. [facility]                             RADIATION ONCOLOGY END OF TREATMENT NOTE     Diagnosis: C20 Malignant neoplasm of rectum Staging on 2023-11-14: 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) T=cT3, N=cN2, M=cM1a Intent: Palliative     ==========DELIVERED PLANS==========  First Treatment Date: 2023-11-26 Last Treatment Date: 2023-12-11   Plan Name: Pelvis_Rectum Site: Rectum Technique: 3D Mode: Photon Dose Per Fraction: 2.5 Gy Prescribed Dose (Delivered / Prescribed): 30 Gy / 30 Gy Prescribed Fxs (Delivered / Prescribed): 12 / 12     ==========ON TREATMENT VISIT DATES========== 2023-11-26, 2023-12-03, 2023-12-10     ==========UPCOMING VISITS========== 01/14/2024 [facility] OFFICE VISIT Mercy Stall, MD  01/13/2024 [facility] VISIT [facility] SCHEDULE  01/02/2024 [facility] PUMP START STOP [facility] INFUSION CHAIR 1  12/31/2023 [facility] INFUSION 4HR15MIN (255) [facility] INFUSION CHAIR 2  12/31/2023 [facility]  12/31/2023 [facility] ON[facility] EST PT 15 Lewis, Dequincy A, MD  12/19/2023 [facility] ONC PUMP START STOP [facility] INFUSION CHAIR 1  12/17/2023 [facility] ONC INFUSION 4HR15MIN (255) [facility] INFUSION CHAIR 2  12/16/2023 [facility] FT CARE [place], Jake L, DPM        ==========APPENDIX - ON TREATMENT VISIT NOTES==========   See weekly On Treatment Notes in Epic for details in the Media tab (listed as Progress notes on the On Treatment Visit Dates listed above).

## 2023-12-16 ENCOUNTER — Encounter: Payer: Self-pay | Admitting: Podiatry

## 2023-12-16 ENCOUNTER — Ambulatory Visit (INDEPENDENT_AMBULATORY_CARE_PROVIDER_SITE_OTHER): Admitting: Podiatry

## 2023-12-16 ENCOUNTER — Other Ambulatory Visit: Payer: Self-pay

## 2023-12-16 DIAGNOSIS — E1142 Type 2 diabetes mellitus with diabetic polyneuropathy: Secondary | ICD-10-CM

## 2023-12-16 DIAGNOSIS — M79675 Pain in left toe(s): Secondary | ICD-10-CM | POA: Diagnosis not present

## 2023-12-16 DIAGNOSIS — M79674 Pain in right toe(s): Secondary | ICD-10-CM | POA: Diagnosis not present

## 2023-12-16 DIAGNOSIS — B351 Tinea unguium: Secondary | ICD-10-CM | POA: Diagnosis not present

## 2023-12-16 NOTE — Progress Notes (Signed)
  Subjective:  Patient ID: Joshua Nelson, male    DOB: Sep 13, 1950,  MRN: [id number]  Chief Complaint  Patient presents with   [facility]    [facility] with out callous.  Last A1c was 5.9 in Feb takes ASA 24    73 y.o. male presents with the above complaint. History confirmed with patient. Patient presenting with pain related to dystrophic thickened elongated nails. Patient is unable to trim own nails related to nail dystrophy and/or mobility issues. Patient does have a history of T2DM with peripheral neuropathy. Unfortunately has new diagnosis of metastatic liver cancer  Objective:  Physical Exam: warm, good capillary refill,  nail exam onychomycosis of the toenails, onycholysis, and dystrophic nails DP and PT pulses 1/4 bilateral, and protective sensation absent Left Foot:  Pain with palpation of nails due to elongation and dystrophic growth.  Right Foot: Pain with palpation of nails due to elongation and dystrophic growth.   Assessment:   1. Pain due to onychomycosis of toenails of both feet   2. DM type 2 with diabetic peripheral neuropathy (HCC)     Plan:  Patient was evaluated and treated and all questions answered.  #Onychomycosis with pain  -Nails palliatively debrided as below. -Educated on self-care  Procedure: Nail Debridement Rationale: Pain Type of Debridement: manual, sharp debridement. Instrumentation: Nail nipper, rotary burr. Number of Nails: 10  Patient educated on diabetes. Discussed proper diabetic foot care and discussed risks and complications of disease. Educated patient in depth on reasons to return to the office immediately should he/she discover anything concerning or new on the feet. All questions answered. Discussed proper shoes as well.    Return in about 3 months (around 03/17/2024) for Diabetic Foot Care.         Eve Hinders, DPM [facility] / [facility]

## 2023-12-17 ENCOUNTER — Telehealth: Payer: Self-pay

## 2023-12-17 ENCOUNTER — Inpatient Hospital Stay: Attending: Oncology

## 2023-12-17 VITALS — BP 132/77 | HR 77 | Temp 98.0°F | Resp 18 | Ht 69.0 in | Wt 188.0 lb

## 2023-12-17 DIAGNOSIS — Z5111 Encounter for antineoplastic chemotherapy: Secondary | ICD-10-CM | POA: Insufficient documentation

## 2023-12-17 DIAGNOSIS — C20 Malignant neoplasm of rectum: Secondary | ICD-10-CM

## 2023-12-17 DIAGNOSIS — Z79899 Other long term (current) drug therapy: Secondary | ICD-10-CM | POA: Diagnosis not present

## 2023-12-17 LAB — TOTAL PROTEIN, URINE DIPSTICK: Protein, ur: NEGATIVE mg/dL

## 2023-12-17 MED ORDER — OXALIPLATIN CHEMO INJECTION 100 MG/20ML
85.0000 mg/m2 | Freq: Once | INTRAVENOUS | Status: AC
  Administered 2023-12-17: 175 mg via INTRAVENOUS
  Filled 2023-12-17: qty 35

## 2023-12-17 MED ORDER — DEXTROSE 5 % IV SOLN: INTRAVENOUS | Status: DC

## 2023-12-17 MED ORDER — FLUOROURACIL CHEMO INJECTION 2.5 GM/50ML
400.0000 mg/m2 | Freq: Once | INTRAVENOUS | Status: AC
  Administered 2023-12-17: 800 mg via INTRAVENOUS
  Filled 2023-12-17: qty 16

## 2023-12-17 MED ORDER — LEUCOVORIN CALCIUM INJECTION 350 MG
400.0000 mg/m2 | Freq: Once | INTRAVENOUS | Status: AC
  Administered 2023-12-17: 816 mg via INTRAVENOUS
  Filled 2023-12-17: qty 40.8

## 2023-12-17 MED ORDER — PALONOSETRON HCL INJECTION 0.25 MG/5ML
0.2500 mg | Freq: Once | INTRAVENOUS | Status: AC
  Administered 2023-12-17: 0.25 mg via INTRAVENOUS
  Filled 2023-12-17: qty 5

## 2023-12-17 MED ORDER — SODIUM CHLORIDE 0.9 % IV SOLN
2400.0000 mg/m2 | INTRAVENOUS | Status: DC
  Administered 2023-12-17: 5000 mg via INTRAVENOUS
  Filled 2023-12-17: qty 100

## 2023-12-17 MED ORDER — SODIUM CHLORIDE 0.9 % IV SOLN
5.0000 mg/kg | Freq: Once | INTRAVENOUS | Status: AC
  Administered 2023-12-17: 400 mg via INTRAVENOUS
  Filled 2023-12-17: qty 16

## 2023-12-17 MED ORDER — SODIUM CHLORIDE 0.9% FLUSH: 10.0000 mL | INTRAVENOUS | Status: DC | PRN

## 2023-12-17 MED ORDER — DEXAMETHASONE SODIUM PHOSPHATE 10 MG/ML IJ SOLN
10.0000 mg | Freq: Once | INTRAMUSCULAR | Status: AC
  Administered 2023-12-17: 10 mg via INTRAVENOUS
  Filled 2023-12-17: qty 1

## 2023-12-17 MED ORDER — HEPARIN SOD (PORK) LOCK FLUSH 100 UNIT/ML IV SOLN
500.0000 [IU] | Freq: Once | INTRAVENOUS | Status: DC | PRN
Start: 2023-12-17 — End: 2023-12-17

## 2023-12-17 MED ORDER — SODIUM CHLORIDE 0.9 % IV SOLN: INTRAVENOUS | Status: DC

## 2023-12-17 NOTE — Telephone Encounter (Signed)
 Notified patient of patient assist ready to pick up.

## 2023-12-17 NOTE — Patient Instructions (Signed)
 Leucovorin Injection What is this medication? LEUCOVORIN (loo koe VOR in) prevents side effects from certain medications, such as methotrexate. It works by increasing folate levels. This helps protect healthy cells in your body. It may also be used to treat anemia caused by low levels of folate. It can also be used with fluorouracil, a type of chemotherapy, to treat colorectal cancer. It works by increasing the effects of fluorouracil in the body. This medicine may be used for other purposes; ask your health care provider or pharmacist if you have questions. What should I tell my care team before I take this medication? They need to know if you have any of these conditions: Anemia from low levels of vitamin B12 in the blood An unusual or allergic reaction to leucovorin, folic acid , other medications, foods, dyes, or preservatives Pregnant or trying to get pregnant Breastfeeding How should I use this medication? This medication is injected into a vein or a muscle. It is given by your care team in a hospital or clinic setting. Talk to your care team about the use of this medication in children. Special care may be needed. Overdosage: If you think you have taken too much of this medicine contact a poison control center or emergency room at once. NOTE: This medicine is only for you. Do not share this medicine with others. What if I miss a dose? Keep appointments for follow-up doses. It is important not to miss your dose. Call your care team if you are unable to keep an appointment. What may interact with this medication? Capecitabine Fluorouracil Phenobarbital Phenytoin Primidone Trimethoprim;sulfamethoxazole This list may not describe all possible interactions. Give your health care provider a list of all the medicines, herbs, non-prescription drugs, or dietary supplements you use. Also tell them if you smoke, drink alcohol, or use illegal drugs. Some items may interact with your medicine. What  should I watch for while using this medication? Your condition will be monitored carefully while you are receiving this medication. This medication may increase the side effects of 5-fluorouracil. Tell your care team if you have diarrhea or mouth sores that do not get better or that get worse. What side effects may I notice from receiving this medication? Side effects that you should report to your care team as soon as possible: Allergic reactions--skin rash, itching, hives, swelling of the face, lips, tongue, or throat This list may not describe all possible side effects. Call your doctor for medical advice about side effects. You may report side effects to FDA at 1-800-FDA-1088. Where should I keep my medication? This medication is given in a hospital or clinic. It will not be stored at home. NOTE: This sheet is a summary. It may not cover all possible information. If you have questions about this medicine, talk to your doctor, pharmacist, or health care provider.  2024 Elsevier/Gold Standard (2022-01-02 00:00:00)Bevacizumab Injection What is this medication? BEVACIZUMAB (be va SIZ yoo mab) treats some types of cancer. It works by blocking a protein that causes cancer cells to grow and multiply. This helps to slow or stop the spread of cancer cells. It is a monoclonal antibody. This medicine may be used for other purposes; ask your health care provider or pharmacist if you have questions. COMMON BRAND NAME(S): Alymsys, Avastin, MVASI, Vegzalma, Zirabev What should I tell my care team before I take this medication? They need to know if you have any of these conditions: Blood clots Coughing up blood Having or recent surgery Heart failure High  blood pressure History of a connection between 2 or more body parts that do not usually connect (fistula) History of a tear in your stomach or intestines Protein in your urine An unusual or allergic reaction to bevacizumab, other medications, foods,  dyes, or preservatives Pregnant or trying to get pregnant Breast-feeding How should I use this medication? This medication is injected into a vein. It is given by your care team in a hospital or clinic setting. Talk to your care team the use of this medication in children. Special care may be needed. Overdosage: If you think you have taken too much of this medicine contact a poison control center or emergency room at once. NOTE: This medicine is only for you. Do not share this medicine with others. What if I miss a dose? Keep appointments for follow-up doses. It is important not to miss your dose. Call your care team if you are unable to keep an appointment. What may interact with this medication? Interactions are not expected. This list may not describe all possible interactions. Give your health care provider a list of all the medicines, herbs, non-prescription drugs, or dietary supplements you use. Also tell them if you smoke, drink alcohol, or use illegal drugs. Some items may interact with your medicine. What should I watch for while using this medication? Your condition will be monitored carefully while you are receiving this medication. You may need blood work while taking this medication. This medication may make you feel generally unwell. This is not uncommon as chemotherapy can affect healthy cells as well as cancer cells. Report any side effects. Continue your course of treatment even though you feel ill unless your care team tells you to stop. This medication may increase your risk to bruise or bleed. Call your care team if you notice any unusual bleeding. Before having surgery, talk to your care team to make sure it is ok. This medication can increase the risk of poor healing of your surgical site or wound. You will need to stop this medication for 28 days before surgery. After surgery, wait at least 28 days before restarting this medication. Make sure the surgical site or wound is  healed enough before restarting this medication. Talk to your care team if questions. Talk to your care team if you may be pregnant. Serious birth defects can occur if you take this medication during pregnancy and for 6 months after the last dose. Contraception is recommended while taking this medication and for 6 months after the last dose. Your care team can help you find the option that works for you. Do not breastfeed while taking this medication and for 6 months after the last dose. This medication can cause infertility. Talk to your care team if you are concerned about your fertility. What side effects may I notice from receiving this medication? Side effects that you should report to your care team as soon as possible: Allergic reactions--skin rash, itching, hives, swelling of the face, lips, tongue, or throat Bleeding--bloody or black, tar-like stools, vomiting blood or brown material that looks like coffee grounds, red or dark brown urine, small red or purple spots on skin, unusual bruising or bleeding Blood clot--pain, swelling, or warmth in the leg, shortness of breath, chest pain Heart attack--pain or tightness in the chest, shoulders, arms, or jaw, nausea, shortness of breath, cold or clammy skin, feeling faint or lightheaded Heart failure--shortness of breath, swelling of the ankles, feet, or hands, sudden weight gain, unusual weakness or fatigue Increase  in blood pressure Infection--fever, chills, cough, sore throat, wounds that don't heal, pain or trouble when passing urine, general feeling of discomfort or being unwell Infusion reactions--chest pain, shortness of breath or trouble breathing, feeling faint or lightheaded Kidney injury--decrease in the amount of urine, swelling of the ankles, hands, or feet Stomach pain that is severe, does not go away, or gets worse Stroke--sudden numbness or weakness of the face, arm, or leg, trouble speaking, confusion, trouble walking, loss of  balance or coordination, dizziness, severe headache, change in vision Sudden and severe headache, confusion, change in vision, seizures, which may be signs of posterior reversible encephalopathy syndrome (PRES) Side effects that usually do not require medical attention (report to your care team if they continue or are bothersome): Back pain Change in taste Diarrhea Dry skin Increased tears Nosebleed This list may not describe all possible side effects. Call your doctor for medical advice about side effects. You may report side effects to FDA at 1-800-FDA-1088. Where should I keep my medication? This medication is given in a hospital or clinic. It will not be stored at home. NOTE: This sheet is a summary. It may not cover all possible information. If you have questions about this medicine, talk to your doctor, pharmacist, or health care provider.  2024 Elsevier/Gold Standard (2021-12-15 00:00:00)Oxaliplatin Injection What is this medication? OXALIPLATIN (ox AL i PLA tin) treats colorectal cancer. It works by slowing down the growth of cancer cells. This medicine may be used for other purposes; ask your health care provider or pharmacist if you have questions. COMMON BRAND NAME(S): Eloxatin What should I tell my care team before I take this medication? They need to know if you have any of these conditions: Heart disease History of irregular heartbeat or rhythm Liver disease Low blood cell levels (white cells, red cells, and platelets) Lung or breathing disease, such as asthma Take medications that treat or prevent blood clots Tingling of the fingers, toes, or other nerve disorder An unusual or allergic reaction to oxaliplatin, other medications, foods, dyes, or preservatives If you or your partner are pregnant or trying to get pregnant Breast-feeding How should I use this medication? This medication is injected into a vein. It is given by your care team in a hospital or clinic  setting. Talk to your care team about the use of this medication in children. Special care may be needed. Overdosage: If you think you have taken too much of this medicine contact a poison control center or emergency room at once. NOTE: This medicine is only for you. Do not share this medicine with others. What if I miss a dose? Keep appointments for follow-up doses. It is important not to miss a dose. Call your care team if you are unable to keep an appointment. What may interact with this medication? Do not take this medication with any of the following: Cisapride Dronedarone Pimozide Thioridazine This medication may also interact with the following: Aspirin and aspirin-like medications Certain medications that treat or prevent blood clots, such as warfarin, apixaban, dabigatran, and rivaroxaban Cisplatin Cyclosporine Diuretics Medications for infection, such as acyclovir, adefovir, amphotericin B, bacitracin , cidofovir, foscarnet, ganciclovir, gentamicin, pentamidine, vancomycin NSAIDs, medications for pain and inflammation, such as ibuprofen  or naproxen Other medications that cause heart rhythm changes Pamidronate Zoledronic acid This list may not describe all possible interactions. Give your health care provider a list of all the medicines, herbs, non-prescription drugs, or dietary supplements you use. Also tell them if you smoke, drink alcohol, or  use illegal drugs. Some items may interact with your medicine. What should I watch for while using this medication? Your condition will be monitored carefully while you are receiving this medication. You may need blood work while taking this medication. This medication may make you feel generally unwell. This is not uncommon as chemotherapy can affect healthy cells as well as cancer cells. Report any side effects. Continue your course of treatment even though you feel ill unless your care team tells you to stop. This medication may  increase your risk of getting an infection. Call your care team for advice if you get a fever, chills, sore throat, or other symptoms of a cold or flu. Do not treat yourself. Try to avoid being around people who are sick. Avoid taking medications that contain aspirin, acetaminophen , ibuprofen , naproxen, or ketoprofen unless instructed by your care team. These medications may hide a fever. Be careful brushing or flossing your teeth or using a toothpick because you may get an infection or bleed more easily. If you have any dental work done, tell your dentist you are receiving this medication. This medication can make you more sensitive to cold. Do not drink cold drinks or use ice. Cover exposed skin before coming in contact with cold temperatures or cold objects. When out in cold weather wear warm clothing and cover your mouth and nose to warm the air that goes into your lungs. Tell your care team if you get sensitive to the cold. Talk to your care team if you or your partner are pregnant or think either of you might be pregnant. This medication can cause serious birth defects if taken during pregnancy and for 9 months after the last dose. A negative pregnancy test is required before starting this medication. A reliable form of contraception is recommended while taking this medication and for 9 months after the last dose. Talk to your care team about effective forms of contraception. Do not father a child while taking this medication and for 6 months after the last dose. Use a condom while having sex during this time period. Do not breastfeed while taking this medication and for 3 months after the last dose. This medication may cause infertility. Talk to your care team if you are concerned about your fertility. What side effects may I notice from receiving this medication? Side effects that you should report to your care team as soon as possible: Allergic reactions--skin rash, itching, hives, swelling of the  face, lips, tongue, or throat Bleeding--bloody or black, tar-like stools, vomiting blood or brown material that looks like coffee grounds, red or dark brown urine, small red or purple spots on skin, unusual bruising or bleeding Dry cough, shortness of breath or trouble breathing Heart rhythm changes--fast or irregular heartbeat, dizziness, feeling faint or lightheaded, chest pain, trouble breathing Infection--fever, chills, cough, sore throat, wounds that don't heal, pain or trouble when passing urine, general feeling of discomfort or being unwell Liver injury--right upper belly pain, loss of appetite, nausea, light-colored stool, dark yellow or brown urine, yellowing skin or eyes, unusual weakness or fatigue Low red blood cell level--unusual weakness or fatigue, dizziness, headache, trouble breathing Muscle injury--unusual weakness or fatigue, muscle pain, dark yellow or brown urine, decrease in amount of urine Pain, tingling, or numbness in the hands or feet Sudden and severe headache, confusion, change in vision, seizures, which may be signs of posterior reversible encephalopathy syndrome (PRES) Unusual bruising or bleeding Side effects that usually do not require medical attention (report  to your care team if they continue or are bothersome): Diarrhea Nausea Pain, redness, or swelling with sores inside the mouth or throat Unusual weakness or fatigue Vomiting This list may not describe all possible side effects. Call your doctor for medical advice about side effects. You may report side effects to FDA at 1-800-FDA-1088. Where should I keep my medication? This medication is given in a hospital or clinic. It will not be stored at home. NOTE: This sheet is a summary. It may not cover all possible information. If you have questions about this medicine, talk to your doctor, pharmacist, or health care provider.  2024 Elsevier/Gold Standard (2023-07-12 00:00:00)

## 2023-12-17 NOTE — Telephone Encounter (Signed)
 Notified patient of Ozempic  patient assist ready for pick up.

## 2023-12-18 ENCOUNTER — Encounter

## 2023-12-19 ENCOUNTER — Inpatient Hospital Stay

## 2023-12-19 ENCOUNTER — Telehealth: Payer: Self-pay

## 2023-12-19 VITALS — BP 152/70 | HR 76 | Temp 98.5°F | Resp 20

## 2023-12-19 DIAGNOSIS — C20 Malignant neoplasm of rectum: Secondary | ICD-10-CM

## 2023-12-19 DIAGNOSIS — Z5111 Encounter for antineoplastic chemotherapy: Secondary | ICD-10-CM | POA: Diagnosis not present

## 2023-12-19 MED ORDER — SODIUM CHLORIDE 0.9% FLUSH
10.0000 mL | INTRAVENOUS | Status: DC | PRN
  Administered 2023-12-19: 10 mL

## 2023-12-19 MED ORDER — HEPARIN SOD (PORK) LOCK FLUSH 100 UNIT/ML IV SOLN
500.0000 [IU] | Freq: Once | INTRAVENOUS | Status: AC | PRN
  Administered 2023-12-19: 500 [IU]

## 2023-12-19 NOTE — Telephone Encounter (Signed)
 I attempted call to pt to see how he is feeling since his first infusion of FOLFOX, Avastin on 12/17/23.

## 2023-12-19 NOTE — Patient Instructions (Signed)
 Central Line in Adults: What to Expect A central line is a soft tube called a catheter that is put into a vein in the neck, chest, arm or groin. The tip of the central line ends in a large vein just above the heart called the vena cava. A central line may be used to: Give medicines or fluids through an IV for a long time. Give you nutrients. Take blood or give you blood for testing or treatments. Give chemotherapy or dialysis. Provide IV access if veins in your hands or arms are difficult to use. Types of central lines There are four main types of central lines: Peripherally inserted central catheter (PICC) line. A PICC line goes up the upper arm to the vena cava. This is used for access of one week or longer. It can be used to draw blood, give fluids or medicines. Tunneled central line. It's placed in a large vein in the neck, chest, or groin. It's put in through a small cut is made over the vein, and then it's advanced to the vena cava. This is used for long-term therapy and dialysis. It's tunneled under the skin and brought out through a second cut. Non-tunneled central line. This type is put in the neck, chest or groin. It's usually used for 7 days at the most. It's often used in the emergency department. Implanted port. It's usually put in the upper chest, but it can also be placed in the upper arm or the belly. This is used for long-term therapy. It can stay in place longer than other types of central lines. It's put in and taken out with surgery, and it's accessed using a needle. The type of central line you get depends on how long you will need it, your medical condition, and the condition of your veins. Tell a health care provider about: Any allergies you have. All medicines you take. These include vitamins, herbs, eye drops, and creams. Any problems you or family members have had with anesthesia. Any bleeding problems you have. Any surgeries you've had. Any medical conditions  you have. Whether you're pregnant or may be pregnant. What are the risks? Your health care provider will talk with you about risks. These may include: Infection. A blood clot that blocks the central line or forms in the vein and travels to the heart. Bleeding. Getting a hole or crack in the central line. If this happens, the central line will need to be replaced. The catheter moving or coming out of place. A collapsed lung. Damage to other structures or organs. What happens before the procedure? Medicines Ask about changing or stopping: Any medicines you take. Any vitamins, herbs, or supplements you take. Do not take aspirin or ibuprofen unless you're told to. Surgery safety For your safety, you may: Need to wash your skin with a soap that kills germs. Get antibiotics. Have your procedure site marked. Have hair removed at the procedure site. General instructions Eat and drink as told. Ask if you'll be staying overnight in the hospital. If you'll be going home right after the procedure, plan to have a responsible adult: Drive you home from the hospital or clinic. You won't be allowed to drive. Stay with you for the time you're told. What happens during the procedure? An IV will be put into one of your veins. You may be given: A sedative to help you relax. Anesthesia to keep you from feeling pain. Your skin will be cleaned with a soap that kills germs, and  you may be covered with a clean sheet called a drape. Your blood pressure, heart rate, breathing rate, and blood oxygen level will be checked during the procedure. The central line catheter will be put into the vein and moved through to the correct spot. The provider may use X-rays to make sure the catheter goes to the right place. A bandage will be placed over the insertion area. These steps may vary. Ask what you can expect. What can I expect after the procedure? You will watched closely until you leave. This includes  checking your pain level, blood pressure, heart rate, and breathing rate. Caps may be put on the ends of the central line tubes. If you were given a sedative, do not drive or use machines until you're told it's safe. A sedative can make you sleepy. Follow these instructions at home: Flushing and cleaning the central line  Follow instructions from your provider about flushing and cleaning the central line and the area around it. Only use germ-free, also called sterile, supplies to flush the central line. Use supplies recommended by your provider. Before you flush the central line or clean the central line or the area around it: Wash your hands with soap and water for at least 20 seconds. If you can't use soap and water, use hand sanitizer. Clean the central line hub with rubbing alcohol. To do this: Clean the central line hub with a new alcohol wipe. Scrub it using a twisting motion and rub for 10 to 15 seconds or for 30 twists. Follow the manufacturer's instructions. Be sure you scrub the top of the hub, not just the sides. Let the hub dry before use. Prevent it from touching anything while drying. Do not re-use alcohol pads. Caring for your skin Check your central line site every day for signs of infection. Check for: Redness, swelling, or pain. Fluid or blood. Warmth. Pus or a bad smell. Keep the insertion site of your central line clean and dry at all times. Change your bandage only as told by your provider. Keep your bandage dry. If it gets wet, have it changed as soon as possible. Storage and throwing away supplies Keep your supplies in a clean, dry location. Throw away any used syringes in a disposal container that is meant for sharp items, called a sharps container. You can buy a sharps container from a pharmacy, or you can make one by using an empty hard plastic bottle with a cover. Place any used bandages or infusion bags into a plastic bag. Throw that bag in the trash. General  instructions Follow instructions from your provider for the type of device that you have. Keep the tube clamped unless it's being used. If you or someone else accidentally pulls on the tube, make sure: The bandage is OK. There is no bleeding. The tube has not been pulled out. Do not use scissors or sharp objects near the tube. Do not take baths, swim, or use a hot tub until you're told it's OK. Ask if you can shower. Ask what things are safe for you to do. Your provider may tell you not to lift anything or move your arm too much on the side of your central line. Contact a health care provider if: You have signs of an infection where the tube was put in. The skin is irritated near the bandage. You catheter appears to be getting longer. This may mean it's coming out of the vein. Get help right away if: You have:  A fever or chills. Shortness of breath. Pain in your chest. A fast heartbeat. Swelling in your neck, face, chest, or arm on the side of your central line. You feel dizzy or you faint. Your cut or central line site has red streaks spreading away from the area. Your cut or central line site is bleeding and won't stop. Your central line is hard to flush or will not flush or you do not get a blood return. Your central line gets loose, damaged or comes out. Your catheter leaks when flushed or when fluids are infused into it. This information is not intended to replace advice given to you by your health care provider. Make sure you discuss any questions you have with your health care provider. Document Revised: 02/25/2023 Document Reviewed: 02/25/2023 Elsevier Patient Education  2024 [employer].

## 2023-12-23 ENCOUNTER — Encounter: Payer: Self-pay | Admitting: Oncology

## 2023-12-23 NOTE — Telephone Encounter (Signed)
 I spoke with pt's wife, and I could hear Colwyn answering my questions in the background. She states, "He done really good, just tired. He hasn't been sick at all". He denies N/V, mouth sores, SOB, cough, chest pain, rash, fever and chills. He said he had some diarrhea the first day or 2 but took Imodium and has been fine since. I reminded them to call us  if he were to develop temp of 100.4 or higher, day or night. She verbalized understanding. We confirmed his next appt for 12/31/2023.

## 2023-12-24 ENCOUNTER — Other Ambulatory Visit: Payer: Self-pay | Admitting: Oncology

## 2023-12-24 DIAGNOSIS — C787 Secondary malignant neoplasm of liver and intrahepatic bile duct: Secondary | ICD-10-CM

## 2023-12-30 ENCOUNTER — Encounter: Payer: Self-pay | Admitting: Oncology

## 2023-12-30 NOTE — Progress Notes (Signed)
 [facility]  [address] [place],  [place]  [address] [phone]  Clinic Day:  12/31/2023  Referring physician: Mercy Stall, MD   HISTORY OF PRESENT ILLNESS:  The patient is a 73 y.o. 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  He comes in today to be evaluated before heading into his 2nd cycle of FOLFOX/Avastin .  The patient claims to have tolerated his 1st cycle of palliative chemotherapy very well.  He has not noticed any rectal bleeding over at least the past week.  His initial palliative radiation to his rectal lesion led to a substantial decline in his rectal bleeding, with his chemotherapy essentially eradicating any residual rectal bleeding he had left.  Clinically, the patient is doing well.  He denies having any symptoms which concern him for disease progression while on chemotherapy.  PHYSICAL EXAM:  Blood pressure (!) 143/83, pulse 80, temperature 98.1 F (36.7 C), temperature source Oral, resp. rate 20, height 5\' 9"  (1.753 m), weight 185 lb 11.2 oz (84.2 kg), SpO2 100%. Wt Readings from Last 3 Encounters:  12/31/23 185 lb 11.2 oz (84.2 kg)  12/17/23 188 lb (85.3 kg)  12/10/23 187 lb (84.8 kg)   Body mass index is 27.42 kg/m. Performance status (ECOG): 0 - Asymptomatic Physical Exam Constitutional:      Appearance: Normal appearance. He is not ill-appearing.  HENT:     Mouth/Throat:     Mouth: Mucous membranes are moist.     Pharynx: Oropharynx is clear. No oropharyngeal exudate or posterior oropharyngeal erythema.  Cardiovascular:     Rate and Rhythm: Normal rate and regular rhythm.     Heart sounds: No murmur heard.    No friction rub. No gallop.  Pulmonary:     Effort: Pulmonary effort is normal. No respiratory distress.     Breath sounds: Normal breath sounds. No wheezing, rhonchi or rales.  Abdominal:     General: Bowel sounds are normal. There is no distension.     Palpations: Abdomen  is soft. There is no mass.     Tenderness: There is no abdominal tenderness.  Musculoskeletal:        General: No swelling.     Right lower leg: No edema.     Left lower leg: No edema.  Lymphadenopathy:     Cervical: No cervical adenopathy.     Upper Body:     Right upper body: No supraclavicular or axillary adenopathy.     Left upper body: No supraclavicular or axillary adenopathy.     Lower Body: No right inguinal adenopathy. No left inguinal adenopathy.  Skin:    General: Skin is warm.     Coloration: Skin is not jaundiced.     Findings: No lesion or rash.  Neurological:     General: No focal deficit present.     Mental Status: He is alert and oriented to person, place, and time. Mental status is at baseline.  Psychiatric:        Mood and Affect: Mood normal.        Behavior: Behavior normal.        Thought Content: Thought content normal.    LABS:      Latest Ref Rng & Units 12/31/2023   10:11 AM 12/10/2023   11:06 AM 12/05/2023   11:14 AM  CBC  WBC 4.0 - 10.5 K/uL 5.6  7.0  6.8   Hemoglobin 13.0 - 17.0 g/dL 84.1  9.2  9.2   Hematocrit 39.0 - 52.0 % 33.9  31.2  30.4   Platelets 150 - 400 K/uL 132  178  166       Latest Ref Rng & Units 12/31/2023   10:11 AM 12/10/2023   11:06 AM 12/05/2023   11:14 AM  CMP  Glucose 70 - 99 mg/dL 846  92  962   BUN 8 - 23 mg/dL 12  13  13    Creatinine 0.61 - 1.24 mg/dL 9.52  8.41  3.24   Sodium 135 - 145 mmol/L 139  137  139   Potassium 3.5 - 5.1 mmol/L 4.5  4.4  4.3   Chloride 98 - 111 mmol/L 103  103  104   CO2 22 - 32 mmol/L 25  24  26    Calcium  8.9 - 10.3 mg/dL 9.8  9.5  9.4   Total Protein 6.5 - 8.1 g/dL 7.2  6.8  6.7   Total Bilirubin 0.0 - 1.2 mg/dL 0.5  0.4  0.3   Alkaline Phos 38 - 126 U/L 67  65  65   AST 15 - 41 U/L 21  18  18    ALT 0 - 44 U/L 14  9  10     Lab Results  Component Value Date   CEA 13.98 (H) 10/28/2023   PATHOLOGY:   His ultrasound-guided biopsy revealed the following: LIVER, MASS, CORE BIOPSY   Metastatic moderately differentiated adenocarcinoma cytohistologically  consistent with rectal primary  ---------------------------------------------------------------------------------- The pathologic results from his colonoscopy reveled the following: FINAL DIAGNOSIS        1. Colon, polyp(s), transverse, ascending, and cecum (6) :       - TUBULAR ADENOMA(S)       - NEGATIVE FOR HIGH-GRADE DYSPLASIA OR MALIGNANCY        2. Sigmoid  Colon Polyp, (2) :       - TUBULAR ADENOMA(S)       - NEGATIVE FOR HIGH-GRADE DYSPLASIA OR MALIGNANCY        3. Rectum, biopsy,  :       - AT LEAST INTRAMUCOSAL ADENOCARCINOMA, SEE NOTE        Diagnosis Note : Depth of invasion cannot be judged due to the superficial       nature of the biopsies.   STUDIES:  His abdominal MRI revealed the following:  FINDINGS: Lower chest: Unremarkable.   Hepatobiliary: Numerous (on the order of 30) tiny lesions are seen scattered through both hepatic lobes ranging in size from 7 mm up to a maximum size of approximately 10 mm (see axial diffusion series 16 and axial T2 series 14). These restrict diffusion and do not have T2 imaging characteristics compatible with cysts or benign biliary hamartomas. After IV contrast administration some lesion show peripheral enhancement. 20 minute delayed hepatocyte phase imaging shows no accumulation of contrast within the lesions.   There is no evidence for gallstones, gallbladder wall thickening, or pericholecystic fluid. No intrahepatic or extrahepatic biliary dilation.   Pancreas: No focal mass lesion. No dilatation of the main duct. No intraparenchymal cyst. No peripancreatic edema.   Spleen:  No splenomegaly. No suspicious focal mass lesion.   Adrenals/Urinary Tract: No adrenal nodule or mass. Kidneys unremarkable.   Stomach/Bowel: Stomach is unremarkable. No gastric wall thickening. No evidence of outlet obstruction. Duodenum is normally positioned as is the  ligament of Treitz. No small bowel or colonic dilatation within the visualized abdomen.   Vascular/Lymphatic: No abdominal aortic aneurysm. No retroperitoneal or  mesenteric lymphadenopathy. Upper normal lymph nodes are seen in the hepatoduodenal ligament and gastrohepatic ligament.   Other:  No intraperitoneal free fluid.   Musculoskeletal: No focal suspicious marrow enhancement within the visualized bony anatomy.   IMPRESSION: 1. Numerous (on the order of 30) tiny lesions scattered through both hepatic lobes. These restrict diffusion and do not have T2 imaging characteristics compatible with cysts or benign biliary hamartomas. Some lesions show peripheral enhancement. Imaging features are highly suspicious for metastatic disease. 2. Upper normal lymph nodes in the hepatoduodenal ligament and gastrohepatic ligament. These are nonspecific but could be metastatic. Attention on follow-up recommended. 3. No other acute findings in the abdomen.  ASSESSMENT & PLAN:  A 73 y.o. male who clinically has stage IVA (T3 N2 M1a) rectal cancer.  He will proceed with his second cycle of FOLFOX/Avastin  today.  Clinically, he is doing very well.  I will see him back in 2 weeks before he heads into his third cycle of FOLFOX/Avastin .  The patient understands all the plans discussed today and is in agreement with them.  Suhana Wilner Felicia Horde, MD

## 2023-12-31 ENCOUNTER — Other Ambulatory Visit: Payer: Self-pay

## 2023-12-31 ENCOUNTER — Inpatient Hospital Stay

## 2023-12-31 ENCOUNTER — Telehealth: Payer: Self-pay | Admitting: Oncology

## 2023-12-31 ENCOUNTER — Inpatient Hospital Stay: Admitting: Oncology

## 2023-12-31 VITALS — BP 143/83 | HR 80 | Temp 98.1°F | Resp 20 | Ht 69.0 in | Wt 185.7 lb

## 2023-12-31 DIAGNOSIS — C20 Malignant neoplasm of rectum: Secondary | ICD-10-CM

## 2023-12-31 DIAGNOSIS — C787 Secondary malignant neoplasm of liver and intrahepatic bile duct: Secondary | ICD-10-CM | POA: Diagnosis not present

## 2023-12-31 DIAGNOSIS — Z5111 Encounter for antineoplastic chemotherapy: Secondary | ICD-10-CM | POA: Diagnosis not present

## 2023-12-31 LAB — CBC WITH DIFFERENTIAL (CANCER CENTER ONLY)
Abs Immature Granulocytes: 0.02 10*3/uL (ref 0.00–0.07)
Basophils Absolute: 0 10*3/uL (ref 0.0–0.1)
Basophils Relative: 0 %
Eosinophils Absolute: 0.2 10*3/uL (ref 0.0–0.5)
Eosinophils Relative: 3 %
HCT: 33.9 % — ABNORMAL LOW (ref 39.0–52.0)
Hemoglobin: 10.4 g/dL — ABNORMAL LOW (ref 13.0–17.0)
Immature Granulocytes: 0 %
Lymphocytes Relative: 18 %
Lymphs Abs: 1 10*3/uL (ref 0.7–4.0)
MCH: 24.6 pg — ABNORMAL LOW (ref 26.0–34.0)
MCHC: 30.7 g/dL (ref 30.0–36.0)
MCV: 80.1 fL (ref 80.0–100.0)
Monocytes Absolute: 0.5 10*3/uL (ref 0.1–1.0)
Monocytes Relative: 10 %
Neutro Abs: 3.8 10*3/uL (ref 1.7–7.7)
Neutrophils Relative %: 69 %
Platelet Count: 132 10*3/uL — ABNORMAL LOW (ref 150–400)
RBC: 4.23 MIL/uL (ref 4.22–5.81)
RDW: 16.1 % — ABNORMAL HIGH (ref 11.5–15.5)
WBC Count: 5.6 10*3/uL (ref 4.0–10.5)
nRBC: 0 % (ref 0.0–0.2)

## 2023-12-31 LAB — CMP (CANCER CENTER ONLY)
ALT: 14 U/L (ref 0–44)
AST: 21 U/L (ref 15–41)
Albumin: 4.1 g/dL (ref 3.5–5.0)
Alkaline Phosphatase: 67 U/L (ref 38–126)
Anion gap: 11 (ref 5–15)
BUN: 12 mg/dL (ref 8–23)
CO2: 25 mmol/L (ref 22–32)
Calcium: 9.8 mg/dL (ref 8.9–10.3)
Chloride: 103 mmol/L (ref 98–111)
Creatinine: 0.81 mg/dL (ref 0.61–1.24)
GFR, Estimated: >60 mL/min (ref >60)
Glucose, Bld: 161 mg/dL — ABNORMAL HIGH (ref 70–99)
Potassium: 4.5 mmol/L (ref 3.5–5.1)
Sodium: 139 mmol/L (ref 135–145)
Total Bilirubin: 0.5 mg/dL (ref 0.0–1.2)
Total Protein: 7.2 g/dL (ref 6.5–8.1)

## 2023-12-31 MED ORDER — FLUOROURACIL CHEMO INJECTION 2.5 GM/50ML
400.0000 mg/m2 | Freq: Once | INTRAVENOUS | Status: AC
  Administered 2023-12-31: 800 mg via INTRAVENOUS
  Filled 2023-12-31: qty 16

## 2023-12-31 MED ORDER — SODIUM CHLORIDE 0.9 % IV SOLN
5.0000 mg/kg | Freq: Once | INTRAVENOUS | Status: AC
  Administered 2023-12-31: 400 mg via INTRAVENOUS
  Filled 2023-12-31: qty 16

## 2023-12-31 MED ORDER — DEXTROSE 5 % IV SOLN
INTRAVENOUS | Status: DC
Start: 2023-12-31 — End: 2023-12-31

## 2023-12-31 MED ORDER — DEXAMETHASONE SODIUM PHOSPHATE 10 MG/ML IJ SOLN
10.0000 mg | Freq: Once | INTRAMUSCULAR | Status: AC
  Administered 2023-12-31: 10 mg via INTRAVENOUS
  Filled 2023-12-31: qty 1

## 2023-12-31 MED ORDER — OXALIPLATIN CHEMO INJECTION 100 MG/20ML
85.0000 mg/m2 | Freq: Once | INTRAVENOUS | Status: AC
  Administered 2023-12-31: 175 mg via INTRAVENOUS
  Filled 2023-12-31: qty 35

## 2023-12-31 MED ORDER — LEUCOVORIN CALCIUM INJECTION 350 MG
400.0000 mg/m2 | Freq: Once | INTRAVENOUS | Status: AC
  Administered 2023-12-31: 816 mg via INTRAVENOUS
  Filled 2023-12-31: qty 40.8

## 2023-12-31 MED ORDER — SODIUM CHLORIDE 0.9 % IV SOLN
2400.0000 mg/m2 | INTRAVENOUS | Status: DC
  Administered 2023-12-31: 5000 mg via INTRAVENOUS
  Filled 2023-12-31: qty 100

## 2023-12-31 MED ORDER — SODIUM CHLORIDE 0.9 % IV SOLN: INTRAVENOUS | Status: DC

## 2023-12-31 MED ORDER — PALONOSETRON HCL INJECTION 0.25 MG/5ML
0.2500 mg | Freq: Once | INTRAVENOUS | Status: AC
  Administered 2023-12-31: 0.25 mg via INTRAVENOUS
  Filled 2023-12-31: qty 5

## 2023-12-31 NOTE — Telephone Encounter (Signed)
 Patient has been scheduled for follow-up visit per 12/30/23 LOS.  Pt given an appt calendar with date and time.

## 2023-12-31 NOTE — Patient Instructions (Signed)
 Fluorouracil Injection What is this medication? FLUOROURACIL (flure oh YOOR a sil) treats some types of cancer. It works by slowing down the growth of cancer cells. This medicine may be used for other purposes; ask your health care provider or pharmacist if you have questions. COMMON BRAND NAME(S): Adrucil What should I tell my care team before I take this medication? They need to know if you have any of these conditions: Blood disorders Dihydropyrimidine dehydrogenase (DPD) deficiency Infection, such as chickenpox, cold sores, herpes Kidney disease Liver disease Poor nutrition Recent or ongoing radiation therapy An unusual or allergic reaction to fluorouracil, other medications, foods, dyes, or preservatives If you or your partner are pregnant or trying to get pregnant Breast-feeding How should I use this medication? This medication is injected into a vein. It is administered by your care team in a hospital or clinic setting. Talk to your care team about the use of this medication in children. Special care may be needed. Overdosage: If you think you have taken too much of this medicine contact a poison control center or emergency room at once. NOTE: This medicine is only for you. Do not share this medicine with others. What if I miss a dose? Keep appointments for follow-up doses. It is important not to miss your dose. Call your care team if you are unable to keep an appointment. What may interact with this medication? Do not take this medication with any of the following: Live virus vaccines This medication may also interact with the following: Medications that treat or prevent blood clots, such as warfarin, enoxaparin, dalteparin This list may not describe all possible interactions. Give your health care provider a list of all the medicines, herbs, non-prescription drugs, or dietary supplements you use. Also tell them if you smoke, drink alcohol, or use illegal drugs. Some items may  interact with your medicine. What should I watch for while using this medication? Your condition will be monitored carefully while you are receiving this medication. This medication may make you feel generally unwell. This is not uncommon as chemotherapy can affect healthy cells as well as cancer cells. Report any side effects. Continue your course of treatment even though you feel ill unless your care team tells you to stop. In some cases, you may be given additional medications to help with side effects. Follow all directions for their use. This medication may increase your risk of getting an infection. Call your care team for advice if you get a fever, chills, sore throat, or other symptoms of a cold or flu. Do not treat yourself. Try to avoid being around people who are sick. This medication may increase your risk to bruise or bleed. Call your care team if you notice any unusual bleeding. Be careful brushing or flossing your teeth or using a toothpick because you may get an infection or bleed more easily. If you have any dental work done, tell your dentist you are receiving this medication. Avoid taking medications that contain aspirin, acetaminophen, ibuprofen, naproxen, or ketoprofen unless instructed by your care team. These medications may hide a fever. Do not treat diarrhea with over the counter products. Contact your care team if you have diarrhea that lasts more than 2 days or if it is severe and watery. This medication can make you more sensitive to the sun. Keep out of the sun. If you cannot avoid being in the sun, wear protective clothing and sunscreen. Do not use sun lamps, tanning beds, or tanning booths. Talk to  your care team if you or your partner wish to become pregnant or think you might be pregnant. This medication can cause serious birth defects if taken during pregnancy and for 3 months after the last dose. A reliable form of contraception is recommended while taking this  medication and for 3 months after the last dose. Talk to your care team about effective forms of contraception. Do not father a child while taking this medication and for 3 months after the last dose. Use a condom while having sex during this time period. Do not breastfeed while taking this medication. This medication may cause infertility. Talk to your care team if you are concerned about your fertility. What side effects may I notice from receiving this medication? Side effects that you should report to your care team as soon as possible: Allergic reactions--skin rash, itching, hives, swelling of the face, lips, tongue, or throat Heart attack--pain or tightness in the chest, shoulders, arms, or jaw, nausea, shortness of breath, cold or clammy skin, feeling faint or lightheaded Heart failure--shortness of breath, swelling of the ankles, feet, or hands, sudden weight gain, unusual weakness or fatigue Heart rhythm changes--fast or irregular heartbeat, dizziness, feeling faint or lightheaded, chest pain, trouble breathing High ammonia level--unusual weakness or fatigue, confusion, loss of appetite, nausea, vomiting, seizures Infection--fever, chills, cough, sore throat, wounds that don't heal, pain or trouble when passing urine, general feeling of discomfort or being unwell Low red blood cell level--unusual weakness or fatigue, dizziness, headache, trouble breathing Pain, tingling, or numbness in the hands or feet, muscle weakness, change in vision, confusion or trouble speaking, loss of balance or coordination, trouble walking, seizures Redness, swelling, and blistering of the skin over hands and feet Severe or prolonged diarrhea Unusual bruising or bleeding Side effects that usually do not require medical attention (report to your care team if they continue or are bothersome): Dry skin Headache Increased tears Nausea Pain, redness, or swelling with sores inside the mouth or throat Sensitivity  to light Vomiting This list may not describe all possible side effects. Call your doctor for medical advice about side effects. You may report side effects to FDA at 1-800-FDA-1088. Where should I keep my medication? This medication is given in a hospital or clinic. It will not be stored at home. NOTE: This sheet is a summary. It may not cover all possible information. If you have questions about this medicine, talk to your doctor, pharmacist, or health care provider.  2024 Elsevier/Gold Standard (2021-12-05 00:00:00)Leucovorin Injection What is this medication? LEUCOVORIN (loo koe VOR in) prevents side effects from certain medications, such as methotrexate. It works by increasing folate levels. This helps protect healthy cells in your body. It may also be used to treat anemia caused by low levels of folate. It can also be used with fluorouracil, a type of chemotherapy, to treat colorectal cancer. It works by increasing the effects of fluorouracil in the body. This medicine may be used for other purposes; ask your health care provider or pharmacist if you have questions. What should I tell my care team before I take this medication? They need to know if you have any of these conditions: Anemia from low levels of vitamin B12 in the blood An unusual or allergic reaction to leucovorin, folic acid, other medications, foods, dyes, or preservatives Pregnant or trying to get pregnant Breastfeeding How should I use this medication? This medication is injected into a vein or a muscle. It is given by your care team in  a hospital or clinic setting. Talk to your care team about the use of this medication in children. Special care may be needed. Overdosage: If you think you have taken too much of this medicine contact a poison control center or emergency room at once. NOTE: This medicine is only for you. Do not share this medicine with others. What if I miss a dose? Keep appointments for follow-up doses.  It is important not to miss your dose. Call your care team if you are unable to keep an appointment. What may interact with this medication? Capecitabine Fluorouracil Phenobarbital Phenytoin Primidone Trimethoprim;sulfamethoxazole This list may not describe all possible interactions. Give your health care provider a list of all the medicines, herbs, non-prescription drugs, or dietary supplements you use. Also tell them if you smoke, drink alcohol, or use illegal drugs. Some items may interact with your medicine. What should I watch for while using this medication? Your condition will be monitored carefully while you are receiving this medication. This medication may increase the side effects of 5-fluorouracil. Tell your care team if you have diarrhea or mouth sores that do not get better or that get worse. What side effects may I notice from receiving this medication? Side effects that you should report to your care team as soon as possible: Allergic reactions--skin rash, itching, hives, swelling of the face, lips, tongue, or throat This list may not describe all possible side effects. Call your doctor for medical advice about side effects. You may report side effects to FDA at 1-800-FDA-1088. Where should I keep my medication? This medication is given in a hospital or clinic. It will not be stored at home. NOTE: This sheet is a summary. It may not cover all possible information. If you have questions about this medicine, talk to your doctor, pharmacist, or health care provider.  2024 Elsevier/Gold Standard (2022-01-02 00:00:00)Oxaliplatin Injection What is this medication? OXALIPLATIN (ox AL i PLA tin) treats colorectal cancer. It works by slowing down the growth of cancer cells. This medicine may be used for other purposes; ask your health care provider or pharmacist if you have questions. COMMON BRAND NAME(S): Eloxatin What should I tell my care team before I take this medication? They  need to know if you have any of these conditions: Heart disease History of irregular heartbeat or rhythm Liver disease Low blood cell levels (white cells, red cells, and platelets) Lung or breathing disease, such as asthma Take medications that treat or prevent blood clots Tingling of the fingers, toes, or other nerve disorder An unusual or allergic reaction to oxaliplatin, other medications, foods, dyes, or preservatives If you or your partner are pregnant or trying to get pregnant Breast-feeding How should I use this medication? This medication is injected into a vein. It is given by your care team in a hospital or clinic setting. Talk to your care team about the use of this medication in children. Special care may be needed. Overdosage: If you think you have taken too much of this medicine contact a poison control center or emergency room at once. NOTE: This medicine is only for you. Do not share this medicine with others. What if I miss a dose? Keep appointments for follow-up doses. It is important not to miss a dose. Call your care team if you are unable to keep an appointment. What may interact with this medication? Do not take this medication with any of the following: Cisapride Dronedarone Pimozide Thioridazine This medication may also interact with the following:  Aspirin and aspirin-like medications Certain medications that treat or prevent blood clots, such as warfarin, apixaban, dabigatran, and rivaroxaban Cisplatin Cyclosporine Diuretics Medications for infection, such as acyclovir, adefovir, amphotericin B, bacitracin, cidofovir, foscarnet, ganciclovir, gentamicin, pentamidine, vancomycin NSAIDs, medications for pain and inflammation, such as ibuprofen or naproxen Other medications that cause heart rhythm changes Pamidronate Zoledronic acid This list may not describe all possible interactions. Give your health care provider a list of all the medicines, herbs,  non-prescription drugs, or dietary supplements you use. Also tell them if you smoke, drink alcohol, or use illegal drugs. Some items may interact with your medicine. What should I watch for while using this medication? Your condition will be monitored carefully while you are receiving this medication. You may need blood work while taking this medication. This medication may make you feel generally unwell. This is not uncommon as chemotherapy can affect healthy cells as well as cancer cells. Report any side effects. Continue your course of treatment even though you feel ill unless your care team tells you to stop. This medication may increase your risk of getting an infection. Call your care team for advice if you get a fever, chills, sore throat, or other symptoms of a cold or flu. Do not treat yourself. Try to avoid being around people who are sick. Avoid taking medications that contain aspirin, acetaminophen, ibuprofen, naproxen, or ketoprofen unless instructed by your care team. These medications may hide a fever. Be careful brushing or flossing your teeth or using a toothpick because you may get an infection or bleed more easily. If you have any dental work done, tell your dentist you are receiving this medication. This medication can make you more sensitive to cold. Do not drink cold drinks or use ice. Cover exposed skin before coming in contact with cold temperatures or cold objects. When out in cold weather wear warm clothing and cover your mouth and nose to warm the air that goes into your lungs. Tell your care team if you get sensitive to the cold. Talk to your care team if you or your partner are pregnant or think either of you might be pregnant. This medication can cause serious birth defects if taken during pregnancy and for 9 months after the last dose. A negative pregnancy test is required before starting this medication. A reliable form of contraception is recommended while taking this  medication and for 9 months after the last dose. Talk to your care team about effective forms of contraception. Do not father a child while taking this medication and for 6 months after the last dose. Use a condom while having sex during this time period. Do not breastfeed while taking this medication and for 3 months after the last dose. This medication may cause infertility. Talk to your care team if you are concerned about your fertility. What side effects may I notice from receiving this medication? Side effects that you should report to your care team as soon as possible: Allergic reactions--skin rash, itching, hives, swelling of the face, lips, tongue, or throat Bleeding--bloody or black, tar-like stools, vomiting blood or brown material that looks like coffee grounds, red or dark brown urine, small red or purple spots on skin, unusual bruising or bleeding Dry cough, shortness of breath or trouble breathing Heart rhythm changes--fast or irregular heartbeat, dizziness, feeling faint or lightheaded, chest pain, trouble breathing Infection--fever, chills, cough, sore throat, wounds that don't heal, pain or trouble when passing urine, general feeling of discomfort or being unwell  Liver injury--right upper belly pain, loss of appetite, nausea, light-colored stool, dark yellow or brown urine, yellowing skin or eyes, unusual weakness or fatigue Low red blood cell level--unusual weakness or fatigue, dizziness, headache, trouble breathing Muscle injury--unusual weakness or fatigue, muscle pain, dark yellow or brown urine, decrease in amount of urine Pain, tingling, or numbness in the hands or feet Sudden and severe headache, confusion, change in vision, seizures, which may be signs of posterior reversible encephalopathy syndrome (PRES) Unusual bruising or bleeding Side effects that usually do not require medical attention (report to your care team if they continue or are  bothersome): Diarrhea Nausea Pain, redness, or swelling with sores inside the mouth or throat Unusual weakness or fatigue Vomiting This list may not describe all possible side effects. Call your doctor for medical advice about side effects. You may report side effects to FDA at 1-800-FDA-1088. Where should I keep my medication? This medication is given in a hospital or clinic. It will not be stored at home. NOTE: This sheet is a summary. It may not cover all possible information. If you have questions about this medicine, talk to your doctor, pharmacist, or health care provider.  2024 Elsevier/Gold Standard (2023-07-12 00:00:00)

## 2024-01-02 ENCOUNTER — Ambulatory Visit: Payer: PPO | Admitting: Family Medicine

## 2024-01-02 ENCOUNTER — Inpatient Hospital Stay

## 2024-01-02 VITALS — BP 135/68 | HR 100 | Temp 98.8°F | Resp 18 | Ht 69.0 in

## 2024-01-02 DIAGNOSIS — Z5111 Encounter for antineoplastic chemotherapy: Secondary | ICD-10-CM | POA: Diagnosis not present

## 2024-01-02 DIAGNOSIS — Z452 Encounter for adjustment and management of vascular access device: Secondary | ICD-10-CM | POA: Insufficient documentation

## 2024-01-02 MED ORDER — SODIUM CHLORIDE 0.9% FLUSH
10.0000 mL | INTRAVENOUS | Status: DC | PRN
  Administered 2024-01-02: 10 mL

## 2024-01-02 MED ORDER — HEPARIN SOD (PORK) LOCK FLUSH 100 UNIT/ML IV SOLN
500.0000 [IU] | Freq: Once | INTRAVENOUS | Status: AC | PRN
Start: 2024-01-02 — End: 2024-01-02
  Administered 2024-01-02: 500 [IU]

## 2024-01-08 NOTE — Progress Notes (Cosign Needed)
 [facility]  [address] [place],  [place]  [address] [phone]  Clinic Day:  12/31/2023  Referring physician: Mercy Stall, MD   HISTORY OF PRESENT ILLNESS:  The patient is a 73 y.o. 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  He comes in today to be evaluated before heading into his 3rd cycle of FOLFOX/Avastin .  The patient claims to have tolerated his 2nd cycle of palliative chemotherapy well.  He denies recurrent rectal bleeding.  He denies other symptoms which concern him for disease progression while on chemotherapy.  He states he bent over and hurt his right lower ribs the other day.  He denies chest pain with deep inspiration.  He has lost some weight, but states he has started protein shakes daily.  PHYSICAL EXAM:  Blood pressure 130/80, pulse 85, temperature 98.3 F (36.8 C), temperature source Oral, resp. rate 18, height 5\' 9"  (1.753 m), weight 181 lb 4.8 oz (82.2 kg), SpO2 100%. Wt Readings from Last 3 Encounters:  01/14/24 181 lb 4.8 oz (82.2 kg)  12/31/23 185 lb 11.2 oz (84.2 kg)  12/17/23 188 lb (85.3 kg)   Body mass index is 26.77 kg/m. Performance status (ECOG): 0 - Asymptomatic   Physical Exam Vitals and nursing note reviewed.  Constitutional:      General: He is not in acute distress.    Appearance: Normal appearance. He is normal weight.  HENT:     Head: Normocephalic and atraumatic.     Mouth/Throat:     Mouth: Mucous membranes are moist.     Pharynx: Oropharynx is clear. No oropharyngeal exudate or posterior oropharyngeal erythema.  Eyes:     General: No scleral icterus.    Extraocular Movements: Extraocular movements intact.     Conjunctiva/sclera: Conjunctivae normal.     Pupils: Pupils are equal, round, and reactive to light.  Cardiovascular:     Rate and Rhythm: Normal rate and regular rhythm.     Heart sounds: Normal heart sounds. No murmur heard.    No friction rub. No  gallop.  Pulmonary:     Effort: Pulmonary effort is normal.     Breath sounds: Normal breath sounds. No wheezing, rhonchi or rales.  Chest:     Chest wall: Tenderness (right lower ribs) present.  Abdominal:     General: Bowel sounds are normal. There is no distension.     Palpations: Abdomen is soft. There is no hepatomegaly, splenomegaly or mass.     Tenderness: There is no abdominal tenderness.  Musculoskeletal:        General: Normal range of motion.     Cervical back: Normal range of motion and neck supple. No tenderness.     Right lower leg: No edema.     Left lower leg: No edema.  Lymphadenopathy:     Cervical: No cervical adenopathy.  Skin:    General: Skin is warm and dry.     Coloration: Skin is not jaundiced.     Findings: No rash.  Neurological:     Mental Status: He is alert and oriented to person, place, and time.     Cranial Nerves: No cranial nerve deficit.  Psychiatric:        Mood and Affect: Mood normal.        Behavior: Behavior normal.        Thought Content: Thought content normal.      LABS:      Latest  Ref Rng & Units 01/14/2024    8:48 AM 01/13/2024    8:15 AM 12/31/2023   10:11 AM  CBC  WBC 4.0 - 10.5 K/uL 5.0  5.4  5.6   Hemoglobin 13.0 - 17.0 g/dL 14.7  82.9  56.2   Hematocrit 39.0 - 52.0 % 32.7  35.4  33.9   Platelets 150 - 400 K/uL 125  122  132       Latest Ref Rng & Units 01/14/2024    8:48 AM 01/13/2024    8:15 AM 12/31/2023   10:11 AM  CMP  Glucose 70 - 99 mg/dL 130  865  784   BUN 8 - 23 mg/dL 11  12  12    Creatinine 0.61 - 1.24 mg/dL 6.96  2.95  2.84   Sodium 135 - 145 mmol/L 137  141  139   Potassium 3.5 - 5.1 mmol/L 4.3  4.8  4.5   Chloride 98 - 111 mmol/L 102  104  103   CO2 22 - 32 mmol/L 25  22  25    Calcium  8.9 - 10.3 mg/dL 9.4  9.5  9.8   Total Protein 6.5 - 8.1 g/dL 6.8  6.9  7.2   Total Bilirubin 0.0 - 1.2 mg/dL 0.6  0.6  0.5   Alkaline Phos 38 - 126 U/L 66  72  67   AST 15 - 41 U/L 22  24  21    ALT 0 - 44 U/L 15  18  14      Lab Results  Component Value Date   CEA 13.98 (H) 10/28/2023   PATHOLOGY:   His ultrasound-guided biopsy revealed the following: LIVER, MASS, CORE BIOPSY  Metastatic moderately differentiated adenocarcinoma cytohistologically  consistent with rectal primary  ---------------------------------------------------------------------------------- The pathologic results from his colonoscopy reveled the following: FINAL DIAGNOSIS        1. Colon, polyp(s), transverse, ascending, and cecum (6) :       - TUBULAR ADENOMA(S)       - NEGATIVE FOR HIGH-GRADE DYSPLASIA OR MALIGNANCY        2. Sigmoid  Colon Polyp, (2) :       - TUBULAR ADENOMA(S)       - NEGATIVE FOR HIGH-GRADE DYSPLASIA OR MALIGNANCY        3. Rectum, biopsy,  :       - AT LEAST INTRAMUCOSAL ADENOCARCINOMA, SEE NOTE        Diagnosis Note : Depth of invasion cannot be judged due to the superficial       nature of the biopsies.   STUDIES:    His abdominal MRI revealed the following: FINDINGS: Lower chest: Unremarkable.   Hepatobiliary: Numerous (on the order of 30) tiny lesions are seen scattered through both hepatic lobes ranging in size from 7 mm up to a maximum size of approximately 10 mm (see axial diffusion series 16 and axial T2 series 14). These restrict diffusion and do not have T2 imaging characteristics compatible with cysts or benign biliary hamartomas. After IV contrast administration some lesion show peripheral enhancement. 20 minute delayed hepatocyte phase imaging shows no accumulation of contrast within the lesions.   There is no evidence for gallstones, gallbladder wall thickening, or pericholecystic fluid. No intrahepatic or extrahepatic biliary dilation.   Pancreas: No focal mass lesion. No dilatation of the main duct. No intraparenchymal cyst. No peripancreatic edema.   Spleen:  No splenomegaly. No suspicious focal mass lesion.   Adrenals/Urinary Tract: No adrenal nodule or mass.  Kidneys unremarkable.   Stomach/Bowel: Stomach is unremarkable. No gastric wall thickening. No evidence of outlet obstruction. Duodenum is normally positioned as is the ligament of Treitz. No small bowel or colonic dilatation within the visualized abdomen.   Vascular/Lymphatic: No abdominal aortic aneurysm. No retroperitoneal or mesenteric lymphadenopathy. Upper normal lymph nodes are seen in the hepatoduodenal ligament and gastrohepatic ligament.   Other:  No intraperitoneal free fluid.   Musculoskeletal: No focal suspicious marrow enhancement within the visualized bony anatomy.   IMPRESSION: 1. Numerous (on the order of 30) tiny lesions scattered through both hepatic lobes. These restrict diffusion and do not have T2 imaging characteristics compatible with cysts or benign biliary hamartomas. Some lesions show peripheral enhancement. Imaging features are highly suspicious for metastatic disease. 2. Upper normal lymph nodes in the hepatoduodenal ligament and gastrohepatic ligament. These are nonspecific but could be metastatic. Attention on follow-up recommended. 3. No other acute findings in the abdomen.  ASSESSMENT & PLAN:  A 73 y.o. male who clinically has stage IVA (T3 N2 M1a) rectal cancer. Clinically, he is doing well.  He will proceed with his 3rd cycle of FOLFOX/Avastin  today.   We will see him back in 2 weeks before he heads into his 4th cycle of FOLFOX/Avastin .  The patient understands all the plans discussed today and is in agreement with them.  Azahel Belcastro A Geralene Afshar, PA-C

## 2024-01-11 ENCOUNTER — Other Ambulatory Visit: Payer: Self-pay

## 2024-01-11 DIAGNOSIS — I119 Hypertensive heart disease without heart failure: Secondary | ICD-10-CM

## 2024-01-11 DIAGNOSIS — E782 Mixed hyperlipidemia: Secondary | ICD-10-CM

## 2024-01-11 DIAGNOSIS — E1142 Type 2 diabetes mellitus with diabetic polyneuropathy: Secondary | ICD-10-CM

## 2024-01-13 ENCOUNTER — Other Ambulatory Visit

## 2024-01-13 ENCOUNTER — Encounter: Payer: Self-pay | Admitting: Oncology

## 2024-01-13 DIAGNOSIS — Z794 Long term (current) use of insulin: Secondary | ICD-10-CM | POA: Diagnosis not present

## 2024-01-13 DIAGNOSIS — E1142 Type 2 diabetes mellitus with diabetic polyneuropathy: Secondary | ICD-10-CM | POA: Diagnosis not present

## 2024-01-13 DIAGNOSIS — I119 Hypertensive heart disease without heart failure: Secondary | ICD-10-CM | POA: Diagnosis not present

## 2024-01-13 DIAGNOSIS — E782 Mixed hyperlipidemia: Secondary | ICD-10-CM | POA: Diagnosis not present

## 2024-01-13 DIAGNOSIS — N138 Other obstructive and reflux uropathy: Secondary | ICD-10-CM

## 2024-01-14 ENCOUNTER — Inpatient Hospital Stay: Attending: Oncology | Admitting: Hematology and Oncology

## 2024-01-14 ENCOUNTER — Encounter: Payer: Self-pay | Admitting: Oncology

## 2024-01-14 ENCOUNTER — Encounter: Payer: Self-pay | Admitting: Hematology and Oncology

## 2024-01-14 ENCOUNTER — Inpatient Hospital Stay

## 2024-01-14 ENCOUNTER — Encounter: Payer: Self-pay | Admitting: Family Medicine

## 2024-01-14 ENCOUNTER — Ambulatory Visit (INDEPENDENT_AMBULATORY_CARE_PROVIDER_SITE_OTHER): Admitting: Family Medicine

## 2024-01-14 VITALS — BP 130/80 | HR 85 | Temp 98.3°F | Resp 18 | Ht 69.0 in | Wt 181.3 lb

## 2024-01-14 VITALS — BP 126/72 | HR 80 | Temp 98.0°F | Ht 69.0 in | Wt 181.0 lb

## 2024-01-14 DIAGNOSIS — Z79899 Other long term (current) drug therapy: Secondary | ICD-10-CM | POA: Diagnosis not present

## 2024-01-14 DIAGNOSIS — I2510 Atherosclerotic heart disease of native coronary artery without angina pectoris: Secondary | ICD-10-CM | POA: Diagnosis not present

## 2024-01-14 DIAGNOSIS — R42 Dizziness and giddiness: Secondary | ICD-10-CM | POA: Diagnosis not present

## 2024-01-14 DIAGNOSIS — C20 Malignant neoplasm of rectum: Secondary | ICD-10-CM

## 2024-01-14 DIAGNOSIS — E782 Mixed hyperlipidemia: Secondary | ICD-10-CM | POA: Diagnosis not present

## 2024-01-14 DIAGNOSIS — Z5111 Encounter for antineoplastic chemotherapy: Secondary | ICD-10-CM | POA: Insufficient documentation

## 2024-01-14 DIAGNOSIS — Z794 Long term (current) use of insulin: Secondary | ICD-10-CM | POA: Diagnosis not present

## 2024-01-14 DIAGNOSIS — I119 Hypertensive heart disease without heart failure: Secondary | ICD-10-CM | POA: Diagnosis not present

## 2024-01-14 DIAGNOSIS — E1142 Type 2 diabetes mellitus with diabetic polyneuropathy: Secondary | ICD-10-CM

## 2024-01-14 DIAGNOSIS — R110 Nausea: Secondary | ICD-10-CM | POA: Diagnosis not present

## 2024-01-14 DIAGNOSIS — C787 Secondary malignant neoplasm of liver and intrahepatic bile duct: Secondary | ICD-10-CM

## 2024-01-14 DIAGNOSIS — T451X5A Adverse effect of antineoplastic and immunosuppressive drugs, initial encounter: Secondary | ICD-10-CM | POA: Diagnosis not present

## 2024-01-14 LAB — CBC WITH DIFFERENTIAL/PLATELET
Basophils Absolute: 0 10*3/uL (ref 0.0–0.2)
Basos: 1 %
EOS (ABSOLUTE): 0.1 10*3/uL (ref 0.0–0.4)
Eos: 3 %
Hematocrit: 35.4 % — ABNORMAL LOW (ref 37.5–51.0)
Hemoglobin: 10.6 g/dL — ABNORMAL LOW (ref 13.0–17.7)
Immature Grans (Abs): 0 10*3/uL (ref 0.0–0.1)
Immature Granulocytes: 0 %
Lymphocytes Absolute: 1 10*3/uL (ref 0.7–3.1)
Lymphs: 19 %
MCH: 24.1 pg — ABNORMAL LOW (ref 26.6–33.0)
MCHC: 29.9 g/dL — ABNORMAL LOW (ref 31.5–35.7)
MCV: 81 fL (ref 79–97)
Monocytes Absolute: 0.6 10*3/uL (ref 0.1–0.9)
Monocytes: 10 %
Neutrophils Absolute: 3.6 10*3/uL (ref 1.4–7.0)
Neutrophils: 67 %
Platelets: 122 10*3/uL — ABNORMAL LOW (ref 150–450)
RBC: 4.39 x10E6/uL (ref 4.14–5.80)
RDW: 16.3 % — ABNORMAL HIGH (ref 11.6–15.4)
WBC: 5.4 10*3/uL (ref 3.4–10.8)

## 2024-01-14 LAB — CMP (CANCER CENTER ONLY)
ALT: 15 U/L (ref 0–44)
AST: 22 U/L (ref 15–41)
Albumin: 4.2 g/dL (ref 3.5–5.0)
Alkaline Phosphatase: 66 U/L (ref 38–126)
Anion gap: 10 (ref 5–15)
BUN: 11 mg/dL (ref 8–23)
CO2: 25 mmol/L (ref 22–32)
Calcium: 9.4 mg/dL (ref 8.9–10.3)
Chloride: 102 mmol/L (ref 98–111)
Creatinine: 0.85 mg/dL (ref 0.61–1.24)
GFR, Estimated: >60 mL/min (ref >60)
Glucose, Bld: 134 mg/dL — ABNORMAL HIGH (ref 70–99)
Potassium: 4.3 mmol/L (ref 3.5–5.1)
Sodium: 137 mmol/L (ref 135–145)
Total Bilirubin: 0.6 mg/dL (ref 0.0–1.2)
Total Protein: 6.8 g/dL (ref 6.5–8.1)

## 2024-01-14 LAB — CBC WITH DIFFERENTIAL (CANCER CENTER ONLY)
Abs Immature Granulocytes: 0.01 10*3/uL (ref 0.00–0.07)
Basophils Absolute: 0 10*3/uL (ref 0.0–0.1)
Basophils Relative: 1 %
Eosinophils Absolute: 0.1 10*3/uL (ref 0.0–0.5)
Eosinophils Relative: 3 %
HCT: 32.7 % — ABNORMAL LOW (ref 39.0–52.0)
Hemoglobin: 10 g/dL — ABNORMAL LOW (ref 13.0–17.0)
Immature Granulocytes: 0 %
Lymphocytes Relative: 16 %
Lymphs Abs: 0.8 10*3/uL (ref 0.7–4.0)
MCH: 24.1 pg — ABNORMAL LOW (ref 26.0–34.0)
MCHC: 30.6 g/dL (ref 30.0–36.0)
MCV: 78.8 fL — ABNORMAL LOW (ref 80.0–100.0)
Monocytes Absolute: 0.5 10*3/uL (ref 0.1–1.0)
Monocytes Relative: 9 %
Neutro Abs: 3.6 10*3/uL (ref 1.7–7.7)
Neutrophils Relative %: 71 %
Platelet Count: 125 10*3/uL — ABNORMAL LOW (ref 150–400)
RBC: 4.15 MIL/uL — ABNORMAL LOW (ref 4.22–5.81)
RDW: 17 % — ABNORMAL HIGH (ref 11.5–15.5)
WBC Count: 5 10*3/uL (ref 4.0–10.5)
nRBC: 0 % (ref 0.0–0.2)

## 2024-01-14 LAB — LIPID PANEL
Chol/HDL Ratio: 3.2 ratio (ref 0.0–5.0)
Cholesterol, Total: 133 mg/dL (ref 100–199)
HDL: 42 mg/dL (ref >39)
LDL Chol Calc (NIH): 74 mg/dL (ref 0–99)
Triglycerides: 87 mg/dL (ref 0–149)
VLDL Cholesterol Cal: 17 mg/dL (ref 5–40)

## 2024-01-14 LAB — COMPREHENSIVE METABOLIC PANEL WITH GFR
ALT: 18 IU/L (ref 0–44)
AST: 24 IU/L (ref 0–40)
Albumin: 4.4 g/dL (ref 3.9–4.9)
Alkaline Phosphatase: 72 IU/L (ref 44–121)
BUN/Creatinine Ratio: 15 (ref 10–24)
BUN: 12 mg/dL (ref 8–27)
Bilirubin Total: 0.6 mg/dL (ref 0.0–1.2)
CO2: 22 mmol/L (ref 20–29)
Calcium: 9.5 mg/dL (ref 8.6–10.2)
Chloride: 104 mmol/L (ref 96–106)
Creatinine, Ser: 0.8 mg/dL (ref 0.76–1.27)
Globulin, Total: 2.5 g/dL (ref 1.5–4.5)
Glucose: 110 mg/dL — ABNORMAL HIGH (ref 70–99)
Potassium: 4.8 mmol/L (ref 3.5–5.2)
Sodium: 141 mmol/L (ref 134–144)
Total Protein: 6.9 g/dL (ref 6.0–8.5)
eGFR: 95 mL/min/{1.73_m2} (ref >59)

## 2024-01-14 LAB — HEMOGLOBIN A1C
Est. average glucose Bld gHb Est-mCnc: 131 mg/dL
Hgb A1c MFr Bld: 6.2 % — ABNORMAL HIGH (ref 4.8–5.6)

## 2024-01-14 LAB — TOTAL PROTEIN, URINE DIPSTICK: Protein, ur: NEGATIVE mg/dL

## 2024-01-14 MED ORDER — FLUOROURACIL CHEMO INJECTION 2.5 GM/50ML
400.0000 mg/m2 | Freq: Once | INTRAVENOUS | Status: AC
  Administered 2024-01-14: 800 mg via INTRAVENOUS
  Filled 2024-01-14: qty 16

## 2024-01-14 MED ORDER — DEXAMETHASONE SODIUM PHOSPHATE 10 MG/ML IJ SOLN
INTRAMUSCULAR | Status: DC
Start: 2024-01-14 — End: 2024-01-14
  Filled 2024-01-14: qty 1

## 2024-01-14 MED ORDER — DEXTROSE 5 % IV SOLN: INTRAVENOUS | Status: DC

## 2024-01-14 MED ORDER — LEUCOVORIN CALCIUM INJECTION 350 MG
400.0000 mg/m2 | Freq: Once | INTRAVENOUS | Status: AC
  Administered 2024-01-14: 816 mg via INTRAVENOUS
  Filled 2024-01-14: qty 40.8

## 2024-01-14 MED ORDER — SODIUM CHLORIDE 0.9 % IV SOLN: INTRAVENOUS | Status: DC

## 2024-01-14 MED ORDER — PALONOSETRON HCL INJECTION 0.25 MG/5ML
0.2500 mg | Freq: Once | INTRAVENOUS | Status: AC
  Administered 2024-01-14: 0.25 mg via INTRAVENOUS

## 2024-01-14 MED ORDER — GVOKE HYPOPEN 2-PACK 0.5 MG/0.1ML ~~LOC~~ SOAJ: 0.5000 mg | Freq: Every day | SUBCUTANEOUS | 2 refills | Status: AC | PRN

## 2024-01-14 MED ORDER — SODIUM CHLORIDE 0.9% FLUSH: 10.0000 mL | INTRAVENOUS | Status: DC | PRN

## 2024-01-14 MED ORDER — OXALIPLATIN CHEMO INJECTION 100 MG/20ML
85.0000 mg/m2 | Freq: Once | INTRAVENOUS | Status: AC
  Administered 2024-01-14: 175 mg via INTRAVENOUS
  Filled 2024-01-14: qty 35

## 2024-01-14 MED ORDER — SODIUM CHLORIDE 0.9 % IV SOLN
2400.0000 mg/m2 | INTRAVENOUS | Status: DC
  Administered 2024-01-14: 5000 mg via INTRAVENOUS
  Filled 2024-01-14: qty 100

## 2024-01-14 MED ORDER — HEPARIN SOD (PORK) LOCK FLUSH 100 UNIT/ML IV SOLN
500.0000 [IU] | Freq: Once | INTRAVENOUS | Status: DC | PRN
Start: 2024-01-14 — End: 2024-01-14

## 2024-01-14 MED ORDER — DEXAMETHASONE SODIUM PHOSPHATE 10 MG/ML IJ SOLN
10.0000 mg | Freq: Once | INTRAMUSCULAR | Status: AC
  Administered 2024-01-14: 10 mg via INTRAVENOUS

## 2024-01-14 MED ORDER — SODIUM CHLORIDE 0.9 % IV SOLN
5.0000 mg/kg | Freq: Once | INTRAVENOUS | Status: AC
  Administered 2024-01-14: 400 mg via INTRAVENOUS
  Filled 2024-01-14: qty 16

## 2024-01-14 MED ORDER — MECLIZINE HCL 25 MG PO TABS: 25.0000 mg | ORAL_TABLET | Freq: Three times a day (TID) | ORAL | 0 refills | Status: AC | PRN

## 2024-01-14 NOTE — Patient Instructions (Signed)
 The chemotherapy medication bag should finish at 46 hours. For example, if your pump is scheduled for 46 hours and it was put on at 4:00 p.m., it should finish at 2:00 p.m. the day it is scheduled to come off regardless of your appointment time.     Estimated time to finish at noon.   If the display on your pump reads "Low Volume" and it is beeping, take the batteries out of the pump and come to the cancer center for it to be taken off.   If the pump alarms go off prior to the pump reading "Low Volume" then call [phone] and someone can assist you.  If the plunger comes out and the chemotherapy medication is leaking out, please use your home chemo spill kit to clean up the spill. Do NOT use paper towels or other household products.  If you have problems or questions regarding your pump, please call either [phone] (24 hours a day) or the cancer center Monday-Friday 8:00 a.m.- 4:30 p.m. at the clinic number and we will assist you. If you are unable to get assistance, then go to the nearest Emergency Department and ask the staff to contact the IV team for assistance.

## 2024-01-14 NOTE — Telephone Encounter (Signed)
 Patient picked up Ozempic  and NovoFine needles

## 2024-01-14 NOTE — Progress Notes (Signed)
 Subjective:  Patient ID: Joshua Nelson, male    DOB: [birth year]  Age: 73 y.o. MRN: [id number]  Chief Complaint  Patient presents with   Medical Management of Chronic Issues    HPI: Diabetes:  Complications: hyperlipidemia, hypertension, and neuropathy  Glucose checking:  patient has Freestyle Libre 2.  Glucose logs: 61-216 Hypoglycemia: once about a week ago.  Most recent A1C: 6.2 Current medications: Farxiga  10 mg take 1 tablet daily, is not taking Lantus  inject 35 units daily.  Patient is taking ozempic  2 mg weekly, Gabapentin  300 mg THREE TIMES A DAY.  Taking fiasp  when feels he needs it. It is very unclear what he is doing. His wife helps with this.  Not taking aspirin. Last Eye Exam: overdue. Foot checks: Daily   Prostate cancer: saw [facility] in mid January. Psa 0.3. active surveillance.    Vertigo hit him a couple of times last week. Meclizine  helps.    Hyperlipidemia: Current medications:  Crestor  40 mg  take 1 tablet daily, Zetia  10 mg take 1 tablet daily.    Hypertensive with heart disease:: Current medications: Metoprolol  25 mg take 1 tablet daily, Lisinopril  5 mg  mg take 1 tablet daily   Diet: Fairly healthy most of the time.   Exercise: some walking.   Rectal Cancer stage IVA: Chemotherapy going well. Tolerating well other than making him tired for 2 days.      10/28/2023   12:38 PM 03/21/2023    8:49 AM 02/13/2023   11:27 AM 07/27/2022   11:21 AM 07/09/2022    2:28 PM  Depression screen PHQ 2/9  Decreased Interest 0 0 2 0 0  Down, Depressed, Hopeless 0 0 0 0 0  PHQ - 2 Score 0 0 2 0 0  Altered sleeping  0 0    Tired, decreased energy  0 1    Change in appetite  0 0    Feeling bad or failure about yourself   0 0    Trouble concentrating  0 0    Moving slowly or fidgety/restless  0 0    Suicidal thoughts  0 0    PHQ-9 Score  0 3    Difficult doing work/chores  Not difficult at all Not difficult at all          09/21/2023    4:55 PM  Fall Risk    Falls in the past year? 0  Number falls in past yr: 0  Injury with Fall? 0  Risk for fall due to : No Fall Risks  Follow up Falls evaluation completed    Patient Care Team: Mercy Stall, MD as PCP - General (Family Medicine) Roxane Copp, MD as Consulting Physician (Urology) Enriqueta Harvey, RN as Case Manager (General Practice) Deloria Fetch, MD as Consulting Physician (Oncology) Verlie Glisson, MD as Consulting Physician (Radiation Oncology)   Review of Systems  Constitutional:  Negative for chills, diaphoresis, fatigue and fever.  HENT:  Negative for congestion, ear pain and sore throat.   Respiratory:  Negative for cough and shortness of breath.   Cardiovascular:  Negative for chest pain and leg swelling.  Gastrointestinal:  Negative for abdominal pain, constipation, diarrhea, nausea and vomiting.  Genitourinary:  Negative for dysuria and urgency.  Musculoskeletal:  Negative for arthralgias and myalgias.  Neurological:  Negative for dizziness and headaches.  Psychiatric/Behavioral:  Negative for dysphoric mood.     Current Outpatient Medications on File Prior to Visit  Medication Sig  Dispense Refill   aspirin EC 81 MG tablet Take 81 mg by mouth daily. Swallow whole.     Continuous Glucose Sensor (FREESTYLE LIBRE 2 SENSOR) MISC Change sensor every 10 days as directed, 2 each 5   ezetimibe  (ZETIA ) 10 MG tablet TAKE ONE (1) TABLET ONCE DAILY 90 tablet 1   FARXIGA  10 MG TABS tablet TAKE 1 TABLET BY MOUTH ONCE DAILY IN THE MORNING AS DIRECTED. 90 tablet 1   gabapentin  (NEURONTIN ) 300 MG capsule Take 1 capsule (300 mg total) by mouth 3 (three) times daily. 270 capsule 1   insulin  aspart (FIASP  FLEXTOUCH) 100 UNIT/ML FlexTouch Pen Inject 16 Units into the skin 2 (two) times daily. With breakfast and supper 30 mL 1   Meclizine  HCl 25 MG CHEW TAKE 1 TABLET THREE TIMES DAILY AS NEEDED FOR DIZZINESS 30 tablet 0   metoprolol  succinate (TOPROL -XL) 25 MG 24 hr tablet TAKE 1 TABLET  BY MOUTH ONCE DAILY IN THE MORNING. 90 tablet 0   ondansetron  (ZOFRAN ) 8 MG tablet Take 1 tablet (8 mg total) by mouth every 8 (eight) hours as needed for nausea or vomiting. 20 tablet 0   prochlorperazine  (COMPAZINE ) 10 MG tablet Take 1 tablet (10 mg total) by mouth every 6 (six) hours as needed for nausea or vomiting. 90 tablet 3   rosuvastatin  (CRESTOR ) 40 MG tablet Take 1 tablet by mouth at bedtime. 90 tablet 0   Semaglutide , 2 MG/DOSE, (OZEMPIC , 2 MG/DOSE,) 8 MG/3ML SOPN Inject 2 mg into the skin once a week. 9 mL 0   traMADol  (ULTRAM ) 50 MG tablet 1-2 tabs po q6h prn pain 50 tablet 0   No current facility-administered medications on file prior to visit.   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   PONV (postoperative nausea and vomiting)    Prostate cancer (HCC) 07/2021   Past Surgical History:  Procedure Laterality Date   CARDIAC CATHETERIZATION     THREE CARDIAC STENTS PLACED   COLONOSCOPY W/ POLYPECTOMY     CYSTOSCOPY WITH URETHRAL DILATATION N/A 08/01/2021   Procedure: CYSTOSCOPY WITH BALLOON URETHRAL DILATATION;  Surgeon: Roxane Copp, MD;  Location: [facility];  Service: Urology;  Laterality: N/A;   left cataract  10/2019   NOSE SURGERY     RESULT OF DOG BITE DURING CHILDHOOD   PROSTATE BIOPSY     TRANSURETHRAL RESECTION OF BLADDER TUMOR N/A 08/01/2021   Procedure: TRANSURETHRAL RESECTION OF PROSTATE;  Surgeon: Roxane Copp, MD;  Location: [facility];  Service: Urology;  Laterality: N/A;  90 MINS   URETHRAL STRICTURE DILATATION      Family History  Problem Relation Age of Onset   Endometrial cancer Mother    CAD Mother    AAA  (abdominal aortic aneurysm) Mother    Dementia Father    Anxiety disorder Father    Depression Father    Heart disease Father    Hypertension Father    Deep vein thrombosis Father    Kidney Stones Sister        H/O : INTESTINAL BLOCKAGE   Barrett's esophagus Sister    Hyperlipidemia Sister  Kidney Stones Brother    Heart attack Brother    Colon cancer Neg Hx    Esophageal cancer Neg Hx    Liver cancer Neg Hx    Stomach cancer Neg Hx    Social History   Socioeconomic History   Marital status: Married    Spouse name: Thersia Flax   Number of children: 1   Years of education: 12   Highest education level: High school graduate  Occupational History   Occupation: RETIRED - TRUCK DRIVER  Tobacco Use   Smoking status: Former    Current packs/day: 0.00    Types: Cigarettes    Quit date: 2003    Years since quitting: 22.4   Smokeless tobacco: Never  Vaping Use   Vaping status: Never Used  Substance and Sexual Activity   Alcohol use: Not Currently    Comment: none since 2000   Drug use: Never   Sexual activity: Not Currently  Other Topics Concern   Not on file  Social History Narrative   Lives at home with his wife.   2 cups caffeine per day.   Right-handed.   Social Drivers of Corporate investment banker Strain: Low Risk  (01/14/2024)   Overall Financial Resource Strain (CARDIA)    Difficulty of Paying Living Expenses: Not hard at all  Food Insecurity: No Food Insecurity (10/28/2023)   Hunger Vital Sign    Worried About Running Out of Food in the Last Year: Never true    Ran Out of Food in the Last Year: Never true  Transportation Needs: No Transportation Needs (10/28/2023)   PRAPARE - Administrator, Civil Service (Medical): No    Lack of Transportation (Non-Medical): No  Physical Activity: Inactive (01/14/2024)   Exercise Vital Sign    Days of Exercise per Week: 0 days    Minutes of Exercise per Session: 0 min  Stress: No Stress Concern Present (01/14/2024)    [employer] of Occupational Health - Occupational Stress Questionnaire    Feeling of Stress : Not at all  Social Connections: Socially Integrated (01/14/2024)   Social Connection and Isolation Panel [NHANES]    Frequency of Communication with Friends and Family: More than three times a week    Frequency of Social Gatherings with Friends and Family: More than three times a week    Attends Religious Services: More than 4 times per year    Active Member of [employer] or Organizations: Yes    Attends Engineer, structural: More than 4 times per year    Marital Status: Married    Objective:  BP 126/72   Pulse 80   Temp 98 F (36.7 C)   Ht 5\' 9"  (1.753 m)   Wt 181 lb (82.1 kg)   SpO2 98%   BMI 26.73 kg/m      01/16/2024    1:26 PM 01/14/2024    2:03 PM 01/14/2024    9:11 AM  BP/Weight  Systolic BP 166 126 130  Diastolic BP 78 72 80  Wt. (Lbs) 184.04 181 181.3  BMI 27.18 kg/m2 26.73 kg/m2 26.77 kg/m2    Physical Exam Vitals reviewed.  Constitutional:      Appearance: Normal appearance.  Neck:     Vascular: No carotid bruit.  Cardiovascular:     Rate and Rhythm: Normal rate and regular rhythm.     Pulses: Normal pulses.     Heart sounds: Normal heart sounds.  Pulmonary:     Effort: Pulmonary  effort is normal.     Breath sounds: Normal breath sounds. No wheezing, rhonchi or rales.  Abdominal:     General: Bowel sounds are normal.     Palpations: Abdomen is soft.     Tenderness: There is no abdominal tenderness.  Neurological:     Mental Status: He is alert and oriented to person, place, and time.  Psychiatric:        Mood and Affect: Mood normal.        Behavior: Behavior normal.     Diabetic Foot Exam - Simple   Simple Foot Form  01/14/2024  9:57 PM  Visual Inspection No deformities, no ulcerations, no other skin breakdown bilaterally: Yes Sensation Testing Intact to touch and monofilament testing bilaterally: Yes Pulse Check Posterior Tibialis and Dorsalis  pulse intact bilaterally: Yes Comments      Lab Results  Component Value Date   WBC 5.0 01/14/2024   HGB 10.0 (L) 01/14/2024   HCT 32.7 (L) 01/14/2024   PLT 125 (L) 01/14/2024   GLUCOSE 134 (H) 01/14/2024   CHOL 133 01/13/2024   TRIG 87 01/13/2024   HDL 42 01/13/2024   LDLCALC 74 01/13/2024   ALT 15 01/14/2024   AST 22 01/14/2024   NA 137 01/14/2024   K 4.3 01/14/2024   CL 102 01/14/2024   CREATININE 0.85 01/14/2024   BUN 11 01/14/2024   CO2 25 01/14/2024   TSH 1.180 09/16/2023   INR 1.1 11/05/2023   HGBA1C 6.2 (H) 01/13/2024   MICROALBUR 150 08/18/2021      Assessment & Plan:  Hypertensive heart disease without heart failure Assessment & Plan: Well controlled.  No changes to medicines.  Metoprolol  25 mg take 1 tablet daily, Lisinopril  5 mg  mg take 1 tablet daily Continue to work on eating a healthy diet and exercise.      Controlled type 2 diabetes mellitus with diabetic polyneuropathy, with long-term current use of insulin  (HCC) -     Gvoke HypoPen  2-Pack; Inject 0.5 mg into the skin daily as needed.  Dispense: 0.2 mL; Refill: 2 -     Microalbumin / creatinine urine ratio -     Hemoglobin A1c; Future  Mixed hyperlipidemia Assessment & Plan: Well controlled.  No changes to medicines. Crestor  40 mg  take 1 tablet daily, Zetia  10 mg take 1 tablet daily.  Continue to work on eating a healthy diet and exercise.     Diabetic polyneuropathy associated with type 2 diabetes mellitus (HCC) Assessment & Plan: Control: Well controlled Recommend check sugars fasting daily. Recommend check feet daily. Recommend annual eye exams. Medicines: Farxiga  10 mg take 1 tablet daily, Ozempic  2 mg weekly, Gabapentin  300 mg THREE TIMES A DAY.  Continue to work on eating a healthy diet and exercise.  Labs reviewed today.  Recommend call [facility] care to schedule your diabetic eye appointment.  Recommend the following vaccines (Tetanus -TDAP) and Shingrix  series.   Coronary artery disease involving native coronary artery of native heart without angina pectoris Assessment & Plan: Stable Recommend baby aspirin 81 mg once daily.  Continue rosuvastatin , zetia , and metoprolol .   Rectal cancer metastasized to liver [facility]) Assessment & Plan: Management per specialist.  The current medical regimen is effective;  continue present plan and medications. Continue chemotherapy.     Vertigo -     Meclizine  HCl; Take 1 tablet (25 mg total) by mouth 3 (three) times daily as needed for dizziness.  Dispense: 60 tablet; Refill: 0  Meds ordered this encounter  Medications   Glucagon  (GVOKE HYPOPEN  2-PACK) 0.5 MG/0.1ML SOAJ    Sig: Inject 0.5 mg into the skin daily as needed.    Dispense:  0.2 mL    Refill:  2   meclizine  (ANTIVERT ) 25 MG tablet    Sig: Take 1 tablet (25 mg total) by mouth 3 (three) times daily as needed for dizziness.    Dispense:  60 tablet    Refill:  0    Orders Placed This Encounter  Procedures   Microalbumin / creatinine urine ratio   Hemoglobin A1c     Follow-up: Return in about 3 months (around 04/15/2024) for chronic follow up.   I,Katherina A Bramblett,acting as a scribe for Mercy Stall, MD.,have documented all relevant documentation on the behalf of Mercy Stall, MD,as directed by  Mercy Stall, MD while in the presence of Mercy Stall, MD.   An After Visit Summary was printed and given to the patient.  Mercy Stall, MD [facility] [phone]

## 2024-01-15 ENCOUNTER — Ambulatory Visit: Payer: Self-pay | Admitting: Family Medicine

## 2024-01-15 ENCOUNTER — Other Ambulatory Visit: Payer: Self-pay

## 2024-01-15 DIAGNOSIS — Z794 Long term (current) use of insulin: Secondary | ICD-10-CM | POA: Diagnosis not present

## 2024-01-15 DIAGNOSIS — E1142 Type 2 diabetes mellitus with diabetic polyneuropathy: Secondary | ICD-10-CM | POA: Diagnosis not present

## 2024-01-16 ENCOUNTER — Other Ambulatory Visit: Payer: Self-pay

## 2024-01-16 ENCOUNTER — Inpatient Hospital Stay

## 2024-01-16 ENCOUNTER — Ambulatory Visit: Payer: Self-pay | Admitting: Family Medicine

## 2024-01-16 VITALS — BP 166/78 | HR 87 | Temp 98.2°F | Resp 20 | Ht 69.0 in | Wt 184.0 lb

## 2024-01-16 DIAGNOSIS — Z5111 Encounter for antineoplastic chemotherapy: Secondary | ICD-10-CM | POA: Diagnosis not present

## 2024-01-16 DIAGNOSIS — C20 Malignant neoplasm of rectum: Secondary | ICD-10-CM

## 2024-01-16 LAB — MICROALBUMIN / CREATININE URINE RATIO
Creatinine, Urine: 55.6 mg/dL
Microalb/Creat Ratio: 36 mg/g{creat} — ABNORMAL HIGH (ref 0–29)
Microalbumin, Urine: 20.2 ug/mL

## 2024-01-16 MED ORDER — LISINOPRIL 10 MG PO TABS: 10.0000 mg | ORAL_TABLET | Freq: Every day | ORAL | 1 refills | Status: DC

## 2024-01-16 MED ORDER — HEPARIN SOD (PORK) LOCK FLUSH 100 UNIT/ML IV SOLN
500.0000 [IU] | Freq: Once | INTRAVENOUS | Status: AC | PRN
  Administered 2024-01-16: 500 [IU]

## 2024-01-16 MED ORDER — SODIUM CHLORIDE 0.9% FLUSH
10.0000 mL | INTRAVENOUS | Status: DC | PRN
  Administered 2024-01-16: 10 mL

## 2024-01-16 NOTE — Patient Instructions (Signed)
 Central Line in Adults: What to Expect A central line is a soft tube called a catheter that is put into a vein in the neck, chest, arm or groin. The tip of the central line ends in a large vein just above the heart called the vena cava. A central line may be used to: Give medicines or fluids through an IV for a long time. Give you nutrients. Take blood or give you blood for testing or treatments. Give chemotherapy or dialysis. Provide IV access if veins in your hands or arms are difficult to use. Types of central lines There are four main types of central lines: Peripherally inserted central catheter (PICC) line. A PICC line goes up the upper arm to the vena cava. This is used for access of one week or longer. It can be used to draw blood, give fluids or medicines. Tunneled central line. It's placed in a large vein in the neck, chest, or groin. It's put in through a small cut is made over the vein, and then it's advanced to the vena cava. This is used for long-term therapy and dialysis. It's tunneled under the skin and brought out through a second cut. Non-tunneled central line. This type is put in the neck, chest or groin. It's usually used for 7 days at the most. It's often used in the emergency department. Implanted port. It's usually put in the upper chest, but it can also be placed in the upper arm or the belly. This is used for long-term therapy. It can stay in place longer than other types of central lines. It's put in and taken out with surgery, and it's accessed using a needle. The type of central line you get depends on how long you will need it, your medical condition, and the condition of your veins. Tell a health care provider about: Any allergies you have. All medicines you take. These include vitamins, herbs, eye drops, and creams. Any problems you or family members have had with anesthesia. Any bleeding problems you have. Any surgeries you've had. Any medical conditions  you have. Whether you're pregnant or may be pregnant. What are the risks? Your health care provider will talk with you about risks. These may include: Infection. A blood clot that blocks the central line or forms in the vein and travels to the heart. Bleeding. Getting a hole or crack in the central line. If this happens, the central line will need to be replaced. The catheter moving or coming out of place. A collapsed lung. Damage to other structures or organs. What happens before the procedure? Medicines Ask about changing or stopping: Any medicines you take. Any vitamins, herbs, or supplements you take. Do not take aspirin or ibuprofen unless you're told to. Surgery safety For your safety, you may: Need to wash your skin with a soap that kills germs. Get antibiotics. Have your procedure site marked. Have hair removed at the procedure site. General instructions Eat and drink as told. Ask if you'll be staying overnight in the hospital. If you'll be going home right after the procedure, plan to have a responsible adult: Drive you home from the hospital or clinic. You won't be allowed to drive. Stay with you for the time you're told. What happens during the procedure? An IV will be put into one of your veins. You may be given: A sedative to help you relax. Anesthesia to keep you from feeling pain. Your skin will be cleaned with a soap that kills germs, and  you may be covered with a clean sheet called a drape. Your blood pressure, heart rate, breathing rate, and blood oxygen level will be checked during the procedure. The central line catheter will be put into the vein and moved through to the correct spot. The provider may use X-rays to make sure the catheter goes to the right place. A bandage will be placed over the insertion area. These steps may vary. Ask what you can expect. What can I expect after the procedure? You will watched closely until you leave. This includes  checking your pain level, blood pressure, heart rate, and breathing rate. Caps may be put on the ends of the central line tubes. If you were given a sedative, do not drive or use machines until you're told it's safe. A sedative can make you sleepy. Follow these instructions at home: Flushing and cleaning the central line  Follow instructions from your provider about flushing and cleaning the central line and the area around it. Only use germ-free, also called sterile, supplies to flush the central line. Use supplies recommended by your provider. Before you flush the central line or clean the central line or the area around it: Wash your hands with soap and water for at least 20 seconds. If you can't use soap and water, use hand sanitizer. Clean the central line hub with rubbing alcohol. To do this: Clean the central line hub with a new alcohol wipe. Scrub it using a twisting motion and rub for 10 to 15 seconds or for 30 twists. Follow the manufacturer's instructions. Be sure you scrub the top of the hub, not just the sides. Let the hub dry before use. Prevent it from touching anything while drying. Do not re-use alcohol pads. Caring for your skin Check your central line site every day for signs of infection. Check for: Redness, swelling, or pain. Fluid or blood. Warmth. Pus or a bad smell. Keep the insertion site of your central line clean and dry at all times. Change your bandage only as told by your provider. Keep your bandage dry. If it gets wet, have it changed as soon as possible. Storage and throwing away supplies Keep your supplies in a clean, dry location. Throw away any used syringes in a disposal container that is meant for sharp items, called a sharps container. You can buy a sharps container from a pharmacy, or you can make one by using an empty hard plastic bottle with a cover. Place any used bandages or infusion bags into a plastic bag. Throw that bag in the trash. General  instructions Follow instructions from your provider for the type of device that you have. Keep the tube clamped unless it's being used. If you or someone else accidentally pulls on the tube, make sure: The bandage is OK. There is no bleeding. The tube has not been pulled out. Do not use scissors or sharp objects near the tube. Do not take baths, swim, or use a hot tub until you're told it's OK. Ask if you can shower. Ask what things are safe for you to do. Your provider may tell you not to lift anything or move your arm too much on the side of your central line. Contact a health care provider if: You have signs of an infection where the tube was put in. The skin is irritated near the bandage. You catheter appears to be getting longer. This may mean it's coming out of the vein. Get help right away if: You have:  A fever or chills. Shortness of breath. Pain in your chest. A fast heartbeat. Swelling in your neck, face, chest, or arm on the side of your central line. You feel dizzy or you faint. Your cut or central line site has red streaks spreading away from the area. Your cut or central line site is bleeding and won't stop. Your central line is hard to flush or will not flush or you do not get a blood return. Your central line gets loose, damaged or comes out. Your catheter leaks when flushed or when fluids are infused into it. This information is not intended to replace advice given to you by your health care provider. Make sure you discuss any questions you have with your health care provider. Document Revised: 02/25/2023 Document Reviewed: 02/25/2023 Elsevier Patient Education  2024 [employer].

## 2024-01-17 DIAGNOSIS — C20 Malignant neoplasm of rectum: Secondary | ICD-10-CM | POA: Diagnosis not present

## 2024-01-19 DIAGNOSIS — R42 Dizziness and giddiness: Secondary | ICD-10-CM | POA: Insufficient documentation

## 2024-01-19 NOTE — Assessment & Plan Note (Signed)
 Management per specialist.  The current medical regimen is effective;  continue present plan and medications. Continue chemotherapy.

## 2024-01-19 NOTE — Assessment & Plan Note (Signed)
 Stable Recommend baby aspirin 81 mg once daily.  Continue rosuvastatin , zetia , and metoprolol .

## 2024-01-19 NOTE — Assessment & Plan Note (Signed)
Well controlled.  No changes to medicines. Crestor 40 mg  take 1 tablet daily, Zetia 10 mg take 1 tablet daily.  Continue to work on eating a healthy diet and exercise.

## 2024-01-19 NOTE — Assessment & Plan Note (Signed)
Well controlled.  No changes to medicines.  Metoprolol 25 mg take 1 tablet daily, Lisinopril 5 mg  mg take 1 tablet daily Continue to work on eating a healthy diet and exercise.

## 2024-01-19 NOTE — Assessment & Plan Note (Signed)
 Control: Well controlled Recommend check sugars fasting daily. Recommend check feet daily. Recommend annual eye exams. Medicines: Farxiga  10 mg take 1 tablet daily, Ozempic  2 mg weekly, Gabapentin  300 mg THREE TIMES A DAY.  Continue to work on eating a healthy diet and exercise.  Labs reviewed today.  Recommend call [facility] care to schedule your diabetic eye appointment.  Recommend the following vaccines (Tetanus -TDAP) and Shingrix series.

## 2024-01-21 ENCOUNTER — Telehealth: Payer: Self-pay

## 2024-01-21 NOTE — Telephone Encounter (Signed)
 Spoke with patient's wife and let her know Fiaso Flextouch (2 boxes) ready for pick up.

## 2024-01-27 NOTE — Progress Notes (Signed)
 [facility]  [address] [place],  [place]  [address] [phone]  Clinic Day:  01/28/2024  Referring physician: Mercy Stall, MD   HISTORY OF PRESENT ILLNESS:  The patient is a 73 y.o. 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  He comes in today to be evaluated before heading into his 4th cycle of FOLFOX/Avastin .  The patient claims to have tolerated his 3rd cycle of palliative chemotherapy fairly well.  However, he has had slightly more nausea with successive cycles of treatment to where he has needed his ondansetron  more than previously.  He denies having any further rectal bleeding since his palliative radiation and chemotherapy started.  He  denies having any new symptoms which concern him for disease progression while on chemotherapy.  PHYSICAL EXAM:  Blood pressure 130/75, pulse 84, temperature 98.6 F (37 C), temperature source Oral, resp. rate 14, height 5' 9 (1.753 m), weight 177 lb 12.8 oz (80.6 kg), SpO2 100%. Wt Readings from Last 3 Encounters:  01/28/24 177 lb 12.8 oz (80.6 kg)  01/16/24 184 lb 0.6 oz (83.5 kg)  01/14/24 181 lb (82.1 kg)   Body mass index is 26.26 kg/m. Performance status (ECOG): 0 - Asymptomatic Physical Exam Constitutional:      Appearance: Normal appearance. He is not ill-appearing.  HENT:     Mouth/Throat:     Mouth: Mucous membranes are moist.     Pharynx: Oropharynx is clear. No oropharyngeal exudate or posterior oropharyngeal erythema.   Cardiovascular:     Rate and Rhythm: Normal rate and regular rhythm.     Heart sounds: No murmur heard.    No friction rub. No gallop.  Pulmonary:     Effort: Pulmonary effort is normal. No respiratory distress.     Breath sounds: Normal breath sounds. No wheezing, rhonchi or rales.  Abdominal:     General: Bowel sounds are normal. There is no distension.     Palpations: Abdomen is soft. There is no mass.     Tenderness: There is  no abdominal tenderness.   Musculoskeletal:        General: No swelling.     Right lower leg: No edema.     Left lower leg: No edema.  Lymphadenopathy:     Cervical: No cervical adenopathy.     Upper Body:     Right upper body: No supraclavicular or axillary adenopathy.     Left upper body: No supraclavicular or axillary adenopathy.     Lower Body: No right inguinal adenopathy. No left inguinal adenopathy.   Skin:    General: Skin is warm.     Coloration: Skin is not jaundiced.     Findings: No lesion or rash.   Neurological:     General: No focal deficit present.     Mental Status: He is alert and oriented to person, place, and time. Mental status is at baseline.   Psychiatric:        Mood and Affect: Mood normal.        Behavior: Behavior normal.        Thought Content: Thought content normal.    LABS:      Latest Ref Rng & Units 01/28/2024    9:11 AM 01/14/2024    8:48 AM 01/13/2024    8:15 AM  CBC  WBC 4.0 - 10.5 K/uL 5.4  5.0  5.4   Hemoglobin 13.0 - 17.0 g/dL 9.9  46.9  62.9  Hematocrit 39.0 - 52.0 % 32.5  32.7  35.4   Platelets 150 - 400 K/uL 103  125  122       Latest Ref Rng & Units 01/28/2024    9:11 AM 01/14/2024    8:48 AM 01/13/2024    8:15 AM  CMP  Glucose 70 - 99 mg/dL 147  829  562   BUN 8 - 23 mg/dL 17  11  12    Creatinine 0.61 - 1.24 mg/dL 1.30  8.65  7.84   Sodium 135 - 145 mmol/L 136  137  141   Potassium 3.5 - 5.1 mmol/L 4.2  4.3  4.8   Chloride 98 - 111 mmol/L 104  102  104   CO2 22 - 32 mmol/L 24  25  22    Calcium  8.9 - 10.3 mg/dL 9.6  9.4  9.5   Total Protein 6.5 - 8.1 g/dL 6.6  6.8  6.9   Total Bilirubin 0.0 - 1.2 mg/dL 0.5  0.6  0.6   Alkaline Phos 38 - 126 U/L 61  66  72   AST 15 - 41 U/L 23  22  24    ALT 0 - 44 U/L 18  15  18     Lab Results  Component Value Date   CEA 13.98 (H) 10/28/2023    Latest Reference Range & Units 01/28/24 09:11  Iron 45 - 182 ug/dL 51  UIBC ug/dL 696  TIBC 295 - 284 ug/dL 132  Saturation Ratios 17.9 -  39.5 % 13 (L)  Ferritin 24 - 336 ng/mL 39  (L): Data is abnormally low  PATHOLOGY:   His ultrasound-guided biopsy revealed the following: LIVER, MASS, CORE BIOPSY  Metastatic moderately differentiated adenocarcinoma cytohistologically  consistent with rectal primary  ---------------------------------------------------------------------------------- The pathologic results from his colonoscopy reveled the following: FINAL DIAGNOSIS        1. Colon, polyp(s), transverse, ascending, and cecum (6) :       - TUBULAR ADENOMA(S)       - NEGATIVE FOR HIGH-GRADE DYSPLASIA OR MALIGNANCY        2. Sigmoid  Colon Polyp, (2) :       - TUBULAR ADENOMA(S)       - NEGATIVE FOR HIGH-GRADE DYSPLASIA OR MALIGNANCY        3. Rectum, biopsy,  :       - AT LEAST INTRAMUCOSAL ADENOCARCINOMA, SEE NOTE        Diagnosis Note : Depth of invasion cannot be judged due to the superficial       nature of the biopsies.   ASSESSMENT & PLAN:  A 72 y.o. male with stage IVA (T3 N2 M1a) rectal cancer.  He will proceed with his fourth cycle of FOLFOX/Avastin  today.  Clinically, he is doing very fairly well.  Due to his increased nausea, his ondansetron  prescription will be refilled.  A prescription was also written for him to take olanzapine 5 mg at night to assist with his chemotherapy-induced nausea.  Physically, the patient looks fine.  I will see him back in 2 weeks before he heads into his fifth cycle of FOLFOX/Avastin .  An abdominal MRI will be done before his next visit to ascertain his new disease baseline after 4 cycles of FOLFOX/Avastin .  The patient understands all the plans discussed today and is in agreement with them.  Daden Mahany Felicia Horde, MD

## 2024-01-28 ENCOUNTER — Inpatient Hospital Stay

## 2024-01-28 ENCOUNTER — Other Ambulatory Visit: Payer: Self-pay

## 2024-01-28 ENCOUNTER — Telehealth: Payer: Self-pay | Admitting: Oncology

## 2024-01-28 ENCOUNTER — Inpatient Hospital Stay: Admitting: Oncology

## 2024-01-28 ENCOUNTER — Telehealth: Payer: Self-pay | Admitting: Dietician

## 2024-01-28 ENCOUNTER — Other Ambulatory Visit: Payer: Self-pay | Admitting: Oncology

## 2024-01-28 VITALS — BP 130/75 | HR 84 | Temp 98.6°F | Resp 14 | Ht 69.0 in | Wt 177.8 lb

## 2024-01-28 DIAGNOSIS — C20 Malignant neoplasm of rectum: Secondary | ICD-10-CM

## 2024-01-28 DIAGNOSIS — C787 Secondary malignant neoplasm of liver and intrahepatic bile duct: Secondary | ICD-10-CM

## 2024-01-28 DIAGNOSIS — Z5111 Encounter for antineoplastic chemotherapy: Secondary | ICD-10-CM | POA: Diagnosis not present

## 2024-01-28 LAB — CMP (CANCER CENTER ONLY)
ALT: 18 U/L (ref 0–44)
AST: 23 U/L (ref 15–41)
Albumin: 4.3 g/dL (ref 3.5–5.0)
Alkaline Phosphatase: 61 U/L (ref 38–126)
Anion gap: 8 (ref 5–15)
BUN: 17 mg/dL (ref 8–23)
CO2: 24 mmol/L (ref 22–32)
Calcium: 9.6 mg/dL (ref 8.9–10.3)
Chloride: 104 mmol/L (ref 98–111)
Creatinine: 0.88 mg/dL (ref 0.61–1.24)
GFR, Estimated: >60 mL/min (ref >60)
Glucose, Bld: 124 mg/dL — ABNORMAL HIGH (ref 70–99)
Potassium: 4.2 mmol/L (ref 3.5–5.1)
Sodium: 136 mmol/L (ref 135–145)
Total Bilirubin: 0.5 mg/dL (ref 0.0–1.2)
Total Protein: 6.6 g/dL (ref 6.5–8.1)

## 2024-01-28 LAB — IRON AND TIBC
Iron: 51 ug/dL (ref 45–182)
Saturation Ratios: 13 % — ABNORMAL LOW (ref 17.9–39.5)
TIBC: 403 ug/dL (ref 250–450)
UIBC: 352 ug/dL

## 2024-01-28 LAB — CBC WITH DIFFERENTIAL (CANCER CENTER ONLY)
Abs Immature Granulocytes: 0.01 10*3/uL (ref 0.00–0.07)
Basophils Absolute: 0 10*3/uL (ref 0.0–0.1)
Basophils Relative: 1 %
Eosinophils Absolute: 0.1 10*3/uL (ref 0.0–0.5)
Eosinophils Relative: 2 %
HCT: 32.5 % — ABNORMAL LOW (ref 39.0–52.0)
Hemoglobin: 9.9 g/dL — ABNORMAL LOW (ref 13.0–17.0)
Immature Granulocytes: 0 %
Lymphocytes Relative: 19 %
Lymphs Abs: 1.1 10*3/uL (ref 0.7–4.0)
MCH: 24.8 pg — ABNORMAL LOW (ref 26.0–34.0)
MCHC: 30.5 g/dL (ref 30.0–36.0)
MCV: 81.3 fL (ref 80.0–100.0)
Monocytes Absolute: 0.6 10*3/uL (ref 0.1–1.0)
Monocytes Relative: 10 %
Neutro Abs: 3.7 10*3/uL (ref 1.7–7.7)
Neutrophils Relative %: 68 %
Platelet Count: 103 10*3/uL — ABNORMAL LOW (ref 150–400)
RBC: 4 MIL/uL — ABNORMAL LOW (ref 4.22–5.81)
RDW: 18.5 % — ABNORMAL HIGH (ref 11.5–15.5)
WBC Count: 5.4 10*3/uL (ref 4.0–10.5)
nRBC: 0 % (ref 0.0–0.2)

## 2024-01-28 LAB — FERRITIN: Ferritin: 39 ng/mL (ref 24–336)

## 2024-01-28 MED ORDER — ONDANSETRON HCL 8 MG PO TABS
8.0000 mg | ORAL_TABLET | Freq: Three times a day (TID) | ORAL | 1 refills | Status: DC | PRN
Start: 2024-01-28 — End: 2024-05-12

## 2024-01-28 MED ORDER — SODIUM CHLORIDE 0.9 % IV SOLN
2400.0000 mg/m2 | INTRAVENOUS | Status: DC
  Administered 2024-01-28: 5000 mg via INTRAVENOUS
  Filled 2024-01-28: qty 100

## 2024-01-28 MED ORDER — OXALIPLATIN CHEMO INJECTION 100 MG/20ML
85.0000 mg/m2 | Freq: Once | INTRAVENOUS | Status: AC
  Administered 2024-01-28: 175 mg via INTRAVENOUS
  Filled 2024-01-28: qty 20

## 2024-01-28 MED ORDER — OLANZAPINE 5 MG PO TABS: ORAL_TABLET | ORAL | 1 refills | Status: AC

## 2024-01-28 MED ORDER — PALONOSETRON HCL INJECTION 0.25 MG/5ML
0.2500 mg | Freq: Once | INTRAVENOUS | Status: AC
  Administered 2024-01-28: 0.25 mg via INTRAVENOUS
  Filled 2024-01-28: qty 5

## 2024-01-28 MED ORDER — FLUOROURACIL CHEMO INJECTION 2.5 GM/50ML
400.0000 mg/m2 | Freq: Once | INTRAVENOUS | Status: AC
  Administered 2024-01-28: 800 mg via INTRAVENOUS
  Filled 2024-01-28: qty 16

## 2024-01-28 MED ORDER — DEXAMETHASONE SODIUM PHOSPHATE 10 MG/ML IJ SOLN
10.0000 mg | Freq: Once | INTRAMUSCULAR | Status: AC
  Administered 2024-01-28: 10 mg via INTRAVENOUS
  Filled 2024-01-28: qty 1

## 2024-01-28 MED ORDER — SODIUM CHLORIDE 0.9 % IV SOLN
5.0000 mg/kg | Freq: Once | INTRAVENOUS | Status: AC
  Administered 2024-01-28: 400 mg via INTRAVENOUS
  Filled 2024-01-28: qty 16

## 2024-01-28 MED ORDER — DEXTROSE 5 % IV SOLN: INTRAVENOUS | Status: DC

## 2024-01-28 MED ORDER — SODIUM CHLORIDE 0.9 % IV SOLN
INTRAVENOUS | Status: DC
Start: 2024-01-28 — End: 2024-01-28

## 2024-01-28 MED ORDER — LEUCOVORIN CALCIUM INJECTION 350 MG
400.0000 mg/m2 | Freq: Once | INTRAVENOUS | Status: AC
  Administered 2024-01-28: 816 mg via INTRAVENOUS
  Filled 2024-01-28: qty 40.8

## 2024-01-28 NOTE — Telephone Encounter (Signed)
 Patient has been scheduled for follow-up visit per 01/28/24 LOS.  Pt given an appt calendar with date and time.

## 2024-01-28 NOTE — Patient Instructions (Signed)
 Fluorouracil Injection What is this medication? FLUOROURACIL (flure oh YOOR a sil) treats some types of cancer. It works by slowing down the growth of cancer cells. This medicine may be used for other purposes; ask your health care provider or pharmacist if you have questions. COMMON BRAND NAME(S): Adrucil What should I tell my care team before I take this medication? They need to know if you have any of these conditions: Blood disorders Dihydropyrimidine dehydrogenase (DPD) deficiency Infection, such as chickenpox, cold sores, herpes Kidney disease Liver disease Poor nutrition Recent or ongoing radiation therapy An unusual or allergic reaction to fluorouracil, other medications, foods, dyes, or preservatives If you or your partner are pregnant or trying to get pregnant Breast-feeding How should I use this medication? This medication is injected into a vein. It is administered by your care team in a hospital or clinic setting. Talk to your care team about the use of this medication in children. Special care may be needed. Overdosage: If you think you have taken too much of this medicine contact a poison control center or emergency room at once. NOTE: This medicine is only for you. Do not share this medicine with others. What if I miss a dose? Keep appointments for follow-up doses. It is important not to miss your dose. Call your care team if you are unable to keep an appointment. What may interact with this medication? Do not take this medication with any of the following: Live virus vaccines This medication may also interact with the following: Medications that treat or prevent blood clots, such as warfarin, enoxaparin, dalteparin This list may not describe all possible interactions. Give your health care provider a list of all the medicines, herbs, non-prescription drugs, or dietary supplements you use. Also tell them if you smoke, drink alcohol, or use illegal drugs. Some items may  interact with your medicine. What should I watch for while using this medication? Your condition will be monitored carefully while you are receiving this medication. This medication may make you feel generally unwell. This is not uncommon as chemotherapy can affect healthy cells as well as cancer cells. Report any side effects. Continue your course of treatment even though you feel ill unless your care team tells you to stop. In some cases, you may be given additional medications to help with side effects. Follow all directions for their use. This medication may increase your risk of getting an infection. Call your care team for advice if you get a fever, chills, sore throat, or other symptoms of a cold or flu. Do not treat yourself. Try to avoid being around people who are sick. This medication may increase your risk to bruise or bleed. Call your care team if you notice any unusual bleeding. Be careful brushing or flossing your teeth or using a toothpick because you may get an infection or bleed more easily. If you have any dental work done, tell your dentist you are receiving this medication. Avoid taking medications that contain aspirin, acetaminophen, ibuprofen, naproxen, or ketoprofen unless instructed by your care team. These medications may hide a fever. Do not treat diarrhea with over the counter products. Contact your care team if you have diarrhea that lasts more than 2 days or if it is severe and watery. This medication can make you more sensitive to the sun. Keep out of the sun. If you cannot avoid being in the sun, wear protective clothing and sunscreen. Do not use sun lamps, tanning beds, or tanning booths. Talk to  your care team if you or your partner wish to become pregnant or think you might be pregnant. This medication can cause serious birth defects if taken during pregnancy and for 3 months after the last dose. A reliable form of contraception is recommended while taking this  medication and for 3 months after the last dose. Talk to your care team about effective forms of contraception. Do not father a child while taking this medication and for 3 months after the last dose. Use a condom while having sex during this time period. Do not breastfeed while taking this medication. This medication may cause infertility. Talk to your care team if you are concerned about your fertility. What side effects may I notice from receiving this medication? Side effects that you should report to your care team as soon as possible: Allergic reactions--skin rash, itching, hives, swelling of the face, lips, tongue, or throat Heart attack--pain or tightness in the chest, shoulders, arms, or jaw, nausea, shortness of breath, cold or clammy skin, feeling faint or lightheaded Heart failure--shortness of breath, swelling of the ankles, feet, or hands, sudden weight gain, unusual weakness or fatigue Heart rhythm changes--fast or irregular heartbeat, dizziness, feeling faint or lightheaded, chest pain, trouble breathing High ammonia level--unusual weakness or fatigue, confusion, loss of appetite, nausea, vomiting, seizures Infection--fever, chills, cough, sore throat, wounds that don't heal, pain or trouble when passing urine, general feeling of discomfort or being unwell Low red blood cell level--unusual weakness or fatigue, dizziness, headache, trouble breathing Pain, tingling, or numbness in the hands or feet, muscle weakness, change in vision, confusion or trouble speaking, loss of balance or coordination, trouble walking, seizures Redness, swelling, and blistering of the skin over hands and feet Severe or prolonged diarrhea Unusual bruising or bleeding Side effects that usually do not require medical attention (report to your care team if they continue or are bothersome): Dry skin Headache Increased tears Nausea Pain, redness, or swelling with sores inside the mouth or throat Sensitivity  to light Vomiting This list may not describe all possible side effects. Call your doctor for medical advice about side effects. You may report side effects to FDA at 1-800-FDA-1088. Where should I keep my medication? This medication is given in a hospital or clinic. It will not be stored at home. NOTE: This sheet is a summary. It may not cover all possible information. If you have questions about this medicine, talk to your doctor, pharmacist, or health care provider.  2024 Elsevier/Gold Standard (2021-12-05 00:00:00)Leucovorin Injection What is this medication? LEUCOVORIN (loo koe VOR in) prevents side effects from certain medications, such as methotrexate. It works by increasing folate levels. This helps protect healthy cells in your body. It may also be used to treat anemia caused by low levels of folate. It can also be used with fluorouracil, a type of chemotherapy, to treat colorectal cancer. It works by increasing the effects of fluorouracil in the body. This medicine may be used for other purposes; ask your health care provider or pharmacist if you have questions. What should I tell my care team before I take this medication? They need to know if you have any of these conditions: Anemia from low levels of vitamin B12 in the blood An unusual or allergic reaction to leucovorin, folic acid, other medications, foods, dyes, or preservatives Pregnant or trying to get pregnant Breastfeeding How should I use this medication? This medication is injected into a vein or a muscle. It is given by your care team in  a hospital or clinic setting. Talk to your care team about the use of this medication in children. Special care may be needed. Overdosage: If you think you have taken too much of this medicine contact a poison control center or emergency room at once. NOTE: This medicine is only for you. Do not share this medicine with others. What if I miss a dose? Keep appointments for follow-up doses.  It is important not to miss your dose. Call your care team if you are unable to keep an appointment. What may interact with this medication? Capecitabine Fluorouracil Phenobarbital Phenytoin Primidone Trimethoprim;sulfamethoxazole This list may not describe all possible interactions. Give your health care provider a list of all the medicines, herbs, non-prescription drugs, or dietary supplements you use. Also tell them if you smoke, drink alcohol, or use illegal drugs. Some items may interact with your medicine. What should I watch for while using this medication? Your condition will be monitored carefully while you are receiving this medication. This medication may increase the side effects of 5-fluorouracil. Tell your care team if you have diarrhea or mouth sores that do not get better or that get worse. What side effects may I notice from receiving this medication? Side effects that you should report to your care team as soon as possible: Allergic reactions--skin rash, itching, hives, swelling of the face, lips, tongue, or throat This list may not describe all possible side effects. Call your doctor for medical advice about side effects. You may report side effects to FDA at 1-800-FDA-1088. Where should I keep my medication? This medication is given in a hospital or clinic. It will not be stored at home. NOTE: This sheet is a summary. It may not cover all possible information. If you have questions about this medicine, talk to your doctor, pharmacist, or health care provider.  2024 Elsevier/Gold Standard (2022-01-02 00:00:00)Oxaliplatin Injection What is this medication? OXALIPLATIN (ox AL i PLA tin) treats colorectal cancer. It works by slowing down the growth of cancer cells. This medicine may be used for other purposes; ask your health care provider or pharmacist if you have questions. COMMON BRAND NAME(S): Eloxatin What should I tell my care team before I take this medication? They  need to know if you have any of these conditions: Heart disease History of irregular heartbeat or rhythm Liver disease Low blood cell levels (white cells, red cells, and platelets) Lung or breathing disease, such as asthma Take medications that treat or prevent blood clots Tingling of the fingers, toes, or other nerve disorder An unusual or allergic reaction to oxaliplatin, other medications, foods, dyes, or preservatives If you or your partner are pregnant or trying to get pregnant Breast-feeding How should I use this medication? This medication is injected into a vein. It is given by your care team in a hospital or clinic setting. Talk to your care team about the use of this medication in children. Special care may be needed. Overdosage: If you think you have taken too much of this medicine contact a poison control center or emergency room at once. NOTE: This medicine is only for you. Do not share this medicine with others. What if I miss a dose? Keep appointments for follow-up doses. It is important not to miss a dose. Call your care team if you are unable to keep an appointment. What may interact with this medication? Do not take this medication with any of the following: Cisapride Dronedarone Pimozide Thioridazine This medication may also interact with the following:  Aspirin and aspirin-like medications Certain medications that treat or prevent blood clots, such as warfarin, apixaban, dabigatran, and rivaroxaban Cisplatin Cyclosporine Diuretics Medications for infection, such as acyclovir, adefovir, amphotericin B, bacitracin, cidofovir, foscarnet, ganciclovir, gentamicin, pentamidine, vancomycin NSAIDs, medications for pain and inflammation, such as ibuprofen or naproxen Other medications that cause heart rhythm changes Pamidronate Zoledronic acid This list may not describe all possible interactions. Give your health care provider a list of all the medicines, herbs,  non-prescription drugs, or dietary supplements you use. Also tell them if you smoke, drink alcohol, or use illegal drugs. Some items may interact with your medicine. What should I watch for while using this medication? Your condition will be monitored carefully while you are receiving this medication. You may need blood work while taking this medication. This medication may make you feel generally unwell. This is not uncommon as chemotherapy can affect healthy cells as well as cancer cells. Report any side effects. Continue your course of treatment even though you feel ill unless your care team tells you to stop. This medication may increase your risk of getting an infection. Call your care team for advice if you get a fever, chills, sore throat, or other symptoms of a cold or flu. Do not treat yourself. Try to avoid being around people who are sick. Avoid taking medications that contain aspirin, acetaminophen, ibuprofen, naproxen, or ketoprofen unless instructed by your care team. These medications may hide a fever. Be careful brushing or flossing your teeth or using a toothpick because you may get an infection or bleed more easily. If you have any dental work done, tell your dentist you are receiving this medication. This medication can make you more sensitive to cold. Do not drink cold drinks or use ice. Cover exposed skin before coming in contact with cold temperatures or cold objects. When out in cold weather wear warm clothing and cover your mouth and nose to warm the air that goes into your lungs. Tell your care team if you get sensitive to the cold. Talk to your care team if you or your partner are pregnant or think either of you might be pregnant. This medication can cause serious birth defects if taken during pregnancy and for 9 months after the last dose. A negative pregnancy test is required before starting this medication. A reliable form of contraception is recommended while taking this  medication and for 9 months after the last dose. Talk to your care team about effective forms of contraception. Do not father a child while taking this medication and for 6 months after the last dose. Use a condom while having sex during this time period. Do not breastfeed while taking this medication and for 3 months after the last dose. This medication may cause infertility. Talk to your care team if you are concerned about your fertility. What side effects may I notice from receiving this medication? Side effects that you should report to your care team as soon as possible: Allergic reactions--skin rash, itching, hives, swelling of the face, lips, tongue, or throat Bleeding--bloody or black, tar-like stools, vomiting blood or brown material that looks like coffee grounds, red or dark brown urine, small red or purple spots on skin, unusual bruising or bleeding Dry cough, shortness of breath or trouble breathing Heart rhythm changes--fast or irregular heartbeat, dizziness, feeling faint or lightheaded, chest pain, trouble breathing Infection--fever, chills, cough, sore throat, wounds that don't heal, pain or trouble when passing urine, general feeling of discomfort or being unwell  Liver injury--right upper belly pain, loss of appetite, nausea, light-colored stool, dark yellow or brown urine, yellowing skin or eyes, unusual weakness or fatigue Low red blood cell level--unusual weakness or fatigue, dizziness, headache, trouble breathing Muscle injury--unusual weakness or fatigue, muscle pain, dark yellow or brown urine, decrease in amount of urine Pain, tingling, or numbness in the hands or feet Sudden and severe headache, confusion, change in vision, seizures, which may be signs of posterior reversible encephalopathy syndrome (PRES) Unusual bruising or bleeding Side effects that usually do not require medical attention (report to your care team if they continue or are  bothersome): Diarrhea Nausea Pain, redness, or swelling with sores inside the mouth or throat Unusual weakness or fatigue Vomiting This list may not describe all possible side effects. Call your doctor for medical advice about side effects. You may report side effects to FDA at 1-800-FDA-1088. Where should I keep my medication? This medication is given in a hospital or clinic. It will not be stored at home. NOTE: This sheet is a summary. It may not cover all possible information. If you have questions about this medicine, talk to your doctor, pharmacist, or health care provider.  2024 Elsevier/Gold Standard (2023-07-12 00:00:00)Bevacizumab Injection What is this medication? BEVACIZUMAB (be va SIZ yoo mab) treats some types of cancer. It works by blocking a protein that causes cancer cells to grow and multiply. This helps to slow or stop the spread of cancer cells. It is a monoclonal antibody. This medicine may be used for other purposes; ask your health care provider or pharmacist if you have questions. COMMON BRAND NAME(S): Alymsys, Avastin, MVASI, [place] Lao What should I tell my care team before I take this medication? They need to know if you have any of these conditions: Blood clots Coughing up blood Having or recent surgery Heart failure High blood pressure History of a connection between 2 or more body parts that do not usually connect (fistula) History of a tear in your stomach or intestines Protein in your urine An unusual or allergic reaction to bevacizumab, other medications, foods, dyes, or preservatives Pregnant or trying to get pregnant Breast-feeding How should I use this medication? This medication is injected into a vein. It is given by your care team in a hospital or clinic setting. Talk to your care team the use of this medication in children. Special care may be needed. Overdosage: If you think you have taken too much of this medicine contact a poison  control center or emergency room at once. NOTE: This medicine is only for you. Do not share this medicine with others. What if I miss a dose? Keep appointments for follow-up doses. It is important not to miss your dose. Call your care team if you are unable to keep an appointment. What may interact with this medication? Interactions are not expected. This list may not describe all possible interactions. Give your health care provider a list of all the medicines, herbs, non-prescription drugs, or dietary supplements you use. Also tell them if you smoke, drink alcohol, or use illegal drugs. Some items may interact with your medicine. What should I watch for while using this medication? Your condition will be monitored carefully while you are receiving this medication. You may need blood work while taking this medication. This medication may make you feel generally unwell. This is not uncommon as chemotherapy can affect healthy cells as well as cancer cells. Report any side effects. Continue your course of treatment even though you feel  ill unless your care team tells you to stop. This medication may increase your risk to bruise or bleed. Call your care team if you notice any unusual bleeding. Before having surgery, talk to your care team to make sure it is ok. This medication can increase the risk of poor healing of your surgical site or wound. You will need to stop this medication for 28 days before surgery. After surgery, wait at least 28 days before restarting this medication. Make sure the surgical site or wound is healed enough before restarting this medication. Talk to your care team if questions. Talk to your care team if you may be pregnant. Serious birth defects can occur if you take this medication during pregnancy and for 6 months after the last dose. Contraception is recommended while taking this medication and for 6 months after the last dose. Your care team can help you find the option that  works for you. Do not breastfeed while taking this medication and for 6 months after the last dose. This medication can cause infertility. Talk to your care team if you are concerned about your fertility. What side effects may I notice from receiving this medication? Side effects that you should report to your care team as soon as possible: Allergic reactions--skin rash, itching, hives, swelling of the face, lips, tongue, or throat Bleeding--bloody or black, tar-like stools, vomiting blood or brown material that looks like coffee grounds, red or dark brown urine, small red or purple spots on skin, unusual bruising or bleeding Blood clot--pain, swelling, or warmth in the leg, shortness of breath, chest pain Heart attack--pain or tightness in the chest, shoulders, arms, or jaw, nausea, shortness of breath, cold or clammy skin, feeling faint or lightheaded Heart failure--shortness of breath, swelling of the ankles, feet, or hands, sudden weight gain, unusual weakness or fatigue Increase in blood pressure Infection--fever, chills, cough, sore throat, wounds that don't heal, pain or trouble when passing urine, general feeling of discomfort or being unwell Infusion reactions--chest pain, shortness of breath or trouble breathing, feeling faint or lightheaded Kidney injury--decrease in the amount of urine, swelling of the ankles, hands, or feet Stomach pain that is severe, does not go away, or gets worse Stroke--sudden numbness or weakness of the face, arm, or leg, trouble speaking, confusion, trouble walking, loss of balance or coordination, dizziness, severe headache, change in vision Sudden and severe headache, confusion, change in vision, seizures, which may be signs of posterior reversible encephalopathy syndrome (PRES) Side effects that usually do not require medical attention (report to your care team if they continue or are bothersome): Back pain Change in taste Diarrhea Dry skin Increased  tears Nosebleed This list may not describe all possible side effects. Call your doctor for medical advice about side effects. You may report side effects to FDA at 1-800-FDA-1088. Where should I keep my medication? This medication is given in a hospital or clinic. It will not be stored at home. NOTE: This sheet is a summary. It may not cover all possible information. If you have questions about this medicine, talk to your doctor, pharmacist, or health care provider.  2024 Elsevier/Gold Standard (2021-12-15 00:00:00)

## 2024-01-28 NOTE — Telephone Encounter (Signed)
 Patient screened on MST. First attempt to reach. Provided my cell# on voice mail to return call to set up a nutrition consult.  Gennaro Africa, RDN, LDN Registered Dietitian, [facility] Part Time Remote (Usual office hours: Tuesday-Thursday) Cell: [phone]

## 2024-01-30 ENCOUNTER — Inpatient Hospital Stay

## 2024-01-30 VITALS — BP 124/73 | HR 84 | Temp 98.0°F | Resp 18

## 2024-01-30 DIAGNOSIS — C20 Malignant neoplasm of rectum: Secondary | ICD-10-CM

## 2024-01-30 DIAGNOSIS — Z5111 Encounter for antineoplastic chemotherapy: Secondary | ICD-10-CM | POA: Diagnosis not present

## 2024-01-30 MED ORDER — SODIUM CHLORIDE 0.9% FLUSH
10.0000 mL | INTRAVENOUS | Status: DC | PRN
Start: 2024-01-30 — End: 2024-01-30
  Administered 2024-01-30: 10 mL

## 2024-01-30 MED ORDER — HEPARIN SOD (PORK) LOCK FLUSH 100 UNIT/ML IV SOLN
500.0000 [IU] | Freq: Once | INTRAVENOUS | Status: AC | PRN
  Administered 2024-01-30: 500 [IU]

## 2024-02-05 ENCOUNTER — Other Ambulatory Visit: Payer: Self-pay | Admitting: Oncology

## 2024-02-05 DIAGNOSIS — C787 Secondary malignant neoplasm of liver and intrahepatic bile duct: Secondary | ICD-10-CM

## 2024-02-06 ENCOUNTER — Inpatient Hospital Stay

## 2024-02-06 DIAGNOSIS — C20 Malignant neoplasm of rectum: Secondary | ICD-10-CM

## 2024-02-06 DIAGNOSIS — Z452 Encounter for adjustment and management of vascular access device: Secondary | ICD-10-CM

## 2024-02-06 DIAGNOSIS — Z5111 Encounter for antineoplastic chemotherapy: Secondary | ICD-10-CM | POA: Diagnosis not present

## 2024-02-06 LAB — CMP (CANCER CENTER ONLY)
ALT: 19 U/L (ref 0–44)
AST: 23 U/L (ref 15–41)
Albumin: 4 g/dL (ref 3.5–5.0)
Alkaline Phosphatase: 57 U/L (ref 38–126)
Anion gap: 8 (ref 5–15)
BUN: 15 mg/dL (ref 8–23)
CO2: 24 mmol/L (ref 22–32)
Calcium: 9.2 mg/dL (ref 8.9–10.3)
Chloride: 104 mmol/L (ref 98–111)
Creatinine: 0.9 mg/dL (ref 0.61–1.24)
GFR, Estimated: >60 mL/min (ref >60)
Glucose, Bld: 105 mg/dL — ABNORMAL HIGH (ref 70–99)
Potassium: 4.2 mmol/L (ref 3.5–5.1)
Sodium: 137 mmol/L (ref 135–145)
Total Bilirubin: 0.4 mg/dL (ref 0.0–1.2)
Total Protein: 6.5 g/dL (ref 6.5–8.1)

## 2024-02-06 LAB — CBC WITH DIFFERENTIAL (CANCER CENTER ONLY)
Abs Immature Granulocytes: 0.01 10*3/uL (ref 0.00–0.07)
Basophils Absolute: 0 10*3/uL (ref 0.0–0.1)
Basophils Relative: 1 %
Eosinophils Absolute: 0.1 10*3/uL (ref 0.0–0.5)
Eosinophils Relative: 4 %
HCT: 30.5 % — ABNORMAL LOW (ref 39.0–52.0)
Hemoglobin: 9.3 g/dL — ABNORMAL LOW (ref 13.0–17.0)
Immature Granulocytes: 0 %
Lymphocytes Relative: 29 %
Lymphs Abs: 1 10*3/uL (ref 0.7–4.0)
MCH: 24.8 pg — ABNORMAL LOW (ref 26.0–34.0)
MCHC: 30.5 g/dL (ref 30.0–36.0)
MCV: 81.3 fL (ref 80.0–100.0)
Monocytes Absolute: 0.4 10*3/uL (ref 0.1–1.0)
Monocytes Relative: 13 %
Neutro Abs: 1.8 10*3/uL (ref 1.7–7.7)
Neutrophils Relative %: 53 %
Platelet Count: 93 10*3/uL — ABNORMAL LOW (ref 150–400)
RBC: 3.75 MIL/uL — ABNORMAL LOW (ref 4.22–5.81)
RDW: 18.7 % — ABNORMAL HIGH (ref 11.5–15.5)
WBC Count: 3.4 10*3/uL — ABNORMAL LOW (ref 4.0–10.5)
nRBC: 0 % (ref 0.0–0.2)

## 2024-02-06 LAB — CEA (ACCESS): CEA (CHCC): 7.04 ng/mL — ABNORMAL HIGH (ref 0.00–5.00)

## 2024-02-06 MED ORDER — SODIUM CHLORIDE 0.9% FLUSH
10.0000 mL | INTRAVENOUS | Status: AC | PRN
  Administered 2024-02-06: 10 mL

## 2024-02-06 MED ORDER — HEPARIN SOD (PORK) LOCK FLUSH 100 UNIT/ML IV SOLN
500.0000 [IU] | Freq: Once | INTRAVENOUS | Status: AC | PRN
  Administered 2024-02-06: 500 [IU]

## 2024-02-06 NOTE — Addendum Note (Signed)
 Addended by: Josean Lycan M on: 02/06/2024 01:10 PM   Modules accepted: Orders

## 2024-02-06 NOTE — Addendum Note (Signed)
 Addended by: JENI BRIDGETTE MATSU on: 02/06/2024 09:37 AM   Modules accepted: Orders

## 2024-02-10 ENCOUNTER — Ambulatory Visit: Admitting: Hematology and Oncology

## 2024-02-10 ENCOUNTER — Other Ambulatory Visit

## 2024-02-10 ENCOUNTER — Ambulatory Visit

## 2024-02-10 NOTE — Progress Notes (Unsigned)
 [facility]  [address] [place],  [place]  [address] [phone]  Clinic Day:  01/28/2024  Referring physician: Ezzard Valaria LABOR, MD   HISTORY OF PRESENT ILLNESS:  The patient is a 73 y.o. 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  He comes in today to go over his CT scans to ascertain his new disease baseline after receiving 4 cycles of FOLFOX/Avastin .  The patient claims to have tolerated his 3rd cycle of palliative chemotherapy fairly well.  However, he has had slightly more nausea with successive cycles of treatment to where he has needed his ondansetron  more than previously.  He denies having any further rectal bleeding since his palliative radiation and chemotherapy started.  He  denies having any new symptoms which concern him for disease progression while on chemotherapy.  PHYSICAL EXAM:  There were no vitals taken for this visit. Wt Readings from Last 3 Encounters:  01/28/24 177 lb 12.8 oz (80.6 kg)  01/16/24 184 lb 0.6 oz (83.5 kg)  01/14/24 181 lb (82.1 kg)   There is no height or weight on file to calculate BMI. Performance status (ECOG): 0 - Asymptomatic Physical Exam Constitutional:      Appearance: Normal appearance. He is not ill-appearing.  HENT:     Mouth/Throat:     Mouth: Mucous membranes are moist.     Pharynx: Oropharynx is clear. No oropharyngeal exudate or posterior oropharyngeal erythema.   Cardiovascular:     Rate and Rhythm: Normal rate and regular rhythm.     Heart sounds: No murmur heard.    No friction rub. No gallop.  Pulmonary:     Effort: Pulmonary effort is normal. No respiratory distress.     Breath sounds: Normal breath sounds. No wheezing, rhonchi or rales.  Abdominal:     General: Bowel sounds are normal. There is no distension.     Palpations: Abdomen is soft. There is no mass.     Tenderness: There is no abdominal tenderness.   Musculoskeletal:        General:  No swelling.     Right lower leg: No edema.     Left lower leg: No edema.  Lymphadenopathy:     Cervical: No cervical adenopathy.     Upper Body:     Right upper body: No supraclavicular or axillary adenopathy.     Left upper body: No supraclavicular or axillary adenopathy.     Lower Body: No right inguinal adenopathy. No left inguinal adenopathy.   Skin:    General: Skin is warm.     Coloration: Skin is not jaundiced.     Findings: No lesion or rash.   Neurological:     General: No focal deficit present.     Mental Status: He is alert and oriented to person, place, and time. Mental status is at baseline.   Psychiatric:        Mood and Affect: Mood normal.        Behavior: Behavior normal.        Thought Content: Thought content normal.    LABS:      Latest Ref Rng & Units 02/06/2024    9:20 AM 01/28/2024    9:11 AM 01/14/2024    8:48 AM  CBC  WBC 4.0 - 10.5 K/uL 3.4  5.4  5.0   Hemoglobin 13.0 - 17.0 g/dL 9.3  9.9  89.9   Hematocrit 39.0 - 52.0 % 30.5  32.5  32.7   Platelets 150 - 400 K/uL 93  103  125       Latest Ref Rng & Units 02/06/2024    9:20 AM 01/28/2024    9:11 AM 01/14/2024    8:48 AM  CMP  Glucose 70 - 99 mg/dL 894  875  865   BUN 8 - 23 mg/dL 15  17  11    Creatinine 0.61 - 1.24 mg/dL 9.09  9.11  9.14   Sodium 135 - 145 mmol/L 137  136  137   Potassium 3.5 - 5.1 mmol/L 4.2  4.2  4.3   Chloride 98 - 111 mmol/L 104  104  102   CO2 22 - 32 mmol/L 24  24  25    Calcium  8.9 - 10.3 mg/dL 9.2  9.6  9.4   Total Protein 6.5 - 8.1 g/dL 6.5  6.6  6.8   Total Bilirubin 0.0 - 1.2 mg/dL 0.4  0.5  0.6   Alkaline Phos 38 - 126 U/L 57  61  66   AST 15 - 41 U/L 23  23  22    ALT 0 - 44 U/L 19  18  15     Lab Results  Component Value Date   CEA 7.04 (H) 02/06/2024   CEA 13.98 (H) 10/28/2023    Latest Reference Range & Units 01/28/24 09:11  Iron 45 - 182 ug/dL 51  UIBC ug/dL 647  TIBC 749 - 549 ug/dL 596  Saturation Ratios 17.9 - 39.5 % 13 (L)  Ferritin 24 - 336  ng/mL 39  (L): Data is abnormally low  PATHOLOGY:   His ultrasound-guided biopsy revealed the following: LIVER, MASS, CORE BIOPSY  Metastatic moderately differentiated adenocarcinoma cytohistologically  consistent with rectal primary  ---------------------------------------------------------------------------------- The pathologic results from his colonoscopy reveled the following: FINAL DIAGNOSIS        1. Colon, polyp(s), transverse, ascending, and cecum (6) :       - TUBULAR ADENOMA(S)       - NEGATIVE FOR HIGH-GRADE DYSPLASIA OR MALIGNANCY        2. Sigmoid  Colon Polyp, (2) :       - TUBULAR ADENOMA(S)       - NEGATIVE FOR HIGH-GRADE DYSPLASIA OR MALIGNANCY        3. Rectum, biopsy,  :       - AT LEAST INTRAMUCOSAL ADENOCARCINOMA, SEE NOTE        Diagnosis Note : Depth of invasion cannot be judged due to the superficial       nature of the biopsies.   ASSESSMENT & PLAN:  A 73 y.o. male with stage IVA (T3 N2 M1a) rectal cancer.  He will proceed with his fourth cycle of FOLFOX/Avastin  today.  Clinically, he is doing very fairly well.  Due to his increased nausea, his ondansetron  prescription will be refilled.  A prescription was also written for him to take olanzapine  5 mg at night to assist with his chemotherapy-induced nausea.  Physically, the patient looks fine.  I will see him back in 2 weeks before he heads into his fifth cycle of FOLFOX/Avastin .  An abdominal MRI will be done before his next visit to ascertain his new disease baseline after 4 cycles of FOLFOX/Avastin .  The patient understands all the plans discussed today and is in agreement with them.  Bulmaro Feagans DELENA Kerns, MD

## 2024-02-11 ENCOUNTER — Inpatient Hospital Stay: Attending: Oncology | Admitting: Oncology

## 2024-02-11 ENCOUNTER — Inpatient Hospital Stay

## 2024-02-11 ENCOUNTER — Other Ambulatory Visit

## 2024-02-11 VITALS — BP 135/68 | HR 85 | Temp 97.9°F | Resp 16 | Ht 69.0 in | Wt 175.5 lb

## 2024-02-11 DIAGNOSIS — Z5111 Encounter for antineoplastic chemotherapy: Secondary | ICD-10-CM | POA: Insufficient documentation

## 2024-02-11 DIAGNOSIS — C20 Malignant neoplasm of rectum: Secondary | ICD-10-CM | POA: Insufficient documentation

## 2024-02-11 DIAGNOSIS — C787 Secondary malignant neoplasm of liver and intrahepatic bile duct: Secondary | ICD-10-CM | POA: Diagnosis not present

## 2024-02-11 DIAGNOSIS — D649 Anemia, unspecified: Secondary | ICD-10-CM | POA: Diagnosis not present

## 2024-02-11 DIAGNOSIS — G629 Polyneuropathy, unspecified: Secondary | ICD-10-CM | POA: Insufficient documentation

## 2024-02-11 DIAGNOSIS — E611 Iron deficiency: Secondary | ICD-10-CM | POA: Insufficient documentation

## 2024-02-11 DIAGNOSIS — Z79899 Other long term (current) drug therapy: Secondary | ICD-10-CM | POA: Diagnosis not present

## 2024-02-11 MED ORDER — OXALIPLATIN CHEMO INJECTION 100 MG/20ML
85.0000 mg/m2 | Freq: Once | INTRAVENOUS | Status: AC
  Administered 2024-02-11: 175 mg via INTRAVENOUS
  Filled 2024-02-11: qty 35

## 2024-02-11 MED ORDER — PALONOSETRON HCL INJECTION 0.25 MG/5ML
0.2500 mg | Freq: Once | INTRAVENOUS | Status: AC
  Administered 2024-02-11: 0.25 mg via INTRAVENOUS
  Filled 2024-02-11: qty 5

## 2024-02-11 MED ORDER — LEUCOVORIN CALCIUM INJECTION 350 MG
400.0000 mg/m2 | Freq: Once | INTRAVENOUS | Status: AC
  Administered 2024-02-11: 816 mg via INTRAVENOUS
  Filled 2024-02-11: qty 40.8

## 2024-02-11 MED ORDER — SODIUM CHLORIDE 0.9 % IV SOLN
2400.0000 mg/m2 | INTRAVENOUS | Status: DC
  Administered 2024-02-11: 5000 mg via INTRAVENOUS
  Filled 2024-02-11: qty 100

## 2024-02-11 MED ORDER — DEXTROSE 5 % IV SOLN: INTRAVENOUS | Status: DC

## 2024-02-11 MED ORDER — SODIUM CHLORIDE 0.9 % IV SOLN
5.0000 mg/kg | Freq: Once | INTRAVENOUS | Status: AC
  Administered 2024-02-11: 400 mg via INTRAVENOUS
  Filled 2024-02-11: qty 16

## 2024-02-11 MED ORDER — FLUOROURACIL CHEMO INJECTION 2.5 GM/50ML
400.0000 mg/m2 | Freq: Once | INTRAVENOUS | Status: AC
  Administered 2024-02-11: 800 mg via INTRAVENOUS
  Filled 2024-02-11: qty 16

## 2024-02-11 MED ORDER — DEXAMETHASONE SODIUM PHOSPHATE 10 MG/ML IJ SOLN
10.0000 mg | Freq: Once | INTRAMUSCULAR | Status: AC
  Administered 2024-02-11: 10 mg via INTRAVENOUS
  Filled 2024-02-11: qty 1

## 2024-02-11 MED ORDER — SODIUM CHLORIDE 0.9 % IV SOLN: INTRAVENOUS | Status: DC

## 2024-02-11 NOTE — Patient Instructions (Signed)
 Fluorouracil Injection What is this medication? FLUOROURACIL (flure oh YOOR a sil) treats some types of cancer. It works by slowing down the growth of cancer cells. This medicine may be used for other purposes; ask your health care provider or pharmacist if you have questions. COMMON BRAND NAME(S): Adrucil What should I tell my care team before I take this medication? They need to know if you have any of these conditions: Blood disorders Dihydropyrimidine dehydrogenase (DPD) deficiency Infection, such as chickenpox, cold sores, herpes Kidney disease Liver disease Poor nutrition Recent or ongoing radiation therapy An unusual or allergic reaction to fluorouracil, other medications, foods, dyes, or preservatives If you or your partner are pregnant or trying to get pregnant Breast-feeding How should I use this medication? This medication is injected into a vein. It is administered by your care team in a hospital or clinic setting. Talk to your care team about the use of this medication in children. Special care may be needed. Overdosage: If you think you have taken too much of this medicine contact a poison control center or emergency room at once. NOTE: This medicine is only for you. Do not share this medicine with others. What if I miss a dose? Keep appointments for follow-up doses. It is important not to miss your dose. Call your care team if you are unable to keep an appointment. What may interact with this medication? Do not take this medication with any of the following: Live virus vaccines This medication may also interact with the following: Medications that treat or prevent blood clots, such as warfarin, enoxaparin, dalteparin This list may not describe all possible interactions. Give your health care provider a list of all the medicines, herbs, non-prescription drugs, or dietary supplements you use. Also tell them if you smoke, drink alcohol, or use illegal drugs. Some items may  interact with your medicine. What should I watch for while using this medication? Your condition will be monitored carefully while you are receiving this medication. This medication may make you feel generally unwell. This is not uncommon as chemotherapy can affect healthy cells as well as cancer cells. Report any side effects. Continue your course of treatment even though you feel ill unless your care team tells you to stop. In some cases, you may be given additional medications to help with side effects. Follow all directions for their use. This medication may increase your risk of getting an infection. Call your care team for advice if you get a fever, chills, sore throat, or other symptoms of a cold or flu. Do not treat yourself. Try to avoid being around people who are sick. This medication may increase your risk to bruise or bleed. Call your care team if you notice any unusual bleeding. Be careful brushing or flossing your teeth or using a toothpick because you may get an infection or bleed more easily. If you have any dental work done, tell your dentist you are receiving this medication. Avoid taking medications that contain aspirin, acetaminophen, ibuprofen, naproxen, or ketoprofen unless instructed by your care team. These medications may hide a fever. Do not treat diarrhea with over the counter products. Contact your care team if you have diarrhea that lasts more than 2 days or if it is severe and watery. This medication can make you more sensitive to the sun. Keep out of the sun. If you cannot avoid being in the sun, wear protective clothing and sunscreen. Do not use sun lamps, tanning beds, or tanning booths. Talk to  your care team if you or your partner wish to become pregnant or think you might be pregnant. This medication can cause serious birth defects if taken during pregnancy and for 3 months after the last dose. A reliable form of contraception is recommended while taking this  medication and for 3 months after the last dose. Talk to your care team about effective forms of contraception. Do not father a child while taking this medication and for 3 months after the last dose. Use a condom while having sex during this time period. Do not breastfeed while taking this medication. This medication may cause infertility. Talk to your care team if you are concerned about your fertility. What side effects may I notice from receiving this medication? Side effects that you should report to your care team as soon as possible: Allergic reactions--skin rash, itching, hives, swelling of the face, lips, tongue, or throat Heart attack--pain or tightness in the chest, shoulders, arms, or jaw, nausea, shortness of breath, cold or clammy skin, feeling faint or lightheaded Heart failure--shortness of breath, swelling of the ankles, feet, or hands, sudden weight gain, unusual weakness or fatigue Heart rhythm changes--fast or irregular heartbeat, dizziness, feeling faint or lightheaded, chest pain, trouble breathing High ammonia level--unusual weakness or fatigue, confusion, loss of appetite, nausea, vomiting, seizures Infection--fever, chills, cough, sore throat, wounds that don't heal, pain or trouble when passing urine, general feeling of discomfort or being unwell Low red blood cell level--unusual weakness or fatigue, dizziness, headache, trouble breathing Pain, tingling, or numbness in the hands or feet, muscle weakness, change in vision, confusion or trouble speaking, loss of balance or coordination, trouble walking, seizures Redness, swelling, and blistering of the skin over hands and feet Severe or prolonged diarrhea Unusual bruising or bleeding Side effects that usually do not require medical attention (report to your care team if they continue or are bothersome): Dry skin Headache Increased tears Nausea Pain, redness, or swelling with sores inside the mouth or throat Sensitivity  to light Vomiting This list may not describe all possible side effects. Call your doctor for medical advice about side effects. You may report side effects to FDA at 1-800-FDA-1088. Where should I keep my medication? This medication is given in a hospital or clinic. It will not be stored at home. NOTE: This sheet is a summary. It may not cover all possible information. If you have questions about this medicine, talk to your doctor, pharmacist, or health care provider.  2024 [employer]/Gold Standard (2021-12-05 00:00:00)Oxaliplatin Injection What is this medication? OXALIPLATIN (ox AL i PLA tin) treats colorectal cancer. It works by slowing down the growth of cancer cells. This medicine may be used for other purposes; ask your health care provider or pharmacist if you have questions. COMMON BRAND NAME(S): Eloxatin What should I tell my care team before I take this medication? They need to know if you have any of these conditions: Heart disease History of irregular heartbeat or rhythm Liver disease Low blood cell levels (white cells, red cells, and platelets) Lung or breathing disease, such as asthma Take medications that treat or prevent blood clots Tingling of the fingers, toes, or other nerve disorder An unusual or allergic reaction to oxaliplatin, other medications, foods, dyes, or preservatives If you or your partner are pregnant or trying to get pregnant Breast-feeding How should I use this medication? This medication is injected into a vein. It is given by your care team in a hospital or clinic setting. Talk to your care team  about the use of this medication in children. Special care may be needed. Overdosage: If you think you have taken too much of this medicine contact a poison control center or emergency room at once. NOTE: This medicine is only for you. Do not share this medicine with others. What if I miss a dose? Keep appointments for follow-up doses. It is important not to  miss a dose. Call your care team if you are unable to keep an appointment. What may interact with this medication? Do not take this medication with any of the following: Cisapride Dronedarone Pimozide Thioridazine This medication may also interact with the following: Aspirin and aspirin-like medications Certain medications that treat or prevent blood clots, such as warfarin, apixaban, dabigatran, and rivaroxaban Cisplatin Cyclosporine Diuretics Medications for infection, such as acyclovir, adefovir, amphotericin B, bacitracin, cidofovir, foscarnet, ganciclovir, gentamicin, pentamidine, vancomycin NSAIDs, medications for pain and inflammation, such as ibuprofen or naproxen Other medications that cause heart rhythm changes Pamidronate Zoledronic acid This list may not describe all possible interactions. Give your health care provider a list of all the medicines, herbs, non-prescription drugs, or dietary supplements you use. Also tell them if you smoke, drink alcohol, or use illegal drugs. Some items may interact with your medicine. What should I watch for while using this medication? Your condition will be monitored carefully while you are receiving this medication. You may need blood work while taking this medication. This medication may make you feel generally unwell. This is not uncommon as chemotherapy can affect healthy cells as well as cancer cells. Report any side effects. Continue your course of treatment even though you feel ill unless your care team tells you to stop. This medication may increase your risk of getting an infection. Call your care team for advice if you get a fever, chills, sore throat, or other symptoms of a cold or flu. Do not treat yourself. Try to avoid being around people who are sick. Avoid taking medications that contain aspirin, acetaminophen, ibuprofen, naproxen, or ketoprofen unless instructed by your care team. These medications may hide a fever. Be careful  brushing or flossing your teeth or using a toothpick because you may get an infection or bleed more easily. If you have any dental work done, tell your dentist you are receiving this medication. This medication can make you more sensitive to cold. Do not drink cold drinks or use ice. Cover exposed skin before coming in contact with cold temperatures or cold objects. When out in cold weather wear warm clothing and cover your mouth and nose to warm the air that goes into your lungs. Tell your care team if you get sensitive to the cold. Talk to your care team if you or your partner are pregnant or think either of you might be pregnant. This medication can cause serious birth defects if taken during pregnancy and for 9 months after the last dose. A negative pregnancy test is required before starting this medication. A reliable form of contraception is recommended while taking this medication and for 9 months after the last dose. Talk to your care team about effective forms of contraception. Do not father a child while taking this medication and for 6 months after the last dose. Use a condom while having sex during this time period. Do not breastfeed while taking this medication and for 3 months after the last dose. This medication may cause infertility. Talk to your care team if you are concerned about your fertility. What side effects may I  notice from receiving this medication? Side effects that you should report to your care team as soon as possible: Allergic reactions--skin rash, itching, hives, swelling of the face, lips, tongue, or throat Bleeding--bloody or black, tar-like stools, vomiting blood or brown material that looks like coffee grounds, red or dark brown urine, small red or purple spots on skin, unusual bruising or bleeding Dry cough, shortness of breath or trouble breathing Heart rhythm changes--fast or irregular heartbeat, dizziness, feeling faint or lightheaded, chest pain, trouble  breathing Infection--fever, chills, cough, sore throat, wounds that don't heal, pain or trouble when passing urine, general feeling of discomfort or being unwell Liver injury--right upper belly pain, loss of appetite, nausea, light-colored stool, dark yellow or brown urine, yellowing skin or eyes, unusual weakness or fatigue Low red blood cell level--unusual weakness or fatigue, dizziness, headache, trouble breathing Muscle injury--unusual weakness or fatigue, muscle pain, dark yellow or brown urine, decrease in amount of urine Pain, tingling, or numbness in the hands or feet Sudden and severe headache, confusion, change in vision, seizures, which may be signs of posterior reversible encephalopathy syndrome (PRES) Unusual bruising or bleeding Side effects that usually do not require medical attention (report to your care team if they continue or are bothersome): Diarrhea Nausea Pain, redness, or swelling with sores inside the mouth or throat Unusual weakness or fatigue Vomiting This list may not describe all possible side effects. Call your doctor for medical advice about side effects. You may report side effects to FDA at 1-800-FDA-1088. Where should I keep my medication? This medication is given in a hospital or clinic. It will not be stored at home. NOTE: This sheet is a summary. It may not cover all possible information. If you have questions about this medicine, talk to your doctor, pharmacist, or health care provider.  2024 [employer]/Gold Standard (2023-07-12 00:00:00)Leucovorin Injection What is this medication? LEUCOVORIN (loo koe VOR in) prevents side effects from certain medications, such as methotrexate. It works by increasing folate levels. This helps protect healthy cells in your body. It may also be used to treat anemia caused by low levels of folate. It can also be used with fluorouracil, a type of chemotherapy, to treat colorectal cancer. It works by increasing the effects of  fluorouracil in the body. This medicine may be used for other purposes; ask your health care provider or pharmacist if you have questions. What should I tell my care team before I take this medication? They need to know if you have any of these conditions: Anemia from low levels of vitamin B12 in the blood An unusual or allergic reaction to leucovorin, folic acid, other medications, foods, dyes, or preservatives Pregnant or trying to get pregnant Breastfeeding How should I use this medication? This medication is injected into a vein or a muscle. It is given by your care team in a hospital or clinic setting. Talk to your care team about the use of this medication in children. Special care may be needed. Overdosage: If you think you have taken too much of this medicine contact a poison control center or emergency room at once. NOTE: This medicine is only for you. Do not share this medicine with others. What if I miss a dose? Keep appointments for follow-up doses. It is important not to miss your dose. Call your care team if you are unable to keep an appointment. What may interact with this medication? Capecitabine Fluorouracil Phenobarbital Phenytoin Primidone Trimethoprim;sulfamethoxazole This list may not describe all possible interactions. Give your  health care provider a list of all the medicines, herbs, non-prescription drugs, or dietary supplements you use. Also tell them if you smoke, drink alcohol, or use illegal drugs. Some items may interact with your medicine. What should I watch for while using this medication? Your condition will be monitored carefully while you are receiving this medication. This medication may increase the side effects of 5-fluorouracil. Tell your care team if you have diarrhea or mouth sores that do not get better or that get worse. What side effects may I notice from receiving this medication? Side effects that you should report to your care team as soon as  possible: Allergic reactions--skin rash, itching, hives, swelling of the face, lips, tongue, or throat This list may not describe all possible side effects. Call your doctor for medical advice about side effects. You may report side effects to FDA at 1-800-FDA-1088. Where should I keep my medication? This medication is given in a hospital or clinic. It will not be stored at home. NOTE: This sheet is a summary. It may not cover all possible information. If you have questions about this medicine, talk to your doctor, pharmacist, or health care provider.  2024 [employer]/Gold Standard (2022-01-02 00:00:00)Bevacizumab Injection What is this medication? BEVACIZUMAB (be va SIZ yoo mab) treats some types of cancer. It works by blocking a protein that causes cancer cells to grow and multiply. This helps to slow or stop the spread of cancer cells. It is a monoclonal antibody. This medicine may be used for other purposes; ask your health care provider or pharmacist if you have questions. COMMON BRAND NAME(S): Alymsys, Avastin, MVASI, Rosaland Lao What should I tell my care team before I take this medication? They need to know if you have any of these conditions: Blood clots Coughing up blood Having or recent surgery Heart failure High blood pressure History of a connection between 2 or more body parts that do not usually connect (fistula) History of a tear in your stomach or intestines Protein in your urine An unusual or allergic reaction to bevacizumab, other medications, foods, dyes, or preservatives Pregnant or trying to get pregnant Breast-feeding How should I use this medication? This medication is injected into a vein. It is given by your care team in a hospital or clinic setting. Talk to your care team the use of this medication in children. Special care may be needed. Overdosage: If you think you have taken too much of this medicine contact a poison control center or emergency room at  once. NOTE: This medicine is only for you. Do not share this medicine with others. What if I miss a dose? Keep appointments for follow-up doses. It is important not to miss your dose. Call your care team if you are unable to keep an appointment. What may interact with this medication? Interactions are not expected. This list may not describe all possible interactions. Give your health care provider a list of all the medicines, herbs, non-prescription drugs, or dietary supplements you use. Also tell them if you smoke, drink alcohol, or use illegal drugs. Some items may interact with your medicine. What should I watch for while using this medication? Your condition will be monitored carefully while you are receiving this medication. You may need blood work while taking this medication. This medication may make you feel generally unwell. This is not uncommon as chemotherapy can affect healthy cells as well as cancer cells. Report any side effects. Continue your course of treatment even though you feel  ill unless your care team tells you to stop. This medication may increase your risk to bruise or bleed. Call your care team if you notice any unusual bleeding. Before having surgery, talk to your care team to make sure it is ok. This medication can increase the risk of poor healing of your surgical site or wound. You will need to stop this medication for 28 days before surgery. After surgery, wait at least 28 days before restarting this medication. Make sure the surgical site or wound is healed enough before restarting this medication. Talk to your care team if questions. Talk to your care team if you may be pregnant. Serious birth defects can occur if you take this medication during pregnancy and for 6 months after the last dose. Contraception is recommended while taking this medication and for 6 months after the last dose. Your care team can help you find the option that works for you. Do not breastfeed  while taking this medication and for 6 months after the last dose. This medication can cause infertility. Talk to your care team if you are concerned about your fertility. What side effects may I notice from receiving this medication? Side effects that you should report to your care team as soon as possible: Allergic reactions--skin rash, itching, hives, swelling of the face, lips, tongue, or throat Bleeding--bloody or black, tar-like stools, vomiting blood or brown material that looks like coffee grounds, red or dark brown urine, small red or purple spots on skin, unusual bruising or bleeding Blood clot--pain, swelling, or warmth in the leg, shortness of breath, chest pain Heart attack--pain or tightness in the chest, shoulders, arms, or jaw, nausea, shortness of breath, cold or clammy skin, feeling faint or lightheaded Heart failure--shortness of breath, swelling of the ankles, feet, or hands, sudden weight gain, unusual weakness or fatigue Increase in blood pressure Infection--fever, chills, cough, sore throat, wounds that don't heal, pain or trouble when passing urine, general feeling of discomfort or being unwell Infusion reactions--chest pain, shortness of breath or trouble breathing, feeling faint or lightheaded Kidney injury--decrease in the amount of urine, swelling of the ankles, hands, or feet Stomach pain that is severe, does not go away, or gets worse Stroke--sudden numbness or weakness of the face, arm, or leg, trouble speaking, confusion, trouble walking, loss of balance or coordination, dizziness, severe headache, change in vision Sudden and severe headache, confusion, change in vision, seizures, which may be signs of posterior reversible encephalopathy syndrome (PRES) Side effects that usually do not require medical attention (report to your care team if they continue or are bothersome): Back pain Change in taste Diarrhea Dry skin Increased tears Nosebleed This list may not  describe all possible side effects. Call your doctor for medical advice about side effects. You may report side effects to FDA at 1-800-FDA-1088. Where should I keep my medication? This medication is given in a hospital or clinic. It will not be stored at home. NOTE: This sheet is a summary. It may not cover all possible information. If you have questions about this medicine, talk to your doctor, pharmacist, or health care provider.  2024 Else[employer]/Gold Standard (2021-12-15 00:00:00)

## 2024-02-11 NOTE — Progress Notes (Signed)
 Per Dr. Ezzard, Patient okay for treatment wioth platelets of 93

## 2024-02-12 ENCOUNTER — Encounter

## 2024-02-13 ENCOUNTER — Other Ambulatory Visit: Payer: Self-pay | Admitting: Pharmacist

## 2024-02-13 ENCOUNTER — Inpatient Hospital Stay (HOSPITAL_BASED_OUTPATIENT_CLINIC_OR_DEPARTMENT_OTHER): Admission: RE | Admit: 2024-02-13 | Source: Ambulatory Visit | Admitting: Radiology

## 2024-02-13 ENCOUNTER — Inpatient Hospital Stay

## 2024-02-13 VITALS — BP 157/77 | HR 82 | Temp 97.8°F | Resp 18

## 2024-02-13 DIAGNOSIS — Z5111 Encounter for antineoplastic chemotherapy: Secondary | ICD-10-CM | POA: Diagnosis not present

## 2024-02-13 DIAGNOSIS — C20 Malignant neoplasm of rectum: Secondary | ICD-10-CM

## 2024-02-13 MED ORDER — HEPARIN SOD (PORK) LOCK FLUSH 100 UNIT/ML IV SOLN
500.0000 [IU] | Freq: Once | INTRAVENOUS | Status: AC | PRN
  Administered 2024-02-13: 500 [IU]

## 2024-02-13 MED ORDER — SODIUM CHLORIDE 0.9% FLUSH
10.0000 mL | INTRAVENOUS | Status: DC | PRN
Start: 2024-02-13 — End: 2024-02-13
  Administered 2024-02-13: 10 mL

## 2024-02-13 NOTE — Patient Instructions (Signed)
 Central Line in Adults: What to Expect A central line is a soft tube called a catheter that is put into a vein in the neck, chest, arm or groin. The tip of the central line ends in a large vein just above the heart called the vena cava. A central line may be used to: Give medicines or fluids through an IV for a long time. Give you nutrients. Take blood or give you blood for testing or treatments. Give chemotherapy or dialysis. Provide IV access if veins in your hands or arms are difficult to use. Types of central lines There are four main types of central lines: Peripherally inserted central catheter (PICC) line. A PICC line goes up the upper arm to the vena cava. This is used for access of one week or longer. It can be used to draw blood, give fluids or medicines. Tunneled central line. It's placed in a large vein in the neck, chest, or groin. It's put in through a small cut is made over the vein, and then it's advanced to the vena cava. This is used for long-term therapy and dialysis. It's tunneled under the skin and brought out through a second cut. Non-tunneled central line. This type is put in the neck, chest or groin. It's usually used for 7 days at the most. It's often used in the emergency department. Implanted port. It's usually put in the upper chest, but it can also be placed in the upper arm or the belly. This is used for long-term therapy. It can stay in place longer than other types of central lines. It's put in and taken out with surgery, and it's accessed using a needle. The type of central line you get depends on how long you will need it, your medical condition, and the condition of your veins. Tell a health care provider about: Any allergies you have. All medicines you take. These include vitamins, herbs, eye drops, and creams. Any problems you or family members have had with anesthesia. Any bleeding problems you have. Any surgeries you've had. Any medical conditions  you have. Whether you're pregnant or may be pregnant. What are the risks? Your health care provider will talk with you about risks. These may include: Infection. A blood clot that blocks the central line or forms in the vein and travels to the heart. Bleeding. Getting a hole or crack in the central line. If this happens, the central line will need to be replaced. The catheter moving or coming out of place. A collapsed lung. Damage to other structures or organs. What happens before the procedure? Medicines Ask about changing or stopping: Any medicines you take. Any vitamins, herbs, or supplements you take. Do not take aspirin or ibuprofen unless you're told to. Surgery safety For your safety, you may: Need to wash your skin with a soap that kills germs. Get antibiotics. Have your procedure site marked. Have hair removed at the procedure site. General instructions Eat and drink as told. Ask if you'll be staying overnight in the hospital. If you'll be going home right after the procedure, plan to have a responsible adult: Drive you home from the hospital or clinic. You won't be allowed to drive. Stay with you for the time you're told. What happens during the procedure? An IV will be put into one of your veins. You may be given: A sedative to help you relax. Anesthesia to keep you from feeling pain. Your skin will be cleaned with a soap that kills germs, and  you may be covered with a clean sheet called a drape. Your blood pressure, heart rate, breathing rate, and blood oxygen level will be checked during the procedure. The central line catheter will be put into the vein and moved through to the correct spot. The provider may use X-rays to make sure the catheter goes to the right place. A bandage will be placed over the insertion area. These steps may vary. Ask what you can expect. What can I expect after the procedure? You will watched closely until you leave. This includes  checking your pain level, blood pressure, heart rate, and breathing rate. Caps may be put on the ends of the central line tubes. If you were given a sedative, do not drive or use machines until you're told it's safe. A sedative can make you sleepy. Follow these instructions at home: Flushing and cleaning the central line  Follow instructions from your provider about flushing and cleaning the central line and the area around it. Only use germ-free, also called sterile, supplies to flush the central line. Use supplies recommended by your provider. Before you flush the central line or clean the central line or the area around it: Wash your hands with soap and water for at least 20 seconds. If you can't use soap and water, use hand sanitizer. Clean the central line hub with rubbing alcohol. To do this: Clean the central line hub with a new alcohol wipe. Scrub it using a twisting motion and rub for 10 to 15 seconds or for 30 twists. Follow the manufacturer's instructions. Be sure you scrub the top of the hub, not just the sides. Let the hub dry before use. Prevent it from touching anything while drying. Do not re-use alcohol pads. Caring for your skin Check your central line site every day for signs of infection. Check for: Redness, swelling, or pain. Fluid or blood. Warmth. Pus or a bad smell. Keep the insertion site of your central line clean and dry at all times. Change your bandage only as told by your provider. Keep your bandage dry. If it gets wet, have it changed as soon as possible. Storage and throwing away supplies Keep your supplies in a clean, dry location. Throw away any used syringes in a disposal container that is meant for sharp items, called a sharps container. You can buy a sharps container from a pharmacy, or you can make one by using an empty hard plastic bottle with a cover. Place any used bandages or infusion bags into a plastic bag. Throw that bag in the trash. General  instructions Follow instructions from your provider for the type of device that you have. Keep the tube clamped unless it's being used. If you or someone else accidentally pulls on the tube, make sure: The bandage is OK. There is no bleeding. The tube has not been pulled out. Do not use scissors or sharp objects near the tube. Do not take baths, swim, or use a hot tub until you're told it's OK. Ask if you can shower. Ask what things are safe for you to do. Your provider may tell you not to lift anything or move your arm too much on the side of your central line. Contact a health care provider if: You have signs of an infection where the tube was put in. The skin is irritated near the bandage. You catheter appears to be getting longer. This may mean it's coming out of the vein. Get help right away if: You have:  A fever or chills. Shortness of breath. Pain in your chest. A fast heartbeat. Swelling in your neck, face, chest, or arm on the side of your central line. You feel dizzy or you faint. Your cut or central line site has red streaks spreading away from the area. Your cut or central line site is bleeding and won't stop. Your central line is hard to flush or will not flush or you do not get a blood return. Your central line gets loose, damaged or comes out. Your catheter leaks when flushed or when fluids are infused into it. This information is not intended to replace advice given to you by your health care provider. Make sure you discuss any questions you have with your health care provider. Document Revised: 02/25/2023 Document Reviewed: 02/25/2023 Elsevier Patient Education  2024 [employer].

## 2024-02-17 DIAGNOSIS — C787 Secondary malignant neoplasm of liver and intrahepatic bile duct: Secondary | ICD-10-CM | POA: Diagnosis not present

## 2024-02-17 DIAGNOSIS — C20 Malignant neoplasm of rectum: Secondary | ICD-10-CM | POA: Diagnosis not present

## 2024-02-17 NOTE — Progress Notes (Unsigned)
 [facility] [employer] [facility]  [address] [place],  [place]  [address] [phone]  Clinic Day:  02/18/2024  Referring physician: Sherre Clapper, MD   HISTORY OF PRESENT ILLNESS:  The patient is a 73 y.o. 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  He comes in today to go over his abdominal MRI to ascertain his new disease baseline after receiving 4 cycles of FOLFOX/Avastin .  Clinically, the patient has been doing okay.  However, nausea and intermittent fatigue have remained prominent despite having oral antiemetics at home.  He definitely believes these symptoms are related to his chemotherapy.  With respect to his metastatic rectal cancer, he denies having any new symptoms/findings which concern him for disease progression while on chemotherapy.  PHYSICAL EXAM:  Blood pressure (!) 137/92, pulse 85, temperature 98.4 F (36.9 C), temperature source Oral, resp. rate 16, height 5' 9 (1.753 m), weight 178 lb 4.8 oz (80.9 kg), SpO2 98%. Wt Readings from Last 3 Encounters:  02/18/24 178 lb 4.8 oz (80.9 kg)  02/11/24 175 lb 8 oz (79.6 kg)  01/28/24 177 lb 12.8 oz (80.6 kg)   Body mass index is 26.33 kg/m. Performance status (ECOG): 0 - Asymptomatic Physical Exam Constitutional:      Appearance: Normal appearance. He is not ill-appearing.  HENT:     Mouth/Throat:     Mouth: Mucous membranes are moist.     Pharynx: Oropharynx is clear. No oropharyngeal exudate or posterior oropharyngeal erythema.  Cardiovascular:     Rate and Rhythm: Normal rate and regular rhythm.     Heart sounds: No murmur heard.    No friction rub. No gallop.  Pulmonary:     Effort: Pulmonary effort is normal. No respiratory distress.     Breath sounds: Normal breath sounds. No wheezing, rhonchi or rales.  Abdominal:     General: Bowel sounds are normal. There is no distension.     Palpations: Abdomen is soft. There is no mass.     Tenderness: There is no  abdominal tenderness.  Musculoskeletal:        General: No swelling.     Right lower leg: No edema.     Left lower leg: No edema.  Lymphadenopathy:     Cervical: No cervical adenopathy.     Upper Body:     Right upper body: No supraclavicular or axillary adenopathy.     Left upper body: No supraclavicular or axillary adenopathy.     Lower Body: No right inguinal adenopathy. No left inguinal adenopathy.  Skin:    General: Skin is warm.     Coloration: Skin is not jaundiced.     Findings: No lesion or rash.  Neurological:     General: No focal deficit present.     Mental Status: He is alert and oriented to person, place, and time. Mental status is at baseline.  Psychiatric:        Mood and Affect: Mood normal.        Behavior: Behavior normal.        Thought Content: Thought content normal.   SCANS:  His abdominal MRI from 02-17-24 revealed the following:  LABS:      Latest Ref Rng & Units 02/06/2024    9:20 AM 01/28/2024    9:11 AM 01/14/2024    8:48 AM  CBC  WBC 4.0 - 10.5 K/uL 3.4  5.4  5.0   Hemoglobin 13.0 - 17.0 g/dL 9.3  9.9  10.0   Hematocrit 39.0 - 52.0 % 30.5  32.5  32.7   Platelets 150 - 400 K/uL 93  103  125       Latest Ref Rng & Units 02/06/2024    9:20 AM 01/28/2024    9:11 AM 01/14/2024    8:48 AM  CMP  Glucose 70 - 99 mg/dL 894  875  865   BUN 8 - 23 mg/dL 15  17  11    Creatinine 0.61 - 1.24 mg/dL 9.09  9.11  9.14   Sodium 135 - 145 mmol/L 137  136  137   Potassium 3.5 - 5.1 mmol/L 4.2  4.2  4.3   Chloride 98 - 111 mmol/L 104  104  102   CO2 22 - 32 mmol/L 24  24  25    Calcium  8.9 - 10.3 mg/dL 9.2  9.6  9.4   Total Protein 6.5 - 8.1 g/dL 6.5  6.6  6.8   Total Bilirubin 0.0 - 1.2 mg/dL 0.4  0.5  0.6   Alkaline Phos 38 - 126 U/L 57  61  66   AST 15 - 41 U/L 23  23  22    ALT 0 - 44 U/L 19  18  15     Lab Results  Component Value Date   CEA 7.04 (H) 02/06/2024   CEA 13.98 (H) 10/28/2023    PATHOLOGY:   His ultrasound-guided biopsy revealed the  following: LIVER, MASS, CORE BIOPSY  Metastatic moderately differentiated adenocarcinoma cytohistologically  consistent with rectal primary  ---------------------------------------------------------------------------------- The pathologic results from his colonoscopy reveled the following: FINAL DIAGNOSIS        1. Colon, polyp(s), transverse, ascending, and cecum (6) :       - TUBULAR ADENOMA(S)       - NEGATIVE FOR HIGH-GRADE DYSPLASIA OR MALIGNANCY        2. Sigmoid  Colon Polyp, (2) :       - TUBULAR ADENOMA(S)       - NEGATIVE FOR HIGH-GRADE DYSPLASIA OR MALIGNANCY        3. Rectum, biopsy,  :       - AT LEAST INTRAMUCOSAL ADENOCARCINOMA, SEE NOTE        Diagnosis Note : Depth of invasion cannot be judged due to the superficial       nature of the biopsies.   ASSESSMENT & PLAN:  A 73 y.o. male with stage IVA (T3 N2 M1a) rectal cancer.  In clinic today, I went over his abdominal MRI images with him and compared them to the abdominal MRI done in March 2025.  The patient could clearly see that the amount and size of his metastatic liver lesions have decreased after his first 4 cycles of FOLFOX/Avastin .  Furthermore, this gentleman's CEA level has progressively fallen over time, also indicative of a positive disease response from his chemotherapy.  He is scheduled for his fifth cycle of FOLFOX/Avastin  on July 15th.  Due to the side effects he has had from his palliative chemotherapy, the doses will be decreased by 20%.  Otherwise, I will see this patient back 3 weeks from today before he heads into his 6th cycle of palliative FOLFOX/Avastin  chemotherapy.  The patient understands all the plans discussed today and is in agreement with them.  Adriaan Maltese DELENA Kerns, MD

## 2024-02-18 ENCOUNTER — Inpatient Hospital Stay: Admitting: Oncology

## 2024-02-18 ENCOUNTER — Encounter: Payer: Self-pay | Admitting: Oncology

## 2024-02-18 ENCOUNTER — Other Ambulatory Visit: Payer: Self-pay | Admitting: Pharmacist

## 2024-02-18 VITALS — BP 137/92 | HR 85 | Temp 98.4°F | Resp 16 | Ht 69.0 in | Wt 178.3 lb

## 2024-02-18 DIAGNOSIS — C20 Malignant neoplasm of rectum: Secondary | ICD-10-CM | POA: Diagnosis not present

## 2024-02-18 DIAGNOSIS — C787 Secondary malignant neoplasm of liver and intrahepatic bile duct: Secondary | ICD-10-CM | POA: Diagnosis not present

## 2024-02-18 DIAGNOSIS — Z5111 Encounter for antineoplastic chemotherapy: Secondary | ICD-10-CM | POA: Diagnosis not present

## 2024-02-18 NOTE — Progress Notes (Signed)
 Decrease dose of all chemo by 20% due to poor tolerance per Dr. Ezzard.

## 2024-02-24 NOTE — Progress Notes (Deleted)
 [facility]  [address] [place],  [place]  [address] [phone]  Clinic Day:  02/18/2024  Referring physician: Sherre Clapper, MD   HISTORY OF PRESENT ILLNESS:  The patient is a 73 y.o. 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  He comes in today to go over his abdominal MRI to ascertain his new disease baseline after receiving 4 cycles of FOLFOX/Avastin .  Clinically, the patient has been doing okay.  However, nausea and intermittent fatigue have remained prominent despite having oral antiemetics at home.  He definitely believes these symptoms are related to his chemotherapy.  With respect to his metastatic rectal cancer, he denies having any new symptoms/findings which concern him for disease progression while on chemotherapy.  PHYSICAL EXAM:  There were no vitals taken for this visit. Wt Readings from Last 3 Encounters:  02/18/24 178 lb 4.8 oz (80.9 kg)  02/11/24 175 lb 8 oz (79.6 kg)  01/28/24 177 lb 12.8 oz (80.6 kg)   There is no height or weight on file to calculate BMI. Performance status (ECOG): 0 - Asymptomatic Physical Exam Constitutional:      Appearance: Normal appearance. He is not ill-appearing.  HENT:     Mouth/Throat:     Mouth: Mucous membranes are moist.     Pharynx: Oropharynx is clear. No oropharyngeal exudate or posterior oropharyngeal erythema.  Cardiovascular:     Rate and Rhythm: Normal rate and regular rhythm.     Heart sounds: No murmur heard.    No friction rub. No gallop.  Pulmonary:     Effort: Pulmonary effort is normal. No respiratory distress.     Breath sounds: Normal breath sounds. No wheezing, rhonchi or rales.  Abdominal:     General: Bowel sounds are normal. There is no distension.     Palpations: Abdomen is soft. There is no mass.     Tenderness: There is no abdominal tenderness.  Musculoskeletal:        General: No swelling.     Right lower leg: No edema.     Left  lower leg: No edema.  Lymphadenopathy:     Cervical: No cervical adenopathy.     Upper Body:     Right upper body: No supraclavicular or axillary adenopathy.     Left upper body: No supraclavicular or axillary adenopathy.     Lower Body: No right inguinal adenopathy. No left inguinal adenopathy.  Skin:    General: Skin is warm.     Coloration: Skin is not jaundiced.     Findings: No lesion or rash.  Neurological:     General: No focal deficit present.     Mental Status: He is alert and oriented to person, place, and time. Mental status is at baseline.  Psychiatric:        Mood and Affect: Mood normal.        Behavior: Behavior normal.        Thought Content: Thought content normal.   SCANS:  His abdominal MRI from 02-17-24 revealed the following:  LABS:      Latest Ref Rng & Units 02/06/2024    9:20 AM 01/28/2024    9:11 AM 01/14/2024    8:48 AM  CBC  WBC 4.0 - 10.5 K/uL 3.4  5.4  5.0   Hemoglobin 13.0 - 17.0 g/dL 9.3  9.9  89.9   Hematocrit 39.0 - 52.0 % 30.5  32.5  32.7   Platelets 150 -  400 K/uL 93  103  125       Latest Ref Rng & Units 02/06/2024    9:20 AM 01/28/2024    9:11 AM 01/14/2024    8:48 AM  CMP  Glucose 70 - 99 mg/dL 894  875  865   BUN 8 - 23 mg/dL 15  17  11    Creatinine 0.61 - 1.24 mg/dL 9.09  9.11  9.14   Sodium 135 - 145 mmol/L 137  136  137   Potassium 3.5 - 5.1 mmol/L 4.2  4.2  4.3   Chloride 98 - 111 mmol/L 104  104  102   CO2 22 - 32 mmol/L 24  24  25    Calcium  8.9 - 10.3 mg/dL 9.2  9.6  9.4   Total Protein 6.5 - 8.1 g/dL 6.5  6.6  6.8   Total Bilirubin 0.0 - 1.2 mg/dL 0.4  0.5  0.6   Alkaline Phos 38 - 126 U/L 57  61  66   AST 15 - 41 U/L 23  23  22    ALT 0 - 44 U/L 19  18  15     Lab Results  Component Value Date   CEA 7.04 (H) 02/06/2024   CEA 13.98 (H) 10/28/2023    PATHOLOGY:   His ultrasound-guided biopsy revealed the following: LIVER, MASS, CORE BIOPSY  Metastatic moderately differentiated adenocarcinoma cytohistologically  consistent  with rectal primary  ---------------------------------------------------------------------------------- The pathologic results from his colonoscopy reveled the following: FINAL DIAGNOSIS        1. Colon, polyp(s), transverse, ascending, and cecum (6) :       - TUBULAR ADENOMA(S)       - NEGATIVE FOR HIGH-GRADE DYSPLASIA OR MALIGNANCY        2. Sigmoid  Colon Polyp, (2) :       - TUBULAR ADENOMA(S)       - NEGATIVE FOR HIGH-GRADE DYSPLASIA OR MALIGNANCY        3. Rectum, biopsy,  :       - AT LEAST INTRAMUCOSAL ADENOCARCINOMA, SEE NOTE        Diagnosis Note : Depth of invasion cannot be judged due to the superficial       nature of the biopsies.   ASSESSMENT & PLAN:  A 73 y.o. male with stage IVA (T3 N2 M1a) rectal cancer.  In clinic today, I went over his abdominal MRI images with him and compared them to the abdominal MRI done in March 2025.  The patient could clearly see that the amount and size of his metastatic liver lesions have decreased after his first 4 cycles of FOLFOX/Avastin .  Furthermore, this gentleman's CEA level has progressively fallen over time, also indicative of a positive disease response from his chemotherapy.  He is scheduled for his fifth cycle of FOLFOX/Avastin  on July 15th.  Due to the side effects he has had from his palliative chemotherapy, the doses will be decreased by 20%.  Otherwise, I will see this patient back 3 weeks from today before he heads into his 6th cycle of palliative FOLFOX/Avastin  chemotherapy.  The patient understands all the plans discussed today and is in agreement with them.  Atlantis Delong DELENA Kerns, MD

## 2024-02-25 ENCOUNTER — Inpatient Hospital Stay

## 2024-02-25 ENCOUNTER — Other Ambulatory Visit: Payer: Self-pay | Admitting: Pharmacist

## 2024-02-25 ENCOUNTER — Inpatient Hospital Stay: Admitting: Oncology

## 2024-02-25 ENCOUNTER — Telehealth: Payer: Self-pay | Admitting: Dietician

## 2024-02-25 ENCOUNTER — Encounter: Payer: Self-pay | Admitting: Oncology

## 2024-02-25 VITALS — BP 141/77 | HR 83 | Temp 98.1°F | Resp 18 | Ht 69.0 in | Wt 176.0 lb

## 2024-02-25 DIAGNOSIS — Z452 Encounter for adjustment and management of vascular access device: Secondary | ICD-10-CM

## 2024-02-25 DIAGNOSIS — C20 Malignant neoplasm of rectum: Secondary | ICD-10-CM

## 2024-02-25 DIAGNOSIS — Z5111 Encounter for antineoplastic chemotherapy: Secondary | ICD-10-CM | POA: Diagnosis not present

## 2024-02-25 LAB — CMP (CANCER CENTER ONLY)
ALT: 46 U/L — ABNORMAL HIGH (ref 0–44)
AST: 33 U/L (ref 15–41)
Albumin: 3.9 g/dL (ref 3.5–5.0)
Alkaline Phosphatase: 63 U/L (ref 38–126)
Anion gap: 10 (ref 5–15)
BUN: 12 mg/dL (ref 8–23)
CO2: 25 mmol/L (ref 22–32)
Calcium: 9.4 mg/dL (ref 8.9–10.3)
Chloride: 107 mmol/L (ref 98–111)
Creatinine: 1.05 mg/dL (ref 0.61–1.24)
GFR, Estimated: >60 mL/min (ref >60)
Glucose, Bld: 97 mg/dL (ref 70–99)
Potassium: 4.1 mmol/L (ref 3.5–5.1)
Sodium: 143 mmol/L (ref 135–145)
Total Bilirubin: 0.7 mg/dL (ref 0.0–1.2)
Total Protein: 6.9 g/dL (ref 6.5–8.1)

## 2024-02-25 LAB — CBC WITH DIFFERENTIAL (CANCER CENTER ONLY)
Abs Immature Granulocytes: 0.01 K/uL (ref 0.00–0.07)
Basophils Absolute: 0 K/uL (ref 0.0–0.1)
Basophils Relative: 1 %
Eosinophils Absolute: 0.1 K/uL (ref 0.0–0.5)
Eosinophils Relative: 2 %
HCT: 30.3 % — ABNORMAL LOW (ref 39.0–52.0)
Hemoglobin: 9.5 g/dL — ABNORMAL LOW (ref 13.0–17.0)
Immature Granulocytes: 0 %
Lymphocytes Relative: 25 %
Lymphs Abs: 0.8 K/uL (ref 0.7–4.0)
MCH: 26.2 pg (ref 26.0–34.0)
MCHC: 31.4 g/dL (ref 30.0–36.0)
MCV: 83.5 fL (ref 80.0–100.0)
Monocytes Absolute: 0.4 K/uL (ref 0.1–1.0)
Monocytes Relative: 11 %
Neutro Abs: 2 K/uL (ref 1.7–7.7)
Neutrophils Relative %: 61 %
Platelet Count: 98 K/uL — ABNORMAL LOW (ref 150–400)
RBC: 3.63 MIL/uL — ABNORMAL LOW (ref 4.22–5.81)
RDW: 21.2 % — ABNORMAL HIGH (ref 11.5–15.5)
WBC Count: 3.3 K/uL — ABNORMAL LOW (ref 4.0–10.5)
nRBC: 0 % (ref 0.0–0.2)

## 2024-02-25 LAB — TOTAL PROTEIN, URINE DIPSTICK: Protein, ur: NEGATIVE mg/dL

## 2024-02-25 MED ORDER — SODIUM CHLORIDE 0.9 % IV SOLN
1920.0000 mg/m2 | INTRAVENOUS | Status: DC
  Administered 2024-02-25: 3900 mg via INTRAVENOUS
  Filled 2024-02-25: qty 78

## 2024-02-25 MED ORDER — SODIUM CHLORIDE 0.9 % IV SOLN
1000.0000 mg | Freq: Once | INTRAVENOUS | Status: AC
  Administered 2024-02-25: 1000 mg via INTRAVENOUS
  Filled 2024-02-25: qty 10

## 2024-02-25 MED ORDER — DEXAMETHASONE SODIUM PHOSPHATE 10 MG/ML IJ SOLN
10.0000 mg | Freq: Once | INTRAMUSCULAR | Status: AC
  Administered 2024-02-25: 10 mg via INTRAVENOUS
  Filled 2024-02-25: qty 1

## 2024-02-25 MED ORDER — SODIUM CHLORIDE 0.9 % IV SOLN: INTRAVENOUS | Status: DC

## 2024-02-25 MED ORDER — ACETAMINOPHEN 325 MG PO TABS
650.0000 mg | ORAL_TABLET | Freq: Once | ORAL | Status: AC
  Administered 2024-02-25: 650 mg via ORAL
  Filled 2024-02-25: qty 2

## 2024-02-25 MED ORDER — SODIUM CHLORIDE 0.9 % IV SOLN
5.0000 mg/kg | Freq: Once | INTRAVENOUS | Status: AC
  Administered 2024-02-25: 400 mg via INTRAVENOUS
  Filled 2024-02-25: qty 16

## 2024-02-25 MED ORDER — PALONOSETRON HCL INJECTION 0.25 MG/5ML
0.2500 mg | Freq: Once | INTRAVENOUS | Status: AC
  Administered 2024-02-25: 0.25 mg via INTRAVENOUS
  Filled 2024-02-25: qty 5

## 2024-02-25 MED ORDER — SODIUM CHLORIDE 0.9 % IV SOLN
INTRAVENOUS | Status: DC
Start: 2024-02-25 — End: 2024-02-25

## 2024-02-25 MED ORDER — LORATADINE 10 MG PO TABS
10.0000 mg | ORAL_TABLET | Freq: Once | ORAL | Status: AC
Start: 2024-02-25 — End: 2024-02-25
  Administered 2024-02-25: 10 mg via ORAL
  Filled 2024-02-25: qty 1

## 2024-02-25 MED ORDER — DEXTROSE 5 % IV SOLN
INTRAVENOUS | Status: DC
Start: 2024-02-25 — End: 2024-02-25

## 2024-02-25 MED ORDER — FLUOROURACIL CHEMO INJECTION 2.5 GM/50ML
320.0000 mg/m2 | Freq: Once | INTRAVENOUS | Status: AC
  Administered 2024-02-25: 650 mg via INTRAVENOUS
  Filled 2024-02-25: qty 13

## 2024-02-25 MED ORDER — LEUCOVORIN CALCIUM INJECTION 350 MG
320.0000 mg/m2 | Freq: Once | INTRAVENOUS | Status: AC
  Administered 2024-02-25: 652 mg via INTRAVENOUS
  Filled 2024-02-25: qty 32.6

## 2024-02-25 MED ORDER — OXALIPLATIN CHEMO INJECTION 50 MG/10ML
66.0000 mg/m2 | Freq: Once | INTRAVENOUS | Status: AC
  Administered 2024-02-25: 135 mg via INTRAVENOUS
  Filled 2024-02-25: qty 20

## 2024-02-25 NOTE — Patient Instructions (Signed)
 Ferric Derisomaltose  Injection What is this medication? FERRIC DERISOMALTOSE  (FER ik der EYE soe MAWL tose) treats low levels of iron in your body (iron deficiency anemia). Iron is a mineral that plays an important role in making red blood cells, which carry oxygen from your lungs to the rest of your body. This medicine may be used for other purposes; ask your health care provider or pharmacist if you have questions. COMMON BRAND NAME(S): MONOFERRIC  What should I tell my care team before I take this medication? They need to know if you have any of these conditions: High levels of iron in the blood An unusual or allergic reaction to iron, other medications, foods, dyes, or preservatives Pregnant or trying to get pregnant Breastfeeding How should I use this medication? This medication is injected into a vein. It is given by your care team in a hospital or clinic setting. Talk to your care team about the use of this medication in children. Special care may be needed. Overdosage: If you think you have taken too much of this medicine contact a poison control center or emergency room at once. NOTE: This medicine is only for you. Do not share this medicine with others. What if I miss a dose? It is important not to miss your dose. Call your care team if you are unable to keep an appointment. What may interact with this medication? Do not take this medication with any of the following: Deferoxamine Dimercaprol Other iron products This list may not describe all possible interactions. Give your health care provider a list of all the medicines, herbs, non-prescription drugs, or dietary supplements you use. Also tell them if you smoke, drink alcohol, or use illegal drugs. Some items may interact with your medicine. What should I watch for while using this medication? Visit your care team for regular checks on your progress. Tell your care team if your symptoms do not start to get better or if they get  worse. You may need blood work done while you are taking this medication. You may need to eat more foods that contain iron. Talk to your care team. Foods that contain iron include whole grains or cereals, dried fruits, beans, peas, leafy green vegetables, and organ meats (liver, kidney). What side effects may I notice from receiving this medication? Side effects that you should report to your care team as soon as possible: Allergic reactions--skin rash, itching, hives, swelling of the face, lips, tongue, or throat Low blood pressure--dizziness, feeling faint or lightheaded, blurry vision Shortness of breath Side effects that usually do not require medical attention (report to your care team if they continue or are bothersome): Flushing Headache Joint pain Muscle pain Nausea Pain, redness, or irritation at injection site This list may not describe all possible side effects. Call your doctor for medical advice about side effects. You may report side effects to FDA at 1-800-FDA-1088. Where should I keep my medication? This medication is given in a hospital or clinic. It will not be stored at home. NOTE: This sheet is a summary. It may not cover all possible information. If you have questions about this medicine, talk to your doctor, pharmacist, or health care provider.  2024 Elsevier/Gold Standard (2023-03-20 00:00:00)Oxaliplatin  Injection What is this medication? OXALIPLATIN  (ox AL i PLA tin) treats colorectal cancer. It works by slowing down the growth of cancer cells. This medicine may be used for other purposes; ask your health care provider or pharmacist if you have questions. COMMON BRAND NAME(S): Eloxatin  What  should I tell my care team before I take this medication? They need to know if you have any of these conditions: Heart disease History of irregular heartbeat or rhythm Liver disease Low blood cell levels (white cells, red cells, and platelets) Lung or breathing disease, such  as asthma Take medications that treat or prevent blood clots Tingling of the fingers, toes, or other nerve disorder An unusual or allergic reaction to oxaliplatin , other medications, foods, dyes, or preservatives If you or your partner are pregnant or trying to get pregnant Breast-feeding How should I use this medication? This medication is injected into a vein. It is given by your care team in a hospital or clinic setting. Talk to your care team about the use of this medication in children. Special care may be needed. Overdosage: If you think you have taken too much of this medicine contact a poison control center or emergency room at once. NOTE: This medicine is only for you. Do not share this medicine with others. What if I miss a dose? Keep appointments for follow-up doses. It is important not to miss a dose. Call your care team if you are unable to keep an appointment. What may interact with this medication? Do not take this medication with any of the following: Cisapride Dronedarone Pimozide Thioridazine This medication may also interact with the following: Aspirin and aspirin-like medications Certain medications that treat or prevent blood clots, such as warfarin, apixaban, dabigatran, and rivaroxaban Cisplatin Cyclosporine Diuretics Medications for infection, such as acyclovir, adefovir, amphotericin B, bacitracin , cidofovir, foscarnet, ganciclovir, gentamicin, pentamidine, vancomycin NSAIDs, medications for pain and inflammation, such as ibuprofen  or naproxen Other medications that cause heart rhythm changes Pamidronate Zoledronic acid This list may not describe all possible interactions. Give your health care provider a list of all the medicines, herbs, non-prescription drugs, or dietary supplements you use. Also tell them if you smoke, drink alcohol, or use illegal drugs. Some items may interact with your medicine. What should I watch for while using this medication? Your  condition will be monitored carefully while you are receiving this medication. You may need blood work while taking this medication. This medication may make you feel generally unwell. This is not uncommon as chemotherapy can affect healthy cells as well as cancer cells. Report any side effects. Continue your course of treatment even though you feel ill unless your care team tells you to stop. This medication may increase your risk of getting an infection. Call your care team for advice if you get a fever, chills, sore throat, or other symptoms of a cold or flu. Do not treat yourself. Try to avoid being around people who are sick. Avoid taking medications that contain aspirin, acetaminophen , ibuprofen , naproxen, or ketoprofen unless instructed by your care team. These medications may hide a fever. Be careful brushing or flossing your teeth or using a toothpick because you may get an infection or bleed more easily. If you have any dental work done, tell your dentist you are receiving this medication. This medication can make you more sensitive to cold. Do not drink cold drinks or use ice. Cover exposed skin before coming in contact with cold temperatures or cold objects. When out in cold weather wear warm clothing and cover your mouth and nose to warm the air that goes into your lungs. Tell your care team if you get sensitive to the cold. Talk to your care team if you or your partner are pregnant or think either of you might be  pregnant. This medication can cause serious birth defects if taken during pregnancy and for 9 months after the last dose. A negative pregnancy test is required before starting this medication. A reliable form of contraception is recommended while taking this medication and for 9 months after the last dose. Talk to your care team about effective forms of contraception. Do not father a child while taking this medication and for 6 months after the last dose. Use a condom while having sex  during this time period. Do not breastfeed while taking this medication and for 3 months after the last dose. This medication may cause infertility. Talk to your care team if you are concerned about your fertility. What side effects may I notice from receiving this medication? Side effects that you should report to your care team as soon as possible: Allergic reactions--skin rash, itching, hives, swelling of the face, lips, tongue, or throat Bleeding--bloody or black, tar-like stools, vomiting blood or brown material that looks like coffee grounds, red or dark brown urine, small red or purple spots on skin, unusual bruising or bleeding Dry cough, shortness of breath or trouble breathing Heart rhythm changes--fast or irregular heartbeat, dizziness, feeling faint or lightheaded, chest pain, trouble breathing Infection--fever, chills, cough, sore throat, wounds that don't heal, pain or trouble when passing urine, general feeling of discomfort or being unwell Liver injury--right upper belly pain, loss of appetite, nausea, light-colored stool, dark yellow or brown urine, yellowing skin or eyes, unusual weakness or fatigue Low red blood cell level--unusual weakness or fatigue, dizziness, headache, trouble breathing Muscle injury--unusual weakness or fatigue, muscle pain, dark yellow or brown urine, decrease in amount of urine Pain, tingling, or numbness in the hands or feet Sudden and severe headache, confusion, change in vision, seizures, which may be signs of posterior reversible encephalopathy syndrome (PRES) Unusual bruising or bleeding Side effects that usually do not require medical attention (report to your care team if they continue or are bothersome): Diarrhea Nausea Pain, redness, or swelling with sores inside the mouth or throat Unusual weakness or fatigue Vomiting This list may not describe all possible side effects. Call your doctor for medical advice about side effects. You may report  side effects to FDA at 1-800-FDA-1088. Where should I keep my medication? This medication is given in a hospital or clinic. It will not be stored at home. NOTE: This sheet is a summary. It may not cover all possible information. If you have questions about this medicine, talk to your doctor, pharmacist, or health care provider.  2024 Elsevier/Gold Standard (2023-07-12 00:00:00)

## 2024-02-25 NOTE — Progress Notes (Signed)
 Per Dr Ezzard, OK to treat with platelet count of 98 today

## 2024-02-25 NOTE — Telephone Encounter (Signed)
Patient screened on [facility]. Second attempt to reach. Provided my cell# on voice mail to return call to set up a nutrition consult.  April Manson, RDN, LDN Registered Dietitian, [place] Part Time Remote (Usual office hours: Tuesday-Thursday) Cell: [phone]

## 2024-02-27 ENCOUNTER — Inpatient Hospital Stay

## 2024-02-27 VITALS — BP 144/70 | HR 93 | Temp 98.2°F

## 2024-02-27 DIAGNOSIS — C20 Malignant neoplasm of rectum: Secondary | ICD-10-CM

## 2024-02-27 DIAGNOSIS — Z5111 Encounter for antineoplastic chemotherapy: Secondary | ICD-10-CM | POA: Diagnosis not present

## 2024-02-27 MED ORDER — SODIUM CHLORIDE 0.9% FLUSH
10.0000 mL | INTRAVENOUS | Status: DC | PRN
  Administered 2024-02-27: 10 mL

## 2024-02-27 MED ORDER — HEPARIN SOD (PORK) LOCK FLUSH 100 UNIT/ML IV SOLN
500.0000 [IU] | Freq: Once | INTRAVENOUS | Status: AC | PRN
Start: 2024-02-27 — End: 2024-02-27
  Administered 2024-02-27: 500 [IU]

## 2024-02-27 NOTE — Patient Instructions (Signed)
 Managing Chemotherapy Side Effects, Adult Chemotherapy is a treatment that uses medicine to kill cancer cells. However, in addition to killing cancer cells, the medicines can also damage healthy cells. The damage to healthy cells can lead to side effects. The exact side effects depend on the specific medicines used. Most of the side effects of chemotherapy go away once treatment is finished. Until then, work closely with your health care providers and take an active role in managing your side effects. What are common side effects of chemotherapy? Increased risk of infection, bruising, or bleeding. Nausea and vomiting. Constipation or diarrhea. Loss of appetite. Hair loss. Mouth or throat sores. Tiredness (fatigue). Tingling, pain, or numbness in the hands and feet. Dry, sensitive, itchy, or sore skin. Sleep disturbances, such as excessive sleepiness. Confusion, anxiety, or mood swings. Memory changes. How to manage the side effects of chemotherapy Medicines Take over-the-counter and prescription medicines only as told by your health care provider. Talk with your health care provider before taking vitamins, herbs, supplements, or over-the-counter medicines. Some of these can interfere with chemotherapy. Activity Get plenty of rest. Get regular exercise by doing activities such as walking, gentle yoga, or tai chi. Return to your normal activities as told by your health care provider. Ask your health care provider what activities are safe for you. Eating and drinking  Talk to a dietitian about what you should eat and drink during cancer treatment. Drink enough fluid to keep your urine pale yellow. If you have side effects that affect eating, these tips may help: Eat smaller meals and snacks often. Drink high-nutrition and high-calorie shakes or supplements. Choose bland and soft foods that are easy to eat. Do not eat foods that are hot, spicy, or hard to swallow. Do not eat raw or  undercooked meat, eggs, or seafood. Always wash fresh fruits and vegetables well before eating them. Skin care If you have sore or itchy skin: Wear soft, comfortable clothing. Apply creams and ointments to your skin as told by your health care provider. If you lose your hair, consider wearing a wig, hat, or scarf to cover your head. You may want to have someone shave your head as you start to lose hair. During outdoor activities, protect your head and skin from the sun by using sunscreen with an SPF of 30 or higher or by wearing protective clothing and a hat. Meet with a hair and skin care specialist for makeup and skin care tips. Apply sunscreen to your scalp as told by your health care provider. General tips Learn as much as you can about your condition. If you are struggling emotionally, talk with a mental health care provider or join a support group. Keep all follow-up visits. This is important. How to prevent infection and bleeding Chemotherapy may lower your blood counts and put you at risk for infection and bleeding. Here are some ways to help prevent problems. Vaccines Talk to your health care provider about vaccines. You should not get any live vaccines, such as the polio, MMR, chickenpox, and shingles vaccines until your health care provider says that it is safe to do so. Do not be around people who have had live vaccines for as long as your health care provider recommends. Make sure you get a yearly flu shot. People who will be near you should also get a yearly flu shot. Social activity Stay away from crowded places where you could be exposed to germs. Do not be around people who may be sick or  people who have symptoms of a fever until they have been fever-free for at least 24 hours. Do not share food, cups, straws, or utensils with other people. Wear a mask when outside the home if your blood counts are low. Cleanliness  Wash your hands often for at least 20 seconds. Also make  sure that other members of your household wash their hands often. Brush your teeth twice daily using a soft toothbrush. Use mouth rinse only as told by your health care provider. Take a bath or shower daily unless your health care provider gives different instructions. General tips Take your temperature regularly, especially if you have chills or feel warm. Check with your health care provider: Before you travel. Before you have a dental procedure. Before you use a swimming pool, hot tub, or swim in a lake or ocean. If you get chemotherapy through an IV or port, check the site every day for signs of infection. Check for redness, swelling, pain, fluid, and warmth. Avoid activities that put you at risk for injury or bleeding. Use an electric razor to shave instead of a blade. Questions to ask your health care provider What are the most common side effects of my treatment? How will they affect my daily life? What can I do to manage them? What are some possible long-term side effects? What are possible complications? What support services are available? What number can I call with questions or concerns? Where to find support Cancer affects the entire family. Find out what family support resources are available from your cancer treatment center. For more support, turn to: Your cancer care team. Friends and family. Your religious community. Other people with cancer. Community-based or online support groups. Where to find more information National Cancer Institute: www.cancer.gov American Cancer Society: www.cancer.org Contact a health care provider if: You bleed or bruise more often. You notice blood in your urine or stool. You have any of these symptoms: A skin rash, or dry or itchy skin. A headache or stiff neck. Cold or flu symptoms. A cough. Persistent nausea or vomiting. Persistent diarrhea. Frequent urination, burning when passing urine, or foul-smelling urine. You cannot eat  because of mouth or throat pain. You are sad, confused, anxious, or depressed. Get help right away if: You have any of these symptoms: A fever or chills. Your health care provider should know about this right away. Redness, swelling, pain, fluid, or warmth near an IV site. Bleeding that you cannot stop. A seizure. You cannot swallow. You have chest pain. You have trouble breathing. A family member or caregiver should get help right away if you have a sudden or unusual change in behavior. These symptoms may be an emergency. Get help right away. Call 911. Do not wait to see if the symptoms will go away. Do not drive yourself to the hospital. Summary Chemotherapy is a treatment that uses medicine to kill cancer cells and can cause side effects. The specific side effects depend on the specific medicines used. Learn as much as you can about your condition. Ask about side effects to watch for and how to treat them. Seek out support and resources from others. Find out what family support resources are available from your cancer treatment center. Let your health care provider know if you notice any new, unusual, or worsening symptoms, especially fever or chills. This information is not intended to replace advice given to you by your health care provider. Make sure you discuss any questions you have with your health  care provider. Document Revised: 07/20/2021 Document Reviewed: 07/20/2021 Elsevier Patient Education  2024 [employer].

## 2024-03-04 ENCOUNTER — Encounter: Payer: Self-pay | Admitting: Oncology

## 2024-03-04 ENCOUNTER — Other Ambulatory Visit: Payer: Self-pay | Admitting: Oncology

## 2024-03-04 DIAGNOSIS — C20 Malignant neoplasm of rectum: Secondary | ICD-10-CM

## 2024-03-06 ENCOUNTER — Encounter: Payer: Self-pay | Admitting: Oncology

## 2024-03-09 ENCOUNTER — Encounter: Payer: Self-pay | Admitting: Oncology

## 2024-03-10 ENCOUNTER — Other Ambulatory Visit

## 2024-03-10 ENCOUNTER — Ambulatory Visit: Admitting: Oncology

## 2024-03-10 ENCOUNTER — Ambulatory Visit

## 2024-03-10 ENCOUNTER — Inpatient Hospital Stay

## 2024-03-10 ENCOUNTER — Inpatient Hospital Stay: Admitting: Hematology and Oncology

## 2024-03-10 ENCOUNTER — Telehealth: Payer: Self-pay | Admitting: Hematology and Oncology

## 2024-03-10 ENCOUNTER — Encounter: Payer: Self-pay | Admitting: Hematology and Oncology

## 2024-03-10 VITALS — BP 144/78 | HR 82 | Temp 98.4°F | Resp 18 | Ht 69.0 in | Wt 178.7 lb

## 2024-03-10 DIAGNOSIS — C20 Malignant neoplasm of rectum: Secondary | ICD-10-CM

## 2024-03-10 DIAGNOSIS — C787 Secondary malignant neoplasm of liver and intrahepatic bile duct: Secondary | ICD-10-CM | POA: Diagnosis not present

## 2024-03-10 DIAGNOSIS — Z5111 Encounter for antineoplastic chemotherapy: Secondary | ICD-10-CM | POA: Diagnosis not present

## 2024-03-10 LAB — CBC WITH DIFFERENTIAL (CANCER CENTER ONLY)
Abs Immature Granulocytes: 0.01 K/uL (ref 0.00–0.07)
Basophils Absolute: 0 K/uL (ref 0.0–0.1)
Basophils Relative: 0 %
Eosinophils Absolute: 0.1 K/uL (ref 0.0–0.5)
Eosinophils Relative: 1 %
HCT: 32.1 % — ABNORMAL LOW (ref 39.0–52.0)
Hemoglobin: 10.1 g/dL — ABNORMAL LOW (ref 13.0–17.0)
Immature Granulocytes: 0 %
Lymphocytes Relative: 19 %
Lymphs Abs: 0.9 K/uL (ref 0.7–4.0)
MCH: 27.6 pg (ref 26.0–34.0)
MCHC: 31.5 g/dL (ref 30.0–36.0)
MCV: 87.7 fL (ref 80.0–100.0)
Monocytes Absolute: 0.5 K/uL (ref 0.1–1.0)
Monocytes Relative: 11 %
Neutro Abs: 3.3 K/uL (ref 1.7–7.7)
Neutrophils Relative %: 69 %
Platelet Count: 103 K/uL — ABNORMAL LOW (ref 150–400)
RBC: 3.66 MIL/uL — ABNORMAL LOW (ref 4.22–5.81)
RDW: 22.8 % — ABNORMAL HIGH (ref 11.5–15.5)
WBC Count: 4.8 K/uL (ref 4.0–10.5)
nRBC: 0 % (ref 0.0–0.2)

## 2024-03-10 LAB — CMP (CANCER CENTER ONLY)
ALT: 23 U/L (ref 0–44)
AST: 27 U/L (ref 15–41)
Albumin: 3.9 g/dL (ref 3.5–5.0)
Alkaline Phosphatase: 63 U/L (ref 38–126)
Anion gap: 10 (ref 5–15)
BUN: 11 mg/dL (ref 8–23)
CO2: 24 mmol/L (ref 22–32)
Calcium: 9.2 mg/dL (ref 8.9–10.3)
Chloride: 105 mmol/L (ref 98–111)
Creatinine: 1.04 mg/dL (ref 0.61–1.24)
GFR, Estimated: >60 mL/min (ref >60)
Glucose, Bld: 107 mg/dL — ABNORMAL HIGH (ref 70–99)
Potassium: 4.3 mmol/L (ref 3.5–5.1)
Sodium: 140 mmol/L (ref 135–145)
Total Bilirubin: 0.7 mg/dL (ref 0.0–1.2)
Total Protein: 6.6 g/dL (ref 6.5–8.1)

## 2024-03-10 LAB — TOTAL PROTEIN, URINE DIPSTICK: Protein, ur: NEGATIVE mg/dL

## 2024-03-10 MED ORDER — SODIUM CHLORIDE 0.9 % IV SOLN: INTRAVENOUS | Status: DC

## 2024-03-10 MED ORDER — PALONOSETRON HCL INJECTION 0.25 MG/5ML
0.2500 mg | Freq: Once | INTRAVENOUS | Status: AC
  Administered 2024-03-10: 0.25 mg via INTRAVENOUS
  Filled 2024-03-10: qty 5

## 2024-03-10 MED ORDER — DEXTROSE 5 % IV SOLN: INTRAVENOUS | Status: DC

## 2024-03-10 MED ORDER — SODIUM CHLORIDE 0.9 % IV SOLN
5.0000 mg/kg | Freq: Once | INTRAVENOUS | Status: AC
  Administered 2024-03-10: 400 mg via INTRAVENOUS
  Filled 2024-03-10: qty 16

## 2024-03-10 MED ORDER — SODIUM CHLORIDE 0.9 % IV SOLN
1920.0000 mg/m2 | INTRAVENOUS | Status: DC
  Administered 2024-03-10: 3900 mg via INTRAVENOUS
  Filled 2024-03-10: qty 78

## 2024-03-10 MED ORDER — LEUCOVORIN CALCIUM INJECTION 350 MG
320.0000 mg/m2 | Freq: Once | INTRAVENOUS | Status: AC
  Administered 2024-03-10: 652 mg via INTRAVENOUS
  Filled 2024-03-10: qty 32.6

## 2024-03-10 MED ORDER — FLUOROURACIL CHEMO INJECTION 2.5 GM/50ML
320.0000 mg/m2 | Freq: Once | INTRAVENOUS | Status: AC
  Administered 2024-03-10: 650 mg via INTRAVENOUS
  Filled 2024-03-10: qty 13

## 2024-03-10 MED ORDER — OXALIPLATIN CHEMO INJECTION 100 MG/20ML
66.0000 mg/m2 | Freq: Once | INTRAVENOUS | Status: AC
  Administered 2024-03-10: 135 mg via INTRAVENOUS
  Filled 2024-03-10: qty 20

## 2024-03-10 MED ORDER — DEXAMETHASONE SODIUM PHOSPHATE 10 MG/ML IJ SOLN
10.0000 mg | Freq: Once | INTRAMUSCULAR | Status: AC
  Administered 2024-03-10: 10 mg via INTRAVENOUS
  Filled 2024-03-10: qty 1

## 2024-03-10 NOTE — Patient Instructions (Signed)
 Fluorouracil Injection What is this medication? FLUOROURACIL (flure oh YOOR a sil) treats some types of cancer. It works by slowing down the growth of cancer cells. This medicine may be used for other purposes; ask your health care provider or pharmacist if you have questions. COMMON BRAND NAME(S): Adrucil What should I tell my care team before I take this medication? They need to know if you have any of these conditions: Blood disorders Dihydropyrimidine dehydrogenase (DPD) deficiency Infection, such as chickenpox, cold sores, herpes Kidney disease Liver disease Poor nutrition Recent or ongoing radiation therapy An unusual or allergic reaction to fluorouracil, other medications, foods, dyes, or preservatives If you or your partner are pregnant or trying to get pregnant Breast-feeding How should I use this medication? This medication is injected into a vein. It is administered by your care team in a hospital or clinic setting. Talk to your care team about the use of this medication in children. Special care may be needed. Overdosage: If you think you have taken too much of this medicine contact a poison control center or emergency room at once. NOTE: This medicine is only for you. Do not share this medicine with others. What if I miss a dose? Keep appointments for follow-up doses. It is important not to miss your dose. Call your care team if you are unable to keep an appointment. What may interact with this medication? Do not take this medication with any of the following: Live virus vaccines This medication may also interact with the following: Medications that treat or prevent blood clots, such as warfarin, enoxaparin, dalteparin This list may not describe all possible interactions. Give your health care provider a list of all the medicines, herbs, non-prescription drugs, or dietary supplements you use. Also tell them if you smoke, drink alcohol, or use illegal drugs. Some items may  interact with your medicine. What should I watch for while using this medication? Your condition will be monitored carefully while you are receiving this medication. This medication may make you feel generally unwell. This is not uncommon as chemotherapy can affect healthy cells as well as cancer cells. Report any side effects. Continue your course of treatment even though you feel ill unless your care team tells you to stop. In some cases, you may be given additional medications to help with side effects. Follow all directions for their use. This medication may increase your risk of getting an infection. Call your care team for advice if you get a fever, chills, sore throat, or other symptoms of a cold or flu. Do not treat yourself. Try to avoid being around people who are sick. This medication may increase your risk to bruise or bleed. Call your care team if you notice any unusual bleeding. Be careful brushing or flossing your teeth or using a toothpick because you may get an infection or bleed more easily. If you have any dental work done, tell your dentist you are receiving this medication. Avoid taking medications that contain aspirin, acetaminophen, ibuprofen, naproxen, or ketoprofen unless instructed by your care team. These medications may hide a fever. Do not treat diarrhea with over the counter products. Contact your care team if you have diarrhea that lasts more than 2 days or if it is severe and watery. This medication can make you more sensitive to the sun. Keep out of the sun. If you cannot avoid being in the sun, wear protective clothing and sunscreen. Do not use sun lamps, tanning beds, or tanning booths. Talk to  your care team if you or your partner wish to become pregnant or think you might be pregnant. This medication can cause serious birth defects if taken during pregnancy and for 3 months after the last dose. A reliable form of contraception is recommended while taking this  medication and for 3 months after the last dose. Talk to your care team about effective forms of contraception. Do not father a child while taking this medication and for 3 months after the last dose. Use a condom while having sex during this time period. Do not breastfeed while taking this medication. This medication may cause infertility. Talk to your care team if you are concerned about your fertility. What side effects may I notice from receiving this medication? Side effects that you should report to your care team as soon as possible: Allergic reactions--skin rash, itching, hives, swelling of the face, lips, tongue, or throat Heart attack--pain or tightness in the chest, shoulders, arms, or jaw, nausea, shortness of breath, cold or clammy skin, feeling faint or lightheaded Heart failure--shortness of breath, swelling of the ankles, feet, or hands, sudden weight gain, unusual weakness or fatigue Heart rhythm changes--fast or irregular heartbeat, dizziness, feeling faint or lightheaded, chest pain, trouble breathing High ammonia level--unusual weakness or fatigue, confusion, loss of appetite, nausea, vomiting, seizures Infection--fever, chills, cough, sore throat, wounds that don't heal, pain or trouble when passing urine, general feeling of discomfort or being unwell Low red blood cell level--unusual weakness or fatigue, dizziness, headache, trouble breathing Pain, tingling, or numbness in the hands or feet, muscle weakness, change in vision, confusion or trouble speaking, loss of balance or coordination, trouble walking, seizures Redness, swelling, and blistering of the skin over hands and feet Severe or prolonged diarrhea Unusual bruising or bleeding Side effects that usually do not require medical attention (report to your care team if they continue or are bothersome): Dry skin Headache Increased tears Nausea Pain, redness, or swelling with sores inside the mouth or throat Sensitivity  to light Vomiting This list may not describe all possible side effects. Call your doctor for medical advice about side effects. You may report side effects to FDA at 1-800-FDA-1088. Where should I keep my medication? This medication is given in a hospital or clinic. It will not be stored at home. NOTE: This sheet is a summary. It may not cover all possible information. If you have questions about this medicine, talk to your doctor, pharmacist, or health care provider.  2024 Elsevier/Gold Standard (2021-12-05 00:00:00)Leucovorin Injection What is this medication? LEUCOVORIN (loo koe VOR in) prevents side effects from certain medications, such as methotrexate. It works by increasing folate levels. This helps protect healthy cells in your body. It may also be used to treat anemia caused by low levels of folate. It can also be used with fluorouracil, a type of chemotherapy, to treat colorectal cancer. It works by increasing the effects of fluorouracil in the body. This medicine may be used for other purposes; ask your health care provider or pharmacist if you have questions. What should I tell my care team before I take this medication? They need to know if you have any of these conditions: Anemia from low levels of vitamin B12 in the blood An unusual or allergic reaction to leucovorin, folic acid, other medications, foods, dyes, or preservatives Pregnant or trying to get pregnant Breastfeeding How should I use this medication? This medication is injected into a vein or a muscle. It is given by your care team in  a hospital or clinic setting. Talk to your care team about the use of this medication in children. Special care may be needed. Overdosage: If you think you have taken too much of this medicine contact a poison control center or emergency room at once. NOTE: This medicine is only for you. Do not share this medicine with others. What if I miss a dose? Keep appointments for follow-up doses.  It is important not to miss your dose. Call your care team if you are unable to keep an appointment. What may interact with this medication? Capecitabine Fluorouracil Phenobarbital Phenytoin Primidone Trimethoprim;sulfamethoxazole This list may not describe all possible interactions. Give your health care provider a list of all the medicines, herbs, non-prescription drugs, or dietary supplements you use. Also tell them if you smoke, drink alcohol, or use illegal drugs. Some items may interact with your medicine. What should I watch for while using this medication? Your condition will be monitored carefully while you are receiving this medication. This medication may increase the side effects of 5-fluorouracil. Tell your care team if you have diarrhea or mouth sores that do not get better or that get worse. What side effects may I notice from receiving this medication? Side effects that you should report to your care team as soon as possible: Allergic reactions--skin rash, itching, hives, swelling of the face, lips, tongue, or throat This list may not describe all possible side effects. Call your doctor for medical advice about side effects. You may report side effects to FDA at 1-800-FDA-1088. Where should I keep my medication? This medication is given in a hospital or clinic. It will not be stored at home. NOTE: This sheet is a summary. It may not cover all possible information. If you have questions about this medicine, talk to your doctor, pharmacist, or health care provider.  2024 Elsevier/Gold Standard (2022-01-02 00:00:00)Oxaliplatin Injection What is this medication? OXALIPLATIN (ox AL i PLA tin) treats colorectal cancer. It works by slowing down the growth of cancer cells. This medicine may be used for other purposes; ask your health care provider or pharmacist if you have questions. COMMON BRAND NAME(S): Eloxatin What should I tell my care team before I take this medication? They  need to know if you have any of these conditions: Heart disease History of irregular heartbeat or rhythm Liver disease Low blood cell levels (white cells, red cells, and platelets) Lung or breathing disease, such as asthma Take medications that treat or prevent blood clots Tingling of the fingers, toes, or other nerve disorder An unusual or allergic reaction to oxaliplatin, other medications, foods, dyes, or preservatives If you or your partner are pregnant or trying to get pregnant Breast-feeding How should I use this medication? This medication is injected into a vein. It is given by your care team in a hospital or clinic setting. Talk to your care team about the use of this medication in children. Special care may be needed. Overdosage: If you think you have taken too much of this medicine contact a poison control center or emergency room at once. NOTE: This medicine is only for you. Do not share this medicine with others. What if I miss a dose? Keep appointments for follow-up doses. It is important not to miss a dose. Call your care team if you are unable to keep an appointment. What may interact with this medication? Do not take this medication with any of the following: Cisapride Dronedarone Pimozide Thioridazine This medication may also interact with the following:  Aspirin and aspirin-like medications Certain medications that treat or prevent blood clots, such as warfarin, apixaban, dabigatran, and rivaroxaban Cisplatin Cyclosporine Diuretics Medications for infection, such as acyclovir, adefovir, amphotericin B, bacitracin, cidofovir, foscarnet, ganciclovir, gentamicin, pentamidine, vancomycin NSAIDs, medications for pain and inflammation, such as ibuprofen or naproxen Other medications that cause heart rhythm changes Pamidronate Zoledronic acid This list may not describe all possible interactions. Give your health care provider a list of all the medicines, herbs,  non-prescription drugs, or dietary supplements you use. Also tell them if you smoke, drink alcohol, or use illegal drugs. Some items may interact with your medicine. What should I watch for while using this medication? Your condition will be monitored carefully while you are receiving this medication. You may need blood work while taking this medication. This medication may make you feel generally unwell. This is not uncommon as chemotherapy can affect healthy cells as well as cancer cells. Report any side effects. Continue your course of treatment even though you feel ill unless your care team tells you to stop. This medication may increase your risk of getting an infection. Call your care team for advice if you get a fever, chills, sore throat, or other symptoms of a cold or flu. Do not treat yourself. Try to avoid being around people who are sick. Avoid taking medications that contain aspirin, acetaminophen, ibuprofen, naproxen, or ketoprofen unless instructed by your care team. These medications may hide a fever. Be careful brushing or flossing your teeth or using a toothpick because you may get an infection or bleed more easily. If you have any dental work done, tell your dentist you are receiving this medication. This medication can make you more sensitive to cold. Do not drink cold drinks or use ice. Cover exposed skin before coming in contact with cold temperatures or cold objects. When out in cold weather wear warm clothing and cover your mouth and nose to warm the air that goes into your lungs. Tell your care team if you get sensitive to the cold. Talk to your care team if you or your partner are pregnant or think either of you might be pregnant. This medication can cause serious birth defects if taken during pregnancy and for 9 months after the last dose. A negative pregnancy test is required before starting this medication. A reliable form of contraception is recommended while taking this  medication and for 9 months after the last dose. Talk to your care team about effective forms of contraception. Do not father a child while taking this medication and for 6 months after the last dose. Use a condom while having sex during this time period. Do not breastfeed while taking this medication and for 3 months after the last dose. This medication may cause infertility. Talk to your care team if you are concerned about your fertility. What side effects may I notice from receiving this medication? Side effects that you should report to your care team as soon as possible: Allergic reactions--skin rash, itching, hives, swelling of the face, lips, tongue, or throat Bleeding--bloody or black, tar-like stools, vomiting blood or brown material that looks like coffee grounds, red or dark brown urine, small red or purple spots on skin, unusual bruising or bleeding Dry cough, shortness of breath or trouble breathing Heart rhythm changes--fast or irregular heartbeat, dizziness, feeling faint or lightheaded, chest pain, trouble breathing Infection--fever, chills, cough, sore throat, wounds that don't heal, pain or trouble when passing urine, general feeling of discomfort or being unwell  Liver injury--right upper belly pain, loss of appetite, nausea, light-colored stool, dark yellow or brown urine, yellowing skin or eyes, unusual weakness or fatigue Low red blood cell level--unusual weakness or fatigue, dizziness, headache, trouble breathing Muscle injury--unusual weakness or fatigue, muscle pain, dark yellow or brown urine, decrease in amount of urine Pain, tingling, or numbness in the hands or feet Sudden and severe headache, confusion, change in vision, seizures, which may be signs of posterior reversible encephalopathy syndrome (PRES) Unusual bruising or bleeding Side effects that usually do not require medical attention (report to your care team if they continue or are  bothersome): Diarrhea Nausea Pain, redness, or swelling with sores inside the mouth or throat Unusual weakness or fatigue Vomiting This list may not describe all possible side effects. Call your doctor for medical advice about side effects. You may report side effects to FDA at 1-800-FDA-1088. Where should I keep my medication? This medication is given in a hospital or clinic. It will not be stored at home. NOTE: This sheet is a summary. It may not cover all possible information. If you have questions about this medicine, talk to your doctor, pharmacist, or health care provider.  2024 Elsevier/Gold Standard (2023-07-12 00:00:00)

## 2024-03-10 NOTE — Telephone Encounter (Signed)
 Patient has been scheduled for follow-up visit per 03/10/24 LOS.  Pt given an appt calendar with date and time.

## 2024-03-10 NOTE — Progress Notes (Cosign Needed)
 [facility]  [address] [place],  [place]  [address] [phone]  Clinic Day:  03/10/2024  Referring physician: Sherre Clapper, MD   HISTORY OF PRESENT ILLNESS:  The patient is a 73 y.o. male with  metastatic rectal cancer, including spread of disease to his liver.  Due to nausea and fatigue, the doses of chemotherapy were reduced by 20% with his 6th cycle. He had worsening anemia at that time.  He was found to be iron deficient, so received IV iron in the form of Monoferric .  He comes in today for repeat clinical assessment prior to a 7th cycle of FOLFOX/Avastin .  He states he tolerated his 6th cycle somewhat better.  However, he still reports frequent nausea and intermittent fatigue.  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  With respect to his metastatic rectal cancer, he denies having any new symptoms/findings which concern him for disease progression while on chemotherapy.    VITALS:   Blood pressure (!) 144/78, pulse 82, temperature 98.4 F (36.9 C), temperature source Oral, resp. rate 18, height 5' 9 (1.753 m), weight 178 lb 11.2 oz (81.1 kg), SpO2 100%. Wt Readings from Last 3 Encounters:  03/10/24 178 lb 11.2 oz (81.1 kg)  02/25/24 176 lb (79.8 kg)  02/18/24 178 lb 4.8 oz (80.9 kg)   Body mass index is 26.39 kg/m.  Performance status (ECOG): 1 - Symptomatic but completely ambulatory  PHYSICAL EXAM:   Physical Exam Vitals and nursing note reviewed.  Constitutional:      General: He is not in acute distress.    Appearance: Normal appearance. He is normal weight.  HENT:     Head: Normocephalic and atraumatic.     Mouth/Throat:     Mouth: Mucous membranes are moist.     Pharynx: Oropharynx is clear. No oropharyngeal exudate or posterior oropharyngeal erythema.  Eyes:     General: No scleral icterus.    Extraocular Movements: Extraocular movements intact.     Conjunctiva/sclera: Conjunctivae normal.     Pupils: Pupils are equal, round,  and reactive to light.  Cardiovascular:     Rate and Rhythm: Normal rate and regular rhythm.     Heart sounds: Normal heart sounds. No murmur heard.    No friction rub. No gallop.  Pulmonary:     Effort: Pulmonary effort is normal.     Breath sounds: Normal breath sounds. No wheezing, rhonchi or rales.  Abdominal:     General: Bowel sounds are normal. There is no distension.     Palpations: Abdomen is soft. There is no hepatomegaly, splenomegaly or mass.     Tenderness: There is no abdominal tenderness.  Musculoskeletal:        General: Normal range of motion.     Cervical back: Normal range of motion and neck supple. No tenderness.     Right lower leg: No edema.     Left lower leg: No edema.  Lymphadenopathy:     Cervical: No cervical adenopathy.     Upper Body:     Right upper body: No supraclavicular or axillary adenopathy.     Left upper body: No supraclavicular or axillary adenopathy.     Lower Body: No right inguinal adenopathy. No left inguinal adenopathy.  Skin:    General: Skin is warm and dry.     Coloration: Skin is not jaundiced.     Findings: No rash.  Neurological:     Mental Status: He is alert and  oriented to person, place, and time.     Cranial Nerves: No cranial nerve deficit.  Psychiatric:        Mood and Affect: Mood normal.        Behavior: Behavior normal.        Thought Content: Thought content normal.     LABS:      Latest Ref Rng & Units 03/10/2024    9:38 AM 02/25/2024   10:16 AM 02/06/2024    9:20 AM  CBC  WBC 4.0 - 10.5 K/uL 4.8  3.3  3.4   Hemoglobin 13.0 - 17.0 g/dL 89.8  9.5  9.3   Hematocrit 39.0 - 52.0 % 32.1  30.3  30.5   Platelets 150 - 400 K/uL 103  98  93       Latest Ref Rng & Units 02/25/2024   10:16 AM 02/06/2024    9:20 AM 01/28/2024    9:11 AM  CMP  Glucose 70 - 99 mg/dL 97  894  875   BUN 8 - 23 mg/dL 12  15  17    Creatinine 0.61 - 1.24 mg/dL 8.94  9.09  9.11   Sodium 135 - 145 mmol/L 143  137  136   Potassium 3.5 - 5.1  mmol/L 4.1  4.2  4.2   Chloride 98 - 111 mmol/L 107  104  104   CO2 22 - 32 mmol/L 25  24  24    Calcium  8.9 - 10.3 mg/dL 9.4  9.2  9.6   Total Protein 6.5 - 8.1 g/dL 6.9  6.5  6.6   Total Bilirubin 0.0 - 1.2 mg/dL 0.7  0.4  0.5   Alkaline Phos 38 - 126 U/L 63  57  61   AST 15 - 41 U/L 33  23  23   ALT 0 - 44 U/L 46  19  18      Lab Results  Component Value Date   CEA 7.04 (H) 02/06/2024   CEA 13.98 (H) 10/28/2023   /  CEA ([facility])  Date Value Ref Range Status  02/06/2024 7.04 (H) 0.00 - 5.00 ng/mL Final    Comment:    (NOTE) This test was performed using [employer] Coulter's paramagnetic chemiluminescent immunoassay. Values obtained from different assay methods cannot be used interchangeably. Please note that up to 8% of patients who smoke may see values 5.1-10.0 ng/ml and 1% of patients who smoke may see CEA levels >10.0 ng/ml. Performed at [facility], [address], [place], [place] [address]    Lab Results  Component Value Date   PSA1 0.3 06/04/2023    Lab Results  Component Value Date   TIBC 403 01/28/2024   FERRITIN 39 01/28/2024   IRONPCTSAT 13 (L) 01/28/2024   No results found for: LDH     Component Value Date/Time   CEA 7.04 (H) 02/06/2024 0920   PSA1 0.3 06/04/2023 0839    Review Flowsheet  More data exists      Latest Ref Rng & Units 10/28/2023 01/28/2024 02/06/2024  Oncology Labs  Ferritin 24 - 336 ng/mL - 39  -  %SAT 17.9 - 39.5 % - 13  -  CEA ([facility]) 0.00 - 5.00 ng/mL 13.98  - 7.04      STUDIES:   No results found.    ASSESSMENT & PLAN:   Assessment/Plan:  73 y.o. male with metastatic rectal cancer including spread of his disease to his liver. He is doing fairly well clinically.  He has  had some improvement in his hemoglobin with Monoferric . He will proceed with a 7th cycle of FOLFOX/Avastin  this week. I will plan to see him back in 2 weeks prior to an 8th cycle.  The patient and his wife understand all the plans  discussed today and are in agreement with them.  They know to contact our office if he develops concerns prior to his next appointment.     Andrez DELENA Foy, PA-C   Physician Assistant [facility] [place] [phone]

## 2024-03-12 ENCOUNTER — Inpatient Hospital Stay

## 2024-03-12 VITALS — BP 155/74 | HR 98 | Temp 98.6°F | Resp 18

## 2024-03-12 DIAGNOSIS — Z5111 Encounter for antineoplastic chemotherapy: Secondary | ICD-10-CM | POA: Diagnosis not present

## 2024-03-12 DIAGNOSIS — C20 Malignant neoplasm of rectum: Secondary | ICD-10-CM

## 2024-03-12 MED ORDER — HEPARIN SOD (PORK) LOCK FLUSH 100 UNIT/ML IV SOLN
500.0000 [IU] | Freq: Once | INTRAVENOUS | Status: AC | PRN
Start: 2024-03-12 — End: 2024-03-12
  Administered 2024-03-12: 500 [IU]

## 2024-03-12 MED ORDER — SODIUM CHLORIDE 0.9% FLUSH
10.0000 mL | INTRAVENOUS | Status: DC | PRN
Start: 2024-03-12 — End: 2024-03-12
  Administered 2024-03-12: 10 mL

## 2024-03-12 NOTE — Patient Instructions (Signed)
 Managing Chemotherapy Side Effects, Adult Chemotherapy is a treatment that uses medicine to kill cancer cells. However, in addition to killing cancer cells, the medicines can also damage healthy cells. The damage to healthy cells can lead to side effects. The exact side effects depend on the specific medicines used. Most of the side effects of chemotherapy go away once treatment is finished. Until then, work closely with your health care providers and take an active role in managing your side effects. What are common side effects of chemotherapy? Increased risk of infection, bruising, or bleeding. Nausea and vomiting. Constipation or diarrhea. Loss of appetite. Hair loss. Mouth or throat sores. Tiredness (fatigue). Tingling, pain, or numbness in the hands and feet. Dry, sensitive, itchy, or sore skin. Sleep disturbances, such as excessive sleepiness. Confusion, anxiety, or mood swings. Memory changes. How to manage the side effects of chemotherapy Medicines Take over-the-counter and prescription medicines only as told by your health care provider. Talk with your health care provider before taking vitamins, herbs, supplements, or over-the-counter medicines. Some of these can interfere with chemotherapy. Activity Get plenty of rest. Get regular exercise by doing activities such as walking, gentle yoga, or tai chi. Return to your normal activities as told by your health care provider. Ask your health care provider what activities are safe for you. Eating and drinking  Talk to a dietitian about what you should eat and drink during cancer treatment. Drink enough fluid to keep your urine pale yellow. If you have side effects that affect eating, these tips may help: Eat smaller meals and snacks often. Drink high-nutrition and high-calorie shakes or supplements. Choose bland and soft foods that are easy to eat. Do not eat foods that are hot, spicy, or hard to swallow. Do not eat raw or  undercooked meat, eggs, or seafood. Always wash fresh fruits and vegetables well before eating them. Skin care If you have sore or itchy skin: Wear soft, comfortable clothing. Apply creams and ointments to your skin as told by your health care provider. If you lose your hair, consider wearing a wig, hat, or scarf to cover your head. You may want to have someone shave your head as you start to lose hair. During outdoor activities, protect your head and skin from the sun by using sunscreen with an SPF of 30 or higher or by wearing protective clothing and a hat. Meet with a hair and skin care specialist for makeup and skin care tips. Apply sunscreen to your scalp as told by your health care provider. General tips Learn as much as you can about your condition. If you are struggling emotionally, talk with a mental health care provider or join a support group. Keep all follow-up visits. This is important. How to prevent infection and bleeding Chemotherapy may lower your blood counts and put you at risk for infection and bleeding. Here are some ways to help prevent problems. Vaccines Talk to your health care provider about vaccines. You should not get any live vaccines, such as the polio, MMR, chickenpox, and shingles vaccines until your health care provider says that it is safe to do so. Do not be around people who have had live vaccines for as long as your health care provider recommends. Make sure you get a yearly flu shot. People who will be near you should also get a yearly flu shot. Social activity Stay away from crowded places where you could be exposed to germs. Do not be around people who may be sick or  people who have symptoms of a fever until they have been fever-free for at least 24 hours. Do not share food, cups, straws, or utensils with other people. Wear a mask when outside the home if your blood counts are low. Cleanliness  Wash your hands often for at least 20 seconds. Also make  sure that other members of your household wash their hands often. Brush your teeth twice daily using a soft toothbrush. Use mouth rinse only as told by your health care provider. Take a bath or shower daily unless your health care provider gives different instructions. General tips Take your temperature regularly, especially if you have chills or feel warm. Check with your health care provider: Before you travel. Before you have a dental procedure. Before you use a swimming pool, hot tub, or swim in a lake or ocean. If you get chemotherapy through an IV or port, check the site every day for signs of infection. Check for redness, swelling, pain, fluid, and warmth. Avoid activities that put you at risk for injury or bleeding. Use an electric razor to shave instead of a blade. Questions to ask your health care provider What are the most common side effects of my treatment? How will they affect my daily life? What can I do to manage them? What are some possible long-term side effects? What are possible complications? What support services are available? What number can I call with questions or concerns? Where to find support Cancer affects the entire family. Find out what family support resources are available from your cancer treatment center. For more support, turn to: Your cancer care team. Friends and family. Your religious community. Other people with cancer. Community-based or online support groups. Where to find more information National Cancer Institute: www.cancer.gov American Cancer Society: www.cancer.org Contact a health care provider if: You bleed or bruise more often. You notice blood in your urine or stool. You have any of these symptoms: A skin rash, or dry or itchy skin. A headache or stiff neck. Cold or flu symptoms. A cough. Persistent nausea or vomiting. Persistent diarrhea. Frequent urination, burning when passing urine, or foul-smelling urine. You cannot eat  because of mouth or throat pain. You are sad, confused, anxious, or depressed. Get help right away if: You have any of these symptoms: A fever or chills. Your health care provider should know about this right away. Redness, swelling, pain, fluid, or warmth near an IV site. Bleeding that you cannot stop. A seizure. You cannot swallow. You have chest pain. You have trouble breathing. A family member or caregiver should get help right away if you have a sudden or unusual change in behavior. These symptoms may be an emergency. Get help right away. Call 911. Do not wait to see if the symptoms will go away. Do not drive yourself to the hospital. Summary Chemotherapy is a treatment that uses medicine to kill cancer cells and can cause side effects. The specific side effects depend on the specific medicines used. Learn as much as you can about your condition. Ask about side effects to watch for and how to treat them. Seek out support and resources from others. Find out what family support resources are available from your cancer treatment center. Let your health care provider know if you notice any new, unusual, or worsening symptoms, especially fever or chills. This information is not intended to replace advice given to you by your health care provider. Make sure you discuss any questions you have with your health  care provider. Document Revised: 07/20/2021 Document Reviewed: 07/20/2021 Elsevier Patient Education  2024 [employer].

## 2024-03-12 NOTE — Progress Notes (Signed)
 Patient arrived for pump d/c with tubing already detached from port tubing- Dead end connectors intact- flushed with NS, positive blood return- Port flush per routine.

## 2024-03-16 ENCOUNTER — Ambulatory Visit (INDEPENDENT_AMBULATORY_CARE_PROVIDER_SITE_OTHER): Admitting: Podiatry

## 2024-03-16 DIAGNOSIS — Z91199 Patient's noncompliance with other medical treatment and regimen due to unspecified reason: Secondary | ICD-10-CM

## 2024-03-18 DIAGNOSIS — C20 Malignant neoplasm of rectum: Secondary | ICD-10-CM | POA: Diagnosis not present

## 2024-03-18 NOTE — Progress Notes (Signed)
Patient did not show for scheduled appointment today.

## 2024-03-24 ENCOUNTER — Inpatient Hospital Stay: Admitting: Hematology and Oncology

## 2024-03-24 ENCOUNTER — Inpatient Hospital Stay: Attending: Oncology

## 2024-03-24 ENCOUNTER — Other Ambulatory Visit: Payer: Self-pay

## 2024-03-24 ENCOUNTER — Inpatient Hospital Stay

## 2024-03-24 VITALS — BP 140/79 | HR 81 | Temp 98.9°F | Resp 18 | Ht 69.0 in | Wt 171.9 lb

## 2024-03-24 DIAGNOSIS — Z79899 Other long term (current) drug therapy: Secondary | ICD-10-CM | POA: Insufficient documentation

## 2024-03-24 DIAGNOSIS — C787 Secondary malignant neoplasm of liver and intrahepatic bile duct: Secondary | ICD-10-CM

## 2024-03-24 DIAGNOSIS — Z5111 Encounter for antineoplastic chemotherapy: Secondary | ICD-10-CM | POA: Diagnosis not present

## 2024-03-24 DIAGNOSIS — C20 Malignant neoplasm of rectum: Secondary | ICD-10-CM

## 2024-03-24 DIAGNOSIS — D6481 Anemia due to antineoplastic chemotherapy: Secondary | ICD-10-CM | POA: Insufficient documentation

## 2024-03-24 LAB — CBC WITH DIFFERENTIAL (CANCER CENTER ONLY)
Abs Immature Granulocytes: 0.01 K/uL (ref 0.00–0.07)
Basophils Absolute: 0 K/uL (ref 0.0–0.1)
Basophils Relative: 0 %
Eosinophils Absolute: 0.1 K/uL (ref 0.0–0.5)
Eosinophils Relative: 2 %
HCT: 31.4 % — ABNORMAL LOW (ref 39.0–52.0)
Hemoglobin: 10 g/dL — ABNORMAL LOW (ref 13.0–17.0)
Immature Granulocytes: 0 %
Lymphocytes Relative: 22 %
Lymphs Abs: 1 K/uL (ref 0.7–4.0)
MCH: 28.3 pg (ref 26.0–34.0)
MCHC: 31.8 g/dL (ref 30.0–36.0)
MCV: 89 fL (ref 80.0–100.0)
Monocytes Absolute: 0.5 K/uL (ref 0.1–1.0)
Monocytes Relative: 10 %
Neutro Abs: 3.1 K/uL (ref 1.7–7.7)
Neutrophils Relative %: 66 %
Platelet Count: 110 K/uL — ABNORMAL LOW (ref 150–400)
RBC: 3.53 MIL/uL — ABNORMAL LOW (ref 4.22–5.81)
RDW: 20.8 % — ABNORMAL HIGH (ref 11.5–15.5)
WBC Count: 4.6 K/uL (ref 4.0–10.5)
nRBC: 0 % (ref 0.0–0.2)

## 2024-03-24 LAB — CMP (CANCER CENTER ONLY)
ALT: 17 U/L (ref 0–44)
AST: 29 U/L (ref 15–41)
Albumin: 4.2 g/dL (ref 3.5–5.0)
Alkaline Phosphatase: 57 U/L (ref 38–126)
Anion gap: 10 (ref 5–15)
BUN: 12 mg/dL (ref 8–23)
CO2: 24 mmol/L (ref 22–32)
Calcium: 9.6 mg/dL (ref 8.9–10.3)
Chloride: 106 mmol/L (ref 98–111)
Creatinine: 0.63 mg/dL (ref 0.61–1.24)
GFR, Estimated: >60 mL/min (ref >60)
Glucose, Bld: 125 mg/dL — ABNORMAL HIGH (ref 70–99)
Potassium: 4.1 mmol/L (ref 3.5–5.1)
Sodium: 140 mmol/L (ref 135–145)
Total Bilirubin: 0.7 mg/dL (ref 0.0–1.2)
Total Protein: 6.8 g/dL (ref 6.5–8.1)

## 2024-03-24 MED ORDER — SODIUM CHLORIDE 0.9 % IV SOLN
5.0000 mg/kg | Freq: Once | INTRAVENOUS | Status: AC
  Administered 2024-03-24 (×2): 400 mg via INTRAVENOUS
  Filled 2024-03-24: qty 16

## 2024-03-24 MED ORDER — OXALIPLATIN CHEMO INJECTION 50 MG/10ML
66.0000 mg/m2 | Freq: Once | INTRAVENOUS | Status: AC
  Administered 2024-03-24 (×2): 135 mg via INTRAVENOUS
  Filled 2024-03-24: qty 27

## 2024-03-24 MED ORDER — PALONOSETRON HCL INJECTION 0.25 MG/5ML
0.2500 mg | Freq: Once | INTRAVENOUS | Status: AC
  Administered 2024-03-24 (×2): 0.25 mg via INTRAVENOUS
  Filled 2024-03-24: qty 5

## 2024-03-24 MED ORDER — DEXAMETHASONE SODIUM PHOSPHATE 10 MG/ML IJ SOLN
10.0000 mg | Freq: Once | INTRAMUSCULAR | Status: AC
  Administered 2024-03-24 (×2): 10 mg via INTRAVENOUS
  Filled 2024-03-24: qty 1

## 2024-03-24 MED ORDER — LEUCOVORIN CALCIUM INJECTION 350 MG
320.0000 mg/m2 | Freq: Once | INTRAVENOUS | Status: AC
  Administered 2024-03-24 (×2): 652 mg via INTRAVENOUS
  Filled 2024-03-24: qty 32.6

## 2024-03-24 MED ORDER — DEXTROSE 5 % IV SOLN: INTRAVENOUS | Status: DC

## 2024-03-24 MED ORDER — SODIUM CHLORIDE 0.9 % IV SOLN: INTRAVENOUS | Status: DC

## 2024-03-24 MED ORDER — SODIUM CHLORIDE 0.9 % IV SOLN
1920.0000 mg/m2 | INTRAVENOUS | Status: DC
  Administered 2024-03-24 (×2): 3900 mg via INTRAVENOUS
  Filled 2024-03-24: qty 78

## 2024-03-24 MED ORDER — FLUOROURACIL CHEMO INJECTION 2.5 GM/50ML
320.0000 mg/m2 | Freq: Once | INTRAVENOUS | Status: AC
  Administered 2024-03-24 (×2): 650 mg via INTRAVENOUS
  Filled 2024-03-24: qty 13

## 2024-03-24 MED ORDER — SODIUM CHLORIDE 0.9% FLUSH: 10.0000 mL | INTRAVENOUS | Status: DC | PRN

## 2024-03-24 NOTE — Progress Notes (Signed)
 [facility]  [address] [place],  [place]  [address] [phone]  Clinic Day:  03/24/2024  Referring physician: Sherre Clapper, MD   HISTORY OF PRESENT ILLNESS:  The patient is a 73 y.o. male with  metastatic rectal cancer, including spread of disease to his liver.  Due to nausea and fatigue, the doses of chemotherapy were reduced by 20% with his 6th cycle. He had worsening anemia at that time.  He was found to be iron deficient, so received IV iron in the form of Monoferric .  He comes in today for repeat clinical assessment prior to a 7th cycle of FOLFOX/Avastin .  He states he tolerated his 7th cycle fair. He had some increased nausea and vomiting; although, he states this was only 24 hours and he attributes it to a possible GI bug. He still reports frequent nausea and intermittent fatigue.  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  With respect to his metastatic rectal cancer, he denies having any new symptoms/findings which concern him for disease progression while on chemotherapy.    VITALS:   Blood pressure (!) 140/79, pulse 81, temperature 98.9 F (37.2 C), temperature source Oral, resp. rate 18, height 5' 9 (1.753 m), weight 171 lb 14.4 oz (78 kg), SpO2 99%. Wt Readings from Last 3 Encounters:  03/24/24 171 lb 14.4 oz (78 kg)  03/10/24 178 lb 11.2 oz (81.1 kg)  02/25/24 176 lb (79.8 kg)   Body mass index is 25.39 kg/m.  Performance status (ECOG): 1 - Symptomatic but completely ambulatory  PHYSICAL EXAM:   Physical Exam Vitals and nursing note reviewed.  Constitutional:      General: He is not in acute distress.    Appearance: Normal appearance. He is normal weight.  HENT:     Head: Normocephalic and atraumatic.     Mouth/Throat:     Mouth: Mucous membranes are moist.     Pharynx: Oropharynx is clear. No oropharyngeal exudate or posterior oropharyngeal erythema.  Eyes:     General: No scleral icterus.    Extraocular Movements:  Extraocular movements intact.     Conjunctiva/sclera: Conjunctivae normal.     Pupils: Pupils are equal, round, and reactive to light.  Cardiovascular:     Rate and Rhythm: Normal rate and regular rhythm.     Heart sounds: Normal heart sounds. No murmur heard.    No friction rub. No gallop.  Pulmonary:     Effort: Pulmonary effort is normal.     Breath sounds: Normal breath sounds. No wheezing, rhonchi or rales.  Abdominal:     General: Bowel sounds are normal. There is no distension.     Palpations: Abdomen is soft. There is no hepatomegaly, splenomegaly or mass.     Tenderness: There is no abdominal tenderness.  Musculoskeletal:        General: Normal range of motion.     Cervical back: Normal range of motion and neck supple. No tenderness.     Right lower leg: No edema.     Left lower leg: No edema.  Lymphadenopathy:     Cervical: No cervical adenopathy.     Upper Body:     Right upper body: No supraclavicular or axillary adenopathy.     Left upper body: No supraclavicular or axillary adenopathy.     Lower Body: No right inguinal adenopathy. No left inguinal adenopathy.  Skin:    General: Skin is warm and dry.     Coloration: Skin is  not jaundiced.     Findings: No rash.  Neurological:     Mental Status: He is alert and oriented to person, place, and time.     Cranial Nerves: No cranial nerve deficit.  Psychiatric:        Mood and Affect: Mood normal.        Behavior: Behavior normal.        Thought Content: Thought content normal.      LABS:      Latest Ref Rng & Units 03/24/2024   10:38 AM 03/10/2024    9:38 AM 02/25/2024   10:16 AM  CBC  WBC 4.0 - 10.5 K/uL 4.6  4.8  3.3   Hemoglobin 13.0 - 17.0 g/dL 89.9  89.8  9.5   Hematocrit 39.0 - 52.0 % 31.4  32.1  30.3   Platelets 150 - 400 K/uL 110  103  98       Latest Ref Rng & Units 03/24/2024   10:38 AM 03/10/2024    9:38 AM 02/25/2024   10:16 AM  CMP  Glucose 70 - 99 mg/dL 874  892  97   BUN 8 - 23 mg/dL 12  11   12    Creatinine 0.61 - 1.24 mg/dL 9.36  8.95  8.94   Sodium 135 - 145 mmol/L 140  140  143   Potassium 3.5 - 5.1 mmol/L 4.1  4.3  4.1   Chloride 98 - 111 mmol/L 106  105  107   CO2 22 - 32 mmol/L 24  24  25    Calcium  8.9 - 10.3 mg/dL 9.6  9.2  9.4   Total Protein 6.5 - 8.1 g/dL 6.8  6.6  6.9   Total Bilirubin 0.0 - 1.2 mg/dL 0.7  0.7  0.7   Alkaline Phos 38 - 126 U/L 57  63  63   AST 15 - 41 U/L 29  27  33   ALT 0 - 44 U/L 17  23  46      Lab Results  Component Value Date   CEA 7.04 (H) 02/06/2024   CEA 13.98 (H) 10/28/2023   /  CEA ([facility])  Date Value Ref Range Status  02/06/2024 7.04 (H) 0.00 - 5.00 ng/mL Final    Comment:    (NOTE) This test was performed using [employer] Coulter's paramagnetic chemiluminescent immunoassay. Values obtained from different assay methods cannot be used interchangeably. Please note that up to 8% of patients who smoke may see values 5.1-10.0 ng/ml and 1% of patients who smoke may see CEA levels >10.0 ng/ml. Performed at [facility], [address], [place], [place] [address]    Lab Results  Component Value Date   PSA1 0.3 06/04/2023    Lab Results  Component Value Date   TIBC 403 01/28/2024   FERRITIN 39 01/28/2024   IRONPCTSAT 13 (L) 01/28/2024   No results found for: LDH     Component Value Date/Time   CEA 7.04 (H) 02/06/2024 0920   PSA1 0.3 06/04/2023 0839    Review Flowsheet  More data exists      Latest Ref Rng & Units 10/28/2023 01/28/2024 02/06/2024  Oncology Labs  Ferritin 24 - 336 ng/mL - 39  -  %SAT 17.9 - 39.5 % - 13  -  CEA ([facility]) 0.00 - 5.00 ng/mL 13.98  - 7.04      STUDIES:   No results found.    ASSESSMENT & PLAN:   Assessment/Plan:  73 y.o. male with  metastatic rectal cancer including spread of his disease to his liver. He is doing fairly well clinically.  He has had some improvement in his hemoglobin with Monoferric . He will proceed with an 8th cycle of FOLFOX/Avastin  this week. I  will plan to see him back in 2 weeks prior to a 9th cycle.  The patient and his wife understand all the plans discussed today and are in agreement with them.  They know to contact our office if he develops concerns prior to his next appointment.     Eleanor DELENA Bach, NP   Physician Assistant [facility] [phone]

## 2024-03-24 NOTE — Patient Instructions (Signed)
 The chemotherapy medication bag should finish at 46 hours. For example, if your pump is scheduled for 46 hours and it was put on at 4:00 p.m., it should finish at 2:00 p.m. the day it is scheduled to come off regardless of your appointment time.     Estimated time to finish at 1400 .   If the display on your pump reads Low Volume and it is beeping, take the batteries out of the pump and come to the cancer center for it to be taken off.   If the pump alarms go off prior to the pump reading Low Volume then call [phone] and someone can assist you.  If the plunger comes out and the chemotherapy medication is leaking out, please use your home chemo spill kit to clean up the spill. Do NOT use paper towels or other household products.  If you have problems or questions regarding your pump, please call either [phone] (24 hours a day) or the cancer center Monday-Friday 8:00 a.m.- 4:30 p.m. at the clinic number and we will assist you. If you are unable to get assistance, then go to the nearest Emergency Department and ask the staff to contact the IV team for assistance.

## 2024-03-26 ENCOUNTER — Inpatient Hospital Stay

## 2024-03-26 ENCOUNTER — Ambulatory Visit: Payer: PPO

## 2024-03-26 NOTE — Patient Instructions (Signed)
 [facility] - A DEPT OF [facility]  Discharge Instructions: Thank you for choosing [facility] to provide your oncology and hematology care.  If you have a lab appointment with the Cancer Center, please go directly to the Cancer Center and check in at the registration area.   Wear comfortable clothing and clothing appropriate for easy access to any Portacath or PICC line.   We strive to give you quality time with your provider. You may need to reschedule your appointment if you arrive late (15 or more minutes).  Arriving late affects you and other patients whose appointments are after yours.  Also, if you miss three or more appointments without notifying the office, you may be dismissed from the clinic at the provider's discretion.      For prescription refill requests, have your pharmacy contact our office and allow 72 hours for refills to be completed.    Today you received the following chemotherapy and/or immunotherapy agents    To help prevent nausea and vomiting after your treatment, we encourage you to take your nausea medication as directed.  BELOW ARE SYMPTOMS THAT SHOULD BE REPORTED IMMEDIATELY: *FEVER GREATER THAN 100.4 F (38 C) OR HIGHER *CHILLS OR SWEATING *NAUSEA AND VOMITING THAT IS NOT CONTROLLED WITH YOUR NAUSEA MEDICATION *UNUSUAL SHORTNESS OF BREATH *UNUSUAL BRUISING OR BLEEDING *URINARY PROBLEMS (pain or burning when urinating, or frequent urination) *BOWEL PROBLEMS (unusual diarrhea, constipation, pain near the anus) TENDERNESS IN MOUTH AND THROAT WITH OR WITHOUT PRESENCE OF ULCERS (sore throat, sores in mouth, or a toothache) UNUSUAL RASH, SWELLING OR PAIN  UNUSUAL VAGINAL DISCHARGE OR ITCHING   Items with * indicate a potential emergency and should be followed up as soon as possible or go to the Emergency Department if any problems should occur.  Please show the CHEMOTHERAPY ALERT CARD or IMMUNOTHERAPY ALERT CARD at  check-in to the Emergency Department and triage nurse.  Should you have questions after your visit or need to cancel or reschedule your appointment, please contact [facility]  - A DEPT OF [facility]  Dept: [phone]  and follow the prompts.  Office hours are 8:00 a.m. to 4:30 p.m. Monday - Friday. Please note that voicemails left after 4:00 p.m. may not be returned until the following business day.  We are closed weekends and major holidays. You have access to a nurse at all times for urgent questions. Please call the main number to the clinic Dept: [phone] and follow the prompts.  For any non-urgent questions, you may also contact your provider using MyChart. We now offer e-Visits for anyone 55 and older to request care online for non-urgent symptoms. For details visit [email or web address].   Also download the MyChart app! Go to the app store, search MyChart, open the app, select [facility], and log in with your MyChart username and password.

## 2024-03-27 ENCOUNTER — Other Ambulatory Visit: Payer: Self-pay

## 2024-04-02 NOTE — Progress Notes (Signed)
   04/02/2024  Patient ID: Joshua Nelson, male   DOB: November 08, 1950, 73 y.o.   MRN: [id number]  Pharmacy Quality Measure Review  This patient is appearing on a report for being at risk of failing the adherence measure for diabetes medications this calendar year.   Medication: farxiga  10mg   Last fill date: 03/15/2024 30 day supply at [facility].   Insurance report was not up to date. No action needed at this time.   Lang Sieve, PharmD, BCGP Clinical Pharmacist  [phone]

## 2024-04-03 ENCOUNTER — Other Ambulatory Visit: Payer: Self-pay | Admitting: Family Medicine

## 2024-04-03 ENCOUNTER — Ambulatory Visit: Payer: Self-pay

## 2024-04-03 DIAGNOSIS — E1142 Type 2 diabetes mellitus with diabetic polyneuropathy: Secondary | ICD-10-CM

## 2024-04-03 MED ORDER — FREESTYLE LIBRE 2 SENSOR MISC: 5 refills | Status: DC

## 2024-04-03 NOTE — Telephone Encounter (Signed)
 FYI Only or Action Required?: Action required by provider: dme request.  Patient was last seen in primary care on 01/14/2024 by Sherre Clapper, MD.  Called Nurse Triage reporting DME.  Symptoms began yesterday.  Interventions attempted: Nothing.  Symptoms are: na.  Triage Disposition: Call PCP When Office is Open  Patient/caregiver understands and will follow disposition?: Yes  Copied from CRM #[id number]. Topic: Clinical - Red Word Triage >> Apr 03, 2024 10:12 AM Charlet HERO wrote: Red Word that prompted transfer to Nurse Triage: Patient wife is calling about him loosing his sensor to test blood sugar, monitor, she is wanting to know how she can get it replaced. Reason for Disposition  [1] Prescription refill request for NON-ESSENTIAL medicine (i.e., no harm to patient if med not taken) AND [2] triager unable to refill per department policy  Answer Assessment - Initial Assessment Questions 1. REASON FOR CALL or QUESTION: What is your reason for calling today? or How can I best     Need replacement freestyle Libre 2 sensor. Missing sensor for one day. They would like an order for replacement and/or a sample in mean time. He does not do finger sticks.  See refill encounter.   Please call family if sample is available in office to pick up.  Answer Assessment - Initial Assessment Questions 1. DRUG NAME: What medicine do you need to have refilled?     DME-Freestyle Libre 2 Sensor 2. REFILLS REMAINING: How many refills are remaining? Notes: The label on the medicine or pill bottle will show how many refills are remaining. If there are no refills remaining, then a renewal may be needed.     Zero-equipment is lost  Protocols used: PCP Call - No Triage-A-AH, Medication Refill and Renewal Call-A-AH

## 2024-04-03 NOTE — Telephone Encounter (Signed)
 Left voicemail that sensor order was sent to pharmacy

## 2024-04-03 NOTE — Telephone Encounter (Signed)
 Sent to pharmacy just now, as pharmacy requested a refill.

## 2024-04-06 NOTE — Progress Notes (Unsigned)
 [facility]  [address] [place],  [place]  [address] [phone]  Clinic Day:  04/07/2024  Referring physician: Sherre Clapper, MD   HISTORY OF PRESENT ILLNESS:  The patient is a 73 y.o. male with  metastatic rectal cancer, including spread of disease to his liver.  He comes in today to be evaluated before heading into his 9th cycle of FOLFOX/Avastin .  He states he tolerated his 8th cycle of chemotherapy fairly well. 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  He did have a few more bouts of nausea with his last cycle of treatment.  However, he has not been taking his prescribed nausea medicine as frequently as it has been prescribed for his acute episodes of nausea.   With respect to his metastatic rectal cancer, he denies having any new symptoms/findings which concern him for disease progression while on his current chemotherapy.    VITALS:  Blood pressure (!) 146/78, pulse 73, temperature 98.1 F (36.7 C), temperature source Oral, resp. rate 16, height 5' 9 (1.753 m), weight 170 lb 8 oz (77.3 kg), SpO2 100%. Wt Readings from Last 3 Encounters:  04/07/24 170 lb 8 oz (77.3 kg)  03/24/24 171 lb 14.4 oz (78 kg)  03/10/24 178 lb 11.2 oz (81.1 kg)   Body mass index is 25.18 kg/m.  Performance status (ECOG): 1 - Symptomatic but completely ambulatory  PHYSICAL EXAM:   Physical Exam Vitals and nursing note reviewed.  Constitutional:      General: He is not in acute distress.    Appearance: Normal appearance. He is normal weight.  HENT:     Head: Normocephalic and atraumatic.     Mouth/Throat:     Mouth: Mucous membranes are moist.     Pharynx: Oropharynx is clear. No oropharyngeal exudate or posterior oropharyngeal erythema.  Eyes:     General: No scleral icterus.    Extraocular Movements: Extraocular movements intact.     Conjunctiva/sclera: Conjunctivae normal.     Pupils: Pupils are equal, round, and reactive to light.  Cardiovascular:     Rate  and Rhythm: Normal rate and regular rhythm.     Heart sounds: Normal heart sounds. No murmur heard.    No friction rub. No gallop.  Pulmonary:     Effort: Pulmonary effort is normal.     Breath sounds: Normal breath sounds. No wheezing, rhonchi or rales.  Abdominal:     General: Bowel sounds are normal. There is no distension.     Palpations: Abdomen is soft. There is no hepatomegaly, splenomegaly or mass.     Tenderness: There is no abdominal tenderness.  Musculoskeletal:        General: Normal range of motion.     Cervical back: Normal range of motion and neck supple. No tenderness.     Right lower leg: No edema.     Left lower leg: No edema.  Lymphadenopathy:     Cervical: No cervical adenopathy.     Upper Body:     Right upper body: No supraclavicular or axillary adenopathy.     Left upper body: No supraclavicular or axillary adenopathy.     Lower Body: No right inguinal adenopathy. No left inguinal adenopathy.  Skin:    General: Skin is warm and dry.     Coloration: Skin is not jaundiced.     Findings: No rash.  Neurological:     Mental Status: He is alert and oriented to person, place, and time.  Cranial Nerves: No cranial nerve deficit.  Psychiatric:        Mood and Affect: Mood normal.        Behavior: Behavior normal.        Thought Content: Thought content normal.    LABS:      Latest Ref Rng & Units 04/07/2024    8:55 AM 03/24/2024   10:38 AM 03/10/2024    9:38 AM  CBC  WBC 4.0 - 10.5 K/uL 4.6  4.6  4.8   Hemoglobin 13.0 - 17.0 g/dL 9.8  89.9  89.8   Hematocrit 39.0 - 52.0 % 30.7  31.4  32.1   Platelets 150 - 400 K/uL 85  110  103       Latest Ref Rng & Units 04/07/2024    8:55 AM 03/24/2024   10:38 AM 03/10/2024    9:38 AM  CMP  Glucose 70 - 99 mg/dL 890  874  892   BUN 8 - 23 mg/dL 14  12  11    Creatinine 0.61 - 1.24 mg/dL 9.39  9.36  8.95   Sodium 135 - 145 mmol/L 139  140  140   Potassium 3.5 - 5.1 mmol/L 4.2  4.1  4.3   Chloride 98 - 111 mmol/L  104  106  105   CO2 22 - 32 mmol/L 24  24  24    Calcium  8.9 - 10.3 mg/dL 9.5  9.6  9.2   Total Protein 6.5 - 8.1 g/dL 6.9  6.8  6.6   Total Bilirubin 0.0 - 1.2 mg/dL 0.8  0.7  0.7   Alkaline Phos 38 - 126 U/L 52  57  63   AST 15 - 41 U/L 24  29  27    ALT 0 - 44 U/L 15  17  23       Lab Results  Component Value Date   CEA 7.04 (H) 02/06/2024   CEA 13.98 (H) 10/28/2023    ASSESSMENT & PLAN:  Assessment/Plan:  73 y.o. male with metastatic rectal cancer including spread of his disease to his liver. He will proceed with his 9th cycle of FOLFOX/Avastin  today.  As his hemoglobin is less than 10 today, he will receive Retacrit  20,000 units.  I stressed to him the importance of using his nausea medicine more regularly during those times when he knows that his nausea will be more prominent, which is usually the first 2-3 days after each cycle of chemotherapy.  Overall, he appears to be doing fairly well.  I will see him back in 2 weeks before he heads into his 10th cycle of palliative FOLFOX/Avastin  chemotherapy.  The patient understands all the plans discussed today and is in agreement with them.  Randie Bloodgood DELENA Kerns, MD

## 2024-04-07 ENCOUNTER — Other Ambulatory Visit: Payer: Self-pay | Admitting: Pharmacist

## 2024-04-07 ENCOUNTER — Inpatient Hospital Stay

## 2024-04-07 ENCOUNTER — Inpatient Hospital Stay (HOSPITAL_BASED_OUTPATIENT_CLINIC_OR_DEPARTMENT_OTHER): Admitting: Oncology

## 2024-04-07 ENCOUNTER — Encounter: Payer: Self-pay | Admitting: Oncology

## 2024-04-07 VITALS — BP 146/78 | HR 73 | Temp 98.1°F | Resp 16 | Ht 69.0 in | Wt 170.5 lb

## 2024-04-07 DIAGNOSIS — C20 Malignant neoplasm of rectum: Secondary | ICD-10-CM

## 2024-04-07 DIAGNOSIS — Z452 Encounter for adjustment and management of vascular access device: Secondary | ICD-10-CM

## 2024-04-07 DIAGNOSIS — Z5111 Encounter for antineoplastic chemotherapy: Secondary | ICD-10-CM | POA: Diagnosis not present

## 2024-04-07 DIAGNOSIS — C787 Secondary malignant neoplasm of liver and intrahepatic bile duct: Secondary | ICD-10-CM

## 2024-04-07 LAB — CBC WITH DIFFERENTIAL (CANCER CENTER ONLY)
Abs Immature Granulocytes: 0.01 K/uL (ref 0.00–0.07)
Basophils Absolute: 0 K/uL (ref 0.0–0.1)
Basophils Relative: 0 %
Eosinophils Absolute: 0.1 K/uL (ref 0.0–0.5)
Eosinophils Relative: 1 %
HCT: 30.7 % — ABNORMAL LOW (ref 39.0–52.0)
Hemoglobin: 9.8 g/dL — ABNORMAL LOW (ref 13.0–17.0)
Immature Granulocytes: 0 %
Lymphocytes Relative: 20 %
Lymphs Abs: 0.9 K/uL (ref 0.7–4.0)
MCH: 29 pg (ref 26.0–34.0)
MCHC: 31.9 g/dL (ref 30.0–36.0)
MCV: 90.8 fL (ref 80.0–100.0)
Monocytes Absolute: 0.4 K/uL (ref 0.1–1.0)
Monocytes Relative: 9 %
Neutro Abs: 3.2 K/uL (ref 1.7–7.7)
Neutrophils Relative %: 70 %
Platelet Count: 85 K/uL — ABNORMAL LOW (ref 150–400)
RBC: 3.38 MIL/uL — ABNORMAL LOW (ref 4.22–5.81)
RDW: 19.9 % — ABNORMAL HIGH (ref 11.5–15.5)
WBC Count: 4.6 K/uL (ref 4.0–10.5)
nRBC: 0 % (ref 0.0–0.2)

## 2024-04-07 LAB — CMP (CANCER CENTER ONLY)
ALT: 15 U/L (ref 0–44)
AST: 24 U/L (ref 15–41)
Albumin: 4.2 g/dL (ref 3.5–5.0)
Alkaline Phosphatase: 52 U/L (ref 38–126)
Anion gap: 11 (ref 5–15)
BUN: 14 mg/dL (ref 8–23)
CO2: 24 mmol/L (ref 22–32)
Calcium: 9.5 mg/dL (ref 8.9–10.3)
Chloride: 104 mmol/L (ref 98–111)
Creatinine: 0.6 mg/dL — ABNORMAL LOW (ref 0.61–1.24)
GFR, Estimated: >60 mL/min (ref >60)
Glucose, Bld: 109 mg/dL — ABNORMAL HIGH (ref 70–99)
Potassium: 4.2 mmol/L (ref 3.5–5.1)
Sodium: 139 mmol/L (ref 135–145)
Total Bilirubin: 0.8 mg/dL (ref 0.0–1.2)
Total Protein: 6.9 g/dL (ref 6.5–8.1)

## 2024-04-07 LAB — TOTAL PROTEIN, URINE DIPSTICK: Protein, ur: NEGATIVE mg/dL

## 2024-04-07 MED ORDER — DEXTROSE 5 % IV SOLN: INTRAVENOUS | Status: DC

## 2024-04-07 MED ORDER — OXALIPLATIN CHEMO INJECTION 50 MG/10ML
66.0000 mg/m2 | Freq: Once | INTRAVENOUS | Status: AC
  Administered 2024-04-07: 135 mg via INTRAVENOUS
  Filled 2024-04-07: qty 20

## 2024-04-07 MED ORDER — SODIUM CHLORIDE 0.9 % IV SOLN
5.0000 mg/kg | Freq: Once | INTRAVENOUS | Status: AC
  Administered 2024-04-07: 400 mg via INTRAVENOUS
  Filled 2024-04-07: qty 16

## 2024-04-07 MED ORDER — EPOETIN ALFA-EPBX 20000 UNIT/ML IJ SOLN
20000.0000 [IU] | Freq: Once | INTRAMUSCULAR | Status: AC
  Administered 2024-04-07: 20000 [IU] via SUBCUTANEOUS
  Filled 2024-04-07: qty 1

## 2024-04-07 MED ORDER — DIPHENHYDRAMINE HCL 50 MG/ML IJ SOLN
25.0000 mg | Freq: Once | INTRAMUSCULAR | Status: AC
  Administered 2024-04-07: 25 mg via INTRAVENOUS
  Filled 2024-04-07: qty 1

## 2024-04-07 MED ORDER — DEXAMETHASONE SODIUM PHOSPHATE 10 MG/ML IJ SOLN
10.0000 mg | Freq: Once | INTRAMUSCULAR | Status: AC
  Administered 2024-04-07: 10 mg via INTRAVENOUS
  Filled 2024-04-07: qty 1

## 2024-04-07 MED ORDER — LEUCOVORIN CALCIUM INJECTION 350 MG
320.0000 mg/m2 | Freq: Once | INTRAVENOUS | Status: AC
  Administered 2024-04-07: 652 mg via INTRAVENOUS
  Filled 2024-04-07: qty 32.6

## 2024-04-07 MED ORDER — FLUOROURACIL CHEMO INJECTION 2.5 GM/50ML
320.0000 mg/m2 | Freq: Once | INTRAVENOUS | Status: AC
  Administered 2024-04-07: 650 mg via INTRAVENOUS
  Filled 2024-04-07: qty 13

## 2024-04-07 MED ORDER — SODIUM CHLORIDE 0.9 % IV SOLN
INTRAVENOUS | Status: DC
Start: 2024-04-07 — End: 2024-04-07

## 2024-04-07 MED ORDER — PALONOSETRON HCL INJECTION 0.25 MG/5ML
0.2500 mg | Freq: Once | INTRAVENOUS | Status: AC
  Administered 2024-04-07: 0.25 mg via INTRAVENOUS
  Filled 2024-04-07: qty 5

## 2024-04-07 MED ORDER — SODIUM CHLORIDE 0.9 % IV SOLN
1920.0000 mg/m2 | INTRAVENOUS | Status: DC
  Administered 2024-04-07: 3900 mg via INTRAVENOUS
  Filled 2024-04-07: qty 78

## 2024-04-07 MED ORDER — FAMOTIDINE IN NACL 20-0.9 MG/50ML-% IV SOLN
20.0000 mg | Freq: Once | INTRAVENOUS | Status: AC
  Administered 2024-04-07: 20 mg via INTRAVENOUS
  Filled 2024-04-07: qty 50

## 2024-04-07 NOTE — Patient Instructions (Signed)
 Fluorouracil Injection What is this medication? FLUOROURACIL (flure oh YOOR a sil) treats some types of cancer. It works by slowing down the growth of cancer cells. This medicine may be used for other purposes; ask your health care provider or pharmacist if you have questions. COMMON BRAND NAME(S): Adrucil What should I tell my care team before I take this medication? They need to know if you have any of these conditions: Blood disorders Dihydropyrimidine dehydrogenase (DPD) deficiency Infection, such as chickenpox, cold sores, herpes Kidney disease Liver disease Poor nutrition Recent or ongoing radiation therapy An unusual or allergic reaction to fluorouracil, other medications, foods, dyes, or preservatives If you or your partner are pregnant or trying to get pregnant Breast-feeding How should I use this medication? This medication is injected into a vein. It is administered by your care team in a hospital or clinic setting. Talk to your care team about the use of this medication in children. Special care may be needed. Overdosage: If you think you have taken too much of this medicine contact a poison control center or emergency room at once. NOTE: This medicine is only for you. Do not share this medicine with others. What if I miss a dose? Keep appointments for follow-up doses. It is important not to miss your dose. Call your care team if you are unable to keep an appointment. What may interact with this medication? Do not take this medication with any of the following: Live virus vaccines This medication may also interact with the following: Medications that treat or prevent blood clots, such as warfarin, enoxaparin, dalteparin This list may not describe all possible interactions. Give your health care provider a list of all the medicines, herbs, non-prescription drugs, or dietary supplements you use. Also tell them if you smoke, drink alcohol, or use illegal drugs. Some items may  interact with your medicine. What should I watch for while using this medication? Your condition will be monitored carefully while you are receiving this medication. This medication may make you feel generally unwell. This is not uncommon as chemotherapy can affect healthy cells as well as cancer cells. Report any side effects. Continue your course of treatment even though you feel ill unless your care team tells you to stop. In some cases, you may be given additional medications to help with side effects. Follow all directions for their use. This medication may increase your risk of getting an infection. Call your care team for advice if you get a fever, chills, sore throat, or other symptoms of a cold or flu. Do not treat yourself. Try to avoid being around people who are sick. This medication may increase your risk to bruise or bleed. Call your care team if you notice any unusual bleeding. Be careful brushing or flossing your teeth or using a toothpick because you may get an infection or bleed more easily. If you have any dental work done, tell your dentist you are receiving this medication. Avoid taking medications that contain aspirin, acetaminophen, ibuprofen, naproxen, or ketoprofen unless instructed by your care team. These medications may hide a fever. Do not treat diarrhea with over the counter products. Contact your care team if you have diarrhea that lasts more than 2 days or if it is severe and watery. This medication can make you more sensitive to the sun. Keep out of the sun. If you cannot avoid being in the sun, wear protective clothing and sunscreen. Do not use sun lamps, tanning beds, or tanning booths. Talk to  your care team if you or your partner wish to become pregnant or think you might be pregnant. This medication can cause serious birth defects if taken during pregnancy and for 3 months after the last dose. A reliable form of contraception is recommended while taking this  medication and for 3 months after the last dose. Talk to your care team about effective forms of contraception. Do not father a child while taking this medication and for 3 months after the last dose. Use a condom while having sex during this time period. Do not breastfeed while taking this medication. This medication may cause infertility. Talk to your care team if you are concerned about your fertility. What side effects may I notice from receiving this medication? Side effects that you should report to your care team as soon as possible: Allergic reactions--skin rash, itching, hives, swelling of the face, lips, tongue, or throat Heart attack--pain or tightness in the chest, shoulders, arms, or jaw, nausea, shortness of breath, cold or clammy skin, feeling faint or lightheaded Heart failure--shortness of breath, swelling of the ankles, feet, or hands, sudden weight gain, unusual weakness or fatigue Heart rhythm changes--fast or irregular heartbeat, dizziness, feeling faint or lightheaded, chest pain, trouble breathing High ammonia level--unusual weakness or fatigue, confusion, loss of appetite, nausea, vomiting, seizures Infection--fever, chills, cough, sore throat, wounds that don't heal, pain or trouble when passing urine, general feeling of discomfort or being unwell Low red blood cell level--unusual weakness or fatigue, dizziness, headache, trouble breathing Pain, tingling, or numbness in the hands or feet, muscle weakness, change in vision, confusion or trouble speaking, loss of balance or coordination, trouble walking, seizures Redness, swelling, and blistering of the skin over hands and feet Severe or prolonged diarrhea Unusual bruising or bleeding Side effects that usually do not require medical attention (report to your care team if they continue or are bothersome): Dry skin Headache Increased tears Nausea Pain, redness, or swelling with sores inside the mouth or throat Sensitivity  to light Vomiting This list may not describe all possible side effects. Call your doctor for medical advice about side effects. You may report side effects to FDA at 1-800-FDA-1088. Where should I keep my medication? This medication is given in a hospital or clinic. It will not be stored at home. NOTE: This sheet is a summary. It may not cover all possible information. If you have questions about this medicine, talk to your doctor, pharmacist, or health care provider.  2024 Elsevier/Gold Standard (2021-12-05 00:00:00)Leucovorin Injection What is this medication? LEUCOVORIN (loo koe VOR in) prevents side effects from certain medications, such as methotrexate. It works by increasing folate levels. This helps protect healthy cells in your body. It may also be used to treat anemia caused by low levels of folate. It can also be used with fluorouracil, a type of chemotherapy, to treat colorectal cancer. It works by increasing the effects of fluorouracil in the body. This medicine may be used for other purposes; ask your health care provider or pharmacist if you have questions. What should I tell my care team before I take this medication? They need to know if you have any of these conditions: Anemia from low levels of vitamin B12 in the blood An unusual or allergic reaction to leucovorin, folic acid, other medications, foods, dyes, or preservatives Pregnant or trying to get pregnant Breastfeeding How should I use this medication? This medication is injected into a vein or a muscle. It is given by your care team in  a hospital or clinic setting. Talk to your care team about the use of this medication in children. Special care may be needed. Overdosage: If you think you have taken too much of this medicine contact a poison control center or emergency room at once. NOTE: This medicine is only for you. Do not share this medicine with others. What if I miss a dose? Keep appointments for follow-up doses.  It is important not to miss your dose. Call your care team if you are unable to keep an appointment. What may interact with this medication? Capecitabine Fluorouracil Phenobarbital Phenytoin Primidone Trimethoprim;sulfamethoxazole This list may not describe all possible interactions. Give your health care provider a list of all the medicines, herbs, non-prescription drugs, or dietary supplements you use. Also tell them if you smoke, drink alcohol, or use illegal drugs. Some items may interact with your medicine. What should I watch for while using this medication? Your condition will be monitored carefully while you are receiving this medication. This medication may increase the side effects of 5-fluorouracil. Tell your care team if you have diarrhea or mouth sores that do not get better or that get worse. What side effects may I notice from receiving this medication? Side effects that you should report to your care team as soon as possible: Allergic reactions--skin rash, itching, hives, swelling of the face, lips, tongue, or throat This list may not describe all possible side effects. Call your doctor for medical advice about side effects. You may report side effects to FDA at 1-800-FDA-1088. Where should I keep my medication? This medication is given in a hospital or clinic. It will not be stored at home. NOTE: This sheet is a summary. It may not cover all possible information. If you have questions about this medicine, talk to your doctor, pharmacist, or health care provider.  2024 Elsevier/Gold Standard (2022-01-02 00:00:00)Oxaliplatin Injection What is this medication? OXALIPLATIN (ox AL i PLA tin) treats colorectal cancer. It works by slowing down the growth of cancer cells. This medicine may be used for other purposes; ask your health care provider or pharmacist if you have questions. COMMON BRAND NAME(S): Eloxatin What should I tell my care team before I take this medication? They  need to know if you have any of these conditions: Heart disease History of irregular heartbeat or rhythm Liver disease Low blood cell levels (white cells, red cells, and platelets) Lung or breathing disease, such as asthma Take medications that treat or prevent blood clots Tingling of the fingers, toes, or other nerve disorder An unusual or allergic reaction to oxaliplatin, other medications, foods, dyes, or preservatives If you or your partner are pregnant or trying to get pregnant Breast-feeding How should I use this medication? This medication is injected into a vein. It is given by your care team in a hospital or clinic setting. Talk to your care team about the use of this medication in children. Special care may be needed. Overdosage: If you think you have taken too much of this medicine contact a poison control center or emergency room at once. NOTE: This medicine is only for you. Do not share this medicine with others. What if I miss a dose? Keep appointments for follow-up doses. It is important not to miss a dose. Call your care team if you are unable to keep an appointment. What may interact with this medication? Do not take this medication with any of the following: Cisapride Dronedarone Pimozide Thioridazine This medication may also interact with the following:  Aspirin and aspirin-like medications Certain medications that treat or prevent blood clots, such as warfarin, apixaban, dabigatran, and rivaroxaban Cisplatin Cyclosporine Diuretics Medications for infection, such as acyclovir, adefovir, amphotericin B, bacitracin, cidofovir, foscarnet, ganciclovir, gentamicin, pentamidine, vancomycin NSAIDs, medications for pain and inflammation, such as ibuprofen or naproxen Other medications that cause heart rhythm changes Pamidronate Zoledronic acid This list may not describe all possible interactions. Give your health care provider a list of all the medicines, herbs,  non-prescription drugs, or dietary supplements you use. Also tell them if you smoke, drink alcohol, or use illegal drugs. Some items may interact with your medicine. What should I watch for while using this medication? Your condition will be monitored carefully while you are receiving this medication. You may need blood work while taking this medication. This medication may make you feel generally unwell. This is not uncommon as chemotherapy can affect healthy cells as well as cancer cells. Report any side effects. Continue your course of treatment even though you feel ill unless your care team tells you to stop. This medication may increase your risk of getting an infection. Call your care team for advice if you get a fever, chills, sore throat, or other symptoms of a cold or flu. Do not treat yourself. Try to avoid being around people who are sick. Avoid taking medications that contain aspirin, acetaminophen, ibuprofen, naproxen, or ketoprofen unless instructed by your care team. These medications may hide a fever. Be careful brushing or flossing your teeth or using a toothpick because you may get an infection or bleed more easily. If you have any dental work done, tell your dentist you are receiving this medication. This medication can make you more sensitive to cold. Do not drink cold drinks or use ice. Cover exposed skin before coming in contact with cold temperatures or cold objects. When out in cold weather wear warm clothing and cover your mouth and nose to warm the air that goes into your lungs. Tell your care team if you get sensitive to the cold. Talk to your care team if you or your partner are pregnant or think either of you might be pregnant. This medication can cause serious birth defects if taken during pregnancy and for 9 months after the last dose. A negative pregnancy test is required before starting this medication. A reliable form of contraception is recommended while taking this  medication and for 9 months after the last dose. Talk to your care team about effective forms of contraception. Do not father a child while taking this medication and for 6 months after the last dose. Use a condom while having sex during this time period. Do not breastfeed while taking this medication and for 3 months after the last dose. This medication may cause infertility. Talk to your care team if you are concerned about your fertility. What side effects may I notice from receiving this medication? Side effects that you should report to your care team as soon as possible: Allergic reactions--skin rash, itching, hives, swelling of the face, lips, tongue, or throat Bleeding--bloody or black, tar-like stools, vomiting blood or brown material that looks like coffee grounds, red or dark brown urine, small red or purple spots on skin, unusual bruising or bleeding Dry cough, shortness of breath or trouble breathing Heart rhythm changes--fast or irregular heartbeat, dizziness, feeling faint or lightheaded, chest pain, trouble breathing Infection--fever, chills, cough, sore throat, wounds that don't heal, pain or trouble when passing urine, general feeling of discomfort or being unwell  Liver injury--right upper belly pain, loss of appetite, nausea, light-colored stool, dark yellow or brown urine, yellowing skin or eyes, unusual weakness or fatigue Low red blood cell level--unusual weakness or fatigue, dizziness, headache, trouble breathing Muscle injury--unusual weakness or fatigue, muscle pain, dark yellow or brown urine, decrease in amount of urine Pain, tingling, or numbness in the hands or feet Sudden and severe headache, confusion, change in vision, seizures, which may be signs of posterior reversible encephalopathy syndrome (PRES) Unusual bruising or bleeding Side effects that usually do not require medical attention (report to your care team if they continue or are  bothersome): Diarrhea Nausea Pain, redness, or swelling with sores inside the mouth or throat Unusual weakness or fatigue Vomiting This list may not describe all possible side effects. Call your doctor for medical advice about side effects. You may report side effects to FDA at 1-800-FDA-1088. Where should I keep my medication? This medication is given in a hospital or clinic. It will not be stored at home. NOTE: This sheet is a summary. It may not cover all possible information. If you have questions about this medicine, talk to your doctor, pharmacist, or health care provider.  2024 Elsevier/Gold Standard (2023-07-12 00:00:00)

## 2024-04-07 NOTE — Progress Notes (Signed)
 Per Dr Ezzard ok to treat despite platelet count of 85 today

## 2024-04-09 ENCOUNTER — Inpatient Hospital Stay

## 2024-04-14 ENCOUNTER — Ambulatory Visit (INDEPENDENT_AMBULATORY_CARE_PROVIDER_SITE_OTHER): Admitting: Podiatry

## 2024-04-14 DIAGNOSIS — M79674 Pain in right toe(s): Secondary | ICD-10-CM

## 2024-04-14 DIAGNOSIS — M79675 Pain in left toe(s): Secondary | ICD-10-CM

## 2024-04-14 DIAGNOSIS — B351 Tinea unguium: Secondary | ICD-10-CM | POA: Diagnosis not present

## 2024-04-14 DIAGNOSIS — E1142 Type 2 diabetes mellitus with diabetic polyneuropathy: Secondary | ICD-10-CM | POA: Diagnosis not present

## 2024-04-14 NOTE — Progress Notes (Unsigned)
  Subjective:  Patient ID: Joshua Nelson, male    DOB: December 01, 1950,  MRN: [id number]  Chief Complaint  Patient presents with   [facility]    [facility] with out callous.  A1c was under 6.2 in June ASA    73 y.o. male presents with the above complaint. History confirmed with patient. Patient presenting with pain related to dystrophic thickened elongated nails. Patient is unable to trim own nails related to nail dystrophy and/or mobility issues. Patient does have a history of T2DM with peripheral neuropathy.  Is undergoing chemotherapy for hepatic cancer  Objective:  Physical Exam: warm, good capillary refill,  nail exam onychomycosis of the toenails, onycholysis, and dystrophic nails DP and PT pulses 1/4 bilateral, and protective sensation absent Left Foot:  Pain with palpation of nails due to elongation and dystrophic growth.  Right Foot: Pain with palpation of nails due to elongation and dystrophic growth.   Assessment:   1. Pain due to onychomycosis of toenails of both feet   2. DM type 2 with diabetic peripheral neuropathy (HCC)     Plan:  Patient was evaluated and treated and all questions answered.  #Onychomycosis with pain  -Nails palliatively debrided as below. -Educated on self-care  Procedure: Nail Debridement Rationale: Pain Type of Debridement: manual, sharp debridement. Instrumentation: Nail nipper, rotary burr. Number of Nails: 10  Patient educated on diabetes. Discussed proper diabetic foot care and discussed risks and complications of disease. Educated patient in depth on reasons to return to the office immediately should he/she discover anything concerning or new on the feet. All questions answered. Discussed proper shoes as well.    Return in about 3 months (around 07/14/2024) for Diabetic Foot Care.         Ethan Saddler, DPM [facility]

## 2024-04-15 ENCOUNTER — Encounter: Payer: Self-pay | Admitting: Podiatry

## 2024-04-15 ENCOUNTER — Other Ambulatory Visit: Payer: Self-pay

## 2024-04-17 ENCOUNTER — Other Ambulatory Visit

## 2024-04-18 DIAGNOSIS — C20 Malignant neoplasm of rectum: Secondary | ICD-10-CM | POA: Diagnosis not present

## 2024-04-19 NOTE — Progress Notes (Unsigned)
 Subjective:  Patient ID: Joshua Nelson, male    DOB: 19-Apr-1951  Age: 73 y.o. MRN: [id number]  No chief complaint on file.   HPI: Discussed the use of AI scribe software for clinical note transcription with the patient, who gave verbal consent to proceed.  History of Present Illness        04/07/2024    9:00 AM 03/26/2024    3:19 PM 03/24/2024   11:00 AM 03/10/2024   10:05 AM 02/27/2024    3:30 PM  Depression screen PHQ 2/9  Decreased Interest 0 0 0 0 0  Down, Depressed, Hopeless 0 0 0 0 0  PHQ - 2 Score 0 0 0 0 0        09/21/2023    4:55 PM  Fall Risk   Falls in the past year? 0  Number falls in past yr: 0  Injury with Fall? 0  Risk for fall due to : No Fall Risks  Follow up Falls evaluation completed    Patient Care Team: Sherre Clapper, MD as PCP - General (Family Medicine) Elisabeth Valli BIRCH, MD as Consulting Physician (Urology) Corlis Sharlet PARAS, RN as Case Manager (General Practice) Ezzard Valaria LABOR, MD as Consulting Physician (Oncology) Jomarie Agent, MD as Consulting Physician (Radiation Oncology)   Review of Systems  Constitutional:  Negative for chills, fatigue and fever.  HENT:  Negative for congestion, ear pain and sore throat.   Respiratory:  Negative for cough and shortness of breath.   Cardiovascular:  Negative for chest pain.  Gastrointestinal:  Negative for abdominal pain, constipation, diarrhea, nausea and vomiting.  Endocrine: Negative for polydipsia, polyphagia and polyuria.  Genitourinary:  Negative for dysuria and frequency.  Musculoskeletal:  Negative for arthralgias and myalgias.  Neurological:  Negative for dizziness and headaches.  Psychiatric/Behavioral:  Negative for dysphoric mood.        No dysphoria    Current Outpatient Medications on File Prior to Visit  Medication Sig Dispense Refill   aspirin EC 81 MG tablet Take 81 mg by mouth daily. Swallow whole.     Continuous Glucose Sensor (FREESTYLE LIBRE 2 SENSOR) MISC Change sensor every  10 days as directed, 2 each 5   ezetimibe  (ZETIA ) 10 MG tablet TAKE ONE (1) TABLET ONCE DAILY 90 tablet 1   FARXIGA  10 MG TABS tablet TAKE 1 TABLET BY MOUTH ONCE DAILY IN THE MORNING AS DIRECTED. 90 tablet 1   gabapentin  (NEURONTIN ) 300 MG capsule Take 1 capsule (300 mg total) by mouth 3 (three) times daily. 270 capsule 1   Glucagon  (GVOKE HYPOPEN  2-PACK) 0.5 MG/0.1ML SOAJ Inject 0.5 mg into the skin daily as needed. 0.2 mL 2   insulin  aspart (FIASP  FLEXTOUCH) 100 UNIT/ML FlexTouch Pen Inject 16 Units into the skin 2 (two) times daily. With breakfast and supper 30 mL 1   lisinopril  (ZESTRIL ) 10 MG tablet Take 1 tablet (10 mg total) by mouth daily. 90 tablet 1   meclizine  (ANTIVERT ) 25 MG tablet Take 1 tablet (25 mg total) by mouth 3 (three) times daily as needed for dizziness. 60 tablet 0   metoprolol  succinate (TOPROL -XL) 25 MG 24 hr tablet TAKE 1 TABLET BY MOUTH ONCE DAILY IN THE MORNING. 90 tablet 0   OLANZapine  (ZYPREXA ) 5 MG tablet One tab at bedtime prn nauese 21 tablet 1   ondansetron  (ZOFRAN ) 8 MG tablet Take 1 tablet (8 mg total) by mouth every 8 (eight) hours as needed for nausea or vomiting. 40 tablet 1  oxyCODONE  (OXY IR/ROXICODONE ) 5 MG immediate release tablet Take 5 mg by mouth every 4 (four) hours as needed. (Patient not taking: Reported on 04/14/2024)     prochlorperazine  (COMPAZINE ) 10 MG tablet Take 1 tablet (10 mg total) by mouth every 6 (six) hours as needed for nausea or vomiting. 90 tablet 3   Pseudoeph-Doxylamine-DM-APAP (NYQUIL PO) Take by mouth as needed.     rosuvastatin  (CRESTOR ) 40 MG tablet Take 1 tablet by mouth at bedtime. 90 tablet 0   Semaglutide , 2 MG/DOSE, (OZEMPIC , 2 MG/DOSE,) 8 MG/3ML SOPN Inject 2 mg into the skin once a week. 9 mL 0   traMADol  (ULTRAM ) 50 MG tablet 1-2 tabs po q6h prn pain (Patient not taking: Reported on 04/14/2024) 50 tablet 0   Current Facility-Administered Medications on File Prior to Visit  Medication Dose Route Frequency Provider Last  Rate Last Admin   sodium chloride  flush (NS) 0.9 % injection 10 mL  10 mL Intracatheter PRN Ezzard, Dequincy A, MD   10 mL at 02/06/24 0933   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   Myocardial infarct [facility])    Old myocardial infarction 03/28/2018   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   PONV (postoperative nausea and vomiting)    Prostate cancer (HCC) 07/2021   Past Surgical History:  Procedure Laterality Date   CARDIAC CATHETERIZATION     THREE CARDIAC STENTS PLACED   COLONOSCOPY W/ POLYPECTOMY     CYSTOSCOPY WITH URETHRAL DILATATION N/A 08/01/2021   Procedure: CYSTOSCOPY WITH BALLOON URETHRAL DILATATION;  Surgeon: Elisabeth Valli BIRCH, MD;  Location: [facility];  Service: Urology;  Laterality: N/A;   left cataract  10/2019   NOSE SURGERY     RESULT OF DOG BITE DURING CHILDHOOD   PROSTATE BIOPSY     TRANSURETHRAL RESECTION OF BLADDER TUMOR N/A 08/01/2021   Procedure: TRANSURETHRAL RESECTION OF PROSTATE;  Surgeon: Elisabeth Valli BIRCH, MD;  Location: [facility];  Service: Urology;  Laterality: N/A;  90 MINS   URETHRAL STRICTURE DILATATION      Family History  Problem Relation Age of Onset   Endometrial cancer Mother    CAD Mother    AAA (abdominal aortic aneurysm) Mother    Dementia Father    Anxiety disorder Father    Depression Father    Heart disease Father    Hypertension Father    Deep vein thrombosis Father    Kidney Stones Sister        H/O : INTESTINAL BLOCKAGE   Barrett's esophagus Sister    Hyperlipidemia Sister    Kidney Stones Brother    Heart attack Brother    Colon cancer Neg Hx    Esophageal cancer Neg Hx    Liver cancer Neg Hx    Stomach cancer Neg Hx    Social History   Socioeconomic History   Marital status: Married     Spouse name: Nathanel   Number of children: 1   Years of education: 12   Highest education level: 12th grade  Occupational History   Occupation: RETIRED - TRUCK DRIVER  Tobacco Use   Smoking status: Former    Current packs/day: 0.00    Types: Cigarettes    Quit date: 2003    Years since  quitting: 22.6   Smokeless tobacco: Never  Vaping Use   Vaping status: Never Used  Substance and Sexual Activity   Alcohol use: Not Currently    Comment: none since 2000   Drug use: Never   Sexual activity: Not Currently  Other Topics Concern   Not on file  Social History Narrative   Lives at home with his wife.   2 cups caffeine per day.   Right-handed.   Social Drivers of Corporate investment banker Strain: Low Risk  (01/14/2024)   Overall Financial Resource Strain (CARDIA)    Difficulty of Paying Living Expenses: Not hard at all  Food Insecurity: No Food Insecurity (04/17/2024)   Hunger Vital Sign    Worried About Running Out of Food in the Last Year: Never true    Ran Out of Food in the Last Year: Never true  Transportation Needs: No Transportation Needs (04/17/2024)   PRAPARE - Administrator, Civil Service (Medical): No    Lack of Transportation (Non-Medical): No  Physical Activity: Inactive (04/17/2024)   Exercise Vital Sign    Days of Exercise per Week: 0 days    Minutes of Exercise per Session: Not on file  Stress: No Stress Concern Present (04/17/2024)   [employer] of Occupational Health - Occupational Stress Questionnaire    Feeling of Stress: Only a little  Social Connections: Unknown (04/17/2024)   Social Connection and Isolation Panel    Frequency of Communication with Friends and Family: Not on file    Frequency of Social Gatherings with Friends and Family: Not on file    Attends Religious Services: Not on file    Active Member of Clubs or Organizations: Not on file    Attends Banker Meetings: Not on file    Marital Status: Married    Objective:   There were no vitals taken for this visit.     04/09/2024    1:38 PM 04/07/2024    9:37 AM 04/07/2024    9:15 AM  BP/Weight  Systolic BP 138 146 147  Diastolic BP 67 78 80  Wt. (Lbs)   170.5  BMI   25.18 kg/m2    Physical Exam Vitals reviewed.  Constitutional:      Appearance: Normal appearance.  Neck:     Vascular: No carotid bruit.  Cardiovascular:     Rate and Rhythm: Normal rate and regular rhythm.     Pulses: Normal pulses.     Heart sounds: Normal heart sounds.  Pulmonary:     Effort: Pulmonary effort is normal.     Breath sounds: Normal breath sounds. No wheezing, rhonchi or rales.  Abdominal:     General: Bowel sounds are normal.     Palpations: Abdomen is soft.     Tenderness: There is no abdominal tenderness.  Neurological:     Mental Status: He is alert.  Psychiatric:        Mood and Affect: Mood normal.        Behavior: Behavior normal.     {Perform Simple Foot Exam  Perform Detailed exam:1} {Insert foot Exam (Optional):30965}   Lab Results  Component Value Date   WBC 4.6 04/07/2024   HGB 9.8 (L) 04/07/2024   HCT 30.7 (L) 04/07/2024   PLT 85 (L) 04/07/2024   GLUCOSE 109 (H) 04/07/2024   CHOL 133 01/13/2024   TRIG 87 01/13/2024   HDL 42 01/13/2024   LDLCALC 74 01/13/2024   ALT 15 04/07/2024  AST 24 04/07/2024   NA 139 04/07/2024   K 4.2 04/07/2024   CL 104 04/07/2024   CREATININE 0.60 (L) 04/07/2024   BUN 14 04/07/2024   CO2 24 04/07/2024   TSH 1.180 09/16/2023   INR 1.1 11/05/2023   HGBA1C 6.2 (H) 01/13/2024      Assessment & Plan:  Diabetic polyneuropathy associated with type 2 diabetes mellitus (HCC)  Hypertensive heart disease without heart failure  Rectal cancer metastasized to liver [facility])  Coronary artery disease involving native coronary artery of native heart without angina pectoris  Obstructive sleep apnea  Mixed hyperlipidemia     Assessment and Plan Assessment & Plan      No orders of the defined types  were placed in this encounter.   No orders of the defined types were placed in this encounter.    Follow-up: No follow-ups on file.   I,Judith Campillo,acting as a Neurosurgeon for Abigail Free, MD.,have documented all relevant documentation on the behalf of Abigail Free, MD,as directed by  Abigail Free, MD while in the presence of Abigail Free, MD.   An After Visit Summary was printed and given to the patient.  Abigail Free, MD [facility] [phone]

## 2024-04-20 ENCOUNTER — Encounter: Payer: Self-pay | Admitting: Family Medicine

## 2024-04-20 ENCOUNTER — Ambulatory Visit (INDEPENDENT_AMBULATORY_CARE_PROVIDER_SITE_OTHER): Admitting: Family Medicine

## 2024-04-20 VITALS — BP 136/72 | HR 89 | Temp 98.1°F | Ht 69.0 in | Wt 170.0 lb

## 2024-04-20 DIAGNOSIS — I2510 Atherosclerotic heart disease of native coronary artery without angina pectoris: Secondary | ICD-10-CM | POA: Diagnosis not present

## 2024-04-20 DIAGNOSIS — C787 Secondary malignant neoplasm of liver and intrahepatic bile duct: Secondary | ICD-10-CM | POA: Diagnosis not present

## 2024-04-20 DIAGNOSIS — Z23 Encounter for immunization: Secondary | ICD-10-CM

## 2024-04-20 DIAGNOSIS — I119 Hypertensive heart disease without heart failure: Secondary | ICD-10-CM | POA: Diagnosis not present

## 2024-04-20 DIAGNOSIS — C20 Malignant neoplasm of rectum: Secondary | ICD-10-CM

## 2024-04-20 DIAGNOSIS — E782 Mixed hyperlipidemia: Secondary | ICD-10-CM

## 2024-04-20 DIAGNOSIS — E1142 Type 2 diabetes mellitus with diabetic polyneuropathy: Secondary | ICD-10-CM

## 2024-04-20 DIAGNOSIS — G4733 Obstructive sleep apnea (adult) (pediatric): Secondary | ICD-10-CM

## 2024-04-20 LAB — POCT LIPID PANEL
HDL: 47
LDL: 116
Non-HDL: 153
TC: 200
TRG: 188

## 2024-04-20 LAB — POCT GLYCOSYLATED HEMOGLOBIN (HGB A1C): HbA1c POC (<> result, manual entry): 5 % (ref 4.0–5.6)

## 2024-04-20 MED ORDER — FREESTYLE LIBRE 3 PLUS SENSOR MISC: 3 refills | Status: AC

## 2024-04-20 MED ORDER — FREESTYLE LIBRE READER DEVI: 0 refills | Status: DC

## 2024-04-20 MED ORDER — SEMAGLUTIDE (1 MG/DOSE) 4 MG/3ML ~~LOC~~ SOPN: 1.0000 mg | PEN_INJECTOR | SUBCUTANEOUS | Status: DC

## 2024-04-20 NOTE — Patient Instructions (Signed)
  VISIT SUMMARY: Today, we addressed your chemotherapy-induced nausea and fatigue, appetite suppression and weight loss, diabetes management, peripheral neuropathy, heart disease, and general health maintenance. We made some adjustments to your medications and discussed necessary vaccinations.  YOUR PLAN: CHEMOTHERAPY-INDUCED NAUSEA AND FATIGUE: You are experiencing significant nausea and fatigue due to chemotherapy. -Continue taking your prescribed ANTI NAUSEA medication at night and before breakfast. -If nausea persists, your oncologist may consider reducing the chemotherapy dose.  APPETITE SUPPRESSION AND WEIGHT LOSS: Your decreased appetite is likely due to both chemotherapy and your diabetes medications.  -Make an effort to maintain your oral intake.  TYPE 2 DIABETES MELLITUS WITH DIABETIC POLYNEUROPATHY: Your diabetes is well-controlled, but your medications are affecting your appetite. You also have neuropathy in your hands and feet. -Decrease Ozempic  to 1 mg weekly. -Continue taking Farxiga  10 mg daily. -Continue taking gabapentin  for neuropathy. -Sent new freestyle 3 plus sensors with reader  ATHEROSCLEROTIC HEART DISEASE OF NATIVE CORONARY ARTERY WITHOUT ANGINA: Your heart disease is being managed with medications and you are currently asymptomatic. -Continue taking lisinopril  10 mg daily. -Continue taking toprol  XL 25 mg daily. -Continue taking rosuvastatin  40 mg daily.  HYPERTENSIVE HEART DISEASE WITHOUT HEART FAILURE: Your blood pressure is well-controlled with your current medications. -Continue taking lisinopril  10 mg daily. -Continue taking toprol  XL 25 mg daily.  MIXED HYPERLIPIDEMIA: Your cholesterol levels are being managed with medications, but your LDL and triglycerides are slightly elevated. -Continue taking rosuvastatin  40 mg daily. -Continue taking Zetia .  GENERAL HEALTH MAINTENANCE: You are due for several vaccinations and an annual wellness visit. -You  received your flu shot today. -Please obtain shingles and tetanus vaccines at the pharmacy. -Your Medicare annual wellness visit is scheduled for September 18th.                      Contains text generated by Abridge.                                 Contains text generated by Abridge.

## 2024-04-21 ENCOUNTER — Inpatient Hospital Stay: Attending: Oncology

## 2024-04-21 ENCOUNTER — Encounter: Payer: Self-pay | Admitting: Oncology

## 2024-04-21 ENCOUNTER — Other Ambulatory Visit: Payer: Self-pay

## 2024-04-21 ENCOUNTER — Inpatient Hospital Stay

## 2024-04-21 ENCOUNTER — Other Ambulatory Visit: Payer: Self-pay | Admitting: Hematology and Oncology

## 2024-04-21 ENCOUNTER — Inpatient Hospital Stay (HOSPITAL_BASED_OUTPATIENT_CLINIC_OR_DEPARTMENT_OTHER): Attending: Oncology | Admitting: Hematology and Oncology

## 2024-04-21 VITALS — BP 140/84 | HR 80 | Temp 98.0°F | Resp 18 | Ht 69.0 in | Wt 167.5 lb

## 2024-04-21 DIAGNOSIS — C787 Secondary malignant neoplasm of liver and intrahepatic bile duct: Secondary | ICD-10-CM

## 2024-04-21 DIAGNOSIS — Z79899 Other long term (current) drug therapy: Secondary | ICD-10-CM | POA: Diagnosis not present

## 2024-04-21 DIAGNOSIS — Z5111 Encounter for antineoplastic chemotherapy: Secondary | ICD-10-CM | POA: Insufficient documentation

## 2024-04-21 DIAGNOSIS — G629 Polyneuropathy, unspecified: Secondary | ICD-10-CM | POA: Insufficient documentation

## 2024-04-21 DIAGNOSIS — C20 Malignant neoplasm of rectum: Secondary | ICD-10-CM

## 2024-04-21 LAB — CBC WITH DIFFERENTIAL (CANCER CENTER ONLY)
Abs Immature Granulocytes: 0.01 K/uL (ref 0.00–0.07)
Basophils Absolute: 0 K/uL (ref 0.0–0.1)
Basophils Relative: 0 %
Eosinophils Absolute: 0.1 K/uL (ref 0.0–0.5)
Eosinophils Relative: 2 %
HCT: 32 % — ABNORMAL LOW (ref 39.0–52.0)
Hemoglobin: 10.2 g/dL — ABNORMAL LOW (ref 13.0–17.0)
Immature Granulocytes: 0 %
Lymphocytes Relative: 16 %
Lymphs Abs: 0.7 K/uL (ref 0.7–4.0)
MCH: 29.9 pg (ref 26.0–34.0)
MCHC: 31.9 g/dL (ref 30.0–36.0)
MCV: 93.8 fL (ref 80.0–100.0)
Monocytes Absolute: 0.4 K/uL (ref 0.1–1.0)
Monocytes Relative: 8 %
Neutro Abs: 3.3 K/uL (ref 1.7–7.7)
Neutrophils Relative %: 74 %
Platelet Count: 89 K/uL — ABNORMAL LOW (ref 150–400)
RBC: 3.41 MIL/uL — ABNORMAL LOW (ref 4.22–5.81)
RDW: 18.6 % — ABNORMAL HIGH (ref 11.5–15.5)
WBC Count: 4.5 K/uL (ref 4.0–10.5)
nRBC: 0 % (ref 0.0–0.2)

## 2024-04-21 LAB — CMP (CANCER CENTER ONLY)
ALT: 13 U/L (ref 0–44)
AST: 22 U/L (ref 15–41)
Albumin: 4.2 g/dL (ref 3.5–5.0)
Alkaline Phosphatase: 56 U/L (ref 38–126)
Anion gap: 10 (ref 5–15)
BUN: 13 mg/dL (ref 8–23)
CO2: 24 mmol/L (ref 22–32)
Calcium: 9.3 mg/dL (ref 8.9–10.3)
Chloride: 104 mmol/L (ref 98–111)
Creatinine: 0.79 mg/dL (ref 0.61–1.24)
GFR, Estimated: >60 mL/min (ref >60)
Glucose, Bld: 170 mg/dL — ABNORMAL HIGH (ref 70–99)
Potassium: 4.2 mmol/L (ref 3.5–5.1)
Sodium: 139 mmol/L (ref 135–145)
Total Bilirubin: 0.7 mg/dL (ref 0.0–1.2)
Total Protein: 6.8 g/dL (ref 6.5–8.1)

## 2024-04-21 LAB — TOTAL PROTEIN, URINE DIPSTICK: Protein, ur: NEGATIVE mg/dL

## 2024-04-21 MED ORDER — DIPHENHYDRAMINE HCL 50 MG/ML IJ SOLN
25.0000 mg | Freq: Once | INTRAMUSCULAR | Status: AC
  Administered 2024-04-21: 25 mg via INTRAVENOUS
  Filled 2024-04-21: qty 1

## 2024-04-21 MED ORDER — SODIUM CHLORIDE 0.9 % IV SOLN
5.0000 mg/kg | Freq: Once | INTRAVENOUS | Status: AC
  Administered 2024-04-21: 400 mg via INTRAVENOUS
  Filled 2024-04-21: qty 16

## 2024-04-21 MED ORDER — DEXAMETHASONE SODIUM PHOSPHATE 10 MG/ML IJ SOLN
10.0000 mg | Freq: Once | INTRAMUSCULAR | Status: AC
  Administered 2024-04-21: 10 mg via INTRAVENOUS
  Filled 2024-04-21: qty 1

## 2024-04-21 MED ORDER — SODIUM CHLORIDE 0.9 % IV SOLN
1920.0000 mg/m2 | INTRAVENOUS | Status: DC
  Administered 2024-04-21: 3900 mg via INTRAVENOUS
  Filled 2024-04-21: qty 78

## 2024-04-21 MED ORDER — SODIUM CHLORIDE 0.9 % IV SOLN: INTRAVENOUS | Status: DC

## 2024-04-21 MED ORDER — FAMOTIDINE IN NACL 20-0.9 MG/50ML-% IV SOLN
20.0000 mg | Freq: Once | INTRAVENOUS | Status: AC
  Administered 2024-04-21: 20 mg via INTRAVENOUS
  Filled 2024-04-21: qty 50

## 2024-04-21 MED ORDER — OXALIPLATIN CHEMO INJECTION 50 MG/10ML
66.0000 mg/m2 | Freq: Once | INTRAVENOUS | Status: AC
  Administered 2024-04-21: 135 mg via INTRAVENOUS
  Filled 2024-04-21: qty 20

## 2024-04-21 MED ORDER — PALONOSETRON HCL INJECTION 0.25 MG/5ML
0.2500 mg | Freq: Once | INTRAVENOUS | Status: AC
  Administered 2024-04-21: 0.25 mg via INTRAVENOUS
  Filled 2024-04-21: qty 5

## 2024-04-21 MED ORDER — LEUCOVORIN CALCIUM INJECTION 350 MG
320.0000 mg/m2 | Freq: Once | INTRAVENOUS | Status: AC
  Administered 2024-04-21: 652 mg via INTRAVENOUS
  Filled 2024-04-21: qty 32.6

## 2024-04-21 MED ORDER — DEXTROSE 5 % IV SOLN: INTRAVENOUS | Status: DC

## 2024-04-21 MED ORDER — FLUOROURACIL CHEMO INJECTION 2.5 GM/50ML
320.0000 mg/m2 | Freq: Once | INTRAVENOUS | Status: AC
  Administered 2024-04-21: 650 mg via INTRAVENOUS
  Filled 2024-04-21: qty 13

## 2024-04-21 NOTE — Progress Notes (Signed)
 [facility]  [address] [place],  [place]  [address] [phone]  Clinic Day:  04/21/2024  Referring physician: Sherre Clapper, MD   HISTORY OF PRESENT ILLNESS:  The patient is a 73 y.o. male with  metastatic rectal cancer, including spread of disease to his liver.  He comes in today to be evaluated before heading into his 10th cycle of FOLFOX/Avastin .  He states he tolerated his 9th cycle of chemotherapy fairly well. 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  He did have a few more bouts of nausea with his last cycle of treatment.  However, he has not been taking his prescribed nausea medicine as frequently as it has been prescribed for his acute episodes of nausea.   With respect to his metastatic rectal cancer, he denies having any new symptoms/findings which concern him for disease progression while on his current chemotherapy.    VITALS:  Blood pressure (!) 140/84, pulse 80, temperature 98 F (36.7 C), temperature source Oral, resp. rate 18, height 5' 9 (1.753 m), weight 167 lb 8 oz (76 kg), SpO2 100%. Wt Readings from Last 3 Encounters:  04/21/24 167 lb 8 oz (76 kg)  04/20/24 170 lb (77.1 kg)  04/07/24 170 lb 8 oz (77.3 kg)   Body mass index is 24.74 kg/m.  Performance status (ECOG): 1 - Symptomatic but completely ambulatory  PHYSICAL EXAM:   Physical Exam Vitals and nursing note reviewed.  Constitutional:      General: He is not in acute distress.    Appearance: Normal appearance. He is normal weight.  HENT:     Head: Normocephalic and atraumatic.     Mouth/Throat:     Mouth: Mucous membranes are moist.     Pharynx: Oropharynx is clear. No oropharyngeal exudate or posterior oropharyngeal erythema.  Eyes:     General: No scleral icterus.    Extraocular Movements: Extraocular movements intact.     Conjunctiva/sclera: Conjunctivae normal.     Pupils: Pupils are equal, round, and reactive to light.  Cardiovascular:     Rate and Rhythm:  Normal rate and regular rhythm.     Heart sounds: Normal heart sounds. No murmur heard.    No friction rub. No gallop.  Pulmonary:     Effort: Pulmonary effort is normal.     Breath sounds: Normal breath sounds. No wheezing, rhonchi or rales.  Abdominal:     General: Bowel sounds are normal. There is no distension.     Palpations: Abdomen is soft. There is no hepatomegaly, splenomegaly or mass.     Tenderness: There is no abdominal tenderness.  Musculoskeletal:        General: Normal range of motion.     Cervical back: Normal range of motion and neck supple. No tenderness.     Right lower leg: No edema.     Left lower leg: No edema.  Lymphadenopathy:     Cervical: No cervical adenopathy.     Upper Body:     Right upper body: No supraclavicular or axillary adenopathy.     Left upper body: No supraclavicular or axillary adenopathy.     Lower Body: No right inguinal adenopathy. No left inguinal adenopathy.  Skin:    General: Skin is warm and dry.     Coloration: Skin is not jaundiced.     Findings: No rash.  Neurological:     Mental Status: He is alert and oriented to person, place, and time.  Cranial Nerves: No cranial nerve deficit.  Psychiatric:        Mood and Affect: Mood normal.        Behavior: Behavior normal.        Thought Content: Thought content normal.    LABS:      Latest Ref Rng & Units 04/21/2024    8:24 AM 04/07/2024    8:55 AM 03/24/2024   10:38 AM  CBC  WBC 4.0 - 10.5 K/uL 4.5  4.6  4.6   Hemoglobin 13.0 - 17.0 g/dL 89.7  9.8  89.9   Hematocrit 39.0 - 52.0 % 32.0  30.7  31.4   Platelets 150 - 400 K/uL 89  85  110       Latest Ref Rng & Units 04/21/2024    8:24 AM 04/07/2024    8:55 AM 03/24/2024   10:38 AM  CMP  Glucose 70 - 99 mg/dL 829  890  874   BUN 8 - 23 mg/dL 13  14  12    Creatinine 0.61 - 1.24 mg/dL 9.20  9.39  9.36   Sodium 135 - 145 mmol/L 139  139  140   Potassium 3.5 - 5.1 mmol/L 4.2  4.2  4.1   Chloride 98 - 111 mmol/L 104  104  106    CO2 22 - 32 mmol/L 24  24  24    Calcium  8.9 - 10.3 mg/dL 9.3  9.5  9.6   Total Protein 6.5 - 8.1 g/dL 6.8  6.9  6.8   Total Bilirubin 0.0 - 1.2 mg/dL 0.7  0.8  0.7   Alkaline Phos 38 - 126 U/L 56  52  57   AST 15 - 41 U/L 22  24  29    ALT 0 - 44 U/L 13  15  17       Lab Results  Component Value Date   CEA 7.04 (H) 02/06/2024   CEA 13.98 (H) 10/28/2023    ASSESSMENT & PLAN:  Assessment/Plan:  73 y.o. male with metastatic rectal cancer including spread of his disease to his liver. He will proceed with his 10th cycle of FOLFOX/Avastin  today.  As his hemoglobin is less than 10 today, he will receive Retacrit  20,000 units.  I stressed to him the importance of using his nausea medicine more regularly during those times when he knows that his nausea will be more prominent, which is usually the first 2-3 days after each cycle of chemotherapy.  Overall, he appears to be doing fairly well.  I will see him back in 2 weeks before he heads into his 11th cycle of palliative FOLFOX/Avastin  chemotherapy.  The patient understands all the plans discussed today and is in agreement with them.  Eleanor DELENA Bach, NP

## 2024-04-21 NOTE — Patient Instructions (Signed)
 Fluorouracil Injection What is this medication? FLUOROURACIL (flure oh YOOR a sil) treats some types of cancer. It works by slowing down the growth of cancer cells. This medicine may be used for other purposes; ask your health care provider or pharmacist if you have questions. COMMON BRAND NAME(S): Adrucil What should I tell my care team before I take this medication? They need to know if you have any of these conditions: Blood disorders Dihydropyrimidine dehydrogenase (DPD) deficiency Infection, such as chickenpox, cold sores, herpes Kidney disease Liver disease Poor nutrition Recent or ongoing radiation therapy An unusual or allergic reaction to fluorouracil, other medications, foods, dyes, or preservatives If you or your partner are pregnant or trying to get pregnant Breast-feeding How should I use this medication? This medication is injected into a vein. It is administered by your care team in a hospital or clinic setting. Talk to your care team about the use of this medication in children. Special care may be needed. Overdosage: If you think you have taken too much of this medicine contact a poison control center or emergency room at once. NOTE: This medicine is only for you. Do not share this medicine with others. What if I miss a dose? Keep appointments for follow-up doses. It is important not to miss your dose. Call your care team if you are unable to keep an appointment. What may interact with this medication? Do not take this medication with any of the following: Live virus vaccines This medication may also interact with the following: Medications that treat or prevent blood clots, such as warfarin, enoxaparin, dalteparin This list may not describe all possible interactions. Give your health care provider a list of all the medicines, herbs, non-prescription drugs, or dietary supplements you use. Also tell them if you smoke, drink alcohol, or use illegal drugs. Some items may  interact with your medicine. What should I watch for while using this medication? Your condition will be monitored carefully while you are receiving this medication. This medication may make you feel generally unwell. This is not uncommon as chemotherapy can affect healthy cells as well as cancer cells. Report any side effects. Continue your course of treatment even though you feel ill unless your care team tells you to stop. In some cases, you may be given additional medications to help with side effects. Follow all directions for their use. This medication may increase your risk of getting an infection. Call your care team for advice if you get a fever, chills, sore throat, or other symptoms of a cold or flu. Do not treat yourself. Try to avoid being around people who are sick. This medication may increase your risk to bruise or bleed. Call your care team if you notice any unusual bleeding. Be careful brushing or flossing your teeth or using a toothpick because you may get an infection or bleed more easily. If you have any dental work done, tell your dentist you are receiving this medication. Avoid taking medications that contain aspirin, acetaminophen, ibuprofen, naproxen, or ketoprofen unless instructed by your care team. These medications may hide a fever. Do not treat diarrhea with over the counter products. Contact your care team if you have diarrhea that lasts more than 2 days or if it is severe and watery. This medication can make you more sensitive to the sun. Keep out of the sun. If you cannot avoid being in the sun, wear protective clothing and sunscreen. Do not use sun lamps, tanning beds, or tanning booths. Talk to  your care team if you or your partner wish to become pregnant or think you might be pregnant. This medication can cause serious birth defects if taken during pregnancy and for 3 months after the last dose. A reliable form of contraception is recommended while taking this  medication and for 3 months after the last dose. Talk to your care team about effective forms of contraception. Do not father a child while taking this medication and for 3 months after the last dose. Use a condom while having sex during this time period. Do not breastfeed while taking this medication. This medication may cause infertility. Talk to your care team if you are concerned about your fertility. What side effects may I notice from receiving this medication? Side effects that you should report to your care team as soon as possible: Allergic reactions--skin rash, itching, hives, swelling of the face, lips, tongue, or throat Heart attack--pain or tightness in the chest, shoulders, arms, or jaw, nausea, shortness of breath, cold or clammy skin, feeling faint or lightheaded Heart failure--shortness of breath, swelling of the ankles, feet, or hands, sudden weight gain, unusual weakness or fatigue Heart rhythm changes--fast or irregular heartbeat, dizziness, feeling faint or lightheaded, chest pain, trouble breathing High ammonia level--unusual weakness or fatigue, confusion, loss of appetite, nausea, vomiting, seizures Infection--fever, chills, cough, sore throat, wounds that don't heal, pain or trouble when passing urine, general feeling of discomfort or being unwell Low red blood cell level--unusual weakness or fatigue, dizziness, headache, trouble breathing Pain, tingling, or numbness in the hands or feet, muscle weakness, change in vision, confusion or trouble speaking, loss of balance or coordination, trouble walking, seizures Redness, swelling, and blistering of the skin over hands and feet Severe or prolonged diarrhea Unusual bruising or bleeding Side effects that usually do not require medical attention (report to your care team if they continue or are bothersome): Dry skin Headache Increased tears Nausea Pain, redness, or swelling with sores inside the mouth or throat Sensitivity  to light Vomiting This list may not describe all possible side effects. Call your doctor for medical advice about side effects. You may report side effects to FDA at 1-800-FDA-1088. Where should I keep my medication? This medication is given in a hospital or clinic. It will not be stored at home. NOTE: This sheet is a summary. It may not cover all possible information. If you have questions about this medicine, talk to your doctor, pharmacist, or health care provider.  2024 Elsevier/Gold Standard (2021-12-05 00:00:00)Leucovorin Injection What is this medication? LEUCOVORIN (loo koe VOR in) prevents side effects from certain medications, such as methotrexate. It works by increasing folate levels. This helps protect healthy cells in your body. It may also be used to treat anemia caused by low levels of folate. It can also be used with fluorouracil, a type of chemotherapy, to treat colorectal cancer. It works by increasing the effects of fluorouracil in the body. This medicine may be used for other purposes; ask your health care provider or pharmacist if you have questions. What should I tell my care team before I take this medication? They need to know if you have any of these conditions: Anemia from low levels of vitamin B12 in the blood An unusual or allergic reaction to leucovorin, folic acid, other medications, foods, dyes, or preservatives Pregnant or trying to get pregnant Breastfeeding How should I use this medication? This medication is injected into a vein or a muscle. It is given by your care team in  a hospital or clinic setting. Talk to your care team about the use of this medication in children. Special care may be needed. Overdosage: If you think you have taken too much of this medicine contact a poison control center or emergency room at once. NOTE: This medicine is only for you. Do not share this medicine with others. What if I miss a dose? Keep appointments for follow-up doses.  It is important not to miss your dose. Call your care team if you are unable to keep an appointment. What may interact with this medication? Capecitabine Fluorouracil Phenobarbital Phenytoin Primidone Trimethoprim;sulfamethoxazole This list may not describe all possible interactions. Give your health care provider a list of all the medicines, herbs, non-prescription drugs, or dietary supplements you use. Also tell them if you smoke, drink alcohol, or use illegal drugs. Some items may interact with your medicine. What should I watch for while using this medication? Your condition will be monitored carefully while you are receiving this medication. This medication may increase the side effects of 5-fluorouracil. Tell your care team if you have diarrhea or mouth sores that do not get better or that get worse. What side effects may I notice from receiving this medication? Side effects that you should report to your care team as soon as possible: Allergic reactions--skin rash, itching, hives, swelling of the face, lips, tongue, or throat This list may not describe all possible side effects. Call your doctor for medical advice about side effects. You may report side effects to FDA at 1-800-FDA-1088. Where should I keep my medication? This medication is given in a hospital or clinic. It will not be stored at home. NOTE: This sheet is a summary. It may not cover all possible information. If you have questions about this medicine, talk to your doctor, pharmacist, or health care provider.  2024 Elsevier/Gold Standard (2022-01-02 00:00:00)Oxaliplatin Injection What is this medication? OXALIPLATIN (ox AL i PLA tin) treats colorectal cancer. It works by slowing down the growth of cancer cells. This medicine may be used for other purposes; ask your health care provider or pharmacist if you have questions. COMMON BRAND NAME(S): Eloxatin What should I tell my care team before I take this medication? They  need to know if you have any of these conditions: Heart disease History of irregular heartbeat or rhythm Liver disease Low blood cell levels (white cells, red cells, and platelets) Lung or breathing disease, such as asthma Take medications that treat or prevent blood clots Tingling of the fingers, toes, or other nerve disorder An unusual or allergic reaction to oxaliplatin, other medications, foods, dyes, or preservatives If you or your partner are pregnant or trying to get pregnant Breast-feeding How should I use this medication? This medication is injected into a vein. It is given by your care team in a hospital or clinic setting. Talk to your care team about the use of this medication in children. Special care may be needed. Overdosage: If you think you have taken too much of this medicine contact a poison control center or emergency room at once. NOTE: This medicine is only for you. Do not share this medicine with others. What if I miss a dose? Keep appointments for follow-up doses. It is important not to miss a dose. Call your care team if you are unable to keep an appointment. What may interact with this medication? Do not take this medication with any of the following: Cisapride Dronedarone Pimozide Thioridazine This medication may also interact with the following:  Aspirin and aspirin-like medications Certain medications that treat or prevent blood clots, such as warfarin, apixaban, dabigatran, and rivaroxaban Cisplatin Cyclosporine Diuretics Medications for infection, such as acyclovir, adefovir, amphotericin B, bacitracin, cidofovir, foscarnet, ganciclovir, gentamicin, pentamidine, vancomycin NSAIDs, medications for pain and inflammation, such as ibuprofen or naproxen Other medications that cause heart rhythm changes Pamidronate Zoledronic acid This list may not describe all possible interactions. Give your health care provider a list of all the medicines, herbs,  non-prescription drugs, or dietary supplements you use. Also tell them if you smoke, drink alcohol, or use illegal drugs. Some items may interact with your medicine. What should I watch for while using this medication? Your condition will be monitored carefully while you are receiving this medication. You may need blood work while taking this medication. This medication may make you feel generally unwell. This is not uncommon as chemotherapy can affect healthy cells as well as cancer cells. Report any side effects. Continue your course of treatment even though you feel ill unless your care team tells you to stop. This medication may increase your risk of getting an infection. Call your care team for advice if you get a fever, chills, sore throat, or other symptoms of a cold or flu. Do not treat yourself. Try to avoid being around people who are sick. Avoid taking medications that contain aspirin, acetaminophen, ibuprofen, naproxen, or ketoprofen unless instructed by your care team. These medications may hide a fever. Be careful brushing or flossing your teeth or using a toothpick because you may get an infection or bleed more easily. If you have any dental work done, tell your dentist you are receiving this medication. This medication can make you more sensitive to cold. Do not drink cold drinks or use ice. Cover exposed skin before coming in contact with cold temperatures or cold objects. When out in cold weather wear warm clothing and cover your mouth and nose to warm the air that goes into your lungs. Tell your care team if you get sensitive to the cold. Talk to your care team if you or your partner are pregnant or think either of you might be pregnant. This medication can cause serious birth defects if taken during pregnancy and for 9 months after the last dose. A negative pregnancy test is required before starting this medication. A reliable form of contraception is recommended while taking this  medication and for 9 months after the last dose. Talk to your care team about effective forms of contraception. Do not father a child while taking this medication and for 6 months after the last dose. Use a condom while having sex during this time period. Do not breastfeed while taking this medication and for 3 months after the last dose. This medication may cause infertility. Talk to your care team if you are concerned about your fertility. What side effects may I notice from receiving this medication? Side effects that you should report to your care team as soon as possible: Allergic reactions--skin rash, itching, hives, swelling of the face, lips, tongue, or throat Bleeding--bloody or black, tar-like stools, vomiting blood or brown material that looks like coffee grounds, red or dark brown urine, small red or purple spots on skin, unusual bruising or bleeding Dry cough, shortness of breath or trouble breathing Heart rhythm changes--fast or irregular heartbeat, dizziness, feeling faint or lightheaded, chest pain, trouble breathing Infection--fever, chills, cough, sore throat, wounds that don't heal, pain or trouble when passing urine, general feeling of discomfort or being unwell  Liver injury--right upper belly pain, loss of appetite, nausea, light-colored stool, dark yellow or brown urine, yellowing skin or eyes, unusual weakness or fatigue Low red blood cell level--unusual weakness or fatigue, dizziness, headache, trouble breathing Muscle injury--unusual weakness or fatigue, muscle pain, dark yellow or brown urine, decrease in amount of urine Pain, tingling, or numbness in the hands or feet Sudden and severe headache, confusion, change in vision, seizures, which may be signs of posterior reversible encephalopathy syndrome (PRES) Unusual bruising or bleeding Side effects that usually do not require medical attention (report to your care team if they continue or are  bothersome): Diarrhea Nausea Pain, redness, or swelling with sores inside the mouth or throat Unusual weakness or fatigue Vomiting This list may not describe all possible side effects. Call your doctor for medical advice about side effects. You may report side effects to FDA at 1-800-FDA-1088. Where should I keep my medication? This medication is given in a hospital or clinic. It will not be stored at home. NOTE: This sheet is a summary. It may not cover all possible information. If you have questions about this medicine, talk to your doctor, pharmacist, or health care provider.  2024 Elsevier/Gold Standard (2023-07-12 00:00:00)

## 2024-04-21 NOTE — Progress Notes (Signed)
 Okay to treat with platelets of 89 per Scarlette mouse, NP.

## 2024-04-23 ENCOUNTER — Other Ambulatory Visit: Payer: Self-pay

## 2024-04-23 ENCOUNTER — Telehealth: Payer: Self-pay

## 2024-04-23 ENCOUNTER — Inpatient Hospital Stay

## 2024-04-23 NOTE — Progress Notes (Unsigned)
   04/23/2024  Patient ID: Joshua Nelson, male   DOB: August 07, 1951, 73 y.o.   MRN: [id number]  Pharmacy Quality Measure Review  This patient is appearing on a report for being at risk of failing the adherence measure for cholesterol (statin) medications this calendar year.   Medication: rosuvastatin  40mg  sold 12/10/2023 90DS  Medication: farxiga  10mg  sold 03/26/2024 30DS  Reviewed medication indication, dosing, and goals of therapy.  and will pend rosuvastatin  order to PCP to refill as appropriate last seen 04/21/2024.  Lang Sieve, PharmD, BCGP Clinical Pharmacist  [phone]

## 2024-04-23 NOTE — Telephone Encounter (Signed)
 Patient aware ozempic  4 boxes and pen needles 2 boxes are here for pick up.

## 2024-04-23 NOTE — Patient Instructions (Signed)
 Fluorouracil  Injection What is this medication? FLUOROURACIL  (flure oh YOOR a sil) treats some types of cancer. It works by slowing down the growth of cancer cells. This medicine may be used for other purposes; ask your health care provider or pharmacist if you have questions. COMMON BRAND NAME(S): Adrucil  What should I tell my care team before I take this medication? They need to know if you have any of these conditions: Blood disorders Dihydropyrimidine dehydrogenase (DPD) deficiency Infection, such as chickenpox, cold sores, herpes Kidney disease Liver disease Poor nutrition Recent or ongoing radiation therapy An unusual or allergic reaction to fluorouracil , other medications, foods, dyes, or preservatives If you or your partner are pregnant or trying to get pregnant Breast-feeding How should I use this medication? This medication is injected into a vein. It is administered by your care team in a hospital or clinic setting. Talk to your care team about the use of this medication in children. Special care may be needed. Overdosage: If you think you have taken too much of this medicine contact a poison control center or emergency room at once. NOTE: This medicine is only for you. Do not share this medicine with others. What if I miss a dose? Keep appointments for follow-up doses. It is important not to miss your dose. Call your care team if you are unable to keep an appointment. What may interact with this medication? Do not take this medication with any of the following: Live virus vaccines This medication may also interact with the following: Medications that treat or prevent blood clots, such as warfarin, enoxaparin, dalteparin This list may not describe all possible interactions. Give your health care provider a list of all the medicines, herbs, non-prescription drugs, or dietary supplements you use. Also tell them if you smoke, drink alcohol, or use illegal drugs. Some items may  interact with your medicine. What should I watch for while using this medication? Your condition will be monitored carefully while you are receiving this medication. This medication may make you feel generally unwell. This is not uncommon as chemotherapy can affect healthy cells as well as cancer cells. Report any side effects. Continue your course of treatment even though you feel ill unless your care team tells you to stop. In some cases, you may be given additional medications to help with side effects. Follow all directions for their use. This medication may increase your risk of getting an infection. Call your care team for advice if you get a fever, chills, sore throat, or other symptoms of a cold or flu. Do not treat yourself. Try to avoid being around people who are sick. This medication may increase your risk to bruise or bleed. Call your care team if you notice any unusual bleeding. Be careful brushing or flossing your teeth or using a toothpick because you may get an infection or bleed more easily. If you have any dental work done, tell your dentist you are receiving this medication. Avoid taking medications that contain aspirin, acetaminophen , ibuprofen, naproxen, or ketoprofen unless instructed by your care team. These medications may hide a fever. Do not treat diarrhea with over the counter products. Contact your care team if you have diarrhea that lasts more than 2 days or if it is severe and watery. This medication can make you more sensitive to the sun. Keep out of the sun. If you cannot avoid being in the sun, wear protective clothing and sunscreen. Do not use sun lamps, tanning beds, or tanning booths. Talk to  your care team if you or your partner wish to become pregnant or think you might be pregnant. This medication can cause serious birth defects if taken during pregnancy and for 3 months after the last dose. A reliable form of contraception is recommended while taking this  medication and for 3 months after the last dose. Talk to your care team about effective forms of contraception. Do not father a child while taking this medication and for 3 months after the last dose. Use a condom while having sex during this time period. Do not breastfeed while taking this medication. This medication may cause infertility. Talk to your care team if you are concerned about your fertility. What side effects may I notice from receiving this medication? Side effects that you should report to your care team as soon as possible: Allergic reactions--skin rash, itching, hives, swelling of the face, lips, tongue, or throat Heart attack--pain or tightness in the chest, shoulders, arms, or jaw, nausea, shortness of breath, cold or clammy skin, feeling faint or lightheaded Heart failure--shortness of breath, swelling of the ankles, feet, or hands, sudden weight gain, unusual weakness or fatigue Heart rhythm changes--fast or irregular heartbeat, dizziness, feeling faint or lightheaded, chest pain, trouble breathing High ammonia level--unusual weakness or fatigue, confusion, loss of appetite, nausea, vomiting, seizures Infection--fever, chills, cough, sore throat, wounds that don't heal, pain or trouble when passing urine, general feeling of discomfort or being unwell Low red blood cell level--unusual weakness or fatigue, dizziness, headache, trouble breathing Pain, tingling, or numbness in the hands or feet, muscle weakness, change in vision, confusion or trouble speaking, loss of balance or coordination, trouble walking, seizures Redness, swelling, and blistering of the skin over hands and feet Severe or prolonged diarrhea Unusual bruising or bleeding Side effects that usually do not require medical attention (report to your care team if they continue or are bothersome): Dry skin Headache Increased tears Nausea Pain, redness, or swelling with sores inside the mouth or throat Sensitivity  to light Vomiting This list may not describe all possible side effects. Call your doctor for medical advice about side effects. You may report side effects to FDA at 1-800-FDA-1088. Where should I keep my medication? This medication is given in a hospital or clinic. It will not be stored at home. NOTE: This sheet is a summary. It may not cover all possible information. If you have questions about this medicine, talk to your doctor, pharmacist, or health care provider.  2024 Elsevier/Gold Standard (2021-12-05 00:00:00)[facility] - A DEPT OF [facility]  Discharge Instructions: Thank you for choosing [facility] to provide your oncology and hematology care.  If you have a lab appointment with the Cancer Center, please go directly to the Cancer Center and check in at the registration area.   Wear comfortable clothing and clothing appropriate for easy access to any Portacath or PICC line.   We strive to give you quality time with your provider. You may need to reschedule your appointment if you arrive late (15 or more minutes).  Arriving late affects you and other patients whose appointments are after yours.  Also, if you miss three or more appointments without notifying the office, you may be dismissed from the clinic at the provider's discretion.      For prescription refill requests, have your pharmacy contact our office and allow 72 hours for refills to be completed.    Today you received the following chemotherapy and/or immunotherapy  agents 5FLUORUORACIL      To help prevent nausea and vomiting after your treatment, we encourage you to take your nausea medication as directed.  BELOW ARE SYMPTOMS THAT SHOULD BE REPORTED IMMEDIATELY: *FEVER GREATER THAN 100.4 F (38 C) OR HIGHER *CHILLS OR SWEATING *NAUSEA AND VOMITING THAT IS NOT CONTROLLED WITH YOUR NAUSEA MEDICATION *UNUSUAL SHORTNESS OF BREATH *UNUSUAL BRUISING OR BLEEDING *URINARY PROBLEMS  (pain or burning when urinating, or frequent urination) *BOWEL PROBLEMS (unusual diarrhea, constipation, pain near the anus) TENDERNESS IN MOUTH AND THROAT WITH OR WITHOUT PRESENCE OF ULCERS (sore throat, sores in mouth, or a toothache) UNUSUAL RASH, SWELLING OR PAIN  UNUSUAL VAGINAL DISCHARGE OR ITCHING   Items with * indicate a potential emergency and should be followed up as soon as possible or go to the Emergency Department if any problems should occur.  Please show the CHEMOTHERAPY ALERT CARD or IMMUNOTHERAPY ALERT CARD at check-in to the Emergency Department and triage nurse.  Should you have questions after your visit or need to cancel or reschedule your appointment, please contact [facility]  - A DEPT OF [facility]  Dept: [phone]  and follow the prompts.  Office hours are 8:00 a.m. to 4:30 p.m. Monday - Friday. Please note that voicemails left after 4:00 p.m. may not be returned until the following business day.  We are closed weekends and major holidays. You have access to a nurse at all times for urgent questions. Please call the main number to the clinic Dept: [phone] and follow the prompts.  For any non-urgent questions, you may also contact your provider using MyChart. We now offer e-Visits for anyone 60 and older to request care online for non-urgent symptoms. For details visit [email or web address].   Also download the MyChart app! Go to the app store, search MyChart, open the app, select [place], and log in with your MyChart username and password.

## 2024-04-24 ENCOUNTER — Telehealth: Payer: Self-pay | Admitting: Hematology and Oncology

## 2024-04-24 NOTE — Telephone Encounter (Signed)
 MRI Abdomen w/wo contrast  has been scheduled 05/05/24 @ [facility]; Checking in @ 8:45 am     LVM notifying pt/spouse of date,time, instructions and location.

## 2024-04-26 ENCOUNTER — Other Ambulatory Visit: Payer: Self-pay | Admitting: Family Medicine

## 2024-04-26 MED ORDER — ROSUVASTATIN CALCIUM 40 MG PO TABS: 40.0000 mg | ORAL_TABLET | Freq: Every day | ORAL | 1 refills | Status: DC

## 2024-04-30 ENCOUNTER — Ambulatory Visit

## 2024-04-30 VITALS — Ht 69.0 in | Wt 167.0 lb

## 2024-04-30 DIAGNOSIS — Z0000 Encounter for general adult medical examination without abnormal findings: Secondary | ICD-10-CM

## 2024-04-30 NOTE — Patient Instructions (Signed)
 [name],  Thank you for taking the time for your Medicare Wellness Visit. I appreciate your continued commitment to your health goals. Please review the care plan we discussed, and feel free to reach out if I can assist you further.  Medicare recommends these wellness visits once per year to help you and your care team stay ahead of potential health issues. These visits are designed to focus on prevention, allowing your provider to concentrate on managing your acute and chronic conditions during your regular appointments.  Please note that Annual Wellness Visits do not include a physical exam. Some assessments may be limited, especially if the visit was conducted virtually. If needed, we may recommend a separate in-person follow-up with your provider.  Ongoing Care Seeing your primary care provider every 3 to 6 months helps us  monitor your health and provide consistent, personalized care.   Referrals If a referral was made during today's visit and you haven't received any updates within two weeks, please contact the referred provider directly to check on the status.  Recommended Screenings:  Health Maintenance  Topic Date Due   DTaP/Tdap/Td vaccine (1 - Tdap) Never done   Zoster (Shingles) Vaccine (1 of 2) Never done   COVID-19 Vaccine (3 - Pfizer risk series) 02/05/2020   Eye exam for diabetics  08/17/2024*   Colon Cancer Screening  10/10/2024   Hemoglobin A1C  10/18/2024   Yearly kidney health urinalysis for diabetes  01/14/2025   Complete foot exam   04/20/2025   Yearly kidney function blood test for diabetes  04/21/2025   Medicare Annual Wellness Visit  04/30/2025   Pneumococcal Vaccine for age over 11  Completed   Flu Shot  Completed   Hepatitis C Screening  Completed   HPV Vaccine  Aged Out   Meningitis B Vaccine  Aged Out  *Topic was postponed. The date shown is not the original due date.       04/30/2024    8:40 AM  Advanced Directives  Does Patient Have a Medical  Advance Directive? Yes  Type of Advance Directive Healthcare Power of Attorney  Does patient want to make changes to medical advance directive? No - Patient declined  Would patient like information on creating a medical advance directive? No - Guardian declined   Advance Care Planning is important because it: Ensures you receive medical care that aligns with your values, goals, and preferences. Provides guidance to your family and loved ones, reducing the emotional burden of decision-making during critical moments.  Vision: Annual vision screenings are recommended for early detection of glaucoma, cataracts, and diabetic retinopathy. These exams can also reveal signs of chronic conditions such as diabetes and high blood pressure.  Dental: Annual dental screenings help detect early signs of oral cancer, gum disease, and other conditions linked to overall health, including heart disease and diabetes.  Please see the attached documents for additional preventive care recommendations.

## 2024-04-30 NOTE — Progress Notes (Signed)
 Subjective:   Joshua Nelson is a 73 y.o. who presents for a Medicare Wellness preventive visit.  As a reminder, Annual Wellness Visits don't include a physical exam, and some assessments may be limited, especially if this visit is performed virtually. We may recommend an in-person follow-up visit with your provider if needed.  Visit Complete: Virtual I connected with  Joshua Nelson on 04/30/24 by a audio enabled telemedicine application and verified that I am speaking with the correct person using two identifiers.  Patient Location: Home  Provider Location: Home Office  I discussed the limitations of evaluation and management by telemedicine. The patient expressed understanding and agreed to proceed.  Vital Signs: Because this visit was a virtual/telehealth visit, some criteria may be missing or patient reported. Any vitals not documented were not able to be obtained and vitals that have been documented are patient reported.  VideoDeclined- This patient declined Librarian, academic. Therefore the visit was completed with audio only.  Persons Participating in Visit: Patient.  AWV Questionnaire: No: Patient Medicare AWV questionnaire was not completed prior to this visit.  Cardiac Risk Factors include: advanced age (>34men, >20 women);diabetes mellitus;male gender;hypertension     Objective:    Today's Vitals   04/30/24 0838  Weight: 167 lb (75.8 kg)  Height: 5' 9 (1.753 m)   Body mass index is 24.66 kg/m.     04/30/2024    8:40 AM 04/23/2024    2:09 PM 04/21/2024    9:08 AM 04/07/2024    9:39 AM 03/24/2024   11:00 AM 03/12/2024    2:44 PM 03/10/2024   10:00 AM  Advanced Directives  Does Patient Have a Medical Advance Directive? Yes Yes Yes No Yes No Yes  Type of Sales promotion account executive of [employer] Power of [employer] Power of Attorney  Healthcare Power of Attorney  Does patient want to  make changes to medical advance directive? No - Patient declined No - Patient declined       Copy of Healthcare Power of Attorney in Chart?       No - copy requested  Would patient like information on creating a medical advance directive? No - Guardian declined No - Patient declined         Current Medications (verified) Outpatient Encounter Medications as of 04/30/2024  Medication Sig   aspirin EC 81 MG tablet Take 81 mg by mouth daily. Swallow whole.   Continuous Glucose Receiver (FREESTYLE LIBRE READER) DEVI Freestyle libre reader that works with freestyle libre 3+   Continuous Glucose Sensor (FREESTYLE LIBRE 3 PLUS SENSOR) MISC Change sensor every 15 days.   ezetimibe  (ZETIA ) 10 MG tablet TAKE ONE (1) TABLET ONCE DAILY   FARXIGA  10 MG TABS tablet TAKE 1 TABLET BY MOUTH ONCE DAILY IN THE MORNING AS DIRECTED.   gabapentin  (NEURONTIN ) 300 MG capsule Take 1 capsule (300 mg total) by mouth 3 (three) times daily.   Glucagon  (GVOKE HYPOPEN  2-PACK) 0.5 MG/0.1ML SOAJ Inject 0.5 mg into the skin daily as needed.   lisinopril  (ZESTRIL ) 10 MG tablet Take 1 tablet (10 mg total) by mouth daily.   meclizine  (ANTIVERT ) 25 MG tablet Take 1 tablet (25 mg total) by mouth 3 (three) times daily as needed for dizziness.   metoprolol  succinate (TOPROL -XL) 25 MG 24 hr tablet TAKE 1 TABLET BY MOUTH ONCE DAILY IN THE MORNING.   OLANZapine  (ZYPREXA ) 5 MG tablet One tab at bedtime prn nauese  ondansetron  (ZOFRAN ) 8 MG tablet Take 1 tablet (8 mg total) by mouth every 8 (eight) hours as needed for nausea or vomiting.   prochlorperazine  (COMPAZINE ) 10 MG tablet Take 1 tablet (10 mg total) by mouth every 6 (six) hours as needed for nausea or vomiting.   Pseudoeph-Doxylamine-DM-APAP (NYQUIL PO) Take by mouth as needed.   rosuvastatin  (CRESTOR ) 40 MG tablet Take 1 tablet (40 mg total) by mouth at bedtime.   Semaglutide , 1 MG/DOSE, 4 MG/3ML SOPN Inject 1 mg as directed once a week.   Facility-Administered Encounter  Medications as of 04/30/2024  Medication   sodium chloride  flush (NS) 0.9 % injection 10 mL    Allergies (verified) Patient has no known allergies.   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   PONV (postoperative nausea and vomiting)    Prostate cancer (HCC) 07/2021   Past Surgical History:  Procedure Laterality Date   CARDIAC CATHETERIZATION     THREE CARDIAC STENTS PLACED   COLONOSCOPY W/ POLYPECTOMY     CYSTOSCOPY WITH URETHRAL DILATATION N/A 08/01/2021   Procedure: CYSTOSCOPY WITH BALLOON URETHRAL DILATATION;  Surgeon: Elisabeth Valli BIRCH, MD;  Location: [facility];  Service: Urology;  Laterality: N/A;   left cataract  10/2019   NOSE SURGERY     RESULT OF DOG BITE DURING CHILDHOOD   PROSTATE BIOPSY     TRANSURETHRAL RESECTION OF BLADDER TUMOR N/A 08/01/2021   Procedure: TRANSURETHRAL RESECTION OF PROSTATE;  Surgeon: Elisabeth Valli BIRCH, MD;  Location: [facility];  Service: Urology;  Laterality: N/A;  90 MINS   URETHRAL STRICTURE DILATATION     Family History  Problem Relation Age of Onset   Endometrial cancer Mother    CAD Mother    AAA (abdominal aortic aneurysm) Mother    Dementia Father    Anxiety disorder Father    Depression Father    Heart disease Father    Hypertension Father    Deep vein thrombosis Father    Kidney Stones Sister        H/O : INTESTINAL BLOCKAGE   Barrett's esophagus Sister    Hyperlipidemia Sister    Kidney Stones Brother    Heart attack Brother    Colon cancer Neg Hx    Esophageal cancer Neg Hx    Liver cancer Neg Hx    Stomach cancer Neg Hx    Social History   Socioeconomic History   Marital  status: Married    Spouse name: Nathanel   Number of children: 1   Years of education: 12   Highest education level: 12th grade  Occupational History   Occupation: RETIRED - TRUCK DRIVER  Tobacco Use   Smoking status: Former    Current packs/day: 0.00    Types: Cigarettes    Quit date: 2003    Years since quitting: 22.7   Smokeless tobacco: Never  Vaping Use   Vaping status: Never Used  Substance and Sexual Activity   Alcohol use: Not Currently    Comment: none since 2000   Drug use: Never   Sexual activity: Not Currently  Other Topics Concern   Not on file  Social History Narrative   Lives at home with his wife.   2 cups caffeine per day.   Right-handed.   Social Drivers of Corporate investment banker Strain: Low Risk  (04/30/2024)   Overall Financial Resource Strain (CARDIA)    Difficulty of Paying Living Expenses: Not hard at all  Food Insecurity: No Food Insecurity (04/30/2024)   Hunger Vital Sign    Worried About Running Out of Food in the Last Year: Never true    Ran Out of Food in the Last Year: Never true  Transportation Needs: No Transportation Needs (04/30/2024)   PRAPARE - Administrator, Civil Service (Medical): No    Lack of Transportation (Non-Medical): No  Physical Activity: Inactive (04/30/2024)   Exercise Vital Sign    Days of Exercise per Week: 0 days    Minutes of Exercise per Session: 0 min  Stress: No Stress Concern Present (04/30/2024)   [employer] of Occupational Health - Occupational Stress Questionnaire    Feeling of Stress: Only a little  Social Connections: Socially Integrated (04/30/2024)   Social Connection and Isolation Panel    Frequency of Communication with Friends and Family: More than three times a week    Frequency of Social Gatherings with Friends and Family: More than three times a week    Attends Religious Services: More than 4 times per year    Active Member of [employer] or Organizations: Yes    Attends Museum/gallery exhibitions officer: More than 4 times per year    Marital Status: Married    Tobacco Counseling Counseling given: Not Answered    Clinical Intake:  Pre-visit preparation completed: Yes  Pain : No/denies pain  Diabetes: Yes CBG done?: No Did pt. bring in CBG monitor from home?: No  Lab Results  Component Value Date   HGBA1C 5.0 04/20/2024   HGBA1C 6.2 (H) 01/13/2024   HGBA1C 5.9 (H) 09/16/2023     How often do you need to have someone help you when you read instructions, pamphlets, or other written materials from your doctor or pharmacy?: 1 - Never  Interpreter Needed?: No  Information entered by :: Charmaine Bloodgood LPN   Activities of Daily Living     04/30/2024    8:39 AM  In your present state of health, do you have any difficulty performing the following activities:  Hearing? 0  Vision? 0  Difficulty concentrating or making decisions? 0  Walking or climbing stairs? 0  Dressing or bathing? 0  Doing errands, shopping? 0  Preparing Food and eating ? N  Using the Toilet? N  In the past six months, have you accidently leaked urine? N  Do you have problems with loss of bowel control? N  Managing your Medications? N  Managing your Finances? N  Housekeeping or managing your Housekeeping? N    Patient Care Team: Sherre Clapper, MD as PCP - General (Family Medicine) Elisabeth Valli BIRCH, MD as Consulting Physician (Urology) Corlis Sharlet PARAS, RN as Case Manager (General Practice) Ezzard Valaria LABOR, MD as Consulting Physician (Oncology) Jomarie Agent, MD as Consulting Physician (Radiation Oncology) [facility] [place], [place]  I have updated your Care Teams any recent Medical Services you may have received from other providers in the past year.     Assessment:   This is a routine wellness examination for [facility].  Hearing/Vision screen Hearing Screening - Comments:: Denies hearing difficulties   Vision Screening - Comments:: No vision problems; will schedule routine  eye  exam/diabetic eye exam    Goals Addressed               This Visit's Progress     Stay Healthy (pt-stated)   On track      Depression Screen     04/30/2024    8:49 AM 04/21/2024    9:00 AM 04/07/2024    9:00 AM 03/26/2024    3:19 PM 03/24/2024   11:00 AM 03/10/2024   10:05 AM 02/27/2024    3:30 PM  PHQ 2/9 Scores  PHQ - 2 Score 0 0 0 0 0 0 0    Fall Risk     04/30/2024    8:48 AM 09/21/2023    4:55 PM 03/21/2023    8:50 AM 02/13/2023   11:27 AM 09/11/2022    9:21 AM  Fall Risk   Falls in the past year? 0 0 0 1 0  Number falls in past yr: 0 0 0 1 0  Injury with Fall? 0 0 0 0 0  Risk for fall due to : No Fall Risks No Fall Risks No Fall Risks No Fall Risks No Fall Risks  Follow up Falls prevention discussed;Education provided;Falls evaluation completed Falls evaluation completed Falls prevention discussed Follow up appointment Falls evaluation completed    MEDICARE RISK AT HOME:  Medicare Risk at Home Any stairs in or around the home?: No If so, are there any without handrails?: No Home free of loose throw rugs in walkways, pet beds, electrical cords, etc?: Yes Adequate lighting in your home to reduce risk of falls?: Yes Life alert?: No Use of a cane, walker or w/c?: No Grab bars in the bathroom?: Yes Shower chair or bench in shower?: No Elevated toilet seat or a handicapped toilet?: Yes  TIMED UP AND GO:  Was the test performed?  No  Cognitive Function: 6CIT completed        04/30/2024    8:50 AM 03/21/2023    8:51 AM 12/21/2021   12:18 PM 07/12/2020    3:04 PM  6CIT Screen  What Year? 0 points 0 points 0 points 0 points  What month? 0 points 0 points 0 points 0 points  What time? 0 points 0 points 0 points 0 points  Count back from 20 0 points 0 points 0 points 0 points  Months in reverse 0 points 0 points 0 points 0 points  Repeat phrase 0 points 0 points 0 points 0 points  Total Score 0 points 0 points 0 points 0 points    Immunizations Immunization  History  Administered Date(s) Administered   Fluad Quad(high Dose 65+) 06/28/2020, 04/25/2021, 05/21/2022   Fluad Trivalent(High Dose 65+) 06/06/2023   Influenza, Seasonal, Injecte, Preservative Fre 04/20/2024   PFIZER(Purple Top)SARS-COV-2 Vaccination 12/10/2019, 01/08/2020   PNEUMOCOCCAL CONJUGATE-20 05/26/2021   Pneumococcal Polysaccharide-23 06/28/2020    Screening Tests Health Maintenance  Topic Date Due   DTaP/Tdap/Td (1 - Tdap) Never done   Zoster Vaccines- Shingrix (1 of 2) Never done   COVID-19 Vaccine (3 - Pfizer risk series) 02/05/2020   OPHTHALMOLOGY EXAM  08/17/2024 (Originally 11/11/2020)   Colonoscopy  10/10/2024   HEMOGLOBIN A1C  10/18/2024   Diabetic kidney evaluation - Urine ACR  01/14/2025   FOOT EXAM  04/20/2025   Diabetic kidney evaluation - eGFR measurement  04/21/2025   Medicare Annual Wellness (AWV)  04/30/2025   Pneumococcal Vaccine: 50+ Years  Completed   Influenza Vaccine  Completed   Hepatitis C Screening  Completed   HPV VACCINES  Aged Out   Meningococcal B Vaccine  Aged Out    Health Maintenance Items Addressed: Information provided on vaccine recommendations   Additional Screening:  Vision Screening: Recommended annual ophthalmology exams for early detection of glaucoma and other disorders of the eye. Is the patient up to date with their annual eye exam?  No  Who is the provider or what is the name of the office in which the patient attends annual eye exams? None   Dental Screening: Recommended annual dental exams for proper oral hygiene  Community Resource Referral / Chronic Care Management: CRR required this visit?  No   CCM required this visit?  No   Plan:    I have personally reviewed and noted the following in the patient's chart:   Medical and social history Use of alcohol, tobacco or illicit drugs  Current medications and supplements including opioid prescriptions. Patient is not currently taking opioid  prescriptions. Functional ability and status Nutritional status Physical activity Advanced directives List of other physicians Hospitalizations, surgeries, and ER visits in previous 12 months Vitals Screenings to include cognitive, depression, and falls Referrals and appointments  In addition, I have reviewed and discussed with patient certain preventive protocols, quality metrics, and best practice recommendations. A written personalized care plan for preventive services as well as general preventive health recommendations were provided to patient.   Lavelle Pfeiffer [place], [place]   [id number]   After Visit Summary: (MyChart) Due to this being a telephonic visit, the after visit summary with patients personalized plan was offered to patient via MyChart   Notes: Nothing significant to report at this time.

## 2024-05-01 ENCOUNTER — Other Ambulatory Visit: Payer: Self-pay

## 2024-05-05 ENCOUNTER — Ambulatory Visit: Admitting: Oncology

## 2024-05-05 ENCOUNTER — Other Ambulatory Visit

## 2024-05-05 ENCOUNTER — Other Ambulatory Visit: Payer: Self-pay

## 2024-05-05 ENCOUNTER — Ambulatory Visit

## 2024-05-05 DIAGNOSIS — C787 Secondary malignant neoplasm of liver and intrahepatic bile duct: Secondary | ICD-10-CM | POA: Diagnosis not present

## 2024-05-05 DIAGNOSIS — C20 Malignant neoplasm of rectum: Secondary | ICD-10-CM | POA: Diagnosis not present

## 2024-05-05 NOTE — Progress Notes (Unsigned)
 [facility]  [address] [place],  [place]  [address] [phone]  Clinic Day:  05/06/2024  Referring physician: Sherre Clapper, MD   HISTORY OF PRESENT ILLNESS:  The patient is a 73 y.o. male with  metastatic rectal cancer, including spread of disease to his liver.  He comes in today to go over his MRI images to ascertain his new disease baseline after receiving 10 cycles of FOLFOX/Avastin .  He states he tolerated his 10th cycle okay.  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  He admits to dropping items more commonly than previously.  He also has significant difficulties buttoning his shirt, zipping his pants, or tying his shoes.  With respect to his metastatic rectal cancer, he denies having any new symptoms/findings which concern him for disease progression while on his current chemotherapy.    VITALS:  Blood pressure 135/76, pulse 75, temperature 98.2 F (36.8 C), temperature source Oral, resp. rate 16, height 5' 9 (1.753 m), weight 164 lb 8 oz (74.6 kg), SpO2 100%. Wt Readings from Last 3 Encounters:  05/06/24 164 lb 8 oz (74.6 kg)  04/30/24 167 lb (75.8 kg)  04/21/24 167 lb 8 oz (76 kg)   Body mass index is 24.29 kg/m.  Performance status (ECOG): 1 - Symptomatic but completely ambulatory  PHYSICAL EXAM:   Physical Exam Vitals and nursing note reviewed.  Constitutional:      General: He is not in acute distress.    Appearance: Normal appearance. He is normal weight.  HENT:     Head: Normocephalic and atraumatic.     Mouth/Throat:     Mouth: Mucous membranes are moist.     Pharynx: Oropharynx is clear. No oropharyngeal exudate or posterior oropharyngeal erythema.  Eyes:     General: No scleral icterus.    Extraocular Movements: Extraocular movements intact.     Conjunctiva/sclera: Conjunctivae normal.     Pupils: Pupils are equal, round, and reactive to light.  Cardiovascular:     Rate and Rhythm: Normal rate and regular  rhythm.     Heart sounds: Normal heart sounds. No murmur heard.    No friction rub. No gallop.  Pulmonary:     Effort: Pulmonary effort is normal.     Breath sounds: Normal breath sounds. No wheezing, rhonchi or rales.  Abdominal:     General: Bowel sounds are normal. There is no distension.     Palpations: Abdomen is soft. There is no hepatomegaly, splenomegaly or mass.     Tenderness: There is no abdominal tenderness.  Musculoskeletal:        General: Normal range of motion.     Cervical back: Normal range of motion and neck supple. No tenderness.     Right lower leg: No edema.     Left lower leg: No edema.  Lymphadenopathy:     Cervical: No cervical adenopathy.     Upper Body:     Right upper body: No supraclavicular or axillary adenopathy.     Left upper body: No supraclavicular or axillary adenopathy.     Lower Body: No right inguinal adenopathy. No left inguinal adenopathy.  Skin:    General: Skin is warm and dry.     Coloration: Skin is not jaundiced.     Findings: No rash.  Neurological:     Mental Status: He is alert and oriented to person, place, and time.     Cranial Nerves: No cranial nerve deficit.  Psychiatric:  Mood and Affect: Mood normal.        Behavior: Behavior normal.        Thought Content: Thought content normal.   SCANS: The MRI of his abdomen done yesterday revealed the following:    LABS:      Latest Ref Rng & Units 05/06/2024    8:47 AM 04/21/2024    8:24 AM 04/07/2024    8:55 AM  CBC  WBC 4.0 - 10.5 K/uL 4.7  4.5  4.6   Hemoglobin 13.0 - 17.0 g/dL 9.9  89.7  9.8   Hematocrit 39.0 - 52.0 % 30.5  32.0  30.7   Platelets 150 - 400 K/uL 101  89  85       Latest Ref Rng & Units 05/06/2024    8:47 AM 04/21/2024    8:24 AM 04/07/2024    8:55 AM  CMP  Glucose 70 - 99 mg/dL 875  829  890   BUN 8 - 23 mg/dL 14  13  14    Creatinine 0.61 - 1.24 mg/dL 8.90  9.20  9.39   Sodium 135 - 145 mmol/L 140  139  139   Potassium 3.5 - 5.1 mmol/L 4.4  4.2   4.2   Chloride 98 - 111 mmol/L 106  104  104   CO2 22 - 32 mmol/L 24  24  24    Calcium  8.9 - 10.3 mg/dL 9.2  9.3  9.5   Total Protein 6.5 - 8.1 g/dL 6.8  6.8  6.9   Total Bilirubin 0.0 - 1.2 mg/dL 0.7  0.7  0.8   Alkaline Phos 38 - 126 U/L 51  56  52   AST 15 - 41 U/L 20  22  24    ALT 0 - 44 U/L 9  13  15       Lab Results  Component Value Date   CEA 7.04 (H) 02/06/2024   CEA 13.98 (H) 10/28/2023    ASSESSMENT & PLAN:  Assessment/Plan:  A 73 y.o. male with metastatic rectal cancer including spread of his disease to his liver.  In clinic today, I went over all of his MRI images with him and compared them to those of his last MRI in July 2025.  There continues to be a gradual improvement in the metastatic disease burden within his liver.  The patient can see that his metastatic lesions remain very small.  It still appears that over 95% of his liver is uninvolved with cancer despite there being multiple small lesions throughout.  He will proceed with his 11th cycle of FOLFOX/Avastin  today.  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  The patient understands there is a high likelihood that his oxaliplatin  will  ultimately be discontinued altogether, particularly if his peripheral neuropathy progresses over time.  As his hemoglobin is less than 10 today, he will receive Retacrit  20,000 units.  Overall, he appears to be doing fairly well.  I will see him back in 2 weeks before he heads into his 12th cycle of FOLFOX/Avastin . The patient understands all the plans discussed today and is in agreement with them.  Deshia Vanderhoof DELENA Kerns, MD

## 2024-05-06 ENCOUNTER — Other Ambulatory Visit: Payer: Self-pay | Admitting: Pharmacist

## 2024-05-06 ENCOUNTER — Inpatient Hospital Stay

## 2024-05-06 ENCOUNTER — Inpatient Hospital Stay (HOSPITAL_BASED_OUTPATIENT_CLINIC_OR_DEPARTMENT_OTHER): Admitting: Oncology

## 2024-05-06 ENCOUNTER — Encounter: Payer: Self-pay | Admitting: Oncology

## 2024-05-06 VITALS — BP 135/76 | HR 75 | Temp 98.2°F | Resp 16 | Ht 69.0 in | Wt 164.5 lb

## 2024-05-06 DIAGNOSIS — C20 Malignant neoplasm of rectum: Secondary | ICD-10-CM

## 2024-05-06 DIAGNOSIS — Z5111 Encounter for antineoplastic chemotherapy: Secondary | ICD-10-CM | POA: Diagnosis not present

## 2024-05-06 DIAGNOSIS — C787 Secondary malignant neoplasm of liver and intrahepatic bile duct: Secondary | ICD-10-CM

## 2024-05-06 DIAGNOSIS — Z452 Encounter for adjustment and management of vascular access device: Secondary | ICD-10-CM

## 2024-05-06 LAB — CMP (CANCER CENTER ONLY)
ALT: 9 U/L (ref 0–44)
AST: 20 U/L (ref 15–41)
Albumin: 3.8 g/dL (ref 3.5–5.0)
Alkaline Phosphatase: 51 U/L (ref 38–126)
Anion gap: 11 (ref 5–15)
BUN: 14 mg/dL (ref 8–23)
CO2: 24 mmol/L (ref 22–32)
Calcium: 9.2 mg/dL (ref 8.9–10.3)
Chloride: 106 mmol/L (ref 98–111)
Creatinine: 1.09 mg/dL (ref 0.61–1.24)
GFR, Estimated: >60 mL/min (ref >60)
Glucose, Bld: 124 mg/dL — ABNORMAL HIGH (ref 70–99)
Potassium: 4.4 mmol/L (ref 3.5–5.1)
Sodium: 140 mmol/L (ref 135–145)
Total Bilirubin: 0.7 mg/dL (ref 0.0–1.2)
Total Protein: 6.8 g/dL (ref 6.5–8.1)

## 2024-05-06 LAB — CBC WITH DIFFERENTIAL (CANCER CENTER ONLY)
Abs Immature Granulocytes: 0.02 K/uL (ref 0.00–0.07)
Basophils Absolute: 0 K/uL (ref 0.0–0.1)
Basophils Relative: 0 %
Eosinophils Absolute: 0.1 K/uL (ref 0.0–0.5)
Eosinophils Relative: 2 %
HCT: 30.5 % — ABNORMAL LOW (ref 39.0–52.0)
Hemoglobin: 9.9 g/dL — ABNORMAL LOW (ref 13.0–17.0)
Immature Granulocytes: 0 %
Lymphocytes Relative: 19 %
Lymphs Abs: 0.9 K/uL (ref 0.7–4.0)
MCH: 30.8 pg (ref 26.0–34.0)
MCHC: 32.5 g/dL (ref 30.0–36.0)
MCV: 95 fL (ref 80.0–100.0)
Monocytes Absolute: 0.5 K/uL (ref 0.1–1.0)
Monocytes Relative: 10 %
Neutro Abs: 3.2 K/uL (ref 1.7–7.7)
Neutrophils Relative %: 69 %
Platelet Count: 101 K/uL — ABNORMAL LOW (ref 150–400)
RBC: 3.21 MIL/uL — ABNORMAL LOW (ref 4.22–5.81)
RDW: 17.2 % — ABNORMAL HIGH (ref 11.5–15.5)
WBC Count: 4.7 K/uL (ref 4.0–10.5)
nRBC: 0 % (ref 0.0–0.2)

## 2024-05-06 LAB — TOTAL PROTEIN, URINE DIPSTICK: Protein, ur: 30 mg/dL — AB

## 2024-05-06 MED ORDER — FAMOTIDINE IN NACL 20-0.9 MG/50ML-% IV SOLN
20.0000 mg | Freq: Once | INTRAVENOUS | Status: AC
  Administered 2024-05-06: 20 mg via INTRAVENOUS
  Filled 2024-05-06: qty 50

## 2024-05-06 MED ORDER — EPOETIN ALFA-EPBX 20000 UNIT/ML IJ SOLN
20000.0000 [IU] | Freq: Once | INTRAMUSCULAR | Status: AC
  Administered 2024-05-06: 20000 [IU] via SUBCUTANEOUS
  Filled 2024-05-06: qty 1

## 2024-05-06 MED ORDER — OXALIPLATIN CHEMO INJECTION 100 MG/20ML
35.0000 mg/m2 | Freq: Once | INTRAVENOUS | Status: AC
  Administered 2024-05-06: 70 mg via INTRAVENOUS
  Filled 2024-05-06: qty 14

## 2024-05-06 MED ORDER — SODIUM CHLORIDE 0.9 % IV SOLN
5.0000 mg/kg | Freq: Once | INTRAVENOUS | Status: AC
  Administered 2024-05-06: 400 mg via INTRAVENOUS
  Filled 2024-05-06: qty 16

## 2024-05-06 MED ORDER — SODIUM CHLORIDE 0.9 % IV SOLN: INTRAVENOUS | Status: DC

## 2024-05-06 MED ORDER — SODIUM CHLORIDE 0.9 % IV SOLN
1920.0000 mg/m2 | INTRAVENOUS | Status: DC
  Administered 2024-05-06: 3900 mg via INTRAVENOUS
  Filled 2024-05-06: qty 78

## 2024-05-06 MED ORDER — DIPHENHYDRAMINE HCL 50 MG/ML IJ SOLN
25.0000 mg | Freq: Once | INTRAMUSCULAR | Status: AC
  Administered 2024-05-06: 25 mg via INTRAVENOUS
  Filled 2024-05-06: qty 1

## 2024-05-06 MED ORDER — DEXAMETHASONE SODIUM PHOSPHATE 10 MG/ML IJ SOLN
10.0000 mg | Freq: Once | INTRAMUSCULAR | Status: AC
  Administered 2024-05-06: 10 mg via INTRAVENOUS
  Filled 2024-05-06: qty 1

## 2024-05-06 MED ORDER — FLUOROURACIL CHEMO INJECTION 2.5 GM/50ML
320.0000 mg/m2 | Freq: Once | INTRAVENOUS | Status: AC
  Administered 2024-05-06: 650 mg via INTRAVENOUS
  Filled 2024-05-06: qty 13

## 2024-05-06 MED ORDER — DEXTROSE 5 % IV SOLN: INTRAVENOUS | Status: DC

## 2024-05-06 MED ORDER — PALONOSETRON HCL INJECTION 0.25 MG/5ML
0.2500 mg | Freq: Once | INTRAVENOUS | Status: AC
  Administered 2024-05-06: 0.25 mg via INTRAVENOUS
  Filled 2024-05-06: qty 5

## 2024-05-06 MED ORDER — LEUCOVORIN CALCIUM INJECTION 350 MG
320.0000 mg/m2 | Freq: Once | INTRAVENOUS | Status: AC
  Administered 2024-05-06: 652 mg via INTRAVENOUS
  Filled 2024-05-06: qty 32.6

## 2024-05-06 NOTE — Patient Instructions (Signed)
 Fluorouracil Injection What is this medication? FLUOROURACIL (flure oh YOOR a sil) treats some types of cancer. It works by slowing down the growth of cancer cells. This medicine may be used for other purposes; ask your health care provider or pharmacist if you have questions. COMMON BRAND NAME(S): Adrucil What should I tell my care team before I take this medication? They need to know if you have any of these conditions: Blood disorders Dihydropyrimidine dehydrogenase (DPD) deficiency Infection, such as chickenpox, cold sores, herpes Kidney disease Liver disease Poor nutrition Recent or ongoing radiation therapy An unusual or allergic reaction to fluorouracil, other medications, foods, dyes, or preservatives If you or your partner are pregnant or trying to get pregnant Breast-feeding How should I use this medication? This medication is injected into a vein. It is administered by your care team in a hospital or clinic setting. Talk to your care team about the use of this medication in children. Special care may be needed. Overdosage: If you think you have taken too much of this medicine contact a poison control center or emergency room at once. NOTE: This medicine is only for you. Do not share this medicine with others. What if I miss a dose? Keep appointments for follow-up doses. It is important not to miss your dose. Call your care team if you are unable to keep an appointment. What may interact with this medication? Do not take this medication with any of the following: Live virus vaccines This medication may also interact with the following: Medications that treat or prevent blood clots, such as warfarin, enoxaparin, dalteparin This list may not describe all possible interactions. Give your health care provider a list of all the medicines, herbs, non-prescription drugs, or dietary supplements you use. Also tell them if you smoke, drink alcohol, or use illegal drugs. Some items may  interact with your medicine. What should I watch for while using this medication? Your condition will be monitored carefully while you are receiving this medication. This medication may make you feel generally unwell. This is not uncommon as chemotherapy can affect healthy cells as well as cancer cells. Report any side effects. Continue your course of treatment even though you feel ill unless your care team tells you to stop. In some cases, you may be given additional medications to help with side effects. Follow all directions for their use. This medication may increase your risk of getting an infection. Call your care team for advice if you get a fever, chills, sore throat, or other symptoms of a cold or flu. Do not treat yourself. Try to avoid being around people who are sick. This medication may increase your risk to bruise or bleed. Call your care team if you notice any unusual bleeding. Be careful brushing or flossing your teeth or using a toothpick because you may get an infection or bleed more easily. If you have any dental work done, tell your dentist you are receiving this medication. Avoid taking medications that contain aspirin, acetaminophen, ibuprofen, naproxen, or ketoprofen unless instructed by your care team. These medications may hide a fever. Do not treat diarrhea with over the counter products. Contact your care team if you have diarrhea that lasts more than 2 days or if it is severe and watery. This medication can make you more sensitive to the sun. Keep out of the sun. If you cannot avoid being in the sun, wear protective clothing and sunscreen. Do not use sun lamps, tanning beds, or tanning booths. Talk to  your care team if you or your partner wish to become pregnant or think you might be pregnant. This medication can cause serious birth defects if taken during pregnancy and for 3 months after the last dose. A reliable form of contraception is recommended while taking this  medication and for 3 months after the last dose. Talk to your care team about effective forms of contraception. Do not father a child while taking this medication and for 3 months after the last dose. Use a condom while having sex during this time period. Do not breastfeed while taking this medication. This medication may cause infertility. Talk to your care team if you are concerned about your fertility. What side effects may I notice from receiving this medication? Side effects that you should report to your care team as soon as possible: Allergic reactions--skin rash, itching, hives, swelling of the face, lips, tongue, or throat Heart attack--pain or tightness in the chest, shoulders, arms, or jaw, nausea, shortness of breath, cold or clammy skin, feeling faint or lightheaded Heart failure--shortness of breath, swelling of the ankles, feet, or hands, sudden weight gain, unusual weakness or fatigue Heart rhythm changes--fast or irregular heartbeat, dizziness, feeling faint or lightheaded, chest pain, trouble breathing High ammonia level--unusual weakness or fatigue, confusion, loss of appetite, nausea, vomiting, seizures Infection--fever, chills, cough, sore throat, wounds that don't heal, pain or trouble when passing urine, general feeling of discomfort or being unwell Low red blood cell level--unusual weakness or fatigue, dizziness, headache, trouble breathing Pain, tingling, or numbness in the hands or feet, muscle weakness, change in vision, confusion or trouble speaking, loss of balance or coordination, trouble walking, seizures Redness, swelling, and blistering of the skin over hands and feet Severe or prolonged diarrhea Unusual bruising or bleeding Side effects that usually do not require medical attention (report to your care team if they continue or are bothersome): Dry skin Headache Increased tears Nausea Pain, redness, or swelling with sores inside the mouth or throat Sensitivity  to light Vomiting This list may not describe all possible side effects. Call your doctor for medical advice about side effects. You may report side effects to FDA at 1-800-FDA-1088. Where should I keep my medication? This medication is given in a hospital or clinic. It will not be stored at home. NOTE: This sheet is a summary. It may not cover all possible information. If you have questions about this medicine, talk to your doctor, pharmacist, or health care provider.  2024 Elsevier/Gold Standard (2021-12-05 00:00:00)Leucovorin Injection What is this medication? LEUCOVORIN (loo koe VOR in) prevents side effects from certain medications, such as methotrexate. It works by increasing folate levels. This helps protect healthy cells in your body. It may also be used to treat anemia caused by low levels of folate. It can also be used with fluorouracil, a type of chemotherapy, to treat colorectal cancer. It works by increasing the effects of fluorouracil in the body. This medicine may be used for other purposes; ask your health care provider or pharmacist if you have questions. What should I tell my care team before I take this medication? They need to know if you have any of these conditions: Anemia from low levels of vitamin B12 in the blood An unusual or allergic reaction to leucovorin, folic acid, other medications, foods, dyes, or preservatives Pregnant or trying to get pregnant Breastfeeding How should I use this medication? This medication is injected into a vein or a muscle. It is given by your care team in  a hospital or clinic setting. Talk to your care team about the use of this medication in children. Special care may be needed. Overdosage: If you think you have taken too much of this medicine contact a poison control center or emergency room at once. NOTE: This medicine is only for you. Do not share this medicine with others. What if I miss a dose? Keep appointments for follow-up doses.  It is important not to miss your dose. Call your care team if you are unable to keep an appointment. What may interact with this medication? Capecitabine Fluorouracil Phenobarbital Phenytoin Primidone Trimethoprim;sulfamethoxazole This list may not describe all possible interactions. Give your health care provider a list of all the medicines, herbs, non-prescription drugs, or dietary supplements you use. Also tell them if you smoke, drink alcohol, or use illegal drugs. Some items may interact with your medicine. What should I watch for while using this medication? Your condition will be monitored carefully while you are receiving this medication. This medication may increase the side effects of 5-fluorouracil. Tell your care team if you have diarrhea or mouth sores that do not get better or that get worse. What side effects may I notice from receiving this medication? Side effects that you should report to your care team as soon as possible: Allergic reactions--skin rash, itching, hives, swelling of the face, lips, tongue, or throat This list may not describe all possible side effects. Call your doctor for medical advice about side effects. You may report side effects to FDA at 1-800-FDA-1088. Where should I keep my medication? This medication is given in a hospital or clinic. It will not be stored at home. NOTE: This sheet is a summary. It may not cover all possible information. If you have questions about this medicine, talk to your doctor, pharmacist, or health care provider.  2024 Elsevier/Gold Standard (2022-01-02 00:00:00)Oxaliplatin Injection What is this medication? OXALIPLATIN (ox AL i PLA tin) treats colorectal cancer. It works by slowing down the growth of cancer cells. This medicine may be used for other purposes; ask your health care provider or pharmacist if you have questions. COMMON BRAND NAME(S): Eloxatin What should I tell my care team before I take this medication? They  need to know if you have any of these conditions: Heart disease History of irregular heartbeat or rhythm Liver disease Low blood cell levels (white cells, red cells, and platelets) Lung or breathing disease, such as asthma Take medications that treat or prevent blood clots Tingling of the fingers, toes, or other nerve disorder An unusual or allergic reaction to oxaliplatin, other medications, foods, dyes, or preservatives If you or your partner are pregnant or trying to get pregnant Breast-feeding How should I use this medication? This medication is injected into a vein. It is given by your care team in a hospital or clinic setting. Talk to your care team about the use of this medication in children. Special care may be needed. Overdosage: If you think you have taken too much of this medicine contact a poison control center or emergency room at once. NOTE: This medicine is only for you. Do not share this medicine with others. What if I miss a dose? Keep appointments for follow-up doses. It is important not to miss a dose. Call your care team if you are unable to keep an appointment. What may interact with this medication? Do not take this medication with any of the following: Cisapride Dronedarone Pimozide Thioridazine This medication may also interact with the following:  Aspirin and aspirin-like medications Certain medications that treat or prevent blood clots, such as warfarin, apixaban, dabigatran, and rivaroxaban Cisplatin Cyclosporine Diuretics Medications for infection, such as acyclovir, adefovir, amphotericin B, bacitracin, cidofovir, foscarnet, ganciclovir, gentamicin, pentamidine, vancomycin NSAIDs, medications for pain and inflammation, such as ibuprofen or naproxen Other medications that cause heart rhythm changes Pamidronate Zoledronic acid This list may not describe all possible interactions. Give your health care provider a list of all the medicines, herbs,  non-prescription drugs, or dietary supplements you use. Also tell them if you smoke, drink alcohol, or use illegal drugs. Some items may interact with your medicine. What should I watch for while using this medication? Your condition will be monitored carefully while you are receiving this medication. You may need blood work while taking this medication. This medication may make you feel generally unwell. This is not uncommon as chemotherapy can affect healthy cells as well as cancer cells. Report any side effects. Continue your course of treatment even though you feel ill unless your care team tells you to stop. This medication may increase your risk of getting an infection. Call your care team for advice if you get a fever, chills, sore throat, or other symptoms of a cold or flu. Do not treat yourself. Try to avoid being around people who are sick. Avoid taking medications that contain aspirin, acetaminophen, ibuprofen, naproxen, or ketoprofen unless instructed by your care team. These medications may hide a fever. Be careful brushing or flossing your teeth or using a toothpick because you may get an infection or bleed more easily. If you have any dental work done, tell your dentist you are receiving this medication. This medication can make you more sensitive to cold. Do not drink cold drinks or use ice. Cover exposed skin before coming in contact with cold temperatures or cold objects. When out in cold weather wear warm clothing and cover your mouth and nose to warm the air that goes into your lungs. Tell your care team if you get sensitive to the cold. Talk to your care team if you or your partner are pregnant or think either of you might be pregnant. This medication can cause serious birth defects if taken during pregnancy and for 9 months after the last dose. A negative pregnancy test is required before starting this medication. A reliable form of contraception is recommended while taking this  medication and for 9 months after the last dose. Talk to your care team about effective forms of contraception. Do not father a child while taking this medication and for 6 months after the last dose. Use a condom while having sex during this time period. Do not breastfeed while taking this medication and for 3 months after the last dose. This medication may cause infertility. Talk to your care team if you are concerned about your fertility. What side effects may I notice from receiving this medication? Side effects that you should report to your care team as soon as possible: Allergic reactions--skin rash, itching, hives, swelling of the face, lips, tongue, or throat Bleeding--bloody or black, tar-like stools, vomiting blood or brown material that looks like coffee grounds, red or dark brown urine, small red or purple spots on skin, unusual bruising or bleeding Dry cough, shortness of breath or trouble breathing Heart rhythm changes--fast or irregular heartbeat, dizziness, feeling faint or lightheaded, chest pain, trouble breathing Infection--fever, chills, cough, sore throat, wounds that don't heal, pain or trouble when passing urine, general feeling of discomfort or being unwell  Liver injury--right upper belly pain, loss of appetite, nausea, light-colored stool, dark yellow or brown urine, yellowing skin or eyes, unusual weakness or fatigue Low red blood cell level--unusual weakness or fatigue, dizziness, headache, trouble breathing Muscle injury--unusual weakness or fatigue, muscle pain, dark yellow or brown urine, decrease in amount of urine Pain, tingling, or numbness in the hands or feet Sudden and severe headache, confusion, change in vision, seizures, which may be signs of posterior reversible encephalopathy syndrome (PRES) Unusual bruising or bleeding Side effects that usually do not require medical attention (report to your care team if they continue or are  bothersome): Diarrhea Nausea Pain, redness, or swelling with sores inside the mouth or throat Unusual weakness or fatigue Vomiting This list may not describe all possible side effects. Call your doctor for medical advice about side effects. You may report side effects to FDA at 1-800-FDA-1088. Where should I keep my medication? This medication is given in a hospital or clinic. It will not be stored at home. NOTE: This sheet is a summary. It may not cover all possible information. If you have questions about this medicine, talk to your doctor, pharmacist, or health care provider.  2024 Elsevier/Gold Standard (2023-07-12 00:00:00)

## 2024-05-06 NOTE — Progress Notes (Signed)
 Reduce dose of oxaliplatin  by 50% due to worsening neuropathy per Dr. Ezzard.

## 2024-05-07 ENCOUNTER — Encounter

## 2024-05-08 ENCOUNTER — Telehealth: Payer: Self-pay

## 2024-05-08 ENCOUNTER — Inpatient Hospital Stay

## 2024-05-08 VITALS — BP 162/77 | HR 81 | Temp 98.0°F | Resp 18 | Ht 69.0 in

## 2024-05-08 DIAGNOSIS — C20 Malignant neoplasm of rectum: Secondary | ICD-10-CM

## 2024-05-08 NOTE — Telephone Encounter (Signed)
 Patient notified of patient assist ready for pick up. Patient stated will come by Monday to pick up.

## 2024-05-08 NOTE — Patient Instructions (Signed)
 The chemotherapy medication bag should finish at 46 hours, 96 hours, or 7 days. For example, if your pump is scheduled for 46 hours and it was put on at 4:00 p.m., it should finish at 2:00 p.m. the day it is scheduled to come off regardless of your appointment time.     Estimated time to finish at 1351.   If the display on your pump reads Low Volume and it is beeping, take the batteries out of the pump and come to the cancer center for it to be taken off.   If the pump alarms go off prior to the pump reading Low Volume then call [phone] and someone can assist you.  If the plunger comes out and the chemotherapy medication is leaking out, please use your home chemo spill kit to clean up the spill. Do NOT use paper towels or other household products.  If you have problems or questions regarding your pump, please call either [phone] (24 hours a day) or the cancer center Monday-Friday 8:00 a.m.- 4:30 p.m. at the clinic number and we will assist you. If you are unable to get assistance, then go to the nearest Emergency Department and ask the staff to contact the IV team for assistance.

## 2024-05-12 ENCOUNTER — Other Ambulatory Visit: Payer: Self-pay

## 2024-05-12 DIAGNOSIS — R112 Nausea with vomiting, unspecified: Secondary | ICD-10-CM

## 2024-05-12 MED ORDER — ONDANSETRON HCL 8 MG PO TABS: 8.0000 mg | ORAL_TABLET | Freq: Three times a day (TID) | ORAL | 1 refills | Status: AC | PRN

## 2024-05-18 ENCOUNTER — Telehealth: Payer: Self-pay

## 2024-05-18 DIAGNOSIS — C20 Malignant neoplasm of rectum: Secondary | ICD-10-CM | POA: Diagnosis not present

## 2024-05-18 MED ORDER — DAPAGLIFLOZIN PROPANEDIOL 10 MG PO TABS: 10.0000 mg | ORAL_TABLET | Freq: Every day | ORAL | 0 refills | Status: DC

## 2024-05-18 NOTE — Progress Notes (Signed)
   05/18/2024  Patient ID: Joshua Nelson Fell, male   DOB: [birth year], 73 y.o.   MRN: [id number]  Pharmacy Quality Measure Review  This patient is appearing on a report for being at risk of failing the adherence measure for diabetes medications this calendar year.   Medication: farxiga  10mg  daily Last fill date: 03/26/2024 for 30 day supply  Discussed barriers to adherence, which included new refill not being sent to pharmacy. Will send refill order to PCP  Lang Sieve, PharmD, BCGP Clinical Pharmacist  [phone]

## 2024-05-18 NOTE — Progress Notes (Unsigned)
 [facility]  [address] [place],  [place]  [address] [phone]  Clinic Day:  05/19/2024  Referring physician: Sherre Clapper, MD   HISTORY OF PRESENT ILLNESS:  The patient is a 73 y.o. male with  metastatic rectal cancer, including spread of disease to his liver.  He comes in today to be evaluated before heading into his 12th cycle of FOLFOX/Avastin .  He states he tolerated his 11th cycle okay.  His peripheral neuropathy remains prominent.  He admits to dropping items more commonly than previously.  However, it has not gotten worse to where he wishes to discontinue his oxaliplatin  at this time.  He continues to deny having any GI issues which concern him for overt signs of disease progression.  VITALS:  Blood pressure 138/67, pulse 73, temperature 98.1 F (36.7 C), temperature source Oral, resp. rate 16, height 5' 9 (1.753 m), weight 164 lb 12.8 oz (74.8 kg), SpO2 99%. Wt Readings from Last 3 Encounters:  05/19/24 164 lb 12.8 oz (74.8 kg)  05/06/24 164 lb 8 oz (74.6 kg)  04/30/24 167 lb (75.8 kg)   Body mass index is 24.34 kg/m.  Performance status (ECOG): 1 - Symptomatic but completely ambulatory  PHYSICAL EXAM:   Physical Exam Vitals and nursing note reviewed.  Constitutional:      General: He is not in acute distress.    Appearance: Normal appearance. He is normal weight.  HENT:     Head: Normocephalic and atraumatic.     Mouth/Throat:     Mouth: Mucous membranes are moist.     Pharynx: Oropharynx is clear. No oropharyngeal exudate or posterior oropharyngeal erythema.  Eyes:     General: No scleral icterus.    Extraocular Movements: Extraocular movements intact.     Conjunctiva/sclera: Conjunctivae normal.     Pupils: Pupils are equal, round, and reactive to light.  Cardiovascular:     Rate and Rhythm: Normal rate and regular rhythm.     Heart sounds: Normal heart sounds. No murmur heard.    No friction rub. No gallop.  Pulmonary:      Effort: Pulmonary effort is normal.     Breath sounds: Normal breath sounds. No wheezing, rhonchi or rales.  Abdominal:     General: Bowel sounds are normal. There is no distension.     Palpations: Abdomen is soft. There is no hepatomegaly, splenomegaly or mass.     Tenderness: There is no abdominal tenderness.  Musculoskeletal:        General: Normal range of motion.     Cervical back: Normal range of motion and neck supple. No tenderness.     Right lower leg: No edema.     Left lower leg: No edema.  Lymphadenopathy:     Cervical: No cervical adenopathy.     Upper Body:     Right upper body: No supraclavicular or axillary adenopathy.     Left upper body: No supraclavicular or axillary adenopathy.     Lower Body: No right inguinal adenopathy. No left inguinal adenopathy.  Skin:    General: Skin is warm and dry.     Coloration: Skin is not jaundiced.     Findings: No rash.  Neurological:     Mental Status: He is alert and oriented to person, place, and time.     Cranial Nerves: No cranial nerve deficit.  Psychiatric:        Mood and Affect: Mood normal.        Behavior:  Behavior normal.        Thought Content: Thought content normal.    LABS:      Latest Ref Rng & Units 05/19/2024    8:49 AM 05/06/2024    8:47 AM 04/21/2024    8:24 AM  CBC  WBC 4.0 - 10.5 K/uL 4.6  4.7  4.5   Hemoglobin 13.0 - 17.0 g/dL 9.9  9.9  89.7   Hematocrit 39.0 - 52.0 % 30.3  30.5  32.0   Platelets 150 - 400 K/uL 84  101  89       Latest Ref Rng & Units 05/19/2024    8:49 AM 05/06/2024    8:47 AM 04/21/2024    8:24 AM  CMP  Glucose 70 - 99 mg/dL 797  875  829   BUN 8 - 23 mg/dL 13  14  13    Creatinine 0.61 - 1.24 mg/dL 8.97  8.90  9.20   Sodium 135 - 145 mmol/L 140  140  139   Potassium 3.5 - 5.1 mmol/L 4.1  4.4  4.2   Chloride 98 - 111 mmol/L 105  106  104   CO2 22 - 32 mmol/L 25  24  24    Calcium  8.9 - 10.3 mg/dL 9.3  9.2  9.3   Total Protein 6.5 - 8.1 g/dL 6.6  6.8  6.8   Total Bilirubin  0.0 - 1.2 mg/dL 0.6  0.7  0.7   Alkaline Phos 38 - 126 U/L 53  51  56   AST 15 - 41 U/L 21  20  22    ALT 0 - 44 U/L 10  9  13       Lab Results  Component Value Date   CEA 7.04 (H) 02/06/2024   CEA 13.98 (H) 10/28/2023    ASSESSMENT & PLAN:  Assessment/Plan:  A 73 y.o. male with metastatic rectal cancer including spread of his disease to his liver.  He will proceed with his 12th cycle of FOLFOX/Avastin  today.  His oxaliplatin  will remain decreased by 50%.  The patient understands there is a high likelihood that his oxaliplatin  will ultimately be discontinued altogether, particularly if his peripheral neuropathy progresses over time.  As his hemoglobin is less than 10 today, he will receive Retacrit  20,000 units.  The patient did express concerns about continuing his Ozempic .  I did talk to his primary care provider, who had no problems with his Ozempic  being discontinued, as long as he kept a close eye on his blood sugars.  The patient has no problem doing this, particularly as he has an on-body glucose reader to keep a constant check of his blood sugars.  Overall, he appears to be doing fairly well.  I will see him back in 2 weeks before he heads into his 13th cycle of FOLFOX/Avastin . The patient understands all the plans discussed today and is in agreement with them.  Keaghan Staton DELENA Kerns, MD

## 2024-05-18 NOTE — Progress Notes (Signed)
   05/18/2024  Patient ID: Joshua Nelson Fell, male   DOB: 1950/10/26, 73 y.o.   MRN: [id number]  Spoke with Nathanel regarding farxiga  being due for a refill. Wife confirmed patient is still taking - order sent to pharmacy  Lang Sieve, PharmD, BCGP Clinical Pharmacist  [phone]

## 2024-05-19 ENCOUNTER — Inpatient Hospital Stay

## 2024-05-19 ENCOUNTER — Encounter: Payer: Self-pay | Admitting: Oncology

## 2024-05-19 ENCOUNTER — Inpatient Hospital Stay (HOSPITAL_BASED_OUTPATIENT_CLINIC_OR_DEPARTMENT_OTHER): Admitting: Oncology

## 2024-05-19 ENCOUNTER — Inpatient Hospital Stay: Attending: Oncology

## 2024-05-19 VITALS — BP 138/67 | HR 73 | Temp 98.1°F | Resp 16 | Ht 69.0 in | Wt 164.8 lb

## 2024-05-19 DIAGNOSIS — Z452 Encounter for adjustment and management of vascular access device: Secondary | ICD-10-CM

## 2024-05-19 DIAGNOSIS — K769 Liver disease, unspecified: Secondary | ICD-10-CM | POA: Diagnosis not present

## 2024-05-19 DIAGNOSIS — C787 Secondary malignant neoplasm of liver and intrahepatic bile duct: Secondary | ICD-10-CM | POA: Diagnosis not present

## 2024-05-19 DIAGNOSIS — Z79899 Other long term (current) drug therapy: Secondary | ICD-10-CM | POA: Diagnosis not present

## 2024-05-19 DIAGNOSIS — C20 Malignant neoplasm of rectum: Secondary | ICD-10-CM

## 2024-05-19 DIAGNOSIS — G629 Polyneuropathy, unspecified: Secondary | ICD-10-CM | POA: Diagnosis not present

## 2024-05-19 DIAGNOSIS — Z5111 Encounter for antineoplastic chemotherapy: Secondary | ICD-10-CM | POA: Insufficient documentation

## 2024-05-19 LAB — CBC WITH DIFFERENTIAL (CANCER CENTER ONLY)
Abs Immature Granulocytes: 0.02 K/uL (ref 0.00–0.07)
Basophils Absolute: 0 K/uL (ref 0.0–0.1)
Basophils Relative: 0 %
Eosinophils Absolute: 0.1 K/uL (ref 0.0–0.5)
Eosinophils Relative: 2 %
HCT: 30.3 % — ABNORMAL LOW (ref 39.0–52.0)
Hemoglobin: 9.9 g/dL — ABNORMAL LOW (ref 13.0–17.0)
Immature Granulocytes: 0 %
Lymphocytes Relative: 19 %
Lymphs Abs: 0.9 K/uL (ref 0.7–4.0)
MCH: 31.6 pg (ref 26.0–34.0)
MCHC: 32.7 g/dL (ref 30.0–36.0)
MCV: 96.8 fL (ref 80.0–100.0)
Monocytes Absolute: 0.4 K/uL (ref 0.1–1.0)
Monocytes Relative: 8 %
Neutro Abs: 3.2 K/uL (ref 1.7–7.7)
Neutrophils Relative %: 71 %
Platelet Count: 84 K/uL — ABNORMAL LOW (ref 150–400)
RBC: 3.13 MIL/uL — ABNORMAL LOW (ref 4.22–5.81)
RDW: 16.9 % — ABNORMAL HIGH (ref 11.5–15.5)
WBC Count: 4.6 K/uL (ref 4.0–10.5)
nRBC: 0 % (ref 0.0–0.2)

## 2024-05-19 LAB — CMP (CANCER CENTER ONLY)
ALT: 10 U/L (ref 0–44)
AST: 21 U/L (ref 15–41)
Albumin: 3.9 g/dL (ref 3.5–5.0)
Alkaline Phosphatase: 53 U/L (ref 38–126)
Anion gap: 10 (ref 5–15)
BUN: 13 mg/dL (ref 8–23)
CO2: 25 mmol/L (ref 22–32)
Calcium: 9.3 mg/dL (ref 8.9–10.3)
Chloride: 105 mmol/L (ref 98–111)
Creatinine: 1.02 mg/dL (ref 0.61–1.24)
GFR, Estimated: >60 mL/min (ref >60)
Glucose, Bld: 202 mg/dL — ABNORMAL HIGH (ref 70–99)
Potassium: 4.1 mmol/L (ref 3.5–5.1)
Sodium: 140 mmol/L (ref 135–145)
Total Bilirubin: 0.6 mg/dL (ref 0.0–1.2)
Total Protein: 6.6 g/dL (ref 6.5–8.1)

## 2024-05-19 LAB — TOTAL PROTEIN, URINE DIPSTICK: Protein, ur: NEGATIVE mg/dL

## 2024-05-19 MED ORDER — OXALIPLATIN CHEMO INJECTION 100 MG/20ML
35.0000 mg/m2 | Freq: Once | INTRAVENOUS | Status: AC
  Administered 2024-05-19: 70 mg via INTRAVENOUS
  Filled 2024-05-19: qty 14

## 2024-05-19 MED ORDER — DEXTROSE 5 % IV SOLN: INTRAVENOUS | Status: DC

## 2024-05-19 MED ORDER — LEUCOVORIN CALCIUM INJECTION 350 MG
320.0000 mg/m2 | Freq: Once | INTRAVENOUS | Status: AC
  Administered 2024-05-19: 652 mg via INTRAVENOUS
  Filled 2024-05-19: qty 32.6

## 2024-05-19 MED ORDER — DIPHENHYDRAMINE HCL 50 MG/ML IJ SOLN
25.0000 mg | Freq: Once | INTRAMUSCULAR | Status: AC
  Administered 2024-05-19: 25 mg via INTRAVENOUS
  Filled 2024-05-19: qty 1

## 2024-05-19 MED ORDER — FAMOTIDINE IN NACL 20-0.9 MG/50ML-% IV SOLN
20.0000 mg | Freq: Once | INTRAVENOUS | Status: AC
  Administered 2024-05-19: 20 mg via INTRAVENOUS
  Filled 2024-05-19: qty 50

## 2024-05-19 MED ORDER — DEXAMETHASONE SODIUM PHOSPHATE 10 MG/ML IJ SOLN
10.0000 mg | Freq: Once | INTRAMUSCULAR | Status: AC
  Administered 2024-05-19: 10 mg via INTRAVENOUS
  Filled 2024-05-19: qty 1

## 2024-05-19 MED ORDER — EPOETIN ALFA-EPBX 20000 UNIT/ML IJ SOLN
20000.0000 [IU] | Freq: Once | INTRAMUSCULAR | Status: AC
  Administered 2024-05-19: 20000 [IU] via SUBCUTANEOUS
  Filled 2024-05-19: qty 1

## 2024-05-19 MED ORDER — SODIUM CHLORIDE 0.9 % IV SOLN
5.0000 mg/kg | Freq: Once | INTRAVENOUS | Status: AC
  Administered 2024-05-19: 400 mg via INTRAVENOUS
  Filled 2024-05-19: qty 16

## 2024-05-19 MED ORDER — SODIUM CHLORIDE 0.9 % IV SOLN
150.0000 mg | Freq: Once | INTRAVENOUS | Status: AC
  Administered 2024-05-19: 150 mg via INTRAVENOUS
  Filled 2024-05-19: qty 150

## 2024-05-19 MED ORDER — SODIUM CHLORIDE 0.9% FLUSH: 10.0000 mL | INTRAVENOUS | Status: DC | PRN

## 2024-05-19 MED ORDER — FLUOROURACIL CHEMO INJECTION 2.5 GM/50ML
320.0000 mg/m2 | Freq: Once | INTRAVENOUS | Status: AC
  Administered 2024-05-19: 650 mg via INTRAVENOUS
  Filled 2024-05-19: qty 13

## 2024-05-19 MED ORDER — PALONOSETRON HCL INJECTION 0.25 MG/5ML
0.2500 mg | Freq: Once | INTRAVENOUS | Status: AC
  Administered 2024-05-19: 0.25 mg via INTRAVENOUS
  Filled 2024-05-19: qty 5

## 2024-05-19 MED ORDER — SODIUM CHLORIDE 0.9 % IV SOLN: INTRAVENOUS | Status: DC

## 2024-05-19 MED ORDER — SODIUM CHLORIDE 0.9 % IV SOLN
1920.0000 mg/m2 | INTRAVENOUS | Status: DC
  Administered 2024-05-19: 3900 mg via INTRAVENOUS
  Filled 2024-05-19: qty 78

## 2024-05-19 NOTE — Progress Notes (Signed)
 Per Dr Ezzard: Joshua Nelson to treat with platelets of 84.  1123- Blood sugar by patient's continuous monitor is 57- Snack provided. 1125- blood sugar 66 by patient's cutaneous monitor. Patient is warm and dry, oriented to person place time and situation.  1145- blood sugar 84 1200- Blood sugar 100.  1314. Patient instructed to stop Ozempic  per Dr. Ezzard instructions. Patient states understanding

## 2024-05-21 ENCOUNTER — Inpatient Hospital Stay

## 2024-05-21 VITALS — BP 151/74 | HR 77 | Temp 98.2°F | Resp 18

## 2024-05-21 DIAGNOSIS — C20 Malignant neoplasm of rectum: Secondary | ICD-10-CM

## 2024-05-21 NOTE — Patient Instructions (Signed)
 Fluorouracil Injection What is this medication? FLUOROURACIL (flure oh YOOR a sil) treats some types of cancer. It works by slowing down the growth of cancer cells. This medicine may be used for other purposes; ask your health care provider or pharmacist if you have questions. COMMON BRAND NAME(S): Adrucil What should I tell my care team before I take this medication? They need to know if you have any of these conditions: Blood disorders Dihydropyrimidine dehydrogenase (DPD) deficiency Infection, such as chickenpox, cold sores, herpes Kidney disease Liver disease Poor nutrition Recent or ongoing radiation therapy An unusual or allergic reaction to fluorouracil, other medications, foods, dyes, or preservatives If you or your partner are pregnant or trying to get pregnant Breast-feeding How should I use this medication? This medication is injected into a vein. It is administered by your care team in a hospital or clinic setting. Talk to your care team about the use of this medication in children. Special care may be needed. Overdosage: If you think you have taken too much of this medicine contact a poison control center or emergency room at once. NOTE: This medicine is only for you. Do not share this medicine with others. What if I miss a dose? Keep appointments for follow-up doses. It is important not to miss your dose. Call your care team if you are unable to keep an appointment. What may interact with this medication? Do not take this medication with any of the following: Live virus vaccines This medication may also interact with the following: Medications that treat or prevent blood clots, such as warfarin, enoxaparin, dalteparin This list may not describe all possible interactions. Give your health care provider a list of all the medicines, herbs, non-prescription drugs, or dietary supplements you use. Also tell them if you smoke, drink alcohol, or use illegal drugs. Some items may  interact with your medicine. What should I watch for while using this medication? Your condition will be monitored carefully while you are receiving this medication. This medication may make you feel generally unwell. This is not uncommon as chemotherapy can affect healthy cells as well as cancer cells. Report any side effects. Continue your course of treatment even though you feel ill unless your care team tells you to stop. In some cases, you may be given additional medications to help with side effects. Follow all directions for their use. This medication may increase your risk of getting an infection. Call your care team for advice if you get a fever, chills, sore throat, or other symptoms of a cold or flu. Do not treat yourself. Try to avoid being around people who are sick. This medication may increase your risk to bruise or bleed. Call your care team if you notice any unusual bleeding. Be careful brushing or flossing your teeth or using a toothpick because you may get an infection or bleed more easily. If you have any dental work done, tell your dentist you are receiving this medication. Avoid taking medications that contain aspirin, acetaminophen, ibuprofen, naproxen, or ketoprofen unless instructed by your care team. These medications may hide a fever. Do not treat diarrhea with over the counter products. Contact your care team if you have diarrhea that lasts more than 2 days or if it is severe and watery. This medication can make you more sensitive to the sun. Keep out of the sun. If you cannot avoid being in the sun, wear protective clothing and sunscreen. Do not use sun lamps, tanning beds, or tanning booths. Talk to  your care team if you or your partner wish to become pregnant or think you might be pregnant. This medication can cause serious birth defects if taken during pregnancy and for 3 months after the last dose. A reliable form of contraception is recommended while taking this  medication and for 3 months after the last dose. Talk to your care team about effective forms of contraception. Do not father a child while taking this medication and for 3 months after the last dose. Use a condom while having sex during this time period. Do not breastfeed while taking this medication. This medication may cause infertility. Talk to your care team if you are concerned about your fertility. What side effects may I notice from receiving this medication? Side effects that you should report to your care team as soon as possible: Allergic reactions--skin rash, itching, hives, swelling of the face, lips, tongue, or throat Heart attack--pain or tightness in the chest, shoulders, arms, or jaw, nausea, shortness of breath, cold or clammy skin, feeling faint or lightheaded Heart failure--shortness of breath, swelling of the ankles, feet, or hands, sudden weight gain, unusual weakness or fatigue Heart rhythm changes--fast or irregular heartbeat, dizziness, feeling faint or lightheaded, chest pain, trouble breathing High ammonia level--unusual weakness or fatigue, confusion, loss of appetite, nausea, vomiting, seizures Infection--fever, chills, cough, sore throat, wounds that don't heal, pain or trouble when passing urine, general feeling of discomfort or being unwell Low red blood cell level--unusual weakness or fatigue, dizziness, headache, trouble breathing Pain, tingling, or numbness in the hands or feet, muscle weakness, change in vision, confusion or trouble speaking, loss of balance or coordination, trouble walking, seizures Redness, swelling, and blistering of the skin over hands and feet Severe or prolonged diarrhea Unusual bruising or bleeding Side effects that usually do not require medical attention (report to your care team if they continue or are bothersome): Dry skin Headache Increased tears Nausea Pain, redness, or swelling with sores inside the mouth or throat Sensitivity  to light Vomiting This list may not describe all possible side effects. Call your doctor for medical advice about side effects. You may report side effects to FDA at 1-800-FDA-1088. Where should I keep my medication? This medication is given in a hospital or clinic. It will not be stored at home. NOTE: This sheet is a summary. It may not cover all possible information. If you have questions about this medicine, talk to your doctor, pharmacist, or health care provider.  2024 Elsevier/Gold Standard (2021-12-05 00:00:00)

## 2024-05-23 ENCOUNTER — Other Ambulatory Visit: Payer: Self-pay | Admitting: Family Medicine

## 2024-05-23 DIAGNOSIS — E1142 Type 2 diabetes mellitus with diabetic polyneuropathy: Secondary | ICD-10-CM

## 2024-06-01 NOTE — Progress Notes (Unsigned)
 [facility]  [address] [place],  [place]  [address] [phone]  Clinic Day:  06/02/2024  Referring physician: Sherre Clapper, MD   HISTORY OF PRESENT ILLNESS:  The patient is a 73 y.o. male with  metastatic rectal cancer, including spread of disease to his liver.  He comes in today to be evaluated before heading into his 13th cycle of FOLFOX/Avastin .  He states he tolerated his 12th cycle okay.  His peripheral neuropathy remains an issue, but has not progressed over time.  His oxaliplatin  dose has been decreased by 50%.  Of note, this patient's Ozempic  was discontinued 2 weeks ago.  Since this occurred, the patient believes he has had less nausea.  He is also believes that he is gaining weight over these past few weeks.  He continues to deny having any GI issues which concern him for overt signs of disease progression.  VITALS:  Blood pressure (!) 159/70, pulse 71, temperature 97.8 F (36.6 C), temperature source Oral, resp. rate 16, height 5' 9 (1.753 m), weight 171 lb 14.4 oz (78 kg), SpO2 100%. Wt Readings from Last 3 Encounters:  06/02/24 171 lb 14.4 oz (78 kg)  05/19/24 164 lb 12.8 oz (74.8 kg)  05/06/24 164 lb 8 oz (74.6 kg)   Body mass index is 25.39 kg/m.  Performance status (ECOG): 1 - Symptomatic but completely ambulatory  PHYSICAL EXAM:   Physical Exam Vitals and nursing note reviewed.  Constitutional:      General: He is not in acute distress.    Appearance: Normal appearance. He is normal weight.  HENT:     Head: Normocephalic and atraumatic.     Mouth/Throat:     Mouth: Mucous membranes are moist.     Pharynx: Oropharynx is clear. No oropharyngeal exudate or posterior oropharyngeal erythema.  Eyes:     General: No scleral icterus.    Extraocular Movements: Extraocular movements intact.     Conjunctiva/sclera: Conjunctivae normal.     Pupils: Pupils are equal, round, and reactive to light.  Cardiovascular:     Rate and Rhythm: Normal  rate and regular rhythm.     Heart sounds: Normal heart sounds. No murmur heard.    No friction rub. No gallop.  Pulmonary:     Effort: Pulmonary effort is normal.     Breath sounds: Normal breath sounds. No wheezing, rhonchi or rales.  Abdominal:     General: Bowel sounds are normal. There is no distension.     Palpations: Abdomen is soft. There is no hepatomegaly, splenomegaly or mass.     Tenderness: There is no abdominal tenderness.  Musculoskeletal:        General: Normal range of motion.     Cervical back: Normal range of motion and neck supple. No tenderness.     Right lower leg: No edema.     Left lower leg: No edema.  Lymphadenopathy:     Cervical: No cervical adenopathy.     Upper Body:     Right upper body: No supraclavicular or axillary adenopathy.     Left upper body: No supraclavicular or axillary adenopathy.     Lower Body: No right inguinal adenopathy. No left inguinal adenopathy.  Skin:    General: Skin is warm and dry.     Coloration: Skin is not jaundiced.     Findings: No rash.  Neurological:     Mental Status: He is alert and oriented to person, place, and time.  Cranial Nerves: No cranial nerve deficit.  Psychiatric:        Mood and Affect: Mood normal.        Behavior: Behavior normal.        Thought Content: Thought content normal.    LABS:      Latest Ref Rng & Units 06/02/2024    9:04 AM 05/19/2024    8:49 AM 05/06/2024    8:47 AM  CBC  WBC 4.0 - 10.5 K/uL 4.3  4.6  4.7   Hemoglobin 13.0 - 17.0 g/dL 9.7  9.9  9.9   Hematocrit 39.0 - 52.0 % 30.4  30.3  30.5   Platelets 150 - 400 K/uL 83  84  101       Latest Ref Rng & Units 06/02/2024    9:04 AM 05/19/2024    8:49 AM 05/06/2024    8:47 AM  CMP  Glucose 70 - 99 mg/dL 893  797  875   BUN 8 - 23 mg/dL 13  13  14    Creatinine 0.61 - 1.24 mg/dL 9.27  8.97  8.90   Sodium 135 - 145 mmol/L 139  140  140   Potassium 3.5 - 5.1 mmol/L 4.2  4.1  4.4   Chloride 98 - 111 mmol/L 106  105  106   CO2  22 - 32 mmol/L 24  25  24    Calcium  8.9 - 10.3 mg/dL 9.3  9.3  9.2   Total Protein 6.5 - 8.1 g/dL 6.6  6.6  6.8   Total Bilirubin 0.0 - 1.2 mg/dL 0.5  0.6  0.7   Alkaline Phos 38 - 126 U/L 51  53  51   AST 15 - 41 U/L 25  21  20    ALT 0 - 44 U/L 14  10  9       Lab Results  Component Value Date   CEA 7.04 (H) 02/06/2024   CEA 13.98 (H) 10/28/2023    ASSESSMENT & PLAN:  Assessment/Plan:  A 73 y.o. male with metastatic rectal cancer including spread of his disease to his liver.  He will proceed with his 13th cycle of FOLFOX/Avastin  today.  His oxaliplatin  will remain decreased by 50%.  The patient understands there is a high likelihood that his oxaliplatin  will ultimately be discontinued altogether, particularly if his peripheral neuropathy progresses over time.  As his hemoglobin is less than 10 today, he will receive Retacrit  20,000 units.  Overall, he appears to be doing fairly well.  I will see him back in 2 weeks before he heads into his 14th cycle of FOLFOX/Avastin . The patient understands all the plans discussed today and is in agreement with them.  Sonam Huelsmann DELENA Kerns, MD

## 2024-06-02 ENCOUNTER — Telehealth: Payer: Self-pay | Admitting: Oncology

## 2024-06-02 ENCOUNTER — Inpatient Hospital Stay: Admitting: Oncology

## 2024-06-02 ENCOUNTER — Inpatient Hospital Stay

## 2024-06-02 ENCOUNTER — Encounter: Payer: Self-pay | Admitting: Oncology

## 2024-06-02 VITALS — BP 159/70 | HR 71 | Temp 97.8°F | Resp 16 | Ht 69.0 in | Wt 171.9 lb

## 2024-06-02 DIAGNOSIS — C20 Malignant neoplasm of rectum: Secondary | ICD-10-CM

## 2024-06-02 DIAGNOSIS — C787 Secondary malignant neoplasm of liver and intrahepatic bile duct: Secondary | ICD-10-CM | POA: Diagnosis not present

## 2024-06-02 DIAGNOSIS — Z5111 Encounter for antineoplastic chemotherapy: Secondary | ICD-10-CM | POA: Diagnosis not present

## 2024-06-02 DIAGNOSIS — Z452 Encounter for adjustment and management of vascular access device: Secondary | ICD-10-CM

## 2024-06-02 LAB — CBC WITH DIFFERENTIAL (CANCER CENTER ONLY)
Abs Immature Granulocytes: 0.02 K/uL (ref 0.00–0.07)
Basophils Absolute: 0 K/uL (ref 0.0–0.1)
Basophils Relative: 0 %
Eosinophils Absolute: 0.2 K/uL (ref 0.0–0.5)
Eosinophils Relative: 4 %
HCT: 30.4 % — ABNORMAL LOW (ref 39.0–52.0)
Hemoglobin: 9.7 g/dL — ABNORMAL LOW (ref 13.0–17.0)
Immature Granulocytes: 1 %
Lymphocytes Relative: 18 %
Lymphs Abs: 0.8 K/uL (ref 0.7–4.0)
MCH: 31.4 pg (ref 26.0–34.0)
MCHC: 31.9 g/dL (ref 30.0–36.0)
MCV: 98.4 fL (ref 80.0–100.0)
Monocytes Absolute: 0.4 K/uL (ref 0.1–1.0)
Monocytes Relative: 10 %
Neutro Abs: 2.9 K/uL (ref 1.7–7.7)
Neutrophils Relative %: 67 %
Platelet Count: 83 K/uL — ABNORMAL LOW (ref 150–400)
RBC: 3.09 MIL/uL — ABNORMAL LOW (ref 4.22–5.81)
RDW: 16.2 % — ABNORMAL HIGH (ref 11.5–15.5)
WBC Count: 4.3 K/uL (ref 4.0–10.5)
nRBC: 0 % (ref 0.0–0.2)

## 2024-06-02 LAB — CMP (CANCER CENTER ONLY)
ALT: 14 U/L (ref 0–44)
AST: 25 U/L (ref 15–41)
Albumin: 4 g/dL (ref 3.5–5.0)
Alkaline Phosphatase: 51 U/L (ref 38–126)
Anion gap: 9 (ref 5–15)
BUN: 13 mg/dL (ref 8–23)
CO2: 24 mmol/L (ref 22–32)
Calcium: 9.3 mg/dL (ref 8.9–10.3)
Chloride: 106 mmol/L (ref 98–111)
Creatinine: 0.72 mg/dL (ref 0.61–1.24)
GFR, Estimated: >60 mL/min (ref >60)
Glucose, Bld: 106 mg/dL — ABNORMAL HIGH (ref 70–99)
Potassium: 4.2 mmol/L (ref 3.5–5.1)
Sodium: 139 mmol/L (ref 135–145)
Total Bilirubin: 0.5 mg/dL (ref 0.0–1.2)
Total Protein: 6.6 g/dL (ref 6.5–8.1)

## 2024-06-02 MED ORDER — SODIUM CHLORIDE 0.9 % IV SOLN
5.0000 mg/kg | Freq: Once | INTRAVENOUS | Status: AC
  Administered 2024-06-02: 400 mg via INTRAVENOUS
  Filled 2024-06-02: qty 16

## 2024-06-02 MED ORDER — FAMOTIDINE IN NACL 20-0.9 MG/50ML-% IV SOLN
20.0000 mg | Freq: Once | INTRAVENOUS | Status: AC
  Administered 2024-06-02: 20 mg via INTRAVENOUS
  Filled 2024-06-02: qty 50

## 2024-06-02 MED ORDER — DEXTROSE 5 % IV SOLN: INTRAVENOUS | Status: DC

## 2024-06-02 MED ORDER — DIPHENHYDRAMINE HCL 50 MG/ML IJ SOLN
25.0000 mg | Freq: Once | INTRAMUSCULAR | Status: AC
  Administered 2024-06-02: 25 mg via INTRAVENOUS
  Filled 2024-06-02: qty 1

## 2024-06-02 MED ORDER — PALONOSETRON HCL INJECTION 0.25 MG/5ML
0.2500 mg | Freq: Once | INTRAVENOUS | Status: AC
  Administered 2024-06-02: 0.25 mg via INTRAVENOUS
  Filled 2024-06-02: qty 5

## 2024-06-02 MED ORDER — EPOETIN ALFA-EPBX 20000 UNIT/ML IJ SOLN
20000.0000 [IU] | Freq: Once | INTRAMUSCULAR | Status: AC
  Administered 2024-06-02: 20000 [IU] via SUBCUTANEOUS
  Filled 2024-06-02: qty 1

## 2024-06-02 MED ORDER — FLUOROURACIL CHEMO INJECTION 2.5 GM/50ML
320.0000 mg/m2 | Freq: Once | INTRAVENOUS | Status: AC
  Administered 2024-06-02: 650 mg via INTRAVENOUS
  Filled 2024-06-02: qty 13

## 2024-06-02 MED ORDER — SODIUM CHLORIDE 0.9 % IV SOLN: INTRAVENOUS | Status: DC

## 2024-06-02 MED ORDER — SODIUM CHLORIDE 0.9 % IV SOLN
150.0000 mg | Freq: Once | INTRAVENOUS | Status: AC
  Administered 2024-06-02: 150 mg via INTRAVENOUS
  Filled 2024-06-02: qty 150

## 2024-06-02 MED ORDER — SODIUM CHLORIDE 0.9 % IV SOLN
1920.0000 mg/m2 | INTRAVENOUS | Status: DC
  Administered 2024-06-02: 3900 mg via INTRAVENOUS
  Filled 2024-06-02: qty 78

## 2024-06-02 MED ORDER — LEUCOVORIN CALCIUM INJECTION 350 MG
320.0000 mg/m2 | Freq: Once | INTRAVENOUS | Status: AC
  Administered 2024-06-02: 652 mg via INTRAVENOUS
  Filled 2024-06-02: qty 32.6

## 2024-06-02 MED ORDER — OXALIPLATIN CHEMO INJECTION 100 MG/20ML
35.0000 mg/m2 | Freq: Once | INTRAVENOUS | Status: AC
  Administered 2024-06-02: 70 mg via INTRAVENOUS
  Filled 2024-06-02: qty 14

## 2024-06-02 MED ORDER — DEXAMETHASONE SOD PHOSPHATE PF 10 MG/ML IJ SOLN
10.0000 mg | Freq: Once | INTRAMUSCULAR | Status: AC
  Administered 2024-06-02: 10 mg via INTRAVENOUS

## 2024-06-02 NOTE — Patient Instructions (Signed)
 Epoetin  Alfa Injection What is this medication? EPOETIN  ALFA (e POE e tin AL fa) treats low levels of red blood cells (anemia) caused by kidney disease, chemotherapy, or HIV medications. It can also be used in people who are at risk for blood loss during surgery. It works by Systems analyst make more red blood cells, which reduces the need for blood transfusions. This medicine may be used for other purposes; ask your health care provider or pharmacist if you have questions. COMMON BRAND NAME(S): Epogen , Procrit, Retacrit  What should I tell my care team before I take this medication? They need to know if you have any of these conditions: Blood clots Cancer Heart disease High blood pressure On dialysis Seizures Stroke An unusual or allergic reaction to epoetin  alfa, albumin, benzyl alcohol, other medications, foods, dyes, or preservatives Pregnant or trying to get pregnant Breast-feeding How should I use this medication? This medication is injected into a vein or under the skin. It is usually given by your care team in a hospital or clinic setting. It may also be given at home. If you get this medication at home, you will be taught how to prepare and give it. Use exactly as directed. Take it as directed on the prescription label at the same time every day. Keep taking it unless your care team tells you to stop. It is important that you put your used needles and syringes in a special sharps container. Do not put them in a trash can. If you do not have a sharps container, call your pharmacist or care team to get one. A special MedGuide will be given to you by the pharmacist with each prescription and refill. Be sure to read this information carefully each time. Talk to your care team about the use of this medication in children. While this medication may be used in children as young as 1 month of age for selected conditions, precautions do apply. Overdosage: If you think you have taken too much  of this medicine contact a poison control center or emergency room at once. NOTE: This medicine is only for you. Do not share this medicine with others. What if I miss a dose? If you miss a dose, take it as soon as you can. If it is almost time for your next dose, take only that dose. Do not take double or extra doses. What may interact with this medication? Darbepoetin alfa Methoxy polyethylene glycol-epoetin  beta This list may not describe all possible interactions. Give your health care provider a list of all the medicines, herbs, non-prescription drugs, or dietary supplements you use. Also tell them if you smoke, drink alcohol, or use illegal drugs. Some items may interact with your medicine. What should I watch for while using this medication? Visit your care team for regular checks on your progress. Check your blood pressure as directed. Know what your blood pressure should be and when to contact your care team. Your condition will be monitored carefully while you are receiving this medication. You may need blood work while taking this medication. What side effects may I notice from receiving this medication? Side effects that you should report to your care team as soon as possible: Allergic reactions--skin rash, itching, hives, swelling of the face, lips, tongue, or throat Blood clot--pain, swelling, or warmth in the leg, shortness of breath, chest pain Heart attack--pain or tightness in the chest, shoulders, arms, or jaw, nausea, shortness of breath, cold or clammy skin, feeling faint or lightheaded Increase  in blood pressure Rash, fever, and swollen lymph nodes Redness, blistering, peeling, or loosening of the skin, including inside the mouth Seizures Stroke--sudden numbness or weakness of the face, arm, or leg, trouble speaking, confusion, trouble walking, loss of balance or coordination, dizziness, severe headache, change in vision Side effects that usually do not require medical  attention (report to your care team if they continue or are bothersome): Bone, joint, or muscle pain Cough Headache Nausea Pain, redness, or irritation at injection site This list may not describe all possible side effects. Call your doctor for medical advice about side effects. You may report side effects to FDA at 1-800-FDA-1088. Where should I keep my medication? Keep out of the reach of children and pets. Store in a refrigerator. Do not freeze. Do not shake. Protect from light. Keep this medication in the original container until you are ready to take it. See product for storage information. Get rid of any unused medication after the expiration date. To get rid of medications that are no longer needed or have expired: Take the medication to a medication take-back program. Check with your pharmacy or law enforcement to find a location. If you cannot return the medication, ask your pharmacist or care team how to get rid of the medication safely. NOTE: This sheet is a summary. It may not cover all possible information. If you have questions about this medicine, talk to your doctor, pharmacist, or health care provider.  2024 Elsevier/Gold Standard (2021-12-01 00:00:00)Bevacizumab  Injection What is this medication? BEVACIZUMAB  (be va SIZ yoo mab) treats some types of cancer. It works by blocking a protein that causes cancer cells to grow and multiply. This helps to slow or stop the spread of cancer cells. It is a monoclonal antibody. This medicine may be used for other purposes; ask your health care provider or pharmacist if you have questions. COMMON BRAND NAME(S): Alymsys , Avastin , MVASI , Vegzalma, Zirabev  What should I tell my care team before I take this medication? They need to know if you have any of these conditions: Blood clots Coughing up blood Having or recent surgery Heart failure High blood pressure History of a connection between 2 or more body parts that do not usually connect  (fistula) History of a tear in your stomach or intestines Protein in your urine An unusual or allergic reaction to bevacizumab , other medications, foods, dyes, or preservatives Pregnant or trying to get pregnant Breast-feeding How should I use this medication? This medication is injected into a vein. It is given by your care team in a hospital or clinic setting. Talk to your care team the use of this medication in children. Special care may be needed. Overdosage: If you think you have taken too much of this medicine contact a poison control center or emergency room at once. NOTE: This medicine is only for you. Do not share this medicine with others. What if I miss a dose? Keep appointments for follow-up doses. It is important not to miss your dose. Call your care team if you are unable to keep an appointment. What may interact with this medication? Interactions are not expected. This list may not describe all possible interactions. Give your health care provider a list of all the medicines, herbs, non-prescription drugs, or dietary supplements you use. Also tell them if you smoke, drink alcohol, or use illegal drugs. Some items may interact with your medicine. What should I watch for while using this medication? Your condition will be monitored carefully while you are receiving  this medication. You may need blood work while taking this medication. This medication may make you feel generally unwell. This is not uncommon as chemotherapy can affect healthy cells as well as cancer cells. Report any side effects. Continue your course of treatment even though you feel ill unless your care team tells you to stop. This medication may increase your risk to bruise or bleed. Call your care team if you notice any unusual bleeding. Before having surgery, talk to your care team to make sure it is ok. This medication can increase the risk of poor healing of your surgical site or wound. You will need to stop  this medication for 28 days before surgery. After surgery, wait at least 28 days before restarting this medication. Make sure the surgical site or wound is healed enough before restarting this medication. Talk to your care team if questions. Talk to your care team if you may be pregnant. Serious birth defects can occur if you take this medication during pregnancy and for 6 months after the last dose. Contraception is recommended while taking this medication and for 6 months after the last dose. Your care team can help you find the option that works for you. Do not breastfeed while taking this medication and for 6 months after the last dose. This medication can cause infertility. Talk to your care team if you are concerned about your fertility. What side effects may I notice from receiving this medication? Side effects that you should report to your care team as soon as possible: Allergic reactions--skin rash, itching, hives, swelling of the face, lips, tongue, or throat Bleeding--bloody or black, tar-like stools, vomiting blood or brown material that looks like coffee grounds, red or dark brown urine, small red or purple spots on skin, unusual bruising or bleeding Blood clot--pain, swelling, or warmth in the leg, shortness of breath, chest pain Heart attack--pain or tightness in the chest, shoulders, arms, or jaw, nausea, shortness of breath, cold or clammy skin, feeling faint or lightheaded Heart failure--shortness of breath, swelling of the ankles, feet, or hands, sudden weight gain, unusual weakness or fatigue Increase in blood pressure Infection--fever, chills, cough, sore throat, wounds that don't heal, pain or trouble when passing urine, general feeling of discomfort or being unwell Infusion reactions--chest pain, shortness of breath or trouble breathing, feeling faint or lightheaded Kidney injury--decrease in the amount of urine, swelling of the ankles, hands, or feet Stomach pain that is  severe, does not go away, or gets worse Stroke--sudden numbness or weakness of the face, arm, or leg, trouble speaking, confusion, trouble walking, loss of balance or coordination, dizziness, severe headache, change in vision Sudden and severe headache, confusion, change in vision, seizures, which may be signs of posterior reversible encephalopathy syndrome (PRES) Side effects that usually do not require medical attention (report to your care team if they continue or are bothersome): Back pain Change in taste Diarrhea Dry skin Increased tears Nosebleed This list may not describe all possible side effects. Call your doctor for medical advice about side effects. You may report side effects to FDA at 1-800-FDA-1088. Where should I keep my medication? This medication is given in a hospital or clinic. It will not be stored at home. NOTE: This sheet is a summary. It may not cover all possible information. If you have questions about this medicine, talk to your doctor, pharmacist, or health care provider.  2024 Elsevier/Gold Standard (2021-12-15 00:00:00)Oxaliplatin  Injection What is this medication? OXALIPLATIN  (ox AL i PLA tin) treats  colorectal cancer. It works by slowing down the growth of cancer cells. This medicine may be used for other purposes; ask your health care provider or pharmacist if you have questions. COMMON BRAND NAME(S): Eloxatin  What should I tell my care team before I take this medication? They need to know if you have any of these conditions: Heart disease History of irregular heartbeat or rhythm Liver disease Low blood cell levels (white cells, red cells, and platelets) Lung or breathing disease, such as asthma Take medications that treat or prevent blood clots Tingling of the fingers, toes, or other nerve disorder An unusual or allergic reaction to oxaliplatin , other medications, foods, dyes, or preservatives If you or your partner are pregnant or trying to get  pregnant Breast-feeding How should I use this medication? This medication is injected into a vein. It is given by your care team in a hospital or clinic setting. Talk to your care team about the use of this medication in children. Special care may be needed. Overdosage: If you think you have taken too much of this medicine contact a poison control center or emergency room at once. NOTE: This medicine is only for you. Do not share this medicine with others. What if I miss a dose? Keep appointments for follow-up doses. It is important not to miss a dose. Call your care team if you are unable to keep an appointment. What may interact with this medication? Do not take this medication with any of the following: Cisapride Dronedarone Pimozide Thioridazine This medication may also interact with the following: Aspirin and aspirin-like medications Certain medications that treat or prevent blood clots, such as warfarin, apixaban, dabigatran, and rivaroxaban Cisplatin Cyclosporine Diuretics Medications for infection, such as acyclovir, adefovir, amphotericin B, bacitracin , cidofovir, foscarnet, ganciclovir, gentamicin, pentamidine, vancomycin NSAIDs, medications for pain and inflammation, such as ibuprofen  or naproxen Other medications that cause heart rhythm changes Pamidronate Zoledronic acid This list may not describe all possible interactions. Give your health care provider a list of all the medicines, herbs, non-prescription drugs, or dietary supplements you use. Also tell them if you smoke, drink alcohol, or use illegal drugs. Some items may interact with your medicine. What should I watch for while using this medication? Your condition will be monitored carefully while you are receiving this medication. You may need blood work while taking this medication. This medication may make you feel generally unwell. This is not uncommon as chemotherapy can affect healthy cells as well as cancer  cells. Report any side effects. Continue your course of treatment even though you feel ill unless your care team tells you to stop. This medication may increase your risk of getting an infection. Call your care team for advice if you get a fever, chills, sore throat, or other symptoms of a cold or flu. Do not treat yourself. Try to avoid being around people who are sick. Avoid taking medications that contain aspirin, acetaminophen , ibuprofen , naproxen, or ketoprofen unless instructed by your care team. These medications may hide a fever. Be careful brushing or flossing your teeth or using a toothpick because you may get an infection or bleed more easily. If you have any dental work done, tell your dentist you are receiving this medication. This medication can make you more sensitive to cold. Do not drink cold drinks or use ice. Cover exposed skin before coming in contact with cold temperatures or cold objects. When out in cold weather wear warm clothing and cover your mouth and nose to warm the  air that goes into your lungs. Tell your care team if you get sensitive to the cold. Talk to your care team if you or your partner are pregnant or think either of you might be pregnant. This medication can cause serious birth defects if taken during pregnancy and for 9 months after the last dose. A negative pregnancy test is required before starting this medication. A reliable form of contraception is recommended while taking this medication and for 9 months after the last dose. Talk to your care team about effective forms of contraception. Do not father a child while taking this medication and for 6 months after the last dose. Use a condom while having sex during this time period. Do not breastfeed while taking this medication and for 3 months after the last dose. This medication may cause infertility. Talk to your care team if you are concerned about your fertility. What side effects may I notice from receiving this  medication? Side effects that you should report to your care team as soon as possible: Allergic reactions--skin rash, itching, hives, swelling of the face, lips, tongue, or throat Bleeding--bloody or black, tar-like stools, vomiting blood or brown material that looks like coffee grounds, red or dark brown urine, small red or purple spots on skin, unusual bruising or bleeding Dry cough, shortness of breath or trouble breathing Heart rhythm changes--fast or irregular heartbeat, dizziness, feeling faint or lightheaded, chest pain, trouble breathing Infection--fever, chills, cough, sore throat, wounds that don't heal, pain or trouble when passing urine, general feeling of discomfort or being unwell Liver injury--right upper belly pain, loss of appetite, nausea, light-colored stool, dark yellow or brown urine, yellowing skin or eyes, unusual weakness or fatigue Low red blood cell level--unusual weakness or fatigue, dizziness, headache, trouble breathing Muscle injury--unusual weakness or fatigue, muscle pain, dark yellow or brown urine, decrease in amount of urine Pain, tingling, or numbness in the hands or feet Sudden and severe headache, confusion, change in vision, seizures, which may be signs of posterior reversible encephalopathy syndrome (PRES) Unusual bruising or bleeding Side effects that usually do not require medical attention (report to your care team if they continue or are bothersome): Diarrhea Nausea Pain, redness, or swelling with sores inside the mouth or throat Unusual weakness or fatigue Vomiting This list may not describe all possible side effects. Call your doctor for medical advice about side effects. You may report side effects to FDA at 1-800-FDA-1088. Where should I keep my medication? This medication is given in a hospital or clinic. It will not be stored at home. NOTE: This sheet is a summary. It may not cover all possible information. If you have questions about this  medicine, talk to your doctor, pharmacist, or health care provider.  2024 Elsevier/Gold Standard (2023-07-12 00:00:00)

## 2024-06-02 NOTE — Telephone Encounter (Signed)
 Patient has been scheduled for follow-up visit per 06/02/24 LOS.  Pt given an appt calendar with date and time.

## 2024-06-04 ENCOUNTER — Inpatient Hospital Stay

## 2024-06-04 VITALS — BP 142/63 | HR 73 | Temp 97.9°F | Resp 18 | Ht 69.0 in

## 2024-06-04 DIAGNOSIS — C20 Malignant neoplasm of rectum: Secondary | ICD-10-CM

## 2024-06-04 NOTE — Patient Instructions (Addendum)
 [facility] [place] - A DEPT OF . [facility]  Discharge Instructions: Thank you for choosing [facility] to provide your oncology and hematology care.  If you have a lab appointment with the Cancer Center, please go directly to the Cancer Center and check in at the registration area.   Wear comfortable clothing and clothing appropriate for easy access to any Portacath or PICC line.   We strive to give you quality time with your provider. You may need to reschedule your appointment if you arrive late (15 or more minutes).  Arriving late affects you and other patients whose appointments are after yours.  Also, if you miss three or more appointments without notifying the office, you may be dismissed from the clinic at the provider's discretion.      For prescription refill requests, have your pharmacy contact our office and allow 72 hours for refills to be completed.    Today you received the following chemotherapy and/or immunotherapy agents FLUORUORACILFluorouracil Injection What is this medication? FLUOROURACIL  (flure oh YOOR a sil) treats some types of cancer. It works by slowing down the growth of cancer cells. This medicine may be used for other purposes; ask your health care provider or pharmacist if you have questions. COMMON BRAND NAME(S): Adrucil  What should I tell my care team before I take this medication? They need to know if you have any of these conditions: Blood disorders Dihydropyrimidine dehydrogenase (DPD) deficiency Infection, such as chickenpox, cold sores, herpes Kidney disease Liver disease Poor nutrition Recent or ongoing radiation therapy An unusual or allergic reaction to fluorouracil , other medications, foods, dyes, or preservatives If you or your partner are pregnant or trying to get pregnant Breast-feeding How should I use this medication? This medication is injected into a vein. It is administered by your care team in a  hospital or clinic setting. Talk to your care team about the use of this medication in children. Special care may be needed. Overdosage: If you think you have taken too much of this medicine contact a poison control center or emergency room at once. NOTE: This medicine is only for you. Do not share this medicine with others. What if I miss a dose? Keep appointments for follow-up doses. It is important not to miss your dose. Call your care team if you are unable to keep an appointment. What may interact with this medication? Do not take this medication with any of the following: Live virus vaccines This medication may also interact with the following: Medications that treat or prevent blood clots, such as warfarin, enoxaparin , dalteparin This list may not describe all possible interactions. Give your health care provider a list of all the medicines, herbs, non-prescription drugs, or dietary supplements you use. Also tell them if you smoke, drink alcohol, or use illegal drugs. Some items may interact with your medicine. What should I watch for while using this medication? Your condition will be monitored carefully while you are receiving this medication. This medication may make you feel generally unwell. This is not uncommon as chemotherapy can affect healthy cells as well as cancer cells. Report any side effects. Continue your course of treatment even though you feel ill unless your care team tells you to stop. In some cases, you may be given additional medications to help with side effects. Follow all directions for their use. This medication may increase your risk of getting an infection. Call your care team for advice if you get a fever,  chills, sore throat, or other symptoms of a cold or flu. Do not treat yourself. Try to avoid being around people who are sick. This medication may increase your risk to bruise or bleed. Call your care team if you notice any unusual bleeding. Be careful brushing  or flossing your teeth or using a toothpick because you may get an infection or bleed more easily. If you have any dental work done, tell your dentist you are receiving this medication. Avoid taking medications that contain aspirin, acetaminophen , ibuprofen , naproxen, or ketoprofen unless instructed by your care team. These medications may hide a fever. Do not treat diarrhea with over the counter products. Contact your care team if you have diarrhea that lasts more than 2 days or if it is severe and watery. This medication can make you more sensitive to the sun. Keep out of the sun. If you cannot avoid being in the sun, wear protective clothing and sunscreen. Do not use sun lamps, tanning beds, or tanning booths. Talk to your care team if you or your partner wish to become pregnant or think you might be pregnant. This medication can cause serious birth defects if taken during pregnancy and for 3 months after the last dose. A reliable form of contraception is recommended while taking this medication and for 3 months after the last dose. Talk to your care team about effective forms of contraception. Do not father a child while taking this medication and for 3 months after the last dose. Use a condom while having sex during this time period. Do not breastfeed while taking this medication. This medication may cause infertility. Talk to your care team if you are concerned about your fertility. What side effects may I notice from receiving this medication? Side effects that you should report to your care team as soon as possible: Allergic reactions--skin rash, itching, hives, swelling of the face, lips, tongue, or throat Heart attack--pain or tightness in the chest, shoulders, arms, or jaw, nausea, shortness of breath, cold or clammy skin, feeling faint or lightheaded Heart failure--shortness of breath, swelling of the ankles, feet, or hands, sudden weight gain, unusual weakness or fatigue Heart rhythm  changes--fast or irregular heartbeat, dizziness, feeling faint or lightheaded, chest pain, trouble breathing High ammonia level--unusual weakness or fatigue, confusion, loss of appetite, nausea, vomiting, seizures Infection--fever, chills, cough, sore throat, wounds that don't heal, pain or trouble when passing urine, general feeling of discomfort or being unwell Low red blood cell level--unusual weakness or fatigue, dizziness, headache, trouble breathing Pain, tingling, or numbness in the hands or feet, muscle weakness, change in vision, confusion or trouble speaking, loss of balance or coordination, trouble walking, seizures Redness, swelling, and blistering of the skin over hands and feet Severe or prolonged diarrhea Unusual bruising or bleeding Side effects that usually do not require medical attention (report to your care team if they continue or are bothersome): Dry skin Headache Increased tears Nausea Pain, redness, or swelling with sores inside the mouth or throat Sensitivity to light Vomiting This list may not describe all possible side effects. Call your doctor for medical advice about side effects. You may report side effects to FDA at 1-800-FDA-1088. Where should I keep my medication? This medication is given in a hospital or clinic. It will not be stored at home. NOTE: This sheet is a summary. It may not cover all possible information. If you have questions about this medicine, talk to your doctor, pharmacist, or health care provider.  2024 Elsevier/Gold Standard (  2021-12-05 00:00:00)      To help prevent nausea and vomiting after your treatment, we encourage you to take your nausea medication as directed.  BELOW ARE SYMPTOMS THAT SHOULD BE REPORTED IMMEDIATELY: *FEVER GREATER THAN 100.4 F (38 C) OR HIGHER *CHILLS OR SWEATING *NAUSEA AND VOMITING THAT IS NOT CONTROLLED WITH YOUR NAUSEA MEDICATION *UNUSUAL SHORTNESS OF BREATH *UNUSUAL BRUISING OR BLEEDING *URINARY PROBLEMS  (pain or burning when urinating, or frequent urination) *BOWEL PROBLEMS (unusual diarrhea, constipation, pain near the anus) TENDERNESS IN MOUTH AND THROAT WITH OR WITHOUT PRESENCE OF ULCERS (sore throat, sores in mouth, or a toothache) UNUSUAL RASH, SWELLING OR PAIN  UNUSUAL VAGINAL DISCHARGE OR ITCHING   Items with * indicate a potential emergency and should be followed up as soon as possible or go to the Emergency Department if any problems should occur.  Please show the CHEMOTHERAPY ALERT CARD or IMMUNOTHERAPY ALERT CARD at check-in to the Emergency Department and triage nurse.  Should you have questions after your visit or need to cancel or reschedule your appointment, please contact [facility] [place] - A DEPT OF [facility]  Dept: [phone]  and follow the prompts.  Office hours are 8:00 a.m. to 4:30 p.m. Monday - Friday. Please note that voicemails left after 4:00 p.m. may not be returned until the following business day.  We are closed weekends and major holidays. You have access to a nurse at all times for urgent questions. Please call the main number to the clinic Dept: [phone] and follow the prompts.  For any non-urgent questions, you may also contact your provider using MyChart. We now offer e-Visits for anyone 48 and older to request care online for non-urgent symptoms. For details visit [email or web address].   Also download the MyChart app! Go to the app store, search MyChart, open the app, select [facility], and log in with your MyChart username and password.  The chemotherapy medication bag should finish at 46 hours, 96 hours, or 7 days. For example, if your pump is scheduled for 46 hours and it was put on at 4:00 p.m., it should finish at 2:00 p.m. the day it is scheduled to come off regardless of your appointment time.     Estimated time to finish at 1344.   If the display on your pump reads Low Volume and it is beeping, take the batteries  out of the pump and come to the cancer center for it to be taken off.   If the pump alarms go off prior to the pump reading Low Volume then call [phone] and someone can assist you.  If the plunger comes out and the chemotherapy medication is leaking out, please use your home chemo spill kit to clean up the spill. Do NOT use paper towels or other household products.  If you have problems or questions regarding your pump, please call either [phone] (24 hours a day) or the cancer center Monday-Friday 8:00 a.m.- 4:30 p.m. at the clinic number and we will assist you. If you are unable to get assistance, then go to the nearest Emergency Department and ask the staff to contact the IV team for assistance.

## 2024-06-09 ENCOUNTER — Encounter: Payer: Self-pay | Admitting: Hematology and Oncology

## 2024-06-15 NOTE — Progress Notes (Unsigned)
 [facility]  [address] [place],  [place]  [address] [phone]  Clinic Day:  06/16/2024  Referring physician: Sherre Clapper, MD   HISTORY OF PRESENT ILLNESS:  The patient is a 73 y.o. male with  metastatic rectal cancer, including spread of disease to his liver.  He comes in today to be evaluated before heading into his 14th cycle of FOLFOX/Avastin .  He states he tolerated his 13th cycle okay.  His peripheral neuropathy remains an issue, but has not progressed over time.  His oxaliplatin  dose has been decreased by 50%.  Of note, this patient's Ozempic  was discontinued nearly a month ago.  Since this occurred, the patient believes he has had less nausea.   He continues to deny having any GI issues which concern him for overt signs of disease progression.  VITALS:  Blood pressure (!) 153/73, pulse 68, temperature 99 F (37.2 C), temperature source Oral, resp. rate 18, height 5' 9 (1.753 m), weight 170 lb 8 oz (77.3 kg), SpO2 100%. Wt Readings from Last 3 Encounters:  06/16/24 170 lb 8 oz (77.3 kg)  06/02/24 171 lb 14.4 oz (78 kg)  05/19/24 164 lb 12.8 oz (74.8 kg)   Body mass index is 25.18 kg/m.  Performance status (ECOG): 1 - Symptomatic but completely ambulatory  PHYSICAL EXAM:   Physical Exam Vitals and nursing note reviewed.  Constitutional:      General: He is not in acute distress.    Appearance: Normal appearance. He is normal weight.  HENT:     Head: Normocephalic and atraumatic.     Mouth/Throat:     Mouth: Mucous membranes are moist.     Pharynx: Oropharynx is clear. No oropharyngeal exudate or posterior oropharyngeal erythema.  Eyes:     General: No scleral icterus.    Extraocular Movements: Extraocular movements intact.     Conjunctiva/sclera: Conjunctivae normal.     Pupils: Pupils are equal, round, and reactive to light.  Cardiovascular:     Rate and Rhythm: Normal rate and regular rhythm.     Heart sounds: Normal heart sounds. No  murmur heard.    No friction rub. No gallop.  Pulmonary:     Effort: Pulmonary effort is normal.     Breath sounds: Normal breath sounds. No wheezing, rhonchi or rales.  Abdominal:     General: Bowel sounds are normal. There is no distension.     Palpations: Abdomen is soft. There is no hepatomegaly, splenomegaly or mass.     Tenderness: There is no abdominal tenderness.  Musculoskeletal:        General: Normal range of motion.     Cervical back: Normal range of motion and neck supple. No tenderness.     Right lower leg: No edema.     Left lower leg: No edema.  Lymphadenopathy:     Cervical: No cervical adenopathy.     Upper Body:     Right upper body: No supraclavicular or axillary adenopathy.     Left upper body: No supraclavicular or axillary adenopathy.     Lower Body: No right inguinal adenopathy. No left inguinal adenopathy.  Skin:    General: Skin is warm and dry.     Coloration: Skin is not jaundiced.     Findings: No rash.  Neurological:     Mental Status: He is alert and oriented to person, place, and time.     Cranial Nerves: No cranial nerve deficit.  Psychiatric:  Mood and Affect: Mood normal.        Behavior: Behavior normal.        Thought Content: Thought content normal.    LABS:      Latest Ref Rng & Units 06/16/2024    8:39 AM 06/02/2024    9:04 AM 05/19/2024    8:49 AM  CBC  WBC 4.0 - 10.5 K/uL 5.3  4.3  4.6   Hemoglobin 13.0 - 17.0 g/dL 89.8  9.7  9.9   Hematocrit 39.0 - 52.0 % 30.7  30.4  30.3   Platelets 150 - 400 K/uL 100  83  84       Latest Ref Rng & Units 06/16/2024    8:39 AM 06/02/2024    9:04 AM 05/19/2024    8:49 AM  CMP  Glucose 70 - 99 mg/dL 868  893  797   BUN 8 - 23 mg/dL 16  13  13    Creatinine 0.61 - 1.24 mg/dL 9.19  9.27  8.97   Sodium 135 - 145 mmol/L 139  139  140   Potassium 3.5 - 5.1 mmol/L 3.9  4.2  4.1   Chloride 98 - 111 mmol/L 106  106  105   CO2 22 - 32 mmol/L 24  24  25    Calcium  8.9 - 10.3 mg/dL 9.1  9.3  9.3    Total Protein 6.5 - 8.1 g/dL 6.5  6.6  6.6   Total Bilirubin 0.0 - 1.2 mg/dL 0.5  0.5  0.6   Alkaline Phos 38 - 126 U/L 55  51  53   AST 15 - 41 U/L 23  25  21    ALT 0 - 44 U/L 13  14  10     Lab Results  Component Value Date   CEA 7.04 (H) 02/06/2024   CEA 13.98 (H) 10/28/2023    ASSESSMENT & PLAN:  Assessment/Plan:  A 73 y.o. male with metastatic rectal cancer including spread of his disease to his liver.  He will proceed with his 14th cycle of FOLFOX/Avastin  today.  His oxaliplatin  will remain decreased by 50%.  The patient understands there is a high likelihood that his oxaliplatin  will ultimately be discontinued altogether, particularly if his peripheral neuropathy progresses over time.  Overall, he appears to be doing fairly well.  I will see him back in 2 weeks before he heads into his 15th cycle of FOLFOX/Avastin . The patient understands all the plans discussed today and is in agreement with them.  Yaneth Fairbairn DELENA Kerns, MD

## 2024-06-16 ENCOUNTER — Inpatient Hospital Stay: Attending: Oncology

## 2024-06-16 ENCOUNTER — Inpatient Hospital Stay (HOSPITAL_BASED_OUTPATIENT_CLINIC_OR_DEPARTMENT_OTHER): Admitting: Oncology

## 2024-06-16 ENCOUNTER — Inpatient Hospital Stay

## 2024-06-16 ENCOUNTER — Encounter: Payer: Self-pay | Admitting: Oncology

## 2024-06-16 VITALS — BP 153/73 | HR 68 | Temp 99.0°F | Resp 18 | Ht 69.0 in | Wt 170.5 lb

## 2024-06-16 DIAGNOSIS — C20 Malignant neoplasm of rectum: Secondary | ICD-10-CM

## 2024-06-16 DIAGNOSIS — G629 Polyneuropathy, unspecified: Secondary | ICD-10-CM | POA: Diagnosis not present

## 2024-06-16 DIAGNOSIS — Z79899 Other long term (current) drug therapy: Secondary | ICD-10-CM | POA: Insufficient documentation

## 2024-06-16 DIAGNOSIS — Z5111 Encounter for antineoplastic chemotherapy: Secondary | ICD-10-CM | POA: Diagnosis not present

## 2024-06-16 DIAGNOSIS — C787 Secondary malignant neoplasm of liver and intrahepatic bile duct: Secondary | ICD-10-CM | POA: Insufficient documentation

## 2024-06-16 LAB — CBC WITH DIFFERENTIAL (CANCER CENTER ONLY)
Abs Immature Granulocytes: 0.02 K/uL (ref 0.00–0.07)
Basophils Absolute: 0 K/uL (ref 0.0–0.1)
Basophils Relative: 0 %
Eosinophils Absolute: 0.2 K/uL (ref 0.0–0.5)
Eosinophils Relative: 4 %
HCT: 30.7 % — ABNORMAL LOW (ref 39.0–52.0)
Hemoglobin: 10.1 g/dL — ABNORMAL LOW (ref 13.0–17.0)
Immature Granulocytes: 0 %
Lymphocytes Relative: 13 %
Lymphs Abs: 0.7 K/uL (ref 0.7–4.0)
MCH: 32.1 pg (ref 26.0–34.0)
MCHC: 32.9 g/dL (ref 30.0–36.0)
MCV: 97.5 fL (ref 80.0–100.0)
Monocytes Absolute: 0.5 K/uL (ref 0.1–1.0)
Monocytes Relative: 9 %
Neutro Abs: 3.9 K/uL (ref 1.7–7.7)
Neutrophils Relative %: 74 %
Platelet Count: 100 K/uL — ABNORMAL LOW (ref 150–400)
RBC: 3.15 MIL/uL — ABNORMAL LOW (ref 4.22–5.81)
RDW: 15.7 % — ABNORMAL HIGH (ref 11.5–15.5)
WBC Count: 5.3 K/uL (ref 4.0–10.5)
nRBC: 0 % (ref 0.0–0.2)

## 2024-06-16 LAB — CMP (CANCER CENTER ONLY)
ALT: 13 U/L (ref 0–44)
AST: 23 U/L (ref 15–41)
Albumin: 3.9 g/dL (ref 3.5–5.0)
Alkaline Phosphatase: 55 U/L (ref 38–126)
Anion gap: 9 (ref 5–15)
BUN: 16 mg/dL (ref 8–23)
CO2: 24 mmol/L (ref 22–32)
Calcium: 9.1 mg/dL (ref 8.9–10.3)
Chloride: 106 mmol/L (ref 98–111)
Creatinine: 0.8 mg/dL (ref 0.61–1.24)
GFR, Estimated: >60 mL/min (ref >60)
Glucose, Bld: 131 mg/dL — ABNORMAL HIGH (ref 70–99)
Potassium: 3.9 mmol/L (ref 3.5–5.1)
Sodium: 139 mmol/L (ref 135–145)
Total Bilirubin: 0.5 mg/dL (ref 0.0–1.2)
Total Protein: 6.5 g/dL (ref 6.5–8.1)

## 2024-06-16 LAB — TOTAL PROTEIN, URINE DIPSTICK: Protein, ur: NEGATIVE mg/dL

## 2024-06-16 MED ORDER — PALONOSETRON HCL INJECTION 0.25 MG/5ML
0.2500 mg | Freq: Once | INTRAVENOUS | Status: AC
  Administered 2024-06-16: 0.25 mg via INTRAVENOUS
  Filled 2024-06-16: qty 5

## 2024-06-16 MED ORDER — SODIUM CHLORIDE 0.9 % IV SOLN: INTRAVENOUS | Status: DC

## 2024-06-16 MED ORDER — FLUOROURACIL CHEMO INJECTION 2.5 GM/50ML
320.0000 mg/m2 | Freq: Once | INTRAVENOUS | Status: AC
  Administered 2024-06-16: 650 mg via INTRAVENOUS
  Filled 2024-06-16: qty 13

## 2024-06-16 MED ORDER — SODIUM CHLORIDE 0.9 % IV SOLN
5.0000 mg/kg | Freq: Once | INTRAVENOUS | Status: AC
  Administered 2024-06-16: 400 mg via INTRAVENOUS
  Filled 2024-06-16: qty 16

## 2024-06-16 MED ORDER — DEXAMETHASONE SOD PHOSPHATE PF 10 MG/ML IJ SOLN
10.0000 mg | Freq: Once | INTRAMUSCULAR | Status: AC
  Administered 2024-06-16: 10 mg via INTRAVENOUS

## 2024-06-16 MED ORDER — SODIUM CHLORIDE 0.9 % IV SOLN
150.0000 mg | Freq: Once | INTRAVENOUS | Status: AC
  Administered 2024-06-16: 150 mg via INTRAVENOUS
  Filled 2024-06-16: qty 150

## 2024-06-16 MED ORDER — SODIUM CHLORIDE 0.9 % IV SOLN
1920.0000 mg/m2 | INTRAVENOUS | Status: DC
  Administered 2024-06-16: 3900 mg via INTRAVENOUS
  Filled 2024-06-16: qty 78

## 2024-06-16 MED ORDER — FAMOTIDINE IN NACL 20-0.9 MG/50ML-% IV SOLN
20.0000 mg | Freq: Once | INTRAVENOUS | Status: AC
  Administered 2024-06-16: 20 mg via INTRAVENOUS
  Filled 2024-06-16: qty 50

## 2024-06-16 MED ORDER — OXALIPLATIN CHEMO INJECTION 100 MG/20ML
35.0000 mg/m2 | Freq: Once | INTRAVENOUS | Status: AC
  Administered 2024-06-16: 70 mg via INTRAVENOUS
  Filled 2024-06-16: qty 14

## 2024-06-16 MED ORDER — LEUCOVORIN CALCIUM INJECTION 350 MG
320.0000 mg/m2 | Freq: Once | INTRAVENOUS | Status: AC
  Administered 2024-06-16: 652 mg via INTRAVENOUS
  Filled 2024-06-16: qty 32.6

## 2024-06-16 MED ORDER — DIPHENHYDRAMINE HCL 50 MG/ML IJ SOLN
25.0000 mg | Freq: Once | INTRAMUSCULAR | Status: AC
  Administered 2024-06-16: 25 mg via INTRAVENOUS
  Filled 2024-06-16: qty 1

## 2024-06-16 MED ORDER — DEXTROSE 5 % IV SOLN: INTRAVENOUS | Status: DC

## 2024-06-16 NOTE — Patient Instructions (Addendum)
 The chemotherapy medication bag should finish at 46 hours.\[id number]\ For example, if your pump is scheduled for 46 hours and it was put on at 4:00 p.m., it should finish at 2:00 p.m. the day it is scheduled to come off regardless of your appointment time.     Estimated time to finish at noon.   If the display on your pump reads Low Volume and it is beeping, take the batteries out of the pump and come to the cancer center for it to be taken off.   If the pump alarms go off prior to the pump reading Low Volume then call [phone] and someone can assist you.  If the plunger comes out and the chemotherapy medication is leaking out, please use your home chemo spill kit to clean up the spill. Do NOT use paper towels or other household products.  If you have problems or questions regarding your pump, please call either [phone] (24 hours a day) or the cancer center Monday-Friday 8:00 a.m.- 4:30 p.m. at the clinic number and we will assist you. If you are unable to get assistance, then go to the nearest Emergency Department and ask the staff to contact the IV team for assistance.

## 2024-06-18 ENCOUNTER — Inpatient Hospital Stay

## 2024-06-18 VITALS — BP 144/52 | HR 60 | Temp 98.1°F | Resp 18 | Ht 69.0 in | Wt 170.5 lb

## 2024-06-18 DIAGNOSIS — C20 Malignant neoplasm of rectum: Secondary | ICD-10-CM | POA: Diagnosis not present

## 2024-06-18 DIAGNOSIS — Z5111 Encounter for antineoplastic chemotherapy: Secondary | ICD-10-CM | POA: Diagnosis not present

## 2024-06-18 NOTE — Patient Instructions (Signed)
 Fluorouracil  Injection What is this medication? FLUOROURACIL  (flure oh YOOR a sil) treats some types of cancer. It works by slowing down the growth of cancer cells. This medicine may be used for other purposes; ask your health care provider or pharmacist if you have questions. COMMON BRAND NAME(S): Adrucil  What should I tell my care team before I take this medication? They need to know if you have any of these conditions: Blood disorders Dihydropyrimidine dehydrogenase (DPD) deficiency Infection, such as chickenpox, cold sores, herpes Kidney disease Liver disease Poor nutrition Recent or ongoing radiation therapy An unusual or allergic reaction to fluorouracil , other medications, foods, dyes, or preservatives If you or your partner are pregnant or trying to get pregnant Breast-feeding How should I use this medication? This medication is injected into a vein. It is administered by your care team in a hospital or clinic setting. Talk to your care team about the use of this medication in children. Special care may be needed. Overdosage: If you think you have taken too much of this medicine contact a poison control center or emergency room at once. NOTE: This medicine is only for you. Do not share this medicine with others. What if I miss a dose? Keep appointments for follow-up doses. It is important not to miss your dose. Call your care team if you are unable to keep an appointment. What may interact with this medication? Do not take this medication with any of the following: Live virus vaccines This medication may also interact with the following: Medications that treat or prevent blood clots, such as warfarin, enoxaparin, dalteparin This list may not describe all possible interactions. Give your health care provider a list of all the medicines, herbs, non-prescription drugs, or dietary supplements you use. Also tell them if you smoke, drink alcohol, or use illegal drugs. Some items may  interact with your medicine. What should I watch for while using this medication? Your condition will be monitored carefully while you are receiving this medication. This medication may make you feel generally unwell. This is not uncommon as chemotherapy can affect healthy cells as well as cancer cells. Report any side effects. Continue your course of treatment even though you feel ill unless your care team tells you to stop. In some cases, you may be given additional medications to help with side effects. Follow all directions for their use. This medication may increase your risk of getting an infection. Call your care team for advice if you get a fever, chills, sore throat, or other symptoms of a cold or flu. Do not treat yourself. Try to avoid being around people who are sick. This medication may increase your risk to bruise or bleed. Call your care team if you notice any unusual bleeding. Be careful brushing or flossing your teeth or using a toothpick because you may get an infection or bleed more easily. If you have any dental work done, tell your dentist you are receiving this medication. Avoid taking medications that contain aspirin, acetaminophen , ibuprofen, naproxen, or ketoprofen unless instructed by your care team. These medications may hide a fever. Do not treat diarrhea with over the counter products. Contact your care team if you have diarrhea that lasts more than 2 days or if it is severe and watery. This medication can make you more sensitive to the sun. Keep out of the sun. If you cannot avoid being in the sun, wear protective clothing and sunscreen. Do not use sun lamps, tanning beds, or tanning booths. Talk to  your care team if you or your partner wish to become pregnant or think you might be pregnant. This medication can cause serious birth defects if taken during pregnancy and for 3 months after the last dose. A reliable form of contraception is recommended while taking this  medication and for 3 months after the last dose. Talk to your care team about effective forms of contraception. Do not father a child while taking this medication and for 3 months after the last dose. Use a condom while having sex during this time period. Do not breastfeed while taking this medication. This medication may cause infertility. Talk to your care team if you are concerned about your fertility. What side effects may I notice from receiving this medication? Side effects that you should report to your care team as soon as possible: Allergic reactions--skin rash, itching, hives, swelling of the face, lips, tongue, or throat Heart attack--pain or tightness in the chest, shoulders, arms, or jaw, nausea, shortness of breath, cold or clammy skin, feeling faint or lightheaded Heart failure--shortness of breath, swelling of the ankles, feet, or hands, sudden weight gain, unusual weakness or fatigue Heart rhythm changes--fast or irregular heartbeat, dizziness, feeling faint or lightheaded, chest pain, trouble breathing High ammonia level--unusual weakness or fatigue, confusion, loss of appetite, nausea, vomiting, seizures Infection--fever, chills, cough, sore throat, wounds that don't heal, pain or trouble when passing urine, general feeling of discomfort or being unwell Low red blood cell level--unusual weakness or fatigue, dizziness, headache, trouble breathing Pain, tingling, or numbness in the hands or feet, muscle weakness, change in vision, confusion or trouble speaking, loss of balance or coordination, trouble walking, seizures Redness, swelling, and blistering of the skin over hands and feet Severe or prolonged diarrhea Unusual bruising or bleeding Side effects that usually do not require medical attention (report to your care team if they continue or are bothersome): Dry skin Headache Increased tears Nausea Pain, redness, or swelling with sores inside the mouth or throat Sensitivity  to light Vomiting This list may not describe all possible side effects. Call your doctor for medical advice about side effects. You may report side effects to FDA at 1-800-FDA-1088. Where should I keep my medication? This medication is given in a hospital or clinic. It will not be stored at home. NOTE: This sheet is a summary. It may not cover all possible information. If you have questions about this medicine, talk to your doctor, pharmacist, or health care provider.  2024 Elsevier/Gold Standard (2021-12-05 00:00:00)[facility] [place] - A DEPT OF [facility]  Discharge Instructions: Thank you for choosing [facility] to provide your oncology and hematology care.  If you have a lab appointment with the Cancer Center, please go directly to the Cancer Center and check in at the registration area.   Wear comfortable clothing and clothing appropriate for easy access to any Portacath or PICC line.   We strive to give you quality time with your provider. You may need to reschedule your appointment if you arrive late (15 or more minutes).  Arriving late affects you and other patients whose appointments are after yours.  Also, if you miss three or more appointments without notifying the office, you may be dismissed from the clinic at the provider's discretion.      For prescription refill requests, have your pharmacy contact our office and allow 72 hours for refills to be completed.    Today you received the following chemotherapy and/or immunotherapy  agents FLOURUORACIL      To help prevent nausea and vomiting after your treatment, we encourage you to take your nausea medication as directed.  BELOW ARE SYMPTOMS THAT SHOULD BE REPORTED IMMEDIATELY: *FEVER GREATER THAN 100.4 F (38 C) OR HIGHER *CHILLS OR SWEATING *NAUSEA AND VOMITING THAT IS NOT CONTROLLED WITH YOUR NAUSEA MEDICATION *UNUSUAL SHORTNESS OF BREATH *UNUSUAL BRUISING OR BLEEDING *URINARY PROBLEMS  (pain or burning when urinating, or frequent urination) *BOWEL PROBLEMS (unusual diarrhea, constipation, pain near the anus) TENDERNESS IN MOUTH AND THROAT WITH OR WITHOUT PRESENCE OF ULCERS (sore throat, sores in mouth, or a toothache) UNUSUAL RASH, SWELLING OR PAIN  UNUSUAL VAGINAL DISCHARGE OR ITCHING   Items with * indicate a potential emergency and should be followed up as soon as possible or go to the Emergency Department if any problems should occur.  Please show the CHEMOTHERAPY ALERT CARD or IMMUNOTHERAPY ALERT CARD at check-in to the Emergency Department and triage nurse.  Should you have questions after your visit or need to cancel or reschedule your appointment, please contact [facility] [place] - A DEPT OF [facility]  Dept: [phone]  and follow the prompts.  Office hours are 8:00 a.m. to 4:30 p.m. Monday - Friday. Please note that voicemails left after 4:00 p.m. may not be returned until the following business day.  We are closed weekends and major holidays. You have access to a nurse at all times for urgent questions. Please call the main number to the clinic Dept: [phone] and follow the prompts.  For any non-urgent questions, you may also contact your provider using MyChart. We now offer e-Visits for anyone 45 and older to request care online for non-urgent symptoms. For details visit [email or web address].   Also download the MyChart app! Go to the app store, search MyChart, open the app, select [facility], and log in with your MyChart username and password.

## 2024-06-29 NOTE — Progress Notes (Unsigned)
 [facility]  [address] [place],  [place]  [address] [phone]  Clinic Day:  06/30/2024  Referring physician: Sherre Clapper, MD   HISTORY OF PRESENT ILLNESS:  The patient is a 73 y.o. male with  metastatic rectal cancer, including spread of disease to his liver.  He comes in today to be evaluated before heading into his 15th cycle of FOLFOX/Avastin .  He states he tolerated his 14th cycle okay.  His peripheral neuropathy remains an issue, but has not progressed over time.  His oxaliplatin  dose has already been decreased by 50%.  Has been he continues to deny having any GI issues which concern him for overt signs of disease progression.  VITALS:  Blood pressure (!) 143/71, pulse 76, temperature 98.6 F (37 C), temperature source Oral, resp. rate 16, height 5' 9 (1.753 m), weight 169 lb 12.8 oz (77 kg), SpO2 97%. Wt Readings from Last 3 Encounters:  06/30/24 169 lb 12.8 oz (77 kg)  06/18/24 170 lb 8 oz (77.3 kg)  06/16/24 170 lb 8 oz (77.3 kg)   Body mass index is 25.08 kg/m.  Performance status (ECOG): 1 - Symptomatic but completely ambulatory  PHYSICAL EXAM:   Physical Exam Vitals and nursing note reviewed.  Constitutional:      General: He is not in acute distress.    Appearance: Normal appearance. He is normal weight.  HENT:     Head: Normocephalic and atraumatic.     Mouth/Throat:     Mouth: Mucous membranes are moist.     Pharynx: Oropharynx is clear. No oropharyngeal exudate or posterior oropharyngeal erythema.  Eyes:     General: No scleral icterus.    Extraocular Movements: Extraocular movements intact.     Conjunctiva/sclera: Conjunctivae normal.     Pupils: Pupils are equal, round, and reactive to light.  Cardiovascular:     Rate and Rhythm: Normal rate and regular rhythm.     Heart sounds: Normal heart sounds. No murmur heard.    No friction rub. No gallop.  Pulmonary:     Effort: Pulmonary effort is normal.     Breath sounds:  Normal breath sounds. No wheezing, rhonchi or rales.  Abdominal:     General: Bowel sounds are normal. There is no distension.     Palpations: Abdomen is soft. There is no hepatomegaly, splenomegaly or mass.     Tenderness: There is no abdominal tenderness.  Musculoskeletal:        General: Normal range of motion.     Cervical back: Normal range of motion and neck supple. No tenderness.     Right lower leg: No edema.     Left lower leg: No edema.  Lymphadenopathy:     Cervical: No cervical adenopathy.     Upper Body:     Right upper body: No supraclavicular or axillary adenopathy.     Left upper body: No supraclavicular or axillary adenopathy.     Lower Body: No right inguinal adenopathy. No left inguinal adenopathy.  Skin:    General: Skin is warm and dry.     Coloration: Skin is not jaundiced.     Findings: No rash.  Neurological:     Mental Status: He is alert and oriented to person, place, and time.     Cranial Nerves: No cranial nerve deficit.  Psychiatric:        Mood and Affect: Mood normal.        Behavior: Behavior normal.  Thought Content: Thought content normal.    LABS:      Latest Ref Rng & Units 06/30/2024    9:04 AM 06/16/2024    8:39 AM 06/02/2024    9:04 AM  CBC  WBC 4.0 - 10.5 K/uL 5.3  5.3  4.3   Hemoglobin 13.0 - 17.0 g/dL 89.9  89.8  9.7   Hematocrit 39.0 - 52.0 % 31.4  30.7  30.4   Platelets 150 - 400 K/uL 139  100  83       Latest Ref Rng & Units 06/30/2024    9:04 AM 06/16/2024    8:39 AM 06/02/2024    9:04 AM  CMP  Glucose 70 - 99 mg/dL 838  868  893   BUN 8 - 23 mg/dL 14  16  13    Creatinine 0.61 - 1.24 mg/dL 9.08  9.19  9.27   Sodium 135 - 145 mmol/L 139  139  139   Potassium 3.5 - 5.1 mmol/L 4.6  3.9  4.2   Chloride 98 - 111 mmol/L 104  106  106   CO2 22 - 32 mmol/L 25  24  24    Calcium  8.9 - 10.3 mg/dL 9.1  9.1  9.3   Total Protein 6.5 - 8.1 g/dL 6.5  6.5  6.6   Total Bilirubin 0.0 - 1.2 mg/dL 0.5  0.5  0.5   Alkaline Phos 38  - 126 U/L 51  55  51   AST 15 - 41 U/L 34  23  25   ALT 0 - 44 U/L 11  13  14     Lab Results  Component Value Date   CEA 7.04 (H) 02/06/2024   CEA 13.98 (H) 10/28/2023    ASSESSMENT & PLAN:  Assessment/Plan:  A 73 y.o. male with metastatic rectal cancer including spread of his disease to his liver.  He will proceed with his 15th cycle of FOLFOX/Avastin  today.  His oxaliplatin  will remain decreased by 50%.  The patient understands there is a high likelihood that his oxaliplatin  will ultimately be discontinued altogether, particularly if his peripheral neuropathy progresses over time.  Overall, he appears to be doing fairly well.  I will see him back in 2 weeks before he heads into his potential 16th cycle of FOLFOX/Avastin .  An abdominal MRI will be done a day before his next visit to ascertain his new disease baseline after 15 cycles of FOLFOX/Avastin .  The patient understands all the plans discussed today and is in agreement with them.  Onyx Edgley DELENA Kerns, MD

## 2024-06-30 ENCOUNTER — Inpatient Hospital Stay

## 2024-06-30 ENCOUNTER — Inpatient Hospital Stay: Admitting: Oncology

## 2024-06-30 ENCOUNTER — Telehealth: Payer: Self-pay | Admitting: Oncology

## 2024-06-30 ENCOUNTER — Encounter: Payer: Self-pay | Admitting: Oncology

## 2024-06-30 ENCOUNTER — Other Ambulatory Visit: Payer: Self-pay | Admitting: Oncology

## 2024-06-30 VITALS — BP 143/71 | HR 76 | Temp 98.6°F | Resp 16 | Ht 69.0 in | Wt 169.8 lb

## 2024-06-30 DIAGNOSIS — C787 Secondary malignant neoplasm of liver and intrahepatic bile duct: Secondary | ICD-10-CM | POA: Diagnosis not present

## 2024-06-30 DIAGNOSIS — C20 Malignant neoplasm of rectum: Secondary | ICD-10-CM | POA: Diagnosis not present

## 2024-06-30 DIAGNOSIS — Z5111 Encounter for antineoplastic chemotherapy: Secondary | ICD-10-CM | POA: Diagnosis not present

## 2024-06-30 LAB — CBC WITH DIFFERENTIAL (CANCER CENTER ONLY)
Abs Immature Granulocytes: 0.03 K/uL (ref 0.00–0.07)
Basophils Absolute: 0 K/uL (ref 0.0–0.1)
Basophils Relative: 0 %
Eosinophils Absolute: 0.2 K/uL (ref 0.0–0.5)
Eosinophils Relative: 4 %
HCT: 31.4 % — ABNORMAL LOW (ref 39.0–52.0)
Hemoglobin: 10 g/dL — ABNORMAL LOW (ref 13.0–17.0)
Immature Granulocytes: 1 %
Lymphocytes Relative: 11 %
Lymphs Abs: 0.6 K/uL — ABNORMAL LOW (ref 0.7–4.0)
MCH: 31.3 pg (ref 26.0–34.0)
MCHC: 31.8 g/dL (ref 30.0–36.0)
MCV: 98.4 fL (ref 80.0–100.0)
Monocytes Absolute: 0.5 K/uL (ref 0.1–1.0)
Monocytes Relative: 9 %
Neutro Abs: 4 K/uL (ref 1.7–7.7)
Neutrophils Relative %: 75 %
Platelet Count: 139 K/uL — ABNORMAL LOW (ref 150–400)
RBC: 3.19 MIL/uL — ABNORMAL LOW (ref 4.22–5.81)
RDW: 15.3 % (ref 11.5–15.5)
WBC Count: 5.3 K/uL (ref 4.0–10.5)
nRBC: 0 % (ref 0.0–0.2)

## 2024-06-30 LAB — CMP (CANCER CENTER ONLY)
ALT: 11 U/L (ref 0–44)
AST: 34 U/L (ref 15–41)
Albumin: 3.5 g/dL (ref 3.5–5.0)
Alkaline Phosphatase: 51 U/L (ref 38–126)
Anion gap: 10 (ref 5–15)
BUN: 14 mg/dL (ref 8–23)
CO2: 25 mmol/L (ref 22–32)
Calcium: 9.1 mg/dL (ref 8.9–10.3)
Chloride: 104 mmol/L (ref 98–111)
Creatinine: 0.91 mg/dL (ref 0.61–1.24)
GFR, Estimated: >60 mL/min (ref >60)
Glucose, Bld: 161 mg/dL — ABNORMAL HIGH (ref 70–99)
Potassium: 4.6 mmol/L (ref 3.5–5.1)
Sodium: 139 mmol/L (ref 135–145)
Total Bilirubin: 0.5 mg/dL (ref 0.0–1.2)
Total Protein: 6.5 g/dL (ref 6.5–8.1)

## 2024-06-30 LAB — TOTAL PROTEIN, URINE DIPSTICK: Protein, ur: NEGATIVE mg/dL

## 2024-06-30 MED ORDER — DEXAMETHASONE SOD PHOSPHATE PF 10 MG/ML IJ SOLN
10.0000 mg | Freq: Once | INTRAMUSCULAR | Status: AC
  Administered 2024-06-30: 10 mg via INTRAVENOUS

## 2024-06-30 MED ORDER — SODIUM CHLORIDE 0.9 % IV SOLN
1920.0000 mg/m2 | INTRAVENOUS | Status: DC
  Administered 2024-06-30: 3900 mg via INTRAVENOUS
  Filled 2024-06-30: qty 78

## 2024-06-30 MED ORDER — OXALIPLATIN CHEMO INJECTION 100 MG/20ML
35.0000 mg/m2 | Freq: Once | INTRAVENOUS | Status: AC
  Administered 2024-06-30: 70 mg via INTRAVENOUS
  Filled 2024-06-30: qty 14

## 2024-06-30 MED ORDER — DEXTROSE 5 % IV SOLN: INTRAVENOUS | Status: DC

## 2024-06-30 MED ORDER — FAMOTIDINE IN NACL 20-0.9 MG/50ML-% IV SOLN
20.0000 mg | Freq: Once | INTRAVENOUS | Status: AC
  Administered 2024-06-30: 20 mg via INTRAVENOUS
  Filled 2024-06-30: qty 50

## 2024-06-30 MED ORDER — DIPHENHYDRAMINE HCL 50 MG/ML IJ SOLN
25.0000 mg | Freq: Once | INTRAMUSCULAR | Status: AC
  Administered 2024-06-30: 25 mg via INTRAVENOUS
  Filled 2024-06-30: qty 1

## 2024-06-30 MED ORDER — PALONOSETRON HCL INJECTION 0.25 MG/5ML
0.2500 mg | Freq: Once | INTRAVENOUS | Status: AC
  Administered 2024-06-30: 0.25 mg via INTRAVENOUS
  Filled 2024-06-30: qty 5

## 2024-06-30 MED ORDER — FLUOROURACIL CHEMO INJECTION 2.5 GM/50ML
320.0000 mg/m2 | Freq: Once | INTRAVENOUS | Status: AC
  Administered 2024-06-30: 650 mg via INTRAVENOUS
  Filled 2024-06-30: qty 13

## 2024-06-30 MED ORDER — SODIUM CHLORIDE 0.9 % IV SOLN: INTRAVENOUS | Status: DC

## 2024-06-30 MED ORDER — SODIUM CHLORIDE 0.9 % IV SOLN
150.0000 mg | Freq: Once | INTRAVENOUS | Status: AC
  Administered 2024-06-30: 150 mg via INTRAVENOUS
  Filled 2024-06-30: qty 150

## 2024-06-30 MED ORDER — SODIUM CHLORIDE 0.9 % IV SOLN
5.0000 mg/kg | Freq: Once | INTRAVENOUS | Status: AC
  Administered 2024-06-30: 400 mg via INTRAVENOUS
  Filled 2024-06-30: qty 16

## 2024-06-30 MED ORDER — SODIUM CHLORIDE 0.9% FLUSH: 10.0000 mL | INTRAVENOUS | Status: DC | PRN

## 2024-06-30 MED ORDER — LEUCOVORIN CALCIUM INJECTION 350 MG
320.0000 mg/m2 | Freq: Once | INTRAVENOUS | Status: AC
  Administered 2024-06-30: 652 mg via INTRAVENOUS
  Filled 2024-06-30: qty 32.6

## 2024-06-30 NOTE — Progress Notes (Signed)
 Epoetin  held HGB 10- Confirmed with Devere Manzanilla Pharm D.

## 2024-06-30 NOTE — Patient Instructions (Signed)
 The chemotherapy medication bag should finish at 46 hours,For example, if your pump is scheduled for 46 hours and it was put on at 4:00 p.m., it should finish at 2:00 p.m. the day it is scheduled to come off regardless of your appointment time.     Estimated time to finish at 1 pm.   If the display on your pump reads Low Volume and it is beeping, take the batteries out of the pump and come to the cancer center for it to be taken off.   If the pump alarms go off prior to the pump reading Low Volume then call [phone] and someone can assist you.  If the plunger comes out and the chemotherapy medication is leaking out, please use your home chemo spill kit to clean up the spill. Do NOT use paper towels or other household products.  If you have problems or questions regarding your pump, please call either [phone] (24 hours a day) or the cancer center Monday-Friday 8:00 a.m.- 4:30 p.m. at the clinic number and we will assist you. If you are unable to get assistance, then go to the nearest Emergency Department and ask the staff to contact the IV team for assistance.

## 2024-06-30 NOTE — Telephone Encounter (Signed)
 Patient has been scheduled for follow-up visit per 06/29/24 LOS.  Pt given an appt calendar with date and time.

## 2024-07-02 ENCOUNTER — Inpatient Hospital Stay

## 2024-07-06 ENCOUNTER — Encounter: Payer: Self-pay | Admitting: Oncology

## 2024-07-08 ENCOUNTER — Ambulatory Visit (HOSPITAL_BASED_OUTPATIENT_CLINIC_OR_DEPARTMENT_OTHER)
Admission: RE | Admit: 2024-07-08 | Discharge: 2024-07-08 | Disposition: A | Discharge status: Home / Self Care | Source: Ambulatory Visit | Attending: Oncology | Admitting: Oncology

## 2024-07-08 ENCOUNTER — Ambulatory Visit (INDEPENDENT_AMBULATORY_CARE_PROVIDER_SITE_OTHER)
Admission: RE | Admit: 2024-07-08 | Discharge: 2024-07-08 | Disposition: A | Discharge status: Home / Self Care | Source: Ambulatory Visit | Attending: Oncology | Admitting: Oncology

## 2024-07-08 DIAGNOSIS — C20 Malignant neoplasm of rectum: Secondary | ICD-10-CM | POA: Diagnosis not present

## 2024-07-08 DIAGNOSIS — C787 Secondary malignant neoplasm of liver and intrahepatic bile duct: Secondary | ICD-10-CM | POA: Diagnosis not present

## 2024-07-08 DIAGNOSIS — N281 Cyst of kidney, acquired: Secondary | ICD-10-CM | POA: Diagnosis not present

## 2024-07-08 DIAGNOSIS — C189 Malignant neoplasm of colon, unspecified: Secondary | ICD-10-CM | POA: Diagnosis not present

## 2024-07-08 DIAGNOSIS — K4090 Unilateral inguinal hernia, without obstruction or gangrene, not specified as recurrent: Secondary | ICD-10-CM | POA: Diagnosis not present

## 2024-07-08 DIAGNOSIS — I700 Atherosclerosis of aorta: Secondary | ICD-10-CM | POA: Diagnosis not present

## 2024-07-08 MED ORDER — GADOBUTROL 1 MMOL/ML IV SOLN
7.5000 mL | Freq: Once | INTRAVENOUS | Status: AC | PRN
  Administered 2024-07-08: 7.5 mL via INTRAVENOUS

## 2024-07-13 ENCOUNTER — Encounter: Payer: Self-pay | Admitting: Podiatry

## 2024-07-13 ENCOUNTER — Other Ambulatory Visit: Payer: Self-pay | Admitting: Oncology

## 2024-07-13 ENCOUNTER — Ambulatory Visit (INDEPENDENT_AMBULATORY_CARE_PROVIDER_SITE_OTHER): Admitting: Podiatry

## 2024-07-13 DIAGNOSIS — C20 Malignant neoplasm of rectum: Secondary | ICD-10-CM

## 2024-07-13 DIAGNOSIS — E1142 Type 2 diabetes mellitus with diabetic polyneuropathy: Secondary | ICD-10-CM

## 2024-07-13 DIAGNOSIS — B351 Tinea unguium: Secondary | ICD-10-CM | POA: Diagnosis not present

## 2024-07-13 DIAGNOSIS — M79674 Pain in right toe(s): Secondary | ICD-10-CM

## 2024-07-13 DIAGNOSIS — M79675 Pain in left toe(s): Secondary | ICD-10-CM | POA: Diagnosis not present

## 2024-07-13 NOTE — Progress Notes (Unsigned)
 [facility]  [address] [place],  [place]  [address] [phone]  Clinic Day:  07/14/2024  Referring physician: Sherre Clapper, MD   HISTORY OF PRESENT ILLNESS:  The patient is a 73 y.o. male with  metastatic rectal cancer, including spread of disease to his liver.  He comes in today to go over his CT scans to ascertain his new disease baseline after 15 cycles of FOLFOX/Avastin .  He states he tolerated his 15th cycle okay.  His peripheral neuropathy remains an issue, but has not progressed over time.  His oxaliplatin  dose has already been decreased by 50+%.  He continues to deny having any GI issues which concern him for overt signs of disease progression.  VITALS:  Blood pressure 137/68, pulse 70, temperature 98 F (36.7 C), temperature source Oral, resp. rate 16, height 5' 9 (1.753 m), weight 169 lb 8 oz (76.9 kg), SpO2 100%. Wt Readings from Last 3 Encounters:  07/14/24 169 lb 8 oz (76.9 kg)  06/30/24 169 lb 12.8 oz (77 kg)  06/18/24 170 lb 8 oz (77.3 kg)   Body mass index is 25.03 kg/m.  Performance status (ECOG): 1 - Symptomatic but completely ambulatory  PHYSICAL EXAM:   Physical Exam Vitals and nursing note reviewed.  Constitutional:      General: He is not in acute distress.    Appearance: Normal appearance. He is normal weight.  HENT:     Head: Normocephalic and atraumatic.     Mouth/Throat:     Mouth: Mucous membranes are moist.     Pharynx: Oropharynx is clear. No oropharyngeal exudate or posterior oropharyngeal erythema.  Eyes:     General: No scleral icterus.    Extraocular Movements: Extraocular movements intact.     Conjunctiva/sclera: Conjunctivae normal.     Pupils: Pupils are equal, round, and reactive to light.  Cardiovascular:     Rate and Rhythm: Normal rate and regular rhythm.     Heart sounds: Normal heart sounds. No murmur heard.    No friction rub. No gallop.  Pulmonary:     Effort: Pulmonary effort is normal.     Breath  sounds: Normal breath sounds. No wheezing, rhonchi or rales.  Abdominal:     General: Bowel sounds are normal. There is no distension.     Palpations: Abdomen is soft. There is no hepatomegaly, splenomegaly or mass.     Tenderness: There is no abdominal tenderness.  Musculoskeletal:        General: Normal range of motion.     Cervical back: Normal range of motion and neck supple. No tenderness.     Right lower leg: No edema.     Left lower leg: No edema.  Lymphadenopathy:     Cervical: No cervical adenopathy.     Upper Body:     Right upper body: No supraclavicular or axillary adenopathy.     Left upper body: No supraclavicular or axillary adenopathy.     Lower Body: No right inguinal adenopathy. No left inguinal adenopathy.  Skin:    General: Skin is warm and dry.     Coloration: Skin is not jaundiced.     Findings: No rash.  Neurological:     Mental Status: He is alert and oriented to person, place, and time.     Cranial Nerves: No cranial nerve deficit.  Psychiatric:        Mood and Affect: Mood normal.        Behavior: Behavior normal.  Thought Content: Thought content normal.   SCANS: An MRI of his abdomen/pelvis revealed the following: FINDINGS:   LIVER: Multiple mildly T2 hyperintense, hypoenhancing lesions are identified within both lobes of the liver. These demonstrate restricted diffusion including on series 10. Examples within the high right hepatic lobe at 4 mm on image 19/29, similar to on the prior exam. Within the subcapsular posterior right hepatic lobe at 8 mm on image 36/29, similar to the prior exam (when remeasured ). No new or enlarging liver lesions. No intrahepatic biliary duct dilatation.   GALLBLADDER AND BILIARY SYSTEM: Gallbladder is unremarkable. No intrahepatic or extrahepatic ductal dilation.   SPLEEN: Normal in size and morphology.   PANCREAS: Normal, without duct dilatation or acute inflammation.   ADRENAL GLANDS: Unremarkable.    KIDNEYS: Subcentimeter upper pole right renal cyst does . not warrant specific imaging follow up.   LYMPH NODES: No lymphadenopathy.   VASCULATURE: Aortic atherosclerosis.   PERITONEUM: No ascites.   BOWEL: Normal stomach, without wall thickening. Grossly normal bowel loops.   ABDOMINAL WALL: No acute abnormality.   BONES: No acute abnormality.   VISUALIZED LUNG BASES AND PLEURAL SPACES: Normal heart size. No pleural fluid.   IMPRESSION: 1. Similar tiny bilateral hepatic metastases. No new or progressive disease within the abdomen. 2. Aortic atherosclerosis (ICD10-I70.0). 3. Pelvic MRI dictated separately.  LABS:      Latest Ref Rng & Units 07/14/2024   10:12 AM 06/30/2024    9:04 AM 06/16/2024    8:39 AM  CBC  WBC 4.0 - 10.5 K/uL 4.9  5.3  5.3   Hemoglobin 13.0 - 17.0 g/dL 9.6  89.9  89.8   Hematocrit 39.0 - 52.0 % 30.1  31.4  30.7   Platelets 150 - 400 K/uL 105  139  100       Latest Ref Rng & Units 07/14/2024   10:12 AM 06/30/2024    9:04 AM 06/16/2024    8:39 AM  CMP  Glucose 70 - 99 mg/dL 94  838  868   BUN 8 - 23 mg/dL 11  14  16    Creatinine 0.61 - 1.24 mg/dL 9.25  9.08  9.19   Sodium 135 - 145 mmol/L 139  139  139   Potassium 3.5 - 5.1 mmol/L 4.0  4.6  3.9   Chloride 98 - 111 mmol/L 105  104  106   CO2 22 - 32 mmol/L 25  25  24    Calcium  8.9 - 10.3 mg/dL 9.3  9.1  9.1   Total Protein 6.5 - 8.1 g/dL 6.7  6.5  6.5   Total Bilirubin 0.0 - 1.2 mg/dL 0.4  0.5  0.5   Alkaline Phos 38 - 126 U/L 54  51  55   AST 15 - 41 U/L 25  34  23   ALT 0 - 44 U/L 11  11  13     Lab Results  Component Value Date   CEA 5.27 (H) 07/14/2024   CEA 7.04 (H) 02/06/2024   ASSESSMENT & PLAN:  Assessment/Plan:  A 73 y.o. male with metastatic rectal cancer including spread of his disease to his liver.  In clinic today, I went over his abdominal MRI images with him, for which she could see that his liver lesions remain very small.  There may no new signs of disease  progression.  Based upon this, he will proceed with his 16th cycle of FOLFOX/Avastin  today.  His oxaliplatin  will remain decreased by 50+%.  If  his neuropathy gets even worse, the patient understands his oxaliplatin  will ultimately be discontinued.  Otherwise, I will see him back in 2 weeks before he heads into his 17th cycle of FOLFOX/Avastin .  The patient understands all the plans discussed today and is in agreement with them.  Terrilyn Tyner DELENA Kerns, MD

## 2024-07-13 NOTE — Progress Notes (Signed)
  Subjective:  Patient ID: Joshua Nelson, male    DOB: 09-09-1950,  MRN: [id number]  Chief Complaint  Patient presents with   [facility]    [facility] no callus.  A1c pt report 6 something. ASA     73 y.o. male presents with the above complaint. History confirmed with patient. Patient presenting with pain related to dystrophic thickened elongated nails. Patient is unable to trim own nails related to nail dystrophy and/or mobility issues. Patient does have a history of T2DM with peripheral neuropathy.  Is undergoing chemotherapy for hepatic cancer  Objective:  Physical Exam: warm, good capillary refill,  nail exam onychomycosis of the toenails, onycholysis, and dystrophic nails DP and PT pulses 1/4 bilateral, and protective sensation absent Left Foot:  Pain with palpation of nails due to elongation and dystrophic growth.  Right Foot: Pain with palpation of nails due to elongation and dystrophic growth.   Assessment:   1. Pain due to onychomycosis of toenails of both feet   2. DM type 2 with diabetic peripheral neuropathy (HCC)     Plan:  Patient was evaluated and treated and all questions answered.  #Onychomycosis with pain  -Nails palliatively debrided as below. -Educated on self-care  Procedure: Nail Debridement Rationale: Pain Type of Debridement: manual, sharp debridement. Instrumentation: Nail nipper, rotary burr. Number of Nails: 10  Patient educated on diabetes. Discussed proper diabetic foot care and discussed risks and complications of disease. Educated patient in depth on reasons to return to the office immediately should he/she discover anything concerning or new on the feet. All questions answered. Discussed proper shoes as well.     Return in about 3 months (around 10/11/2024) for Diabetic Foot Care.         Ethan Saddler, DPM [facility] / [facility]

## 2024-07-14 ENCOUNTER — Inpatient Hospital Stay

## 2024-07-14 ENCOUNTER — Encounter: Payer: Self-pay | Admitting: Oncology

## 2024-07-14 ENCOUNTER — Other Ambulatory Visit: Payer: Self-pay | Admitting: Oncology

## 2024-07-14 ENCOUNTER — Inpatient Hospital Stay: Attending: Oncology | Admitting: Oncology

## 2024-07-14 VITALS — BP 137/68 | HR 70 | Temp 98.0°F | Resp 16 | Ht 69.0 in | Wt 169.5 lb

## 2024-07-14 DIAGNOSIS — Z5111 Encounter for antineoplastic chemotherapy: Secondary | ICD-10-CM | POA: Diagnosis present

## 2024-07-14 DIAGNOSIS — C787 Secondary malignant neoplasm of liver and intrahepatic bile duct: Secondary | ICD-10-CM | POA: Insufficient documentation

## 2024-07-14 DIAGNOSIS — D6481 Anemia due to antineoplastic chemotherapy: Secondary | ICD-10-CM | POA: Insufficient documentation

## 2024-07-14 DIAGNOSIS — Z79899 Other long term (current) drug therapy: Secondary | ICD-10-CM | POA: Diagnosis not present

## 2024-07-14 DIAGNOSIS — C20 Malignant neoplasm of rectum: Secondary | ICD-10-CM

## 2024-07-14 DIAGNOSIS — G629 Polyneuropathy, unspecified: Secondary | ICD-10-CM | POA: Diagnosis not present

## 2024-07-14 DIAGNOSIS — Z452 Encounter for adjustment and management of vascular access device: Secondary | ICD-10-CM

## 2024-07-14 LAB — CBC WITH DIFFERENTIAL (CANCER CENTER ONLY)
Abs Immature Granulocytes: 0.02 K/uL (ref 0.00–0.07)
Basophils Absolute: 0 K/uL (ref 0.0–0.1)
Basophils Relative: 0 %
Eosinophils Absolute: 0.2 K/uL (ref 0.0–0.5)
Eosinophils Relative: 3 %
HCT: 30.1 % — ABNORMAL LOW (ref 39.0–52.0)
Hemoglobin: 9.6 g/dL — ABNORMAL LOW (ref 13.0–17.0)
Immature Granulocytes: 0 %
Lymphocytes Relative: 13 %
Lymphs Abs: 0.7 K/uL (ref 0.7–4.0)
MCH: 31.1 pg (ref 26.0–34.0)
MCHC: 31.9 g/dL (ref 30.0–36.0)
MCV: 97.4 fL (ref 80.0–100.0)
Monocytes Absolute: 0.6 K/uL (ref 0.1–1.0)
Monocytes Relative: 11 %
Neutro Abs: 3.5 K/uL (ref 1.7–7.7)
Neutrophils Relative %: 73 %
Platelet Count: 105 K/uL — ABNORMAL LOW (ref 150–400)
RBC: 3.09 MIL/uL — ABNORMAL LOW (ref 4.22–5.81)
RDW: 15.9 % — ABNORMAL HIGH (ref 11.5–15.5)
WBC Count: 4.9 K/uL (ref 4.0–10.5)
nRBC: 0 % (ref 0.0–0.2)

## 2024-07-14 LAB — TOTAL PROTEIN, URINE DIPSTICK: Protein, ur: NEGATIVE mg/dL

## 2024-07-14 LAB — CMP (CANCER CENTER ONLY)
ALT: 11 U/L (ref 0–44)
AST: 25 U/L (ref 15–41)
Albumin: 3.5 g/dL (ref 3.5–5.0)
Alkaline Phosphatase: 54 U/L (ref 38–126)
Anion gap: 9 (ref 5–15)
BUN: 11 mg/dL (ref 8–23)
CO2: 25 mmol/L (ref 22–32)
Calcium: 9.3 mg/dL (ref 8.9–10.3)
Chloride: 105 mmol/L (ref 98–111)
Creatinine: 0.74 mg/dL (ref 0.61–1.24)
GFR, Estimated: >60 mL/min (ref >60)
Glucose, Bld: 94 mg/dL (ref 70–99)
Potassium: 4 mmol/L (ref 3.5–5.1)
Sodium: 139 mmol/L (ref 135–145)
Total Bilirubin: 0.4 mg/dL (ref 0.0–1.2)
Total Protein: 6.7 g/dL (ref 6.5–8.1)

## 2024-07-14 LAB — CEA (ACCESS): CEA (CHCC): 5.27 ng/mL — ABNORMAL HIGH (ref 0.00–5.00)

## 2024-07-14 MED ORDER — DEXTROSE 5 % IV SOLN: INTRAVENOUS | Status: DC

## 2024-07-14 MED ORDER — LEUCOVORIN CALCIUM INJECTION 350 MG
320.0000 mg/m2 | Freq: Once | INTRAVENOUS | Status: AC
  Administered 2024-07-14: 652 mg via INTRAVENOUS
  Filled 2024-07-14: qty 32.6

## 2024-07-14 MED ORDER — DIPHENHYDRAMINE HCL 50 MG/ML IJ SOLN
25.0000 mg | Freq: Once | INTRAMUSCULAR | Status: AC
  Administered 2024-07-14: 25 mg via INTRAVENOUS
  Filled 2024-07-14: qty 1

## 2024-07-14 MED ORDER — DEXAMETHASONE SOD PHOSPHATE PF 10 MG/ML IJ SOLN
10.0000 mg | Freq: Once | INTRAMUSCULAR | Status: AC
  Administered 2024-07-14: 10 mg via INTRAVENOUS

## 2024-07-14 MED ORDER — SODIUM CHLORIDE 0.9 % IV SOLN
5.0000 mg/kg | Freq: Once | INTRAVENOUS | Status: AC
  Administered 2024-07-14: 400 mg via INTRAVENOUS
  Filled 2024-07-14: qty 16

## 2024-07-14 MED ORDER — OXALIPLATIN CHEMO INJECTION 100 MG/20ML
35.0000 mg/m2 | Freq: Once | INTRAVENOUS | Status: AC
  Administered 2024-07-14: 70 mg via INTRAVENOUS
  Filled 2024-07-14: qty 14

## 2024-07-14 MED ORDER — PALONOSETRON HCL INJECTION 0.25 MG/5ML
0.2500 mg | Freq: Once | INTRAVENOUS | Status: AC
  Administered 2024-07-14: 0.25 mg via INTRAVENOUS
  Filled 2024-07-14: qty 5

## 2024-07-14 MED ORDER — SODIUM CHLORIDE 0.9 % IV SOLN
1920.0000 mg/m2 | INTRAVENOUS | Status: DC
  Administered 2024-07-14: 3900 mg via INTRAVENOUS
  Filled 2024-07-14: qty 78

## 2024-07-14 MED ORDER — FLUOROURACIL CHEMO INJECTION 2.5 GM/50ML
320.0000 mg/m2 | Freq: Once | INTRAVENOUS | Status: AC
  Administered 2024-07-14: 650 mg via INTRAVENOUS
  Filled 2024-07-14: qty 13

## 2024-07-14 MED ORDER — SODIUM CHLORIDE 0.9 % IV SOLN: INTRAVENOUS | Status: DC

## 2024-07-14 MED ORDER — EPOETIN ALFA-EPBX 20000 UNIT/ML IJ SOLN
20000.0000 [IU] | Freq: Once | INTRAMUSCULAR | Status: AC
  Administered 2024-07-14: 20000 [IU] via SUBCUTANEOUS
  Filled 2024-07-14: qty 1

## 2024-07-14 MED ORDER — FAMOTIDINE IN NACL 20-0.9 MG/50ML-% IV SOLN
20.0000 mg | Freq: Once | INTRAVENOUS | Status: AC
  Administered 2024-07-14: 20 mg via INTRAVENOUS
  Filled 2024-07-14: qty 50

## 2024-07-14 MED ORDER — SODIUM CHLORIDE 0.9 % IV SOLN
150.0000 mg | Freq: Once | INTRAVENOUS | Status: AC
  Administered 2024-07-14: 150 mg via INTRAVENOUS
  Filled 2024-07-14: qty 150

## 2024-07-14 MED ORDER — SODIUM CHLORIDE 0.9% FLUSH: 10.0000 mL | INTRAVENOUS | Status: DC | PRN

## 2024-07-14 NOTE — Patient Instructions (Signed)
 The chemotherapy medication bag should finish at 46 hours. For example, if your pump is scheduled for 46 hours and it was put on at 4:00 p.m., it should finish at 2:00 p.m. the day it is scheduled to come off regardless of your appointment time.     Estimated time to finish at 2pm.   If the display on your pump reads "Low Volume" and it is beeping, take the batteries out of the pump and come to the cancer center for it to be taken off.   If the pump alarms go off prior to the pump reading "Low Volume" then call [phone] and someone can assist you.  If the plunger comes out and the chemotherapy medication is leaking out, please use your home chemo spill kit to clean up the spill. Do NOT use paper towels or other household products.  If you have problems or questions regarding your pump, please call either [phone] (24 hours a day) or the cancer center Monday-Friday 8:00 a.m.- 4:30 p.m. at the clinic number and we will assist you. If you are unable to get assistance, then go to the nearest Emergency Department and ask the staff to contact the IV team for assistance.

## 2024-07-16 ENCOUNTER — Inpatient Hospital Stay

## 2024-07-16 VITALS — BP 169/84 | HR 87 | Temp 98.1°F | Resp 18 | Ht 69.0 in | Wt 175.0 lb

## 2024-07-16 DIAGNOSIS — C20 Malignant neoplasm of rectum: Secondary | ICD-10-CM

## 2024-07-16 NOTE — Patient Instructions (Signed)
 Central Line in Adults: What to Expect A central line is a soft tube called a catheter that is put into a vein in the neck, chest, arm or groin. The tip of the central line ends in a large vein just above the heart called the vena cava. A central line may be used to: Give medicines or fluids through an IV for a long time. Give you nutrients. Take blood or give you blood for testing or treatments. Give chemotherapy or dialysis. Provide IV access if veins in your hands or arms are difficult to use. Types of central lines There are four main types of central lines: Peripherally inserted central catheter (PICC) line. A PICC line goes up the upper arm to the vena cava. This is used for access of one week or longer. It can be used to draw blood, give fluids or medicines. Tunneled central line. It's placed in a large vein in the neck, chest, or groin. It's put in through a small cut is made over the vein, and then it's advanced to the vena cava. This is used for long-term therapy and dialysis. It's tunneled under the skin and brought out through a second cut. Non-tunneled central line. This type is put in the neck, chest or groin. It's usually used for 7 days at the most. It's often used in the emergency department. Implanted port. It's usually put in the upper chest, but it can also be placed in the upper arm or the belly. This is used for long-term therapy. It can stay in place longer than other types of central lines. It's put in and taken out with surgery, and it's accessed using a needle. The type of central line you get depends on how long you will need it, your medical condition, and the condition of your veins. Tell a health care provider about: Any allergies you have. All medicines you take. These include vitamins, herbs, eye drops, and creams. Any problems you or family members have had with anesthesia. Any bleeding problems you have. Any surgeries you've had. Any medical conditions  you have. Whether you're pregnant or may be pregnant. What are the risks? Your health care provider will talk with you about risks. These may include: Infection. A blood clot that blocks the central line or forms in the vein and travels to the heart. Bleeding. Getting a hole or crack in the central line. If this happens, the central line will need to be replaced. The catheter moving or coming out of place. A collapsed lung. Damage to other structures or organs. What happens before the procedure? Medicines Ask about changing or stopping: Any medicines you take. Any vitamins, herbs, or supplements you take. Do not take aspirin or ibuprofen unless you're told to. Surgery safety For your safety, you may: Need to wash your skin with a soap that kills germs. Get antibiotics. Have your procedure site marked. Have hair removed at the procedure site. General instructions Eat and drink as told. Ask if you'll be staying overnight in the hospital. If you'll be going home right after the procedure, plan to have a responsible adult: Drive you home from the hospital or clinic. You won't be allowed to drive. Stay with you for the time you're told. What happens during the procedure? An IV will be put into one of your veins. You may be given: A sedative to help you relax. Anesthesia to keep you from feeling pain. Your skin will be cleaned with a soap that kills germs, and  you may be covered with a clean sheet called a drape. Your blood pressure, heart rate, breathing rate, and blood oxygen level will be checked during the procedure. The central line catheter will be put into the vein and moved through to the correct spot. The provider may use X-rays to make sure the catheter goes to the right place. A bandage will be placed over the insertion area. These steps may vary. Ask what you can expect. What can I expect after the procedure? You will watched closely until you leave. This includes  checking your pain level, blood pressure, heart rate, and breathing rate. Caps may be put on the ends of the central line tubes. If you were given a sedative, do not drive or use machines until you're told it's safe. A sedative can make you sleepy. Follow these instructions at home: Flushing and cleaning the central line  Follow instructions from your provider about flushing and cleaning the central line and the area around it. Only use germ-free, also called sterile, supplies to flush the central line. Use supplies recommended by your provider. Before you flush the central line or clean the central line or the area around it: Wash your hands with soap and water for at least 20 seconds. If you can't use soap and water, use hand sanitizer. Clean the central line hub with rubbing alcohol. To do this: Clean the central line hub with a new alcohol wipe. Scrub it using a twisting motion and rub for 10 to 15 seconds or for 30 twists. Follow the manufacturer's instructions. Be sure you scrub the top of the hub, not just the sides. Let the hub dry before use. Prevent it from touching anything while drying. Do not re-use alcohol pads. Caring for your skin Check your central line site every day for signs of infection. Check for: Redness, swelling, or pain. Fluid or blood. Warmth. Pus or a bad smell. Keep the insertion site of your central line clean and dry at all times. Change your bandage only as told by your provider. Keep your bandage dry. If it gets wet, have it changed as soon as possible. Storage and throwing away supplies Keep your supplies in a clean, dry location. Throw away any used syringes in a disposal container that is meant for sharp items, called a sharps container. You can buy a sharps container from a pharmacy, or you can make one by using an empty hard plastic bottle with a cover. Place any used bandages or infusion bags into a plastic bag. Throw that bag in the trash. General  instructions Follow instructions from your provider for the type of device that you have. Keep the tube clamped unless it's being used. If you or someone else accidentally pulls on the tube, make sure: The bandage is OK. There is no bleeding. The tube has not been pulled out. Do not use scissors or sharp objects near the tube. Do not take baths, swim, or use a hot tub until you're told it's OK. Ask if you can shower. Ask what things are safe for you to do. Your provider may tell you not to lift anything or move your arm too much on the side of your central line. Contact a health care provider if: You have signs of an infection where the tube was put in. The skin is irritated near the bandage. You catheter appears to be getting longer. This may mean it's coming out of the vein. Get help right away if: You have:  A fever or chills. Shortness of breath. Pain in your chest. A fast heartbeat. Swelling in your neck, face, chest, or arm on the side of your central line. You feel dizzy or you faint. Your cut or central line site has red streaks spreading away from the area. Your cut or central line site is bleeding and won't stop. Your central line is hard to flush or will not flush or you do not get a blood return. Your central line gets loose, damaged or comes out. Your catheter leaks when flushed or when fluids are infused into it. This information is not intended to replace advice given to you by your health care provider. Make sure you discuss any questions you have with your health care provider. Document Revised: 02/25/2023 Document Reviewed: 02/25/2023 Elsevier Patient Education  2024 [employer].

## 2024-07-21 ENCOUNTER — Other Ambulatory Visit: Payer: Self-pay | Admitting: Family Medicine

## 2024-07-23 ENCOUNTER — Encounter: Payer: Self-pay | Admitting: Oncology

## 2024-07-23 ENCOUNTER — Inpatient Hospital Stay (HOSPITAL_BASED_OUTPATIENT_CLINIC_OR_DEPARTMENT_OTHER): Admission: RE | Admit: 2024-07-23 | Source: Ambulatory Visit | Admitting: Radiology

## 2024-07-23 ENCOUNTER — Other Ambulatory Visit: Payer: Self-pay | Admitting: Oncology

## 2024-07-23 DIAGNOSIS — C787 Secondary malignant neoplasm of liver and intrahepatic bile duct: Secondary | ICD-10-CM

## 2024-07-27 NOTE — Progress Notes (Unsigned)
 [facility]  [address] [place],  [place]  [address] [phone]  Clinic Day:  07/28/2024  Referring physician: Sherre Clapper, MD   HISTORY OF PRESENT ILLNESS:  The patient is a 73 y.o. male with  metastatic rectal cancer, including spread of disease to his liver.  He comes in today to be evaluated before heading into his 17th cycle of FOLFOX/Avastin .  He states he tolerated his 16th cycle well.  His peripheral neuropathy has not progressed over time.  His oxaliplatin  dose has already been decreased by 50+%.  He continues to deny having any GI issues which concern him for overt signs of disease progression.  VITALS:  Blood pressure 136/68, pulse 66, temperature 97.7 F (36.5 C), temperature source Oral, resp. rate 14, height 5' 9 (1.753 m), weight 173 lb 14.4 oz (78.9 kg), SpO2 100%. Wt Readings from Last 3 Encounters:  07/28/24 173 lb 14.4 oz (78.9 kg)  07/16/24 175 lb (79.4 kg)  07/14/24 169 lb 8 oz (76.9 kg)   Body mass index is 25.68 kg/m.  Performance status (ECOG): 1 - Symptomatic but completely ambulatory  PHYSICAL EXAM:   Physical Exam Vitals and nursing note reviewed.  Constitutional:      General: He is not in acute distress.    Appearance: Normal appearance. He is normal weight.  HENT:     Head: Normocephalic and atraumatic.     Mouth/Throat:     Mouth: Mucous membranes are moist.     Pharynx: Oropharynx is clear. No oropharyngeal exudate or posterior oropharyngeal erythema.  Eyes:     General: No scleral icterus.    Extraocular Movements: Extraocular movements intact.     Conjunctiva/sclera: Conjunctivae normal.     Pupils: Pupils are equal, round, and reactive to light.  Cardiovascular:     Rate and Rhythm: Normal rate and regular rhythm.     Heart sounds: Normal heart sounds. No murmur heard.    No friction rub. No gallop.  Pulmonary:     Effort: Pulmonary effort is normal.     Breath sounds: Normal breath sounds. No wheezing,  rhonchi or rales.  Abdominal:     General: Bowel sounds are normal. There is no distension.     Palpations: Abdomen is soft. There is no hepatomegaly, splenomegaly or mass.     Tenderness: There is no abdominal tenderness.  Musculoskeletal:        General: Normal range of motion.     Cervical back: Normal range of motion and neck supple. No tenderness.     Right lower leg: No edema.     Left lower leg: No edema.  Lymphadenopathy:     Cervical: No cervical adenopathy.     Upper Body:     Right upper body: No supraclavicular or axillary adenopathy.     Left upper body: No supraclavicular or axillary adenopathy.     Lower Body: No right inguinal adenopathy. No left inguinal adenopathy.  Skin:    General: Skin is warm and dry.     Coloration: Skin is not jaundiced.     Findings: No rash.  Neurological:     Mental Status: He is alert and oriented to person, place, and time.     Cranial Nerves: No cranial nerve deficit.  Psychiatric:        Mood and Affect: Mood normal.        Behavior: Behavior normal.        Thought Content: Thought content normal.  LABS:      Latest Ref Rng & Units 07/28/2024    9:09 AM 07/14/2024   10:12 AM 06/30/2024    9:04 AM  CBC  WBC 4.0 - 10.5 K/uL 4.0  4.9  5.3   Hemoglobin 13.0 - 17.0 g/dL 9.3  9.6  89.9   Hematocrit 39.0 - 52.0 % 29.9  30.1  31.4   Platelets 150 - 400 K/uL 89  105  139       Latest Ref Rng & Units 07/28/2024    9:09 AM 07/14/2024   10:12 AM 06/30/2024    9:04 AM  CMP  Glucose 70 - 99 mg/dL 835  94  838   BUN 8 - 23 mg/dL 17  11  14    Creatinine 0.61 - 1.24 mg/dL 9.11  9.25  9.08   Sodium 135 - 145 mmol/L 137  139  139   Potassium 3.5 - 5.1 mmol/L 4.0  4.0  4.6   Chloride 98 - 111 mmol/L 106  105  104   CO2 22 - 32 mmol/L 23  25  25    Calcium  8.9 - 10.3 mg/dL 8.9  9.3  9.1   Total Protein 6.5 - 8.1 g/dL 6.7  6.7  6.5   Total Bilirubin 0.0 - 1.2 mg/dL 0.5  0.4  0.5   Alkaline Phos 38 - 126 U/L 62  54  51   AST 15 - 41  U/L 28  25  34   ALT 0 - 44 U/L 14  11  11     Lab Results  Component Value Date   CEA 5.27 (H) 07/14/2024   CEA 7.04 (H) 02/06/2024   ASSESSMENT & PLAN:  Assessment/Plan:  A 73 y.o. male with metastatic rectal cancer including spread of his disease to his liver.  He will proceed with his 17th cycle of FOLFOX/Avastin  today.  His oxaliplatin  will remain decreased by 50+%.  If his neuropathy gets even worse, the patient understands his oxaliplatin  will ultimately be discontinued.  In passing, the patient stated that he has had occasional chest pain and dyspnea lately, particularly with climbing stairs.  As there is a history of coronary artery disease, he knows to speak with cardiology about these symptoms and potentially undergo tests to rule out new cardiac problems.  Otherwise, I will see him back in 2 weeks before he heads into his 18th cycle of FOLFOX/Avastin .  The patient understands all the plans discussed today and is in agreement with them.  Ronna Herskowitz DELENA Kerns, MD

## 2024-07-28 ENCOUNTER — Inpatient Hospital Stay

## 2024-07-28 ENCOUNTER — Inpatient Hospital Stay: Admitting: Oncology

## 2024-07-28 VITALS — BP 136/68 | HR 66 | Temp 97.7°F | Resp 14 | Ht 69.0 in | Wt 173.9 lb

## 2024-07-28 DIAGNOSIS — C787 Secondary malignant neoplasm of liver and intrahepatic bile duct: Secondary | ICD-10-CM

## 2024-07-28 DIAGNOSIS — Z452 Encounter for adjustment and management of vascular access device: Secondary | ICD-10-CM

## 2024-07-28 DIAGNOSIS — G4700 Insomnia, unspecified: Secondary | ICD-10-CM | POA: Insufficient documentation

## 2024-07-28 DIAGNOSIS — I10 Essential (primary) hypertension: Secondary | ICD-10-CM | POA: Insufficient documentation

## 2024-07-28 DIAGNOSIS — R112 Nausea with vomiting, unspecified: Secondary | ICD-10-CM | POA: Insufficient documentation

## 2024-07-28 DIAGNOSIS — R0609 Other forms of dyspnea: Secondary | ICD-10-CM

## 2024-07-28 DIAGNOSIS — I219 Acute myocardial infarction, unspecified: Secondary | ICD-10-CM | POA: Insufficient documentation

## 2024-07-28 DIAGNOSIS — M1990 Unspecified osteoarthritis, unspecified site: Secondary | ICD-10-CM | POA: Insufficient documentation

## 2024-07-28 DIAGNOSIS — E119 Type 2 diabetes mellitus without complications: Secondary | ICD-10-CM | POA: Insufficient documentation

## 2024-07-28 LAB — CBC WITH DIFFERENTIAL (CANCER CENTER ONLY)
Abs Immature Granulocytes: 0.01 K/uL (ref 0.00–0.07)
Basophils Absolute: 0 K/uL (ref 0.0–0.1)
Basophils Relative: 0 %
Eosinophils Absolute: 0.2 K/uL (ref 0.0–0.5)
Eosinophils Relative: 4 %
HCT: 29.9 % — ABNORMAL LOW (ref 39.0–52.0)
Hemoglobin: 9.3 g/dL — ABNORMAL LOW (ref 13.0–17.0)
Immature Granulocytes: 0 %
Lymphocytes Relative: 15 %
Lymphs Abs: 0.6 K/uL — ABNORMAL LOW (ref 0.7–4.0)
MCH: 30.4 pg (ref 26.0–34.0)
MCHC: 31.1 g/dL (ref 30.0–36.0)
MCV: 97.7 fL (ref 80.0–100.0)
Monocytes Absolute: 0.5 K/uL (ref 0.1–1.0)
Monocytes Relative: 13 %
Neutro Abs: 2.7 K/uL (ref 1.7–7.7)
Neutrophils Relative %: 68 %
Platelet Count: 89 K/uL — ABNORMAL LOW (ref 150–400)
RBC: 3.06 MIL/uL — ABNORMAL LOW (ref 4.22–5.81)
RDW: 16.5 % — ABNORMAL HIGH (ref 11.5–15.5)
WBC Count: 4 K/uL (ref 4.0–10.5)
nRBC: 0 % (ref 0.0–0.2)

## 2024-07-28 LAB — CMP (CANCER CENTER ONLY)
ALT: 14 U/L (ref 0–44)
AST: 28 U/L (ref 15–41)
Albumin: 3.6 g/dL (ref 3.5–5.0)
Alkaline Phosphatase: 62 U/L (ref 38–126)
Anion gap: 8 (ref 5–15)
BUN: 17 mg/dL (ref 8–23)
CO2: 23 mmol/L (ref 22–32)
Calcium: 8.9 mg/dL (ref 8.9–10.3)
Chloride: 106 mmol/L (ref 98–111)
Creatinine: 0.88 mg/dL (ref 0.61–1.24)
GFR, Estimated: >60 mL/min (ref >60)
Glucose, Bld: 164 mg/dL — ABNORMAL HIGH (ref 70–99)
Potassium: 4 mmol/L (ref 3.5–5.1)
Sodium: 137 mmol/L (ref 135–145)
Total Bilirubin: 0.5 mg/dL (ref 0.0–1.2)
Total Protein: 6.7 g/dL (ref 6.5–8.1)

## 2024-07-28 LAB — TOTAL PROTEIN, URINE DIPSTICK: Protein, ur: NEGATIVE mg/dL

## 2024-07-28 MED ORDER — EPOETIN ALFA-EPBX 20000 UNIT/ML IJ SOLN
20000.0000 [IU] | Freq: Once | INTRAMUSCULAR | Status: AC
  Administered 2024-07-28: 15:00:00 20000 [IU] via SUBCUTANEOUS
  Filled 2024-07-28: qty 1

## 2024-07-28 MED ORDER — DEXAMETHASONE SOD PHOSPHATE PF 10 MG/ML IJ SOLN
10.0000 mg | Freq: Once | INTRAMUSCULAR | Status: AC
  Administered 2024-07-28: 11:00:00 10 mg via INTRAVENOUS

## 2024-07-28 MED ORDER — DEXTROSE 5 % IV SOLN: INTRAVENOUS | Status: DC

## 2024-07-28 MED ORDER — PALONOSETRON HCL INJECTION 0.25 MG/5ML
0.2500 mg | Freq: Once | INTRAVENOUS | Status: AC
  Administered 2024-07-28: 11:00:00 0.25 mg via INTRAVENOUS
  Filled 2024-07-28: qty 5

## 2024-07-28 MED ORDER — OXALIPLATIN CHEMO INJECTION 100 MG/20ML
35.0000 mg/m2 | Freq: Once | INTRAVENOUS | Status: AC
  Administered 2024-07-28: 12:00:00 70 mg via INTRAVENOUS
  Filled 2024-07-28: qty 14

## 2024-07-28 MED ORDER — SODIUM CHLORIDE 0.9 % IV SOLN
150.0000 mg | Freq: Once | INTRAVENOUS | Status: AC
  Administered 2024-07-28: 11:00:00 150 mg via INTRAVENOUS
  Filled 2024-07-28: qty 150

## 2024-07-28 MED ORDER — SODIUM CHLORIDE 0.9 % IV SOLN
5.0000 mg/kg | Freq: Once | INTRAVENOUS | Status: AC
  Administered 2024-07-28: 12:00:00 400 mg via INTRAVENOUS
  Filled 2024-07-28: qty 16

## 2024-07-28 MED ORDER — DIPHENHYDRAMINE HCL 50 MG/ML IJ SOLN
25.0000 mg | Freq: Once | INTRAMUSCULAR | Status: AC
  Administered 2024-07-28: 11:00:00 25 mg via INTRAVENOUS
  Filled 2024-07-28: qty 1

## 2024-07-28 MED ORDER — SODIUM CHLORIDE 0.9 % IV SOLN: INTRAVENOUS | Status: DC

## 2024-07-28 MED ORDER — SODIUM CHLORIDE 0.9 % IV SOLN
1920.0000 mg/m2 | INTRAVENOUS | Status: DC
  Administered 2024-07-28: 15:00:00 3900 mg via INTRAVENOUS
  Filled 2024-07-28: qty 78

## 2024-07-28 MED ORDER — FLUOROURACIL CHEMO INJECTION 2.5 GM/50ML
320.0000 mg/m2 | Freq: Once | INTRAVENOUS | Status: AC
  Administered 2024-07-28: 15:00:00 650 mg via INTRAVENOUS
  Filled 2024-07-28: qty 13

## 2024-07-28 MED ORDER — FAMOTIDINE IN NACL 20-0.9 MG/50ML-% IV SOLN
20.0000 mg | Freq: Once | INTRAVENOUS | Status: AC
  Administered 2024-07-28: 11:00:00 20 mg via INTRAVENOUS
  Filled 2024-07-28: qty 50

## 2024-07-28 MED ORDER — LEUCOVORIN CALCIUM INJECTION 350 MG
320.0000 mg/m2 | Freq: Once | INTRAVENOUS | Status: AC
  Administered 2024-07-28: 12:00:00 652 mg via INTRAVENOUS
  Filled 2024-07-28: qty 32.6

## 2024-07-28 NOTE — Patient Instructions (Signed)
 Fluorouracil Injection What is this medication? FLUOROURACIL (flure oh YOOR a sil) treats some types of cancer. It works by slowing down the growth of cancer cells. This medicine may be used for other purposes; ask your health care provider or pharmacist if you have questions. COMMON BRAND NAME(S): Adrucil What should I tell my care team before I take this medication? They need to know if you have any of these conditions: Blood disorders Dihydropyrimidine dehydrogenase (DPD) deficiency Infection, such as chickenpox, cold sores, herpes Kidney disease Liver disease Poor nutrition Recent or ongoing radiation therapy An unusual or allergic reaction to fluorouracil, other medications, foods, dyes, or preservatives If you or your partner are pregnant or trying to get pregnant Breast-feeding How should I use this medication? This medication is injected into a vein. It is administered by your care team in a hospital or clinic setting. Talk to your care team about the use of this medication in children. Special care may be needed. Overdosage: If you think you have taken too much of this medicine contact a poison control center or emergency room at once. NOTE: This medicine is only for you. Do not share this medicine with others. What if I miss a dose? Keep appointments for follow-up doses. It is important not to miss your dose. Call your care team if you are unable to keep an appointment. What may interact with this medication? Do not take this medication with any of the following: Live virus vaccines This medication may also interact with the following: Medications that treat or prevent blood clots, such as warfarin, enoxaparin, dalteparin This list may not describe all possible interactions. Give your health care provider a list of all the medicines, herbs, non-prescription drugs, or dietary supplements you use. Also tell them if you smoke, drink alcohol, or use illegal drugs. Some items may  interact with your medicine. What should I watch for while using this medication? Your condition will be monitored carefully while you are receiving this medication. This medication may make you feel generally unwell. This is not uncommon as chemotherapy can affect healthy cells as well as cancer cells. Report any side effects. Continue your course of treatment even though you feel ill unless your care team tells you to stop. In some cases, you may be given additional medications to help with side effects. Follow all directions for their use. This medication may increase your risk of getting an infection. Call your care team for advice if you get a fever, chills, sore throat, or other symptoms of a cold or flu. Do not treat yourself. Try to avoid being around people who are sick. This medication may increase your risk to bruise or bleed. Call your care team if you notice any unusual bleeding. Be careful brushing or flossing your teeth or using a toothpick because you may get an infection or bleed more easily. If you have any dental work done, tell your dentist you are receiving this medication. Avoid taking medications that contain aspirin, acetaminophen, ibuprofen, naproxen, or ketoprofen unless instructed by your care team. These medications may hide a fever. Do not treat diarrhea with over the counter products. Contact your care team if you have diarrhea that lasts more than 2 days or if it is severe and watery. This medication can make you more sensitive to the sun. Keep out of the sun. If you cannot avoid being in the sun, wear protective clothing and sunscreen. Do not use sun lamps, tanning beds, or tanning booths. Talk to  your care team if you or your partner wish to become pregnant or think you might be pregnant. This medication can cause serious birth defects if taken during pregnancy and for 3 months after the last dose. A reliable form of contraception is recommended while taking this  medication and for 3 months after the last dose. Talk to your care team about effective forms of contraception. Do not father a child while taking this medication and for 3 months after the last dose. Use a condom while having sex during this time period. Do not breastfeed while taking this medication. This medication may cause infertility. Talk to your care team if you are concerned about your fertility. What side effects may I notice from receiving this medication? Side effects that you should report to your care team as soon as possible: Allergic reactions--skin rash, itching, hives, swelling of the face, lips, tongue, or throat Heart attack--pain or tightness in the chest, shoulders, arms, or jaw, nausea, shortness of breath, cold or clammy skin, feeling faint or lightheaded Heart failure--shortness of breath, swelling of the ankles, feet, or hands, sudden weight gain, unusual weakness or fatigue Heart rhythm changes--fast or irregular heartbeat, dizziness, feeling faint or lightheaded, chest pain, trouble breathing High ammonia level--unusual weakness or fatigue, confusion, loss of appetite, nausea, vomiting, seizures Infection--fever, chills, cough, sore throat, wounds that don't heal, pain or trouble when passing urine, general feeling of discomfort or being unwell Low red blood cell level--unusual weakness or fatigue, dizziness, headache, trouble breathing Pain, tingling, or numbness in the hands or feet, muscle weakness, change in vision, confusion or trouble speaking, loss of balance or coordination, trouble walking, seizures Redness, swelling, and blistering of the skin over hands and feet Severe or prolonged diarrhea Unusual bruising or bleeding Side effects that usually do not require medical attention (report to your care team if they continue or are bothersome): Dry skin Headache Increased tears Nausea Pain, redness, or swelling with sores inside the mouth or throat Sensitivity  to light Vomiting This list may not describe all possible side effects. Call your doctor for medical advice about side effects. You may report side effects to FDA at 1-800-FDA-1088. Where should I keep my medication? This medication is given in a hospital or clinic. It will not be stored at home. NOTE: This sheet is a summary. It may not cover all possible information. If you have questions about this medicine, talk to your doctor, pharmacist, or health care provider.  2024 Elsevier/Gold Standard (2021-12-05 00:00:00)Leucovorin Injection What is this medication? LEUCOVORIN (loo koe VOR in) prevents side effects from certain medications, such as methotrexate. It works by increasing folate levels. This helps protect healthy cells in your body. It may also be used to treat anemia caused by low levels of folate. It can also be used with fluorouracil, a type of chemotherapy, to treat colorectal cancer. It works by increasing the effects of fluorouracil in the body. This medicine may be used for other purposes; ask your health care provider or pharmacist if you have questions. What should I tell my care team before I take this medication? They need to know if you have any of these conditions: Anemia from low levels of vitamin B12 in the blood An unusual or allergic reaction to leucovorin, folic acid, other medications, foods, dyes, or preservatives Pregnant or trying to get pregnant Breastfeeding How should I use this medication? This medication is injected into a vein or a muscle. It is given by your care team in  a hospital or clinic setting. Talk to your care team about the use of this medication in children. Special care may be needed. Overdosage: If you think you have taken too much of this medicine contact a poison control center or emergency room at once. NOTE: This medicine is only for you. Do not share this medicine with others. What if I miss a dose? Keep appointments for follow-up doses.  It is important not to miss your dose. Call your care team if you are unable to keep an appointment. What may interact with this medication? Capecitabine Fluorouracil Phenobarbital Phenytoin Primidone Trimethoprim;sulfamethoxazole This list may not describe all possible interactions. Give your health care provider a list of all the medicines, herbs, non-prescription drugs, or dietary supplements you use. Also tell them if you smoke, drink alcohol, or use illegal drugs. Some items may interact with your medicine. What should I watch for while using this medication? Your condition will be monitored carefully while you are receiving this medication. This medication may increase the side effects of 5-fluorouracil. Tell your care team if you have diarrhea or mouth sores that do not get better or that get worse. What side effects may I notice from receiving this medication? Side effects that you should report to your care team as soon as possible: Allergic reactions--skin rash, itching, hives, swelling of the face, lips, tongue, or throat This list may not describe all possible side effects. Call your doctor for medical advice about side effects. You may report side effects to FDA at 1-800-FDA-1088. Where should I keep my medication? This medication is given in a hospital or clinic. It will not be stored at home. NOTE: This sheet is a summary. It may not cover all possible information. If you have questions about this medicine, talk to your doctor, pharmacist, or health care provider.  2024 Elsevier/Gold Standard (2022-01-02 00:00:00)Oxaliplatin Injection What is this medication? OXALIPLATIN (ox AL i PLA tin) treats colorectal cancer. It works by slowing down the growth of cancer cells. This medicine may be used for other purposes; ask your health care provider or pharmacist if you have questions. COMMON BRAND NAME(S): Eloxatin What should I tell my care team before I take this medication? They  need to know if you have any of these conditions: Heart disease History of irregular heartbeat or rhythm Liver disease Low blood cell levels (white cells, red cells, and platelets) Lung or breathing disease, such as asthma Take medications that treat or prevent blood clots Tingling of the fingers, toes, or other nerve disorder An unusual or allergic reaction to oxaliplatin, other medications, foods, dyes, or preservatives If you or your partner are pregnant or trying to get pregnant Breast-feeding How should I use this medication? This medication is injected into a vein. It is given by your care team in a hospital or clinic setting. Talk to your care team about the use of this medication in children. Special care may be needed. Overdosage: If you think you have taken too much of this medicine contact a poison control center or emergency room at once. NOTE: This medicine is only for you. Do not share this medicine with others. What if I miss a dose? Keep appointments for follow-up doses. It is important not to miss a dose. Call your care team if you are unable to keep an appointment. What may interact with this medication? Do not take this medication with any of the following: Cisapride Dronedarone Pimozide Thioridazine This medication may also interact with the following:  Aspirin and aspirin-like medications Certain medications that treat or prevent blood clots, such as warfarin, apixaban, dabigatran, and rivaroxaban Cisplatin Cyclosporine Diuretics Medications for infection, such as acyclovir, adefovir, amphotericin B, bacitracin, cidofovir, foscarnet, ganciclovir, gentamicin, pentamidine, vancomycin NSAIDs, medications for pain and inflammation, such as ibuprofen or naproxen Other medications that cause heart rhythm changes Pamidronate Zoledronic acid This list may not describe all possible interactions. Give your health care provider a list of all the medicines, herbs,  non-prescription drugs, or dietary supplements you use. Also tell them if you smoke, drink alcohol, or use illegal drugs. Some items may interact with your medicine. What should I watch for while using this medication? Your condition will be monitored carefully while you are receiving this medication. You may need blood work while taking this medication. This medication may make you feel generally unwell. This is not uncommon as chemotherapy can affect healthy cells as well as cancer cells. Report any side effects. Continue your course of treatment even though you feel ill unless your care team tells you to stop. This medication may increase your risk of getting an infection. Call your care team for advice if you get a fever, chills, sore throat, or other symptoms of a cold or flu. Do not treat yourself. Try to avoid being around people who are sick. Avoid taking medications that contain aspirin, acetaminophen, ibuprofen, naproxen, or ketoprofen unless instructed by your care team. These medications may hide a fever. Be careful brushing or flossing your teeth or using a toothpick because you may get an infection or bleed more easily. If you have any dental work done, tell your dentist you are receiving this medication. This medication can make you more sensitive to cold. Do not drink cold drinks or use ice. Cover exposed skin before coming in contact with cold temperatures or cold objects. When out in cold weather wear warm clothing and cover your mouth and nose to warm the air that goes into your lungs. Tell your care team if you get sensitive to the cold. Talk to your care team if you or your partner are pregnant or think either of you might be pregnant. This medication can cause serious birth defects if taken during pregnancy and for 9 months after the last dose. A negative pregnancy test is required before starting this medication. A reliable form of contraception is recommended while taking this  medication and for 9 months after the last dose. Talk to your care team about effective forms of contraception. Do not father a child while taking this medication and for 6 months after the last dose. Use a condom while having sex during this time period. Do not breastfeed while taking this medication and for 3 months after the last dose. This medication may cause infertility. Talk to your care team if you are concerned about your fertility. What side effects may I notice from receiving this medication? Side effects that you should report to your care team as soon as possible: Allergic reactions--skin rash, itching, hives, swelling of the face, lips, tongue, or throat Bleeding--bloody or black, tar-like stools, vomiting blood or brown material that looks like coffee grounds, red or dark brown urine, small red or purple spots on skin, unusual bruising or bleeding Dry cough, shortness of breath or trouble breathing Heart rhythm changes--fast or irregular heartbeat, dizziness, feeling faint or lightheaded, chest pain, trouble breathing Infection--fever, chills, cough, sore throat, wounds that don't heal, pain or trouble when passing urine, general feeling of discomfort or being unwell  Liver injury--right upper belly pain, loss of appetite, nausea, light-colored stool, dark yellow or brown urine, yellowing skin or eyes, unusual weakness or fatigue Low red blood cell level--unusual weakness or fatigue, dizziness, headache, trouble breathing Muscle injury--unusual weakness or fatigue, muscle pain, dark yellow or brown urine, decrease in amount of urine Pain, tingling, or numbness in the hands or feet Sudden and severe headache, confusion, change in vision, seizures, which may be signs of posterior reversible encephalopathy syndrome (PRES) Unusual bruising or bleeding Side effects that usually do not require medical attention (report to your care team if they continue or are  bothersome): Diarrhea Nausea Pain, redness, or swelling with sores inside the mouth or throat Unusual weakness or fatigue Vomiting This list may not describe all possible side effects. Call your doctor for medical advice about side effects. You may report side effects to FDA at 1-800-FDA-1088. Where should I keep my medication? This medication is given in a hospital or clinic. It will not be stored at home. NOTE: This sheet is a summary. It may not cover all possible information. If you have questions about this medicine, talk to your doctor, pharmacist, or health care provider.  2024 Elsevier/Gold Standard (2023-07-12 00:00:00)Bevacizumab Injection What is this medication? BEVACIZUMAB (be va SIZ yoo mab) treats some types of cancer. It works by blocking a protein that causes cancer cells to grow and multiply. This helps to slow or stop the spread of cancer cells. It is a monoclonal antibody. This medicine may be used for other purposes; ask your health care provider or pharmacist if you have questions. COMMON BRAND NAME(S): Alymsys, Avastin, MVASI, [place] Lao What should I tell my care team before I take this medication? They need to know if you have any of these conditions: Blood clots Coughing up blood Having or recent surgery Heart failure High blood pressure History of a connection between 2 or more body parts that do not usually connect (fistula) History of a tear in your stomach or intestines Protein in your urine An unusual or allergic reaction to bevacizumab, other medications, foods, dyes, or preservatives Pregnant or trying to get pregnant Breast-feeding How should I use this medication? This medication is injected into a vein. It is given by your care team in a hospital or clinic setting. Talk to your care team the use of this medication in children. Special care may be needed. Overdosage: If you think you have taken too much of this medicine contact a poison  control center or emergency room at once. NOTE: This medicine is only for you. Do not share this medicine with others. What if I miss a dose? Keep appointments for follow-up doses. It is important not to miss your dose. Call your care team if you are unable to keep an appointment. What may interact with this medication? Interactions are not expected. This list may not describe all possible interactions. Give your health care provider a list of all the medicines, herbs, non-prescription drugs, or dietary supplements you use. Also tell them if you smoke, drink alcohol, or use illegal drugs. Some items may interact with your medicine. What should I watch for while using this medication? Your condition will be monitored carefully while you are receiving this medication. You may need blood work while taking this medication. This medication may make you feel generally unwell. This is not uncommon as chemotherapy can affect healthy cells as well as cancer cells. Report any side effects. Continue your course of treatment even though you feel  ill unless your care team tells you to stop. This medication may increase your risk to bruise or bleed. Call your care team if you notice any unusual bleeding. Before having surgery, talk to your care team to make sure it is ok. This medication can increase the risk of poor healing of your surgical site or wound. You will need to stop this medication for 28 days before surgery. After surgery, wait at least 28 days before restarting this medication. Make sure the surgical site or wound is healed enough before restarting this medication. Talk to your care team if questions. Talk to your care team if you may be pregnant. Serious birth defects can occur if you take this medication during pregnancy and for 6 months after the last dose. Contraception is recommended while taking this medication and for 6 months after the last dose. Your care team can help you find the option that  works for you. Do not breastfeed while taking this medication and for 6 months after the last dose. This medication can cause infertility. Talk to your care team if you are concerned about your fertility. What side effects may I notice from receiving this medication? Side effects that you should report to your care team as soon as possible: Allergic reactions--skin rash, itching, hives, swelling of the face, lips, tongue, or throat Bleeding--bloody or black, tar-like stools, vomiting blood or brown material that looks like coffee grounds, red or dark brown urine, small red or purple spots on skin, unusual bruising or bleeding Blood clot--pain, swelling, or warmth in the leg, shortness of breath, chest pain Heart attack--pain or tightness in the chest, shoulders, arms, or jaw, nausea, shortness of breath, cold or clammy skin, feeling faint or lightheaded Heart failure--shortness of breath, swelling of the ankles, feet, or hands, sudden weight gain, unusual weakness or fatigue Increase in blood pressure Infection--fever, chills, cough, sore throat, wounds that don't heal, pain or trouble when passing urine, general feeling of discomfort or being unwell Infusion reactions--chest pain, shortness of breath or trouble breathing, feeling faint or lightheaded Kidney injury--decrease in the amount of urine, swelling of the ankles, hands, or feet Stomach pain that is severe, does not go away, or gets worse Stroke--sudden numbness or weakness of the face, arm, or leg, trouble speaking, confusion, trouble walking, loss of balance or coordination, dizziness, severe headache, change in vision Sudden and severe headache, confusion, change in vision, seizures, which may be signs of posterior reversible encephalopathy syndrome (PRES) Side effects that usually do not require medical attention (report to your care team if they continue or are bothersome): Back pain Change in taste Diarrhea Dry skin Increased  tears Nosebleed This list may not describe all possible side effects. Call your doctor for medical advice about side effects. You may report side effects to FDA at 1-800-FDA-1088. Where should I keep my medication? This medication is given in a hospital or clinic. It will not be stored at home. NOTE: This sheet is a summary. It may not cover all possible information. If you have questions about this medicine, talk to your doctor, pharmacist, or health care provider.  2024 Elsevier/Gold Standard (2021-12-15 00:00:00)

## 2024-07-29 ENCOUNTER — Ambulatory Visit: Attending: Cardiology | Admitting: Cardiology

## 2024-07-30 ENCOUNTER — Inpatient Hospital Stay

## 2024-07-30 ENCOUNTER — Encounter: Payer: Self-pay | Admitting: Oncology

## 2024-07-30 DIAGNOSIS — I340 Nonrheumatic mitral (valve) insufficiency: Secondary | ICD-10-CM | POA: Diagnosis not present

## 2024-07-30 DIAGNOSIS — I517 Cardiomegaly: Secondary | ICD-10-CM | POA: Diagnosis not present

## 2024-07-30 DIAGNOSIS — I10 Essential (primary) hypertension: Secondary | ICD-10-CM | POA: Diagnosis not present

## 2024-07-30 DIAGNOSIS — I361 Nonrheumatic tricuspid (valve) insufficiency: Secondary | ICD-10-CM | POA: Diagnosis not present

## 2024-07-30 DIAGNOSIS — I214 Non-ST elevation (NSTEMI) myocardial infarction: Secondary | ICD-10-CM | POA: Diagnosis not present

## 2024-07-30 DIAGNOSIS — C20 Malignant neoplasm of rectum: Secondary | ICD-10-CM | POA: Diagnosis not present

## 2024-07-31 ENCOUNTER — Other Ambulatory Visit: Payer: Self-pay

## 2024-07-31 ENCOUNTER — Encounter (HOSPITAL_COMMUNITY): Payer: Self-pay | Admitting: Internal Medicine

## 2024-07-31 ENCOUNTER — Encounter (HOSPITAL_COMMUNITY): Payer: Self-pay

## 2024-07-31 ENCOUNTER — Inpatient Hospital Stay (HOSPITAL_COMMUNITY)
Admission: EM | Admit: 2024-07-31 | Discharge: 2024-08-04 | DRG: 321 | Disposition: A | Discharge status: Home / Self Care | Source: Other Acute Inpatient Hospital | Attending: Internal Medicine | Admitting: Internal Medicine

## 2024-07-31 DIAGNOSIS — D6959 Other secondary thrombocytopenia: Secondary | ICD-10-CM | POA: Diagnosis present

## 2024-07-31 DIAGNOSIS — E872 Acidosis, unspecified: Secondary | ICD-10-CM | POA: Diagnosis present

## 2024-07-31 DIAGNOSIS — G4733 Obstructive sleep apnea (adult) (pediatric): Secondary | ICD-10-CM | POA: Diagnosis present

## 2024-07-31 DIAGNOSIS — J9601 Acute respiratory failure with hypoxia: Secondary | ICD-10-CM

## 2024-07-31 DIAGNOSIS — I214 Non-ST elevation (NSTEMI) myocardial infarction: Secondary | ICD-10-CM | POA: Diagnosis present

## 2024-07-31 DIAGNOSIS — E785 Hyperlipidemia, unspecified: Secondary | ICD-10-CM | POA: Diagnosis not present

## 2024-07-31 DIAGNOSIS — Z7901 Long term (current) use of anticoagulants: Secondary | ICD-10-CM | POA: Diagnosis not present

## 2024-07-31 DIAGNOSIS — I2510 Atherosclerotic heart disease of native coronary artery without angina pectoris: Secondary | ICD-10-CM | POA: Diagnosis present

## 2024-07-31 DIAGNOSIS — Z8546 Personal history of malignant neoplasm of prostate: Secondary | ICD-10-CM

## 2024-07-31 DIAGNOSIS — C20 Malignant neoplasm of rectum: Secondary | ICD-10-CM | POA: Diagnosis present

## 2024-07-31 DIAGNOSIS — Z8249 Family history of ischemic heart disease and other diseases of the circulatory system: Secondary | ICD-10-CM

## 2024-07-31 DIAGNOSIS — I5041 Acute combined systolic (congestive) and diastolic (congestive) heart failure: Secondary | ICD-10-CM | POA: Diagnosis present

## 2024-07-31 DIAGNOSIS — Z83438 Family history of other disorder of lipoprotein metabolism and other lipidemia: Secondary | ICD-10-CM

## 2024-07-31 DIAGNOSIS — I10 Essential (primary) hypertension: Secondary | ICD-10-CM | POA: Diagnosis not present

## 2024-07-31 DIAGNOSIS — I252 Old myocardial infarction: Secondary | ICD-10-CM

## 2024-07-31 DIAGNOSIS — E1142 Type 2 diabetes mellitus with diabetic polyneuropathy: Secondary | ICD-10-CM | POA: Diagnosis present

## 2024-07-31 DIAGNOSIS — N401 Enlarged prostate with lower urinary tract symptoms: Secondary | ICD-10-CM | POA: Diagnosis present

## 2024-07-31 DIAGNOSIS — I255 Ischemic cardiomyopathy: Secondary | ICD-10-CM | POA: Diagnosis present

## 2024-07-31 DIAGNOSIS — Z955 Presence of coronary angioplasty implant and graft: Secondary | ICD-10-CM

## 2024-07-31 DIAGNOSIS — Z818 Family history of other mental and behavioral disorders: Secondary | ICD-10-CM

## 2024-07-31 DIAGNOSIS — J189 Pneumonia, unspecified organism: Secondary | ICD-10-CM | POA: Diagnosis present

## 2024-07-31 DIAGNOSIS — R748 Abnormal levels of other serum enzymes: Secondary | ICD-10-CM

## 2024-07-31 DIAGNOSIS — D649 Anemia, unspecified: Secondary | ICD-10-CM | POA: Diagnosis present

## 2024-07-31 DIAGNOSIS — I110 Hypertensive heart disease with heart failure: Secondary | ICD-10-CM | POA: Diagnosis present

## 2024-07-31 DIAGNOSIS — Z923 Personal history of irradiation: Secondary | ICD-10-CM

## 2024-07-31 DIAGNOSIS — Z7982 Long term (current) use of aspirin: Secondary | ICD-10-CM | POA: Diagnosis not present

## 2024-07-31 DIAGNOSIS — E782 Mixed hyperlipidemia: Secondary | ICD-10-CM | POA: Diagnosis present

## 2024-07-31 DIAGNOSIS — Z743 Need for continuous supervision: Secondary | ICD-10-CM | POA: Diagnosis not present

## 2024-07-31 DIAGNOSIS — Z87891 Personal history of nicotine dependence: Secondary | ICD-10-CM

## 2024-07-31 DIAGNOSIS — I5021 Acute systolic (congestive) heart failure: Secondary | ICD-10-CM | POA: Diagnosis not present

## 2024-07-31 DIAGNOSIS — R531 Weakness: Secondary | ICD-10-CM | POA: Diagnosis not present

## 2024-07-31 DIAGNOSIS — Z79899 Other long term (current) drug therapy: Secondary | ICD-10-CM

## 2024-07-31 DIAGNOSIS — C787 Secondary malignant neoplasm of liver and intrahepatic bile duct: Secondary | ICD-10-CM | POA: Diagnosis present

## 2024-07-31 DIAGNOSIS — I350 Nonrheumatic aortic (valve) stenosis: Secondary | ICD-10-CM | POA: Diagnosis present

## 2024-07-31 DIAGNOSIS — I509 Heart failure, unspecified: Secondary | ICD-10-CM

## 2024-07-31 HISTORY — DX: Abnormal levels of other serum enzymes: R74.8

## 2024-07-31 LAB — HEPARIN LEVEL (UNFRACTIONATED): Heparin Unfractionated: 0.29 [IU]/mL — ABNORMAL LOW (ref 0.30–0.70)

## 2024-07-31 LAB — COMPREHENSIVE METABOLIC PANEL WITH GFR
ALT: 12 U/L (ref 0–44)
AST: 30 U/L (ref 15–41)
Albumin: 3.1 g/dL — ABNORMAL LOW (ref 3.5–5.0)
Alkaline Phosphatase: 50 U/L (ref 38–126)
Anion gap: 6 (ref 5–15)
BUN: 17 mg/dL (ref 8–23)
CO2: 22 mmol/L (ref 22–32)
Calcium: 8.6 mg/dL — ABNORMAL LOW (ref 8.9–10.3)
Chloride: 109 mmol/L (ref 98–111)
Creatinine, Ser: 0.79 mg/dL (ref 0.61–1.24)
GFR, Estimated: >60 mL/min
Glucose, Bld: 105 mg/dL — ABNORMAL HIGH (ref 70–99)
Potassium: 4.8 mmol/L (ref 3.5–5.1)
Sodium: 137 mmol/L (ref 135–145)
Total Bilirubin: 0.4 mg/dL (ref 0.0–1.2)
Total Protein: 5.8 g/dL — ABNORMAL LOW (ref 6.5–8.1)

## 2024-07-31 LAB — CBC
HCT: 29.3 % — ABNORMAL LOW (ref 39.0–52.0)
Hemoglobin: 9 g/dL — ABNORMAL LOW (ref 13.0–17.0)
MCH: 30.7 pg (ref 26.0–34.0)
MCHC: 30.7 g/dL (ref 30.0–36.0)
MCV: 100 fL (ref 80.0–100.0)
Platelets: 101 K/uL — ABNORMAL LOW (ref 150–400)
RBC: 2.93 MIL/uL — ABNORMAL LOW (ref 4.22–5.81)
RDW: 16.4 % — ABNORMAL HIGH (ref 11.5–15.5)
WBC: 3.3 K/uL — ABNORMAL LOW (ref 4.0–10.5)
nRBC: 0 % (ref 0.0–0.2)

## 2024-07-31 LAB — PRO BRAIN NATRIURETIC PEPTIDE: Pro Brain Natriuretic Peptide: 5785 pg/mL — ABNORMAL HIGH

## 2024-07-31 LAB — TROPONIN T, HIGH SENSITIVITY: Troponin T High Sensitivity: 728 ng/L (ref 0–19)

## 2024-07-31 LAB — LIPASE, BLOOD: Lipase: 55 U/L — ABNORMAL HIGH (ref 11–51)

## 2024-07-31 MED ORDER — EZETIMIBE 10 MG PO TABS
10.0000 mg | ORAL_TABLET | Freq: Every day | ORAL | Status: DC
  Administered 2024-08-01 – 2024-08-04 (×4): 10 mg via ORAL
  Filled 2024-07-31 (×4): qty 1

## 2024-07-31 MED ORDER — METOPROLOL SUCCINATE ER 25 MG PO TB24: 25.0000 mg | ORAL_TABLET | Freq: Every morning | ORAL | Status: DC

## 2024-07-31 MED ORDER — GABAPENTIN 300 MG PO CAPS
300.0000 mg | ORAL_CAPSULE | Freq: Three times a day (TID) | ORAL | Status: DC
  Administered 2024-08-01 – 2024-08-04 (×9): 300 mg via ORAL
  Filled 2024-07-31 (×10): qty 1

## 2024-07-31 MED ORDER — ACETAMINOPHEN 325 MG PO TABS
650.0000 mg | ORAL_TABLET | Freq: Four times a day (QID) | ORAL | Status: DC | PRN
  Administered 2024-08-04: 650 mg via ORAL
  Filled 2024-07-31: qty 2

## 2024-07-31 MED ORDER — HEPARIN (PORCINE) 25000 UT/250ML-% IV SOLN
1800.0000 [IU]/h | INTRAVENOUS | Status: DC
  Administered 2024-07-31 – 2024-08-01 (×2): 1700 [IU]/h via INTRAVENOUS
  Administered 2024-08-01: 1800 [IU]/h via INTRAVENOUS
  Filled 2024-07-31 (×2): qty 250

## 2024-07-31 MED ORDER — LISINOPRIL 10 MG PO TABS
10.0000 mg | ORAL_TABLET | Freq: Every day | ORAL | Status: DC
  Administered 2024-08-01: 10 mg via ORAL
  Filled 2024-07-31: qty 1

## 2024-07-31 MED ORDER — SODIUM CHLORIDE 0.9 % IV SOLN
2.0000 g | Freq: Three times a day (TID) | INTRAVENOUS | Status: AC
  Administered 2024-07-31 – 2024-08-02 (×7): 2 g via INTRAVENOUS
  Filled 2024-07-31 (×7): qty 12.5

## 2024-07-31 MED ORDER — ROSUVASTATIN CALCIUM 20 MG PO TABS
40.0000 mg | ORAL_TABLET | Freq: Every day | ORAL | Status: DC
  Administered 2024-08-01 – 2024-08-03 (×3): 40 mg via ORAL
  Filled 2024-07-31 (×3): qty 2

## 2024-07-31 MED ORDER — INSULIN ASPART 100 UNIT/ML IJ SOLN: 0.0000 [IU] | INTRAMUSCULAR | Status: DC

## 2024-07-31 MED ORDER — ACETAMINOPHEN 650 MG RE SUPP: 650.0000 mg | Freq: Four times a day (QID) | RECTAL | Status: DC | PRN

## 2024-07-31 MED ORDER — SODIUM CHLORIDE 0.9 % IV SOLN
500.0000 mg | INTRAVENOUS | Status: AC
  Administered 2024-08-01 – 2024-08-02 (×2): 500 mg via INTRAVENOUS
  Filled 2024-07-31 (×2): qty 5

## 2024-07-31 MED ORDER — ASPIRIN 81 MG PO TBEC
81.0000 mg | DELAYED_RELEASE_TABLET | Freq: Every day | ORAL | Status: DC
  Administered 2024-08-01 – 2024-08-04 (×3): 81 mg via ORAL
  Filled 2024-07-31 (×4): qty 1

## 2024-07-31 NOTE — Progress Notes (Signed)
 PHARMACY - ANTICOAGULATION CONSULT NOTE  Pharmacy Consult for Heparin  Indication: chest pain/ACS  Allergies[1]  Patient Measurements:    Vital Signs: Temp: 97.7 F (36.5 C) (12/19 1947) Temp Source: Oral (12/19 1947) BP: 143/77 (12/19 1947) Pulse Rate: 85 (12/19 1947)  Labs: No results for input(s): HGB, HCT, PLT, APTT, LABPROT, INR, HEPARINUNFRC, HEPRLOWMOCWT, CREATININE, CKTOTAL, CKMB, TROPONINIHS in the last 72 hours.  Estimated Creatinine Clearance: 77 mL/min (by C-G formula based on SCr of 0.88 mg/dL).   Medical History: Past Medical History:  Diagnosis Date   Arthritis    BMI 27.0-27.9,adult 06/09/2023   BPH with obstruction/lower urinary tract symptoms 08/01/2021   Chronic left shoulder pain 11/30/2019   Diabetes mellitus without complication (HCC)    Diabetic polyneuropathy associated with type 2 diabetes mellitus (HCC) 11/30/2019   Encounter for central line care 01/02/2024   Encounter for immunization 06/09/2023   Hypertension    Hypertensive heart disease without heart failure 11/30/2019   Insomnia    Mixed hyperlipidemia 11/30/2019   Myocardial infarct (HCC)    Obstructive sleep apnea 04/03/2018   PONV (postoperative nausea and vomiting)    Prostate cancer (HCC) 07/2021   Rectal bleeding 09/21/2023   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) 10/30/2023   Urge incontinence 07/12/2022   Vertigo 01/19/2024    Medications:  Scheduled:   [START ON 08/01/2024] aspirin EC  81 mg Oral Daily   [START ON 08/01/2024] ezetimibe   10 mg Oral Daily   [START ON 08/01/2024] gabapentin   300 mg Oral TID   [START ON 08/01/2024] insulin  aspart  0-9 Units Subcutaneous Q4H   [START ON 08/01/2024] lisinopril   10 mg Oral Daily   [START ON 08/01/2024] metoprolol  succinate  25 mg Oral q morning   [START ON 08/01/2024] rosuvastatin   40 mg Oral QHS   Infusions:   [START ON 08/01/2024] azithromycin     ceFEPime (MAXIPIME) IV     heparin      PRN:  acetaminophen  **OR** acetaminophen   Assessment: 73 yo male transferred from [facility] for evaluation of chest pain. Patient was started on a heparin  drip on 12/17, current rate is 1600 units/hr. Most recent CBC: Hgb 9.7, Plts 97 (on chemo). Per RN, no signs of bleeding reported.  Goal of Therapy:  Heparin  level 0.3-0.7 units/ml Monitor platelets by anticoagulation protocol: Yes   Plan:  Continue heparin  infusion at 1600 units/hr Check heparin  level now and adjust rate as needed Daily heparin  level and CBC Monitor for signs/symptoms of bleeding  Rocky Slade, PharmD, BCPS 07/31/2024,9:40 PM  Please check AMION for all [facility] Pharmacy phone numbers After 10:00 PM, call Main Pharmacy [phone]       [1] No Known Allergies

## 2024-07-31 NOTE — Consult Note (Signed)
 "  Cardiology Consultation   Patient ID: Joshua Nelson MRN: [id number]; DOB: [birth year]  Admit date: 07/31/2024 Date of Consult: 07/31/2024  PCP:  Sherre Clapper, MD   [facility] Providers Cardiologist:  [facility] in 2023 None        Patient Profile: Joshua Nelson is a 73 y.o. male with a hx of CAD s/p prior MI and stent placement in 2011 and 2012, HTN, HLD, OSA, rectal cancer with liver mets on chemotherapy who is being seen 07/31/2024 for the evaluation of NSTEMI at the request of Dr. Alfornia.   History of Present Illness: [name] states that he was going up a flight of stairs at a movie theater some days ago and he had a noticeable moment of sudden onset shortness of breath and chest pressure that concerned him.  This felt like an elephant sitting on his chest.  He initially discussed with his wife and they thought about going home and calling the cardiologist office, however he had persistent chest pressure later on and ultimately presented to [facility].  He did have a fever when he arrived.  He was treated with a heparin  infusion, and laboratory workup did show evidence of chronic anemia which was stable as well as chronic thrombocytopenia which remained stable on heparin  infusion.  His case was discussed with oncology, and they recommended continuing heparin  infusion unless the platelets fall below 50,000.  He has not had any bleeding that he is aware of during this time.  He underwent CTA chest which was negative for PE, notable for ground glass densities in bilateral lungs which was suspicious for pulm and edema versus pneumonia and he has been on antibiotics for presumed CAP.  He denies any significant symptoms but does endorse having had a slight cough however thinks he did have a bit of a viral infection which is clearing up.  Reportedly he had an echocardiogram done with EF 40 to 40% with regional wall abnormalities.   Past Medical History:   Diagnosis Date   Acute CHF (HCC) 07/31/2024   Arthritis    BMI 27.0-27.9,adult 06/09/2023   BPH with obstruction/lower urinary tract symptoms 08/01/2021   Chronic left shoulder pain 11/30/2019   Diabetes mellitus without complication (HCC)    Diabetic polyneuropathy associated with type 2 diabetes mellitus (HCC) 11/30/2019   Encounter for central line care 01/02/2024   Encounter for immunization 06/09/2023   Hypertension    Hypertensive heart disease without heart failure 11/30/2019   Insomnia    Mixed hyperlipidemia 11/30/2019   Myocardial infarct [facility])    Obstructive sleep apnea 04/03/2018   PONV (postoperative nausea and vomiting)    Prostate cancer (HCC) 07/2021   Rectal bleeding 09/21/2023   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) 10/30/2023   Urge incontinence 07/12/2022   Vertigo 01/19/2024    Past Surgical History:  Procedure Laterality Date   CARDIAC CATHETERIZATION     THREE CARDIAC STENTS PLACED   COLONOSCOPY W/ POLYPECTOMY     CYSTOSCOPY WITH URETHRAL DILATATION N/A 08/01/2021   Procedure: CYSTOSCOPY WITH BALLOON URETHRAL DILATATION;  Surgeon: Elisabeth Valli BIRCH, MD;  Location: [facility];  Service: Urology;  Laterality: N/A;   left cataract  10/2019   NOSE SURGERY     RESULT OF DOG BITE DURING CHILDHOOD   PROSTATE BIOPSY     TRANSURETHRAL RESECTION OF BLADDER TUMOR N/A 08/01/2021   Procedure: TRANSURETHRAL RESECTION OF PROSTATE;  Surgeon: Elisabeth Valli BIRCH, MD;  Location: [facility];  Service: Urology;  Laterality: N/A;  90 MINS   URETHRAL STRICTURE DILATATION       Home Medications:  Prior to Admission medications  Medication Sig Start Date End Date Taking? Authorizing Provider  aspirin EC 81 MG tablet Take 81 mg by mouth daily. Swallow whole.   Yes [provider]  dapagliflozin  propanediol (FARXIGA ) 10 MG TABS tablet Take 1 tablet (10 mg total) by mouth daily. 05/18/24  Yes Cox, Kirsten, MD  ezetimibe  (ZETIA ) 10 MG tablet TAKE ONE (1) TABLET ONCE DAILY  01/18/21  Yes Cox, Kirsten, MD  gabapentin  (NEURONTIN ) 300 MG capsule Take 1 capsule (300 mg total) by mouth 3 (three) times daily. 05/24/24  Yes Cox, Kirsten, MD  Glucagon  (GVOKE HYPOPEN  2-PACK) 0.5 MG/0.1ML SOAJ Inject 0.5 mg into the skin daily as needed. 01/14/24  Yes Cox, Kirsten, MD  lisinopril  (ZESTRIL ) 10 MG tablet Take 1 tablet by mouth once daily. 07/21/24  Yes Cox, Kirsten, MD  meclizine  (ANTIVERT ) 25 MG tablet Take 1 tablet (25 mg total) by mouth 3 (three) times daily as needed for dizziness. 01/14/24  Yes Cox, Kirsten, MD  metoprolol  succinate (TOPROL -XL) 25 MG 24 hr tablet TAKE 1 TABLET BY MOUTH ONCE DAILY IN THE MORNING. 12/09/23  Yes Cox, Kirsten, MD  ondansetron  (ZOFRAN ) 8 MG tablet Take 1 tablet (8 mg total) by mouth every 8 (eight) hours as needed for nausea or vomiting. 05/12/24  Yes Harl Setter A, NP  prochlorperazine  (COMPAZINE ) 10 MG tablet Take 1 tablet (10 mg total) by mouth every 6 (six) hours as needed for nausea or vomiting. 12/10/23  Yes Harl Setter A, NP  rosuvastatin  (CRESTOR ) 40 MG tablet Take 1 tablet (40 mg total) by mouth at bedtime. 04/26/24  Yes Cox, Kirsten, MD  Continuous Glucose Receiver (FREESTYLE LIBRE READER) DEVI Freestyle libre reader that works with freestyle libre 3+ 04/20/24   Cox, Kirsten, MD  Continuous Glucose Sensor (FREESTYLE LIBRE 3 PLUS SENSOR) MISC Change sensor every 15 days. 04/20/24   Sherre Clapper, MD  OLANZapine  (ZYPREXA ) 5 MG tablet One tab at bedtime prn nauese 01/28/24   Ezzard Valaria LABOR, MD    Scheduled Meds:  [START ON 08/01/2024] aspirin EC  81 mg Oral Daily   [START ON 08/01/2024] ezetimibe   10 mg Oral Daily   [START ON 08/01/2024] gabapentin   300 mg Oral TID   [START ON 08/01/2024] insulin  aspart  0-9 Units Subcutaneous Q4H   [START ON 08/01/2024] lisinopril   10 mg Oral Daily   [START ON 08/01/2024] metoprolol  succinate  25 mg Oral q morning   [START ON 08/01/2024] rosuvastatin   40 mg Oral QHS   Continuous Infusions:  [START ON  08/01/2024] azithromycin     ceFEPime (MAXIPIME) IV     heparin      PRN Meds: acetaminophen  **OR** acetaminophen   Allergies:   Allergies[1]  Social History:   Social History   Socioeconomic History   Marital status: Married    Spouse name: Nathanel   Number of children: 1   Years of education: 12   Highest education level: 12th grade  Occupational History   Occupation: RETIRED - TRUCK DRIVER  Tobacco Use   Smoking status: Former    Current packs/day: 0.00    Types: Cigarettes    Quit date: 2003    Years since quitting: 22.9   Smokeless tobacco: Never  Vaping Use   Vaping status: Never Used  Substance and Sexual Activity   Alcohol use: Not Currently    Comment: none since 2000  Drug use: Never   Sexual activity: Not Currently  Other Topics Concern   Not on file  Social History Narrative   Lives at home with his wife.   2 cups caffeine per day.   Right-handed.   Social Drivers of Health   Tobacco Use: Medium Risk (07/13/2024)   Patient History    Smoking Tobacco Use: Former    Smokeless Tobacco Use: Never    Passive Exposure: Not on file  Financial Resource Strain: Low Risk (04/30/2024)   Overall Financial Resource Strain (CARDIA)    Difficulty of Paying Living Expenses: Not hard at all  Food Insecurity: No Food Insecurity (04/30/2024)   Epic    Worried About Programme Researcher, Broadcasting/film/video in the Last Year: Never true    Ran Out of Food in the Last Year: Never true  Transportation Needs: No Transportation Needs (04/30/2024)   Epic    Lack of Transportation (Medical): No    Lack of Transportation (Non-Medical): No  Physical Activity: Inactive (04/30/2024)   Exercise Vital Sign    Days of Exercise per Week: 0 days    Minutes of Exercise per Session: 0 min  Stress: No Stress Concern Present (04/30/2024)   [employer] of Occupational Health - Occupational Stress Questionnaire    Feeling of Stress: Only a little  Social Connections: Socially Integrated (04/30/2024)    Social Connection and Isolation Panel    Frequency of Communication with Friends and Family: More than three times a week    Frequency of Social Gatherings with Friends and Family: More than three times a week    Attends Religious Services: More than 4 times per year    Active Member of Clubs or Organizations: Yes    Attends Banker Meetings: More than 4 times per year    Marital Status: Married  Catering Manager Violence: Not At Risk (04/30/2024)   Epic    Fear of Current or Ex-Partner: No    Emotionally Abused: No    Physically Abused: No    Sexually Abused: No  Depression (PHQ2-9): Low Risk (06/30/2024)   Depression (PHQ2-9)    PHQ-2 Score: 0  Alcohol Screen: Low Risk (04/30/2024)   Alcohol Screen    Last Alcohol Screening Score (AUDIT): 0  Housing: Unknown (04/30/2024)   Epic    Unable to Pay for Housing in the Last Year: No    Number of Times Moved in the Last Year: Not on file    Homeless in the Last Year: No  Utilities: Not At Risk (04/30/2024)   Epic    Threatened with loss of utilities: No  Health Literacy: Adequate Health Literacy (04/30/2024)   B1300 Health Literacy    Frequency of need for help with medical instructions: Never    Family History:    Family History  Problem Relation Age of Onset   Endometrial cancer Mother    CAD Mother    AAA (abdominal aortic aneurysm) Mother    Dementia Father    Anxiety disorder Father    Depression Father    Heart disease Father    Hypertension Father    Deep vein thrombosis Father    Kidney Stones Sister        H/O : INTESTINAL BLOCKAGE   Barrett's esophagus Sister    Hyperlipidemia Sister    Kidney Stones Brother    Heart attack Brother    Colon cancer Neg Hx    Esophageal cancer Neg Hx    Liver cancer Neg  Hx    Stomach cancer Neg Hx      ROS:  Please see the history of present illness.   All other ROS reviewed and negative.     Physical Exam/Data: Vitals:   07/31/24 1947  BP: (!) 143/77   Pulse: 85  Resp: (!) 23  Temp: 97.7 F (36.5 C)  TempSrc: Oral  SpO2: 99%   No intake or output data in the 24 hours ending 07/31/24 2234    07/28/2024    9:45 AM 07/16/2024    3:05 PM 07/14/2024   11:26 AM  Last 3 Weights  Weight (lbs) 173 lb 14.4 oz 175 lb 169 lb 8 oz  Weight (kg) 78.881 kg 79.379 kg 76.885 kg     There is no height or weight on file to calculate BMI.    General: Well appearing, NAD HEENT: EOMI, No scleral icterus CV: Regular rate and rhythm. No M/G/R. 2+ radial pulses. Pulmonary: Clear to auscultation bilaterally. Abdomen: Soft, nontender. Extremities: No lower extremity edema. Neuro: Awake, alert.    EKG:  The EKG was personally reviewed and demonstrates:  pending Telemetry:  Telemetry was personally reviewed and demonstrates:  nsr  Relevant CV Studies: pending  Laboratory Data: High Sensitivity Troponin:  No results for input(s): TROPONINIHS in the last 720 hours.   Chemistry Recent Labs  Lab 07/28/24 0909  NA 137  K 4.0  CL 106  CO2 23  GLUCOSE 164*  BUN 17  CREATININE 0.88  CALCIUM  8.9  GFRNONAA >60  ANIONGAP 8    Recent Labs  Lab 07/28/24 0909  PROT 6.7  ALBUMIN 3.6  AST 28  ALT 14  ALKPHOS 62  BILITOT 0.5   Lipids No results for input(s): CHOL, TRIG, HDL, LABVLDL, LDLCALC, CHOLHDL in the last 168 hours.  Hematology Recent Labs  Lab 07/28/24 0909  WBC 4.0  RBC 3.06*  HGB 9.3*  HCT 29.9*  MCV 97.7  MCH 30.4  MCHC 31.1  RDW 16.5*  PLT 89*   Thyroid  No results for input(s): TSH, FREET4 in the last 168 hours.  BNPNo results for input(s): BNP, PROBNP in the last 168 hours.  DDimer No results for input(s): DDIMER in the last 168 hours.  Radiology/Studies:  No results found.   Assessment and Plan: NSTEMI Anemia Thrombocytopenia HTN HLD Metastatic rectal cancer on chemotherapy  CAD s/p PCI x 3 stents   NSTEMI  - Cardiology to continue following on consults  - Anti-coagulation: ACS  Heparin  - SL Nitroglycerin for chest discomfort - ASA 81mg  PO daily following load  - Defer P2Y12 until coronary anatomy is defined - Continue Crestor  40mg  PO daily, Zetia   - Trend HS Troponin to peak  - NPO @ Mn for consideration of invasive coronary angiography in the morning - Continue Toprol  25mg  XL, continue Lisinopril  10mg  daily - Labs: Hemoglobin A1c, Lipid profile, Iron studies, TSH w/Reflex  - TTE     Risk Assessment/Risk Scores:    TIMI Risk Score for Unstable Angina or Non-ST Elevation MI:   The patient's TIMI risk score is 5, which indicates a 26% risk of all cause mortality, new or recurrent myocardial infarction or need for urgent revascularization in the next 14 days.  [place]  Heart Association (NYHA) Functional Class NYHA Class II       For questions or updates, please contact [facility] Please consult [email or web address] for contact info under    Signed, Marlin DELENA Rhody, MD  07/31/2024 10:34 PM     [  1] No Known Allergies  "

## 2024-07-31 NOTE — Plan of Care (Signed)
 Transfer from [place] Mr. Deboard is a 73 year old male with pmh hypertension, hyperlipidemia, CAD status post prior stent back in 2011, and rectal cancer with liver mets receiving chemotherapy who presented to [facility] with complaints of chest pain.  Found to have Troponin 0.65_> 5.49.  EKG noted sinus tachycardia with ST depressions in the lateral leads.  Patient was placed on a heparin  drip and has been on 1 for the last 36 hours and noted to have a stable hemoglobin.  Platelets 90-110.  Echocardiogram noted EF to be 40 to 45% with regional wall abnormalities.  Patient was accepted to a cardiac telemetry bed.  Cardiology aware please notify upon patient arrival.

## 2024-07-31 NOTE — Progress Notes (Signed)
 PHARMACY - ANTICOAGULATION CONSULT NOTE  Pharmacy Consult for Heparin  Indication: chest pain/ACS  Allergies[1]  Patient Measurements: Height: 5' 9 (175.3 cm) Weight: 78.9 kg (173 lb 15.1 oz) IBW/kg (Calculated) : 70.7 HEPARIN  DW (KG): 78.9  Vital Signs: Temp: 97.7 F (36.5 C) (12/19 1947) Temp Source: Oral (12/19 1947) BP: 143/77 (12/19 1947) Pulse Rate: 85 (12/19 1947)  Labs: Recent Labs    07/31/24 2213  HGB 9.0*  HCT 29.3*  PLT 101*  HEPARINUNFRC 0.29*    Estimated Creatinine Clearance: 77 mL/min (by C-G formula based on SCr of 0.88 mg/dL).  Assessment: 73 yo male transferred from [facility] for evaluation of chest pain. Patient was started on a heparin  drip on 12/17, current rate is 1600 units/hr. Most recent CBC: Hgb 9.7, Plts 97 (on chemo). Per RN, no signs of bleeding reported.  PM: heparin  level returned this evening at 0.29 slightly subtherapeutic on 1600 units/hr. Hgb/plts low, stable  Goal of Therapy:  Heparin  level 0.3-0.7 units/ml Monitor platelets by anticoagulation protocol: Yes   Plan:  Increase heparin  infusion to 1700 units/hr Check heparin  level with AM labs  Daily heparin  level and CBC Monitor for signs/symptoms of bleeding  Lynwood Poplar, PharmD, BCPS Clinical Pharmacist 07/31/2024 11:11 PM          [1] No Known Allergies

## 2024-07-31 NOTE — H&P (Signed)
 " History and Physical    Joshua Nelson FMW:[id number] DOB: 11-16-1950 DOA: 07/31/2024  PCP: Sherre Clapper, MD  Patient coming from: [facility]  Chief Complaint: Chest pain  HPI: Joshua Nelson is a 73 y.o. 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  Initial vital signs in the ED: Temperature 100 F, pulse 118, respiratory rate 20, blood pressure 155/80, and SpO2 90% on room air.  He was placed on 2 L [place].  CBCs showing no leukocytosis, hemoglobin 10.9> 9.7> 9.7, platelet count 109> 92> 97k. Initial CMP notable for sodium 136, potassium 4.4, chloride 106, bicarb 20.,  BUN 22, creatinine 0.7, glucose 128, AST 35, ALT 18, alk phos 61, T. bili 0.7. Lipase 111 INR 1.1 Troponin 0.65 > 5.49 proBNP 3440 EKG per review of documentation from [facility] (not sent with paperwork for personal review) demonstrated sinus tachycardia with nonspecific ST/T wave changes in lateral leads. TSH 1.54 Lipid panel showing cholesterol 137, HDL 36, triglycerides 70, LDL 87 Lactic acid 2.0> 1.1 Procalcitonin 0.20 Magnesium 2.1 Respiratory viral panel negative MRSA PCR negative  CTA chest negative for aortic dissection/aneurysm or PE.  Showing patchy ground glass densities in bilateral lungs with septal thickening suspicious for pulmonary edema versus pneumonia.  Showing bilateral moderate pleural effusions with basal atelectasis.  In addition, showing splenomegaly measuring 14 cm.  Cardiology was consulted at [facility].  Recommended  heparin  infusion due to concern for NSTEMI and recommended transfer to [facility] for cardiac catheterization.  Case  was discussed with cardiac catheterization team at [facility] and they were agreeable to have the patient transferred under hospitalist team.  Patient was started on heparin  drip at [facility].  Continued on aspirin  and statin.  Also started on antibiotics for pneumonia including vancomycin, cefepime , and azithromycin .  Oncology consulted at [facility] and recommendation was to continue anticoagulation/antiplatelet agent as long as patient's platelets remain above 50k.  No echocardiogram results sent in paperwork from [place].  However, per review of brief note from Dr. Claudene who accepted patient for transfer from [facility]:  Echocardiogram noted EF to be 40 to 45% with regional wall abnormalities.    History provided by the patient and his family (sister and wife) at bedside.  Since Monday of this week, he has been complaining of intermittent left-sided chest pain which feels like an elephant on his chest.  Also having exertional dyspnea.  He was seen by his oncologist on Tuesday and received chemotherapy treatment but due to patient having chest pain, his oncologist became concerned and asked him to see a cardiologist.  He had an appointment with cardiology on Wednesday but due to having worsening chest pain family took him to [facility] ED.  When he arrived to [place], he was noted to have a fever.  Patient denies any active chest pain or shortness of breath at this time.  No hematemesis, hematochezia, melena, hematuria, or any other bleeding reported.  No other complaints.  Not on home oxygen.  He has a history of OSA but does not use CPAP at home.  Per sister, patient had 2 cardiac stents placed in 2011 and another 1 stent placed in 2012.  Review of Systems:  Review of Systems  All other  systems reviewed and are negative.   Past Medical History:  Diagnosis Date   Arthritis    BMI 27.0-27.9,adult 06/09/2023   BPH with obstruction/lower urinary tract symptoms  08/01/2021   Chronic left shoulder pain 11/30/2019   Diabetes mellitus without complication (HCC)    Diabetic polyneuropathy associated with type 2 diabetes mellitus (HCC) 11/30/2019   Encounter for central line care 01/02/2024   Encounter for immunization 06/09/2023   Hypertension    Hypertensive heart disease without heart failure 11/30/2019   Insomnia    Mixed hyperlipidemia 11/30/2019   Myocardial infarct [facility])    Obstructive sleep apnea 04/03/2018   PONV (postoperative nausea and vomiting)    Prostate cancer (HCC) 07/2021   Rectal bleeding 09/21/2023   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) 10/30/2023   Urge incontinence 07/12/2022   Vertigo 01/19/2024    Past Surgical History:  Procedure Laterality Date   CARDIAC CATHETERIZATION     THREE CARDIAC STENTS PLACED   COLONOSCOPY W/ POLYPECTOMY     CYSTOSCOPY WITH URETHRAL DILATATION N/A 08/01/2021   Procedure: CYSTOSCOPY WITH BALLOON URETHRAL DILATATION;  Surgeon: Elisabeth Valli BIRCH, MD;  Location: [facility]S;  Service: Urology;  Laterality: N/A;   left cataract  10/2019   NOSE SURGERY     RESULT OF DOG BITE DURING CHILDHOOD   PROSTATE BIOPSY     TRANSURETHRAL RESECTION OF BLADDER TUMOR N/A 08/01/2021   Procedure: TRANSURETHRAL RESECTION OF PROSTATE;  Surgeon: Elisabeth Valli BIRCH, MD;  Location: [facility];  Service: Urology;  Laterality: N/A;  90 MINS   URETHRAL STRICTURE DILATATION       reports that he quit smoking about 22 years ago. His smoking use included cigarettes. He has never used smokeless tobacco. He reports that he does not currently use alcohol. He reports that he does not use drugs.  Allergies[1]  Family History  Problem Relation Age of Onset   Endometrial cancer Mother    CAD Mother    AAA (abdominal aortic aneurysm) Mother    Dementia Father    Anxiety disorder Father    Depression Father    Heart disease Father    Hypertension Father    Deep vein thrombosis Father    Kidney Stones Sister        H/O :  INTESTINAL BLOCKAGE   Barrett's esophagus Sister    Hyperlipidemia Sister    Kidney Stones Brother    Heart attack Brother    Colon cancer Neg Hx    Esophageal cancer Neg Hx    Liver cancer Neg Hx    Stomach cancer Neg Hx     Prior to Admission medications  Medication Sig Start Date End Date Taking? Authorizing Provider  aspirin  EC 81 MG tablet Take 81 mg by mouth daily. Swallow whole.    [provider]  Continuous Glucose Receiver (FREESTYLE LIBRE READER) DEVI Freestyle libre reader that works with freestyle libre 3+ 04/20/24   Cox, Kirsten, MD  Continuous Glucose Sensor (FREESTYLE LIBRE 3 PLUS SENSOR) MISC Change sensor every 15 days. 04/20/24   CoxAbigail, MD  dapagliflozin  propanediol (FARXIGA ) 10 MG TABS tablet Take 1 tablet (10 mg total) by mouth daily. 05/18/24   Cox, Abigail, MD  ezetimibe  (ZETIA ) 10 MG tablet TAKE ONE (1) TABLET ONCE DAILY 01/18/21   Cox, Kirsten, MD  gabapentin  (NEURONTIN ) 300 MG capsule Take 1 capsule (300 mg total) by mouth 3 (three) times daily. 05/24/24   Cox, Abigail, MD  Glucagon  (GVOKE HYPOPEN  2-PACK) 0.5 MG/0.1ML EMMANUEL  Inject 0.5 mg into the skin daily as needed. 01/14/24   CoxAbigail, MD  lisinopril  (ZESTRIL ) 10 MG tablet Take 1 tablet by mouth once daily. 07/21/24   CoxAbigail, MD  meclizine  (ANTIVERT ) 25 MG tablet Take 1 tablet (25 mg total) by mouth 3 (three) times daily as needed for dizziness. 01/14/24   Cox, Abigail, MD  metoprolol  succinate (TOPROL -XL) 25 MG 24 hr tablet TAKE 1 TABLET BY MOUTH ONCE DAILY IN THE MORNING. 12/09/23   Sherre Abigail, MD  OLANZapine  (ZYPREXA ) 5 MG tablet One tab at bedtime prn nauese 01/28/24   Lewis, Dequincy A, MD  ondansetron  (ZOFRAN ) 8 MG tablet Take 1 tablet (8 mg total) by mouth every 8 (eight) hours as needed for nausea or vomiting. 05/12/24   Harl Setter A, NP  prochlorperazine  (COMPAZINE ) 10 MG tablet Take 1 tablet (10 mg total) by mouth every 6 (six) hours as needed for nausea or vomiting. 12/10/23   Harl Setter A, NP  rosuvastatin  (CRESTOR ) 40 MG tablet Take 1 tablet (40 mg total) by mouth at bedtime. 04/26/24   Sherre Abigail, MD    Physical Exam: There were no vitals filed for this visit.  Physical Exam Vitals reviewed.  Constitutional:      General: He is not in acute distress. HENT:     Head: Normocephalic and atraumatic.  Eyes:     Extraocular Movements: Extraocular movements intact.  Cardiovascular:     Rate and Rhythm: Normal rate and regular rhythm.     Heart sounds: Normal heart sounds.  Pulmonary:     Effort: Pulmonary effort is normal. No respiratory distress.     Breath sounds: No wheezing.     Comments: Crackles appreciated at the right lung base Abdominal:     General: Bowel sounds are normal.     Palpations: Abdomen is soft.     Tenderness: There is no abdominal tenderness. There is no guarding.  Musculoskeletal:     Cervical back: Normal range of motion.     Right lower leg: No edema.     Left lower leg: No edema.  Skin:    General: Skin is warm and dry.  Neurological:     General: No focal deficit present.     Mental Status: He is alert and oriented to person, place, and time.     Labs on Admission: I have personally reviewed following labs and imaging studies  CBC: Recent Labs  Lab 07/28/24 0909  WBC 4.0  NEUTROABS 2.7  HGB 9.3*  HCT 29.9*  MCV 97.7  PLT 89*   Basic Metabolic Panel: Recent Labs  Lab 07/28/24 0909  NA 137  K 4.0  CL 106  CO2 23  GLUCOSE 164*  BUN 17  CREATININE 0.88  CALCIUM  8.9   GFR: Estimated Creatinine Clearance: 77 mL/min (by C-G formula based on SCr of 0.88 mg/dL). Liver Function Tests: Recent Labs  Lab 07/28/24 0909  AST 28  ALT 14  ALKPHOS 62  BILITOT 0.5  PROT 6.7  ALBUMIN 3.6   No results for input(s): LIPASE, AMYLASE in the last 168 hours. No results for input(s): AMMONIA in the last 168 hours. Coagulation Profile: No results for input(s): INR, PROTIME in the last 168 hours. Cardiac  Enzymes: No results for input(s): CKTOTAL, CKMB, CKMBINDEX, TROPONINI in the last 168 hours. BNP (last 3 results) No results for input(s): PROBNP in the last 8760 hours. HbA1C: No results for input(s): HGBA1C in the last 72 hours. CBG: No  results for input(s): GLUCAP in the last 168 hours. Lipid Profile: No results for input(s): CHOL, HDL, LDLCALC, TRIG, CHOLHDL, LDLDIRECT in the last 72 hours. Thyroid  Function Tests: No results for input(s): TSH, T4TOTAL, FREET4, T3FREE, THYROIDAB in the last 72 hours. Anemia Panel: No results for input(s): VITAMINB12, FOLATE, FERRITIN, TIBC, IRON, RETICCTPCT in the last 72 hours. Urine analysis:    Component Value Date/Time   PROTEINUR NEGATIVE 07/28/2024 9090    Radiological Exams on Admission: No results found.  Assessment and Plan  NSTEMI History of CAD status post multiple stents Patient is presenting with 4-day history of intermittent left-sided chest pain.  EKG done at [facility] was showing nonspecific ST/T wave changes in lateral leads and troponin 0.65> 5.49.  Echo done at [facility] noted EF 40 to 45% with regional wall motion abnormalities.  Patient was started on heparin  drip and sent to [facility] tonight to be evaluated by cardiology for possible cardiac catheterization.  Patient is currently resting comfortably and not endorsing active chest pain at this time. -Cardiac monitoring -Continue heparin  drip -Continue aspirin , ezetimibe , rosuvastatin  -Repeat EKG and troponin -Keep n.p.o. after midnight -Cardiology consulted at [facility]  Community-acquired pneumonia Presented to [facility] with fever and CT was showing patchy ground glass densities in bilateral lungs with septal thickening suspicious for pulmonary edema versus pneumonia.  Also showing bilateral moderate pleural effusions with basilar atelectasis.  No leukocytosis on labs.  Procalcitonin 0.20.  Patient was  started on antibiotics including vancomycin, cefepime , and azithromycin  at [place].  MRSA PCR screen was negative.  Respiratory viral panel was negative.  Continue antibiotic coverage with cefepime  and azithromycin .  Acute CHF Echo and CT findings as above.  proBNP 3448 at [facility].  Repeat proBNP ordered and cardiology consulted as above.  Acute hypoxemic respiratory failure In the setting of problems listed above.  SpO2 90% on room air at [place], currently stable on 2 L [place].  Continue supplemental oxygen, wean as tolerated.  Elevated lipase Lipase 111 on labs done at [facility].  Acute pancreatitis less likely as patient is not endorsing any abdominal pain, nausea, or vomiting.  He has no abdominal tenderness on exam.  Repeat lipase ordered.  Stage IV rectal cancer with liver metastases Currently on palliative chemotherapy with FOLFOX/Avastin .  Outpatient oncology follow-up.  Chronic anemia and thrombocytopenia In the setting of cancer treatment as above.  Hemoglobin 10.9> 9.7> 9.7 and platelet count 109> 92> 97k on labs done at [facility]. No signs or symptoms of bleeding.  Seen by oncology at [facility] and recommendation was to continue anticoagulation/antiplatelet agent as long as platelet count remained above 50k.  Repeat CBC ordered.  Hypertension SBP currently in the 140s.  Continue lisinopril  and metoprolol .  Hyperlipidemia Continue ezetimibe  and rosuvastatin .  Type 2 diabetes Hemoglobin A1c 5.0 on 04/20/2024. Placed on sensitive sliding scale insulin  every 4 hours.  Peripheral neuropathy Continue gabapentin .  DVT prophylaxis: IV heparin  gtt Code Status: Full Code (discussed with the patient) Family Communication: Patient's wife and sister at bedside. Consults called: Cardiology (Dr. Lea) Level of care: Progressive Care Unit Admission status: It is my clinical opinion that admission to INPATIENT is reasonable and necessary because of the expectation that this patient will  require hospital care that crosses at least 2 midnights to treat this condition based on the medical complexity of the problems presented.  Given the aforementioned information, the predictability of an adverse outcome is felt to be significant.  Editha Ram MD [facility]  If 7PM-7AM, please  contact night-coverage [email or web address]  07/31/2024, 7:42 PM       [1] No Known Allergies  "

## 2024-08-01 ENCOUNTER — Inpatient Hospital Stay (HOSPITAL_COMMUNITY)

## 2024-08-01 ENCOUNTER — Encounter (HOSPITAL_COMMUNITY): Payer: Self-pay | Admitting: Internal Medicine

## 2024-08-01 ENCOUNTER — Other Ambulatory Visit: Payer: Self-pay

## 2024-08-01 DIAGNOSIS — I214 Non-ST elevation (NSTEMI) myocardial infarction: Secondary | ICD-10-CM

## 2024-08-01 LAB — ECHOCARDIOGRAM COMPLETE
AR max vel: 1.56 cm2
AV Area VTI: 1.64 cm2
AV Area mean vel: 1.43 cm2
AV Mean grad: 7 mmHg
AV Peak grad: 12.9 mmHg
Ao pk vel: 1.8 m/s
Area-P 1/2: 7.44 cm2
Calc EF: 30.7 %
Height: 69 in
S' Lateral: 5.1 cm
Single Plane A2C EF: 25.5 %
Single Plane A4C EF: 32.5 %
Weight: 2783.09 [oz_av]

## 2024-08-01 LAB — HEPARIN LEVEL (UNFRACTIONATED)
Heparin Unfractionated: 0.41 [IU]/mL (ref 0.30–0.70)
Heparin Unfractionated: 0.28 [IU]/mL — ABNORMAL LOW (ref 0.30–0.70)
Heparin Unfractionated: 0.4 [IU]/mL (ref 0.30–0.70)

## 2024-08-01 LAB — CBC
HCT: 29.1 % — ABNORMAL LOW (ref 39.0–52.0)
HCT: 28.6 % — ABNORMAL LOW (ref 39.0–52.0)
Hemoglobin: 9.2 g/dL — ABNORMAL LOW (ref 13.0–17.0)
Hemoglobin: 9.2 g/dL — ABNORMAL LOW (ref 13.0–17.0)
MCH: 31.4 pg (ref 26.0–34.0)
MCH: 31.3 pg (ref 26.0–34.0)
MCHC: 31.6 g/dL (ref 30.0–36.0)
MCHC: 32.2 g/dL (ref 30.0–36.0)
MCV: 99.3 fL (ref 80.0–100.0)
MCV: 97.3 fL (ref 80.0–100.0)
Platelets: 100 K/uL — ABNORMAL LOW (ref 150–400)
Platelets: 101 K/uL — ABNORMAL LOW (ref 150–400)
RBC: 2.93 MIL/uL — ABNORMAL LOW (ref 4.22–5.81)
RBC: 2.94 MIL/uL — ABNORMAL LOW (ref 4.22–5.81)
RDW: 16.5 % — ABNORMAL HIGH (ref 11.5–15.5)
RDW: 16.4 % — ABNORMAL HIGH (ref 11.5–15.5)
WBC: 3.1 K/uL — ABNORMAL LOW (ref 4.0–10.5)
WBC: 3.5 K/uL — ABNORMAL LOW (ref 4.0–10.5)
nRBC: 0 % (ref 0.0–0.2)
nRBC: 0 % (ref 0.0–0.2)

## 2024-08-01 LAB — GLUCOSE, CAPILLARY
Glucose-Capillary: 107 mg/dL — ABNORMAL HIGH (ref 70–99)
Glucose-Capillary: 102 mg/dL — ABNORMAL HIGH (ref 70–99)
Glucose-Capillary: 97 mg/dL (ref 70–99)
Glucose-Capillary: 93 mg/dL (ref 70–99)
Glucose-Capillary: 89 mg/dL (ref 70–99)
Glucose-Capillary: 91 mg/dL (ref 70–99)

## 2024-08-01 LAB — TROPONIN T, HIGH SENSITIVITY: Troponin T High Sensitivity: 762 ng/L (ref 0–19)

## 2024-08-01 MED ORDER — TICAGRELOR 90 MG PO TABS
90.0000 mg | ORAL_TABLET | Freq: Two times a day (BID) | ORAL | Status: DC
  Administered 2024-08-01 – 2024-08-04 (×6): 90 mg via ORAL
  Filled 2024-08-01 (×6): qty 1

## 2024-08-01 MED ORDER — METOPROLOL SUCCINATE ER 50 MG PO TB24
50.0000 mg | ORAL_TABLET | Freq: Every morning | ORAL | Status: DC
  Administered 2024-08-02 – 2024-08-04 (×3): 50 mg via ORAL
  Filled 2024-08-01 (×3): qty 1

## 2024-08-01 MED ORDER — PERFLUTREN LIPID MICROSPHERE
1.0000 mL | INTRAVENOUS | Status: AC | PRN
  Administered 2024-08-01: 3 mL via INTRAVENOUS

## 2024-08-01 MED ORDER — METOPROLOL SUCCINATE ER 25 MG PO TB24
25.0000 mg | ORAL_TABLET | Freq: Every morning | ORAL | Status: DC
  Administered 2024-08-01: 25 mg via ORAL
  Filled 2024-08-01: qty 1

## 2024-08-01 MED ORDER — LABETALOL HCL 5 MG/ML IV SOLN
10.0000 mg | INTRAVENOUS | Status: DC | PRN
  Administered 2024-08-02 (×2): 10 mg via INTRAVENOUS
  Filled 2024-08-01 (×2): qty 4

## 2024-08-01 NOTE — Progress Notes (Addendum)
 PHARMACY - ANTICOAGULATION CONSULT NOTE  Pharmacy Consult for Heparin  Indication: chest pain/ACS  Allergies[1]  Patient Measurements: Height: 5' 9 (175.3 cm) Weight: 78.9 kg (173 lb 15.1 oz) IBW/kg (Calculated) : 70.7 HEPARIN  DW (KG): 78.9  Vital Signs: Temp: 98.1 F (36.7 C) (12/20 1103) Temp Source: Oral (12/20 1103) BP: 156/85 (12/20 1304) Pulse Rate: 80 (12/20 1103)  Labs: Recent Labs    07/31/24 2213 08/01/24 0407 08/01/24 0500 08/01/24 1331  HGB 9.0* 9.2*  --   --   HCT 29.3* 29.1*  --   --   PLT 101* 100*  --   --   HEPARINUNFRC 0.29*  --  0.41 0.28*  CREATININE 0.79  --   --   --     Estimated Creatinine Clearance: 84.7 mL/min (by C-G formula based on SCr of 0.79 mg/dL).  Assessment: 73 yo male transferred from [facility] for evaluation of chest pain. Patient was started on a heparin  drip on 12/17 at 1600 units/hr. Most recent CBC: Hgb 9.7, Plts 97 (on chemo). Per RN, no signs of bleeding reported.  Heparin  level 0.28 is slightly subtherapeutic with heparin  running at 1700 units/hr. Hgb (9.2) and PLTs (100) are stable. Patient renal function is stable. Per RN, no report of pauses or issues with the line. RN mentioned new red streaks in stool, but no other signs or symptoms of bleeding, and is making MD aware.   Goal of Therapy:  Heparin  level 0.3-0.7 units/ml Monitor platelets by anticoagulation protocol: Yes   Plan:  Increase heparin  to 1800 units/hr Recheck heparin  level after 8 hours to confirm therapeutic Monitor daily heparin  levels and CBC Monitor for any signs/symptoms of bleeding  Thank you for allowing pharmacy to be involved with this patient's care.  Mendel Barter, PharmD PGY1 Clinical Pharmacist [facility]  08/01/2024 2:43 PM    [1] No Known Allergies

## 2024-08-01 NOTE — Progress Notes (Signed)
 [facility] night cross cover note:   I was notified by the patient's RN that the patient's most recent systolic blood pressures in the 160s, with corresponding heart rates in the 80s.  He was admitted earlier this evening for NSTEMI, and cardiology is consulting, with recommendations that included continuation of metoprolol  succinate as well as lisinopril .  His metoprolol  and lisinopril  are reordered, with first doses sent to occur in the morning.  I retimed the dose of his metoprolol , requesting first dose now.  I have also order prn IV Labetalol  for systolic blood pressures greater than 170 mmHg.      Eva Pore, DO Hospitalist

## 2024-08-01 NOTE — Progress Notes (Signed)
 PHARMACY - ANTICOAGULATION CONSULT NOTE  Pharmacy Consult for Heparin  Indication: chest pain/ACS  Allergies[1]  Patient Measurements: Height: 5' 9 (175.3 cm) Weight: 78.9 kg (173 lb 15.1 oz) IBW/kg (Calculated) : 70.7 HEPARIN  DW (KG): 78.9  Vital Signs: Temp: 97.8 F (36.6 C) (12/20 0251) Temp Source: Oral (12/20 0251) BP: 157/82 (12/20 0251) Pulse Rate: 84 (12/20 0251)  Labs: Recent Labs    07/31/24 2213 08/01/24 0407 08/01/24 0500  HGB 9.0* 9.2*  --   HCT 29.3* 29.1*  --   PLT 101* 100*  --   HEPARINUNFRC 0.29*  --  0.41  CREATININE 0.79  --   --     Estimated Creatinine Clearance: 84.7 mL/min (by C-G formula based on SCr of 0.79 mg/dL).  Assessment: 73 yo male transferred from [facility] for evaluation of chest pain. Patient was started on a heparin  drip on 12/17, current rate is 1600 units/hr. Most recent CBC: Hgb 9.7, Plts 97 (on chemo). Per RN, no signs of bleeding reported.  Heparin  level 0.41 is therapeutic with heparin  running at 1700 units/hr. Hgb (9.2) and PLTs (100) are on the lower end of normal but have remained stable. Patient renal function is stable. Per RN, no report of pauses, issues with the line, or signs of bleeding.     Goal of Therapy:  Heparin  level 0.3-0.7 units/ml Monitor platelets by anticoagulation protocol: Yes   Plan:  Continue heparin  at 1700 units/hr Recheck heparin  level after 8 hours to confirm therapeutic Monitor daily heparin  levels and CBC Monitor for any signs/symptoms of bleeding  Thank you for allowing pharmacy to be involved with this patient's care.  Mendel Barter, PharmD PGY1 Clinical Pharmacist [facility]  08/01/2024 7:27 AM    [1] No Known Allergies

## 2024-08-01 NOTE — Progress Notes (Signed)
 PHARMACY - ANTICOAGULATION CONSULT NOTE  Pharmacy Consult for Heparin  Indication: chest pain/ACS  Allergies[1]  Patient Measurements: Height: 5' 9 (175.3 cm) Weight: 78.9 kg (173 lb 15.1 oz) IBW/kg (Calculated) : 70.7 HEPARIN  DW (KG): 78.9  Vital Signs: Temp: 98.1 F (36.7 C) (12/20 1945) Temp Source: Oral (12/20 1945) BP: 145/78 (12/20 1945)  Labs: Recent Labs    07/31/24 2213 08/01/24 0407 08/01/24 0500 08/01/24 1331 08/01/24 2313  HGB 9.0* 9.2*  --   --  9.2*  HCT 29.3* 29.1*  --   --  28.6*  PLT 101* 100*  --   --  101*  HEPARINUNFRC 0.29*  --  0.41 0.28* 0.40  CREATININE 0.79  --   --   --   --     Estimated Creatinine Clearance: 84.7 mL/min (by C-G formula based on SCr of 0.79 mg/dL).  Assessment: 73 yo male transferred from [facility] for evaluation of chest pain. Patient was started on a heparin  drip on 12/17 at 1600 units/hr. Most recent CBC: Hgb 9.7, Plts 97 (on chemo). Per RN, no signs of bleeding reported.  Heparin  level therapeutic with heparin  running at 1800 units/hr. Hgb low, stable (9.2) and PLTs 100s. Per RN, no signs/symptoms of bleeding at this time   Goal of Therapy:  Heparin  level 0.3-0.7 units/ml Monitor platelets by anticoagulation protocol: Yes   Plan:  Increase heparin  to 1800 units/hr Recheck heparin  level with AM labs Monitor daily heparin  levels and CBC Monitor for any signs/symptoms of bleeding  Thank you for allowing pharmacy to be involved with this patient's care.  Lynwood Poplar, PharmD, BCPS Clinical Pharmacist 08/01/2024 11:57 PM       [1] No Known Allergies

## 2024-08-01 NOTE — Consult Note (Signed)
 " Cardiology Consultation:   Patient ID: Joshua Nelson MRN: [id number]; DOB: [birth year]  Admit date: 07/31/2024 Date of Consult: 08/01/2024  Primary Care Provider: Sherre Clapper, MD [facility] Cardiologist: None  [facility] Electrophysiologist:  None   Patient Profile:   Joshua Nelson is a 73 y.o. 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   History of Present Illness:   Joshua Nelson had acute onset chest pressure with associated shortness of breath several days ago that was fairly severe and felt like an elephant was on his chest.  He eventually presented to [facility] on 07/29/24 and was started on heparin  gtt for treatment of NSTEMI.  He had stable chronic anemia and thrombocytopenia.  CT PE protocol without acute PE but was suspicious for pulmonary edema versus pneumonia.   VS on ED arrival with low grade temperature (100F), tachycardia (HR 118), tachypnea (RR 20) and HTN (155/80). Mildly hypoxic (SpO2 90%) on RA so placed on 2L [place].   Lab work notable for chronic anemia and thrombocytopenia that was slightly worse than prior (Hb 10.9->9.7, plt 109->92->97). Trop 0.65->5.49 and proBNP 3440. Mild lactic acidosis on presentation (2.0->1.1).  RVP negative for any viral infection.  He just recently was at the oncology clinic on 07/28/2024 before heading to his 17th cycle of FOLFOX/Avastin .  He had commented that he had occasional chest pain and dyspnea especially climbing stairs at that appointment.  He said that his symptoms actually initially started on Monday but they were with exertional activities and not as severe.  On Tuesday he did not have any symptoms but on Wednesday he had more severe symptoms that were similar to his prior anginal equivalents although this time there was more discomfort and less shortness of breath as  prior.  He had his coronary evaluations done at [facility] and had a total of 3 stents placed.   Past Medical History:  Diagnosis Date   Acute CHF (HCC) 07/31/2024   Arthritis    BMI 27.0-27.9,adult 06/09/2023   BPH with obstruction/lower urinary tract symptoms 08/01/2021   Chronic left shoulder pain 11/30/2019   Diabetes mellitus without complication (HCC)    Diabetic polyneuropathy associated with type 2 diabetes mellitus (HCC) 11/30/2019   Encounter for central line care 01/02/2024   Encounter for immunization 06/09/2023   Hypertension    Hypertensive heart disease without heart failure 11/30/2019   Insomnia    Mixed hyperlipidemia 11/30/2019   Myocardial infarct [facility])    Obstructive sleep apnea 04/03/2018   PONV (postoperative nausea and vomiting)    Prostate cancer (HCC) 07/2021   Rectal bleeding 09/21/2023   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) 10/30/2023   Urge incontinence 07/12/2022   Vertigo 01/19/2024   Past Surgical History:  Procedure Laterality Date   CARDIAC CATHETERIZATION     THREE CARDIAC STENTS PLACED   COLONOSCOPY W/ POLYPECTOMY     CYSTOSCOPY WITH URETHRAL DILATATION N/A 08/01/2021   Procedure: CYSTOSCOPY WITH BALLOON URETHRAL DILATATION;  Surgeon: Elisabeth Valli BIRCH, MD;  Location: [facility];  Service: Urology;  Laterality: N/A;   left cataract  10/2019   NOSE SURGERY     RESULT OF DOG BITE DURING CHILDHOOD   PROSTATE BIOPSY     TRANSURETHRAL RESECTION OF BLADDER TUMOR N/A 08/01/2021   Procedure: TRANSURETHRAL RESECTION OF PROSTATE;  Surgeon: Elisabeth Valli BIRCH,  MD;  Location: [facility];  Service: Urology;  Laterality: N/A;  90 MINS   URETHRAL STRICTURE DILATATION      Home Medications:  Prior to Admission medications  Medication Sig Start Date End Date Taking? Authorizing Provider  aspirin  EC 81 MG tablet Take 81 mg by mouth daily. Swallow whole.   Yes [provider]  dapagliflozin  propanediol (FARXIGA ) 10 MG TABS tablet Take 1 tablet (10 mg  total) by mouth daily. 05/18/24  Yes Cox, Kirsten, MD  ezetimibe  (ZETIA ) 10 MG tablet TAKE ONE (1) TABLET ONCE DAILY 01/18/21  Yes Cox, Kirsten, MD  gabapentin  (NEURONTIN ) 300 MG capsule Take 1 capsule (300 mg total) by mouth 3 (three) times daily. 05/24/24  Yes Cox, Kirsten, MD  Glucagon  (GVOKE HYPOPEN  2-PACK) 0.5 MG/0.1ML SOAJ Inject 0.5 mg into the skin daily as needed. 01/14/24  Yes Cox, Kirsten, MD  lisinopril  (ZESTRIL ) 10 MG tablet Take 1 tablet by mouth once daily. 07/21/24  Yes Cox, Kirsten, MD  meclizine  (ANTIVERT ) 25 MG tablet Take 1 tablet (25 mg total) by mouth 3 (three) times daily as needed for dizziness. 01/14/24  Yes Cox, Kirsten, MD  metoprolol  succinate (TOPROL -XL) 25 MG 24 hr tablet TAKE 1 TABLET BY MOUTH ONCE DAILY IN THE MORNING. 12/09/23  Yes Cox, Kirsten, MD  ondansetron  (ZOFRAN ) 8 MG tablet Take 1 tablet (8 mg total) by mouth every 8 (eight) hours as needed for nausea or vomiting. 05/12/24  Yes Harl Setter A, NP  prochlorperazine  (COMPAZINE ) 10 MG tablet Take 1 tablet (10 mg total) by mouth every 6 (six) hours as needed for nausea or vomiting. 12/10/23  Yes Harl Setter A, NP  rosuvastatin  (CRESTOR ) 40 MG tablet Take 1 tablet (40 mg total) by mouth at bedtime. 04/26/24  Yes Cox, Kirsten, MD  Continuous Glucose Receiver (FREESTYLE LIBRE READER) DEVI Freestyle libre reader that works with freestyle libre 3+ 04/20/24   Cox, Kirsten, MD  Continuous Glucose Sensor (FREESTYLE LIBRE 3 PLUS SENSOR) MISC Change sensor every 15 days. 04/20/24   CoxAbigail, MD  OLANZapine  (ZYPREXA ) 5 MG tablet One tab at bedtime prn nauese Patient taking differently: Take 5 mg by mouth daily as needed (for mood). 01/28/24   Ezzard Valaria LABOR, MD   Inpatient Medications: Scheduled Meds:  aspirin  EC  81 mg Oral Daily   ezetimibe   10 mg Oral Daily   gabapentin   300 mg Oral TID   insulin  aspart  0-9 Units Subcutaneous Q4H   lisinopril   10 mg Oral Daily   metoprolol  succinate  25 mg Oral q morning    rosuvastatin   40 mg Oral QHS   Continuous Infusions:  azithromycin      ceFEPime  (MAXIPIME ) IV 2 g (08/01/24 0642)   heparin  1,700 Units/hr (08/01/24 0432)   PRN Meds: acetaminophen  **OR** [DISCONTINUED] acetaminophen , labetalol   Allergies:   Allergies[1]  Social History:   Social History   Socioeconomic History   Marital status: Married    Spouse name: Nathanel   Number of children: 1   Years of education: 12   Highest education level: 12th grade  Occupational History   Occupation: RETIRED - TRUCK DRIVER  Tobacco Use   Smoking status: Former    Current packs/day: 0.00    Types: Cigarettes    Quit date: 2003    Years since quitting: 22.9   Smokeless tobacco: Never  Vaping Use   Vaping status: Never Used  Substance and Sexual Activity   Alcohol use: Not Currently    Comment: none since  2000   Drug use: Never   Sexual activity: Not Currently  Other Topics Concern   Not on file  Social History Narrative   Lives at home with his wife.   2 cups caffeine per day.   Right-handed.   Social Drivers of Health   Tobacco Use: Medium Risk (07/13/2024)   Patient History    Smoking Tobacco Use: Former    Smokeless Tobacco Use: Never    Passive Exposure: Not on file  Financial Resource Strain: Low Risk (04/30/2024)   Overall Financial Resource Strain (CARDIA)    Difficulty of Paying Living Expenses: Not hard at all  Food Insecurity: No Food Insecurity (04/30/2024)   Epic    Worried About Programme Researcher, Broadcasting/film/video in the Last Year: Never true    Ran Out of Food in the Last Year: Never true  Transportation Needs: No Transportation Needs (04/30/2024)   Epic    Lack of Transportation (Medical): No    Lack of Transportation (Non-Medical): No  Physical Activity: Inactive (04/30/2024)   Exercise Vital Sign    Days of Exercise per Week: 0 days    Minutes of Exercise per Session: 0 min  Stress: No Stress Concern Present (04/30/2024)   [employer] of Occupational Health - Occupational  Stress Questionnaire    Feeling of Stress: Only a little  Social Connections: Socially Integrated (04/30/2024)   Social Connection and Isolation Panel    Frequency of Communication with Friends and Family: More than three times a week    Frequency of Social Gatherings with Friends and Family: More than three times a week    Attends Religious Services: More than 4 times per year    Active Member of Clubs or Organizations: Yes    Attends Banker Meetings: More than 4 times per year    Marital Status: Married  Catering Manager Violence: Not At Risk (04/30/2024)   Epic    Fear of Current or Ex-Partner: No    Emotionally Abused: No    Physically Abused: No    Sexually Abused: No  Depression (PHQ2-9): Low Risk (06/30/2024)   Depression (PHQ2-9)    PHQ-2 Score: 0  Alcohol Screen: Low Risk (04/30/2024)   Alcohol Screen    Last Alcohol Screening Score (AUDIT): 0  Housing: Unknown (04/30/2024)   Epic    Unable to Pay for Housing in the Last Year: No    Number of Times Moved in the Last Year: Not on file    Homeless in the Last Year: No  Utilities: Not At Risk (04/30/2024)   Epic    Threatened with loss of utilities: No  Health Literacy: Adequate Health Literacy (04/30/2024)   B1300 Health Literacy    Frequency of need for help with medical instructions: Never    Family History:    Family History  Problem Relation Age of Onset   Endometrial cancer Mother    CAD Mother    AAA (abdominal aortic aneurysm) Mother    Dementia Father    Anxiety disorder Father    Depression Father    Heart disease Father    Hypertension Father    Deep vein thrombosis Father    Kidney Stones Sister        H/O : INTESTINAL BLOCKAGE   Barrett's esophagus Sister    Hyperlipidemia Sister    Kidney Stones Brother    Heart attack Brother    Colon cancer Neg Hx    Esophageal cancer Neg Hx  Liver cancer Neg Hx    Stomach cancer Neg Hx     ROS:  Review of Systems: [y] = yes, [ ]  = no       General: Weight gain [ ] ; Weight loss [ ] ; Anorexia [ ] ; Fatigue [ ] ; Fever [ ] ; Chills [ ] ; Weakness [ ]    Cardiac: Chest pain/pressure [y]; Resting SOB [y]; Exertional SOB [ ] ; Orthopnea [ ] ; Pedal Edema [ ] ; Palpitations [ ] ; Syncope [ ] ; Presyncope [ ] ; Paroxysmal nocturnal dyspnea [ ]    Pulmonary: Cough [ ] ; Wheezing [ ] ; Hemoptysis [ ] ; Sputum [ ] ; Snoring [ ]    GI: Vomiting [ ] ; Dysphagia [ ] ; Melena [ ] ; Hematochezia [ ] ; Heartburn [ ] ; Abdominal pain [ ] ; Constipation [ ] ; Diarrhea [ ] ; BRBPR [ ]    GU: Hematuria [ ] ; Dysuria [ ] ; Nocturia [ ]  Vascular: Pain in legs with walking [ ] ; Pain in feet with lying flat [ ] ; Non-healing sores [ ] ; Stroke [ ] ; TIA [ ] ; Slurred speech [ ] ;   Neuro: Headaches [ ] ; Vertigo [ ] ; Seizures [ ] ; Paresthesias [ ] ;Blurred vision [ ] ; Diplopia [ ] ; Vision changes [ ]    Ortho/Skin: Arthritis [ ] ; Joint pain [ ] ; Muscle pain [ ] ; Joint swelling [ ] ; Back Pain [ ] ; Rash [ ]    Psych: Depression [ ] ; Anxiety [ ]    Heme: Bleeding problems [ ] ; Clotting disorders [ ] ; Anemia [ ]    Endocrine: Diabetes [ ] ; Thyroid  dysfunction [ ]    Physical Exam/Data:   Vitals:   07/31/24 2344 08/01/24 0153 08/01/24 0251 08/01/24 0900  BP: (!) 154/78 (!) 168/83 (!) 157/82 (!) 152/81  Pulse: 80  84 79  Resp: 20  20 17   Temp: 97.8 F (36.6 C)  97.8 F (36.6 C) 97.8 F (36.6 C)  TempSrc: Oral  Oral Oral  SpO2: 100%  97% 97%  Weight:      Height:       Intake/Output Summary (Last 24 hours) at 08/01/2024 1035 Last data filed at 08/01/2024 0900 Gross per 24 hour  Intake --  Output 1400 ml  Net -1400 ml      07/31/2024   11:00 PM 07/28/2024    9:45 AM 07/16/2024    3:05 PM  Last 3 Weights  Weight (lbs) 173 lb 15.1 oz 173 lb 14.4 oz 175 lb  Weight (kg) 78.9 kg 78.881 kg 79.379 kg     Body mass index is 25.69 kg/m.  General:  Well nourished, well developed, in no acute distress HEENT: normal Vascular: No carotid bruits; FA pulses 2+ bilaterally without bruits   Cardiac:  normal S1, S2; RRR; no murmur  Lungs:  clear to auscultation bilaterally, no wheezing, rhonchi or rales  Abd: soft, nontender, no hepatomegaly  Ext: no edema Musculoskeletal:  No deformities, BUE and BLE strength normal and equal Skin: warm and dry  Neuro:  CNs 2-12 intact, no focal abnormalities noted Psych:  Normal affect   EKG review:   07/31/24: NSR 80, PR 180, QRS 92, QT/c 408/470, minimal ST depressions in V4-V6 07/26/21: NSR 75, PR 170, QRS 92, QT/c 384/428   Telemetry:  Telemetry was personally reviewed and demonstrates: NSR 70-80s   Relevant CV Studies: TTE pending   Laboratory Data:  Chemistry Recent Labs  Lab 07/28/24 0909 07/31/24 2213  NA 137 137  K 4.0 4.8  CL 106 109  CO2 23 22  GLUCOSE 164* 105*  BUN  17 17  CREATININE 0.88 0.79  CALCIUM  8.9 8.6*  GFRNONAA >60 >60  ANIONGAP 8 6    Recent Labs  Lab 07/28/24 0909 07/31/24 2213  PROT 6.7 5.8*  ALBUMIN 3.6 3.1*  AST 28 30  ALT 14 12  ALKPHOS 62 50  BILITOT 0.5 0.4   Hematology Recent Labs  Lab 07/28/24 0909 07/31/24 2213 08/01/24 0407  WBC 4.0 3.3* 3.1*  RBC 3.06* 2.93* 2.93*  HGB 9.3* 9.0* 9.2*  HCT 29.9* 29.3* 29.1*  MCV 97.7 100.0 99.3  MCH 30.4 30.7 31.4  MCHC 31.1 30.7 31.6  RDW 16.5* 16.4* 16.5*  PLT 89* 101* 100*   BNP Recent Labs  Lab 07/31/24 2213  PROBNP 5,785.0*   TIMI Risk Score for Unstable Angina or Non-ST Elevation MI:   The patient's TIMI risk score is 5, which indicates a 26% risk of all cause mortality, new or recurrent myocardial infarction or need for urgent revascularization in the next 14 days.{  Assessment and Plan:  Joshua Nelson is a 73 y.o. 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   NSTEMI  CAD with prior MI Prior PCI  He is currently chest pain-free. His hsT were moderately elevated.  We  discussed different options for management of his CAD and NSTEMI.  He currently is asymptomatic and has chronic anemia and thrombocytopenia and is undergoing active palliative chemotherapy for metastatic rectal cancer.  I would like to avoid putting him through another procedure with his cancer having a 15% 5-year survival.  He did say however that he has had good response to the current chemo regimen and that his oncologist said that he may have 3-6 years or longer.  I messaged Dr. Ezzard to get his input the disease trajectory and whether we should be deferring invasive evaluation if he is otherwise asymptomatic at this point. - continue ASA 81 mg daily  - add ticagrelor  90 mg bid (he would not be a CABG candidate either way with his comorbidities)  - continue heparin  gtt for 48 hours and/or continue until coronary evaluation if we decide to go that route, right now he is ok with pursuing medical management of NSTEMI  - continue lisinopril  10 mg daily - continue toprol  XL 50 mg daily  - TTE pending to assess LVEF  HTN HLD - continue crestor  40 mg at bedtime   SOB Low grade fever Infiltrate on CT - clinically not having sx of PNA at this time - currently on abx with low grade fever and infiltrate on CT   Stage IV rectal cancer S/p rectal radiation  Active chemotx (FOLFOX/Avastin ) - sent Dr. Ezzard a message as above   Signed, Donnice DELENA Primus, MD  08/01/2024 10:35 AM     [1] No Known Allergies  "

## 2024-08-01 NOTE — Progress Notes (Signed)
*  PRELIMINARY RESULTS* Echocardiogram 2D Echocardiogram has been performed with Definity .  Teresa Aida PARAS 08/01/2024, 4:46 PM

## 2024-08-01 NOTE — Progress Notes (Signed)
 35: Spoke with pt family via phone call (family contact information in chart).

## 2024-08-01 NOTE — Progress Notes (Addendum)
 "  Joshua Nelson  FMW:[id number] DOB: April 06, 1951 DOA: 07/31/2024 PCP: Sherre Clapper, MD    Brief Narrative:  73 year old with a history of CAD status post PCI, HTN, HLD, DM2 with peripheral neuropathy, OSA, BPH, chronic anemia and thrombocytopenia, and stage IV rectal cancer with liver mets on palliative chemotherapy who presented to [facility] 12/17 with approximately 1 week of intermittent left-sided chest pressure associated with exertional dyspnea.  EKG at presentation at [facility] noted sinus tachycardia with nonspecific ST and T wave changes in the lateral leads.  CTa chest was negative for aortic dissection aneurysm or PE.  Cardiology evaluated the patient at [facility] and felt his presentation was consistent with NSTEMI.  His case was discussed with the on-call cardiologist at [facility] and the decision was made to transfer him to [facility].  Goals of Care:   Code Status: Full Code   DVT prophylaxis: IV heparin     Interim Hx: Afebrile.  Blood pressure mildly elevated with systolics 157-168.  Vitals otherwise stable. Resting comfortably in bed w/ no new complaints at the time of my visit. Appears patient will have to wait until 12/22 for his cardiac cath.   Assessment & Plan:  NSTEMI. -History of CAD status post multiple stents Care per cardiology - waiting [facility]  Community-acquired pneumonia CT chest obtained at [facility] suggested patchy ground glass densities in both lungs suggestive of pulmonary edema versus a pneumonia -patient reportedly did have a fever on presentation to [facility] - RVP negative - continue empiric antibiotic coverage for now  Acute systolic CHF EF 40-45% via TTE at [facility] - likely ischemic in nature - no gross overload on exam presently   Acute hypoxic respiratory failure O2 has improved nicely with low level O2 support   Stage IV rectal cancer with liver mets Currently on palliative chemotherapy with  FOLFOX/Avastin   Chronic anemia and thrombocytopenia Due to above -counts appear stable at present  HTN Adjust medical tx and follow   HLD Continue ezetimibe  and rosuvastatin   DM2 with peripheral neuropathy CBG well controlled at present - does not appear to be on meds at home - check A1c   Family Communication: no family present  Disposition:  awaiting [facility]   Objective: Blood pressure (!) 157/82, pulse 84, temperature 97.8 F (36.6 C), temperature source Oral, resp. rate 20, height 5' 9 (1.753 m), weight 78.9 kg, SpO2 97%.  Intake/Output Summary (Last 24 hours) at 08/01/2024 0832 Last data filed at 08/01/2024 0313 Gross per 24 hour  Intake --  Output 700 ml  Net -700 ml   Filed Weights   07/31/24 2300  Weight: 78.9 kg    Examination: General: No acute respiratory distress Lungs: Clear to auscultation bilaterally without wheezes or crackles Cardiovascular: Regular rate and rhythm without murmur gallop or rub normal S1 and S2 Abdomen: Nontender, nondistended, soft, bowel sounds positive, no rebound, no ascites, no appreciable mass Extremities: No significant cyanosis, clubbing, or edema bilateral lower extremities  CBC: Recent Labs  Lab 07/28/24 0909 07/31/24 2213 08/01/24 0407  WBC 4.0 3.3* 3.1*  NEUTROABS 2.7  --   --   HGB 9.3* 9.0* 9.2*  HCT 29.9* 29.3* 29.1*  MCV 97.7 100.0 99.3  PLT 89* 101* 100*   Basic Metabolic Panel: Recent Labs  Lab 07/28/24 0909 07/31/24 2213  NA 137 137  K 4.0 4.8  CL 106 109  CO2 23 22  GLUCOSE 164* 105*  BUN 17 17  CREATININE 0.88 0.79  CALCIUM  8.9  8.6*   GFR: Estimated Creatinine Clearance: 84.7 mL/min (by C-G formula based on SCr of 0.79 mg/dL).   Scheduled Meds:  aspirin  EC  81 mg Oral Daily   ezetimibe   10 mg Oral Daily   gabapentin   300 mg Oral TID   insulin  aspart  0-9 Units Subcutaneous Q4H   lisinopril   10 mg Oral Daily   metoprolol  succinate  25 mg Oral q morning   rosuvastatin   40 mg Oral QHS    Continuous Infusions:  azithromycin      ceFEPime  (MAXIPIME ) IV 2 g (08/01/24 0642)   heparin  1,700 Units/hr (08/01/24 0432)     LOS: 1 day   Reyes IVAR Moores, MD [facility] Office  [phone] Pager - Text Page per Tracey  If 7PM-7AM, please contact night-coverage per Amion 08/01/2024, 8:32 AM     "

## 2024-08-02 DIAGNOSIS — I214 Non-ST elevation (NSTEMI) myocardial infarction: Secondary | ICD-10-CM | POA: Diagnosis not present

## 2024-08-02 LAB — MAGNESIUM: Magnesium: 2 mg/dL (ref 1.7–2.4)

## 2024-08-02 LAB — GLUCOSE, CAPILLARY
Glucose-Capillary: 102 mg/dL — ABNORMAL HIGH (ref 70–99)
Glucose-Capillary: 104 mg/dL — ABNORMAL HIGH (ref 70–99)
Glucose-Capillary: 101 mg/dL — ABNORMAL HIGH (ref 70–99)
Glucose-Capillary: 92 mg/dL (ref 70–99)
Glucose-Capillary: 178 mg/dL — ABNORMAL HIGH (ref 70–99)
Glucose-Capillary: 138 mg/dL — ABNORMAL HIGH (ref 70–99)
Glucose-Capillary: 135 mg/dL — ABNORMAL HIGH (ref 70–99)

## 2024-08-02 LAB — PROCALCITONIN: Procalcitonin: 0.16 ng/mL

## 2024-08-02 LAB — BASIC METABOLIC PANEL WITH GFR
Anion gap: 8 (ref 5–15)
BUN: 15 mg/dL (ref 8–23)
CO2: 21 mmol/L — ABNORMAL LOW (ref 22–32)
Calcium: 8.4 mg/dL — ABNORMAL LOW (ref 8.9–10.3)
Chloride: 109 mmol/L (ref 98–111)
Creatinine, Ser: 0.6 mg/dL — ABNORMAL LOW (ref 0.61–1.24)
GFR, Estimated: >60 mL/min
Glucose, Bld: 96 mg/dL (ref 70–99)
Potassium: 4.4 mmol/L (ref 3.5–5.1)
Sodium: 138 mmol/L (ref 135–145)

## 2024-08-02 LAB — HEPARIN LEVEL (UNFRACTIONATED)
Heparin Unfractionated: >1.1 [IU]/mL — ABNORMAL HIGH (ref 0.30–0.70)
Heparin Unfractionated: >1.1 [IU]/mL — ABNORMAL HIGH (ref 0.30–0.70)
Heparin Unfractionated: 0.29 [IU]/mL — ABNORMAL LOW (ref 0.30–0.70)

## 2024-08-02 LAB — HEMOGLOBIN A1C
Hgb A1c MFr Bld: 5.3 % (ref 4.8–5.6)
Mean Plasma Glucose: 105.41 mg/dL

## 2024-08-02 MED ORDER — LISINOPRIL 10 MG PO TABS: 10.0000 mg | ORAL_TABLET | Freq: Every day | ORAL | Status: DC

## 2024-08-02 MED ORDER — HEPARIN (PORCINE) 25000 UT/250ML-% IV SOLN
1700.0000 [IU]/h | INTRAVENOUS | Status: DC
  Administered 2024-08-02 (×2): 1600 [IU]/h via INTRAVENOUS
  Administered 2024-08-03: 1700 [IU]/h via INTRAVENOUS
  Filled 2024-08-02 (×2): qty 250

## 2024-08-02 MED ORDER — FUROSEMIDE 10 MG/ML IJ SOLN
40.0000 mg | Freq: Once | INTRAMUSCULAR | Status: AC
  Administered 2024-08-02: 40 mg via INTRAVENOUS
  Filled 2024-08-02: qty 4

## 2024-08-02 MED ORDER — SPIRONOLACTONE 25 MG PO TABS
25.0000 mg | ORAL_TABLET | Freq: Every day | ORAL | Status: DC
  Administered 2024-08-02 – 2024-08-04 (×3): 25 mg via ORAL
  Filled 2024-08-02 (×3): qty 1

## 2024-08-02 MED ORDER — DAPAGLIFLOZIN PROPANEDIOL 10 MG PO TABS
10.0000 mg | ORAL_TABLET | Freq: Every day | ORAL | Status: DC
  Administered 2024-08-02 – 2024-08-04 (×3): 10 mg via ORAL
  Filled 2024-08-02 (×3): qty 1

## 2024-08-02 NOTE — Progress Notes (Addendum)
 Addendum Assessment: Repeat heparin  level remains supratherapeutic at >1.10. CBC stable and no signs of bleeding reported.  Plan:  -Hold heparin  infusion for 1 hour, then -Restart heparin  at a decreased rate of 1600 units/hr (decreased ~2.5 units/kg) -Recheck heparin  level after 8 hours to confirm therapeutic -Monitor daily heparin  levels and CBC -Monitor for any signs/symptoms of bleeding  Thank you for allowing pharmacy to be involved with this patient's care.  Mendel Barter, PharmD PGY1 Clinical Pharmacist [facility]  08/02/2024 8:43 AM   PHARMACY - ANTICOAGULATION CONSULT NOTE  Pharmacy Consult for Heparin  Indication: chest pain/ACS  Allergies[1]  Patient Measurements: Height: 5' 9 (175.3 cm) Weight: 78.9 kg (173 lb 15.1 oz) IBW/kg (Calculated) : 70.7 HEPARIN  DW (KG): 78.9  Vital Signs: Temp: 97.6 F (36.4 C) (12/21 0523) Temp Source: Oral (12/21 0523)  Labs: Recent Labs    07/31/24 2213 08/01/24 0407 08/01/24 0500 08/01/24 1331 08/01/24 2313 08/02/24 0435  HGB 9.0* 9.2*  --   --  9.2*  --   HCT 29.3* 29.1*  --   --  28.6*  --   PLT 101* 100*  --   --  101*  --   HEPARINUNFRC 0.29*  --    < > 0.28* 0.40 >1.10*  CREATININE 0.79  --   --   --  0.60*  --    < > = values in this interval not displayed.    Estimated Creatinine Clearance: 84.7 mL/min (A) (by C-G formula based on SCr of 0.6 mg/dL (L)).  Assessment: 73 yo male transferred from [facility] for evaluation of chest pain. Patient was started on a heparin  drip on 12/17 at 1600 units/hr. Most recent CBC: Hgb 9.7, Plts 97 (on chemo). Per RN, no signs of bleeding reported.  Heparin  level >1.10 is supratherapeutic with heparin  running at 1800 units/hr. Hgb (9.2) and PLTs (100) are on the lower side but have remained stable. Patient renal function is stable. Per RN, no report of pauses, issues with the line, or signs of bleeding.   Patient has been therapeutic or slightly  subtherapeutic on heparin  since starting infusion on 12/17. Since this is the first supratherapeutic level and it is an outlier compared to previous levels without a major rate change, a stat redraw has been ordered to confirm accuracy of the level.   Goal of Therapy:  Heparin  level 0.3-0.7 units/ml Monitor platelets by anticoagulation protocol: Yes   Plan:  A stat redraw heparin  level has been ordered Adjust heparin  infusion following results of new level Monitor daily heparin  levels and CBC Monitor for any signs/symptoms of bleeding  Thank you for allowing pharmacy to be involved with this patient's care.  Mendel Barter, PharmD PGY1 Clinical Pharmacist [facility]  08/02/2024 7:52 AM     [1] No Known Allergies

## 2024-08-02 NOTE — Progress Notes (Signed)
 Back  Joshua Nelson  FMW:[id number] DOB: 1951/06/02 DOA: 07/31/2024 PCP: Sherre Clapper, MD    Brief Narrative:  73 year old with a history of CAD status post PCI, HTN, HLD, DM2 with peripheral neuropathy, OSA, BPH, chronic anemia and thrombocytopenia, and stage IV rectal cancer with liver mets on palliative chemotherapy who presented to [facility] 12/17 with approximately 1 week of intermittent left-sided chest pressure associated with exertional dyspnea.  EKG at presentation at [facility] noted sinus tachycardia with nonspecific ST and T wave changes in the lateral leads.  CTa chest was negative for aortic dissection aneurysm or PE.  Cardiology evaluated the patient at [facility] and felt his presentation was consistent with NSTEMI.  His case was discussed with the on-call cardiologist at [facility] and the decision was made to transfer him to [facility]  Goals of Care:   Code Status: Full Code   DVT prophylaxis: IV heparin     Interim Hx: No acute events recorded overnight.  Afebrile.  Blood pressure elevated with systolic 160-178.  Resting comfortably in bed.  Has no new complaints.  Understands the plan for cardiac cath tomorrow and is in agreement.  Assessment & Plan:  NSTEMI. -History of CAD status post multiple stents Care per Cardiology - awaiting [facility] planned for 12/22  Community-acquired pneumonia CT chest obtained at [facility] reportedly suggested patchy ground glass densities in both lungs suggestive of pulmonary edema versus a pneumonia - patient reportedly did have a fever on presentation to [facility] - RVP negative -procalcitonin unimpressive 12/20 -clinically no persisting symptoms to suggest active pulmonary infection -including his time at [facility] the patient will have completed 5 days of antibiotic therapy at the end of the day today - will discontinue antibiotics thereafter and follow clinically  Acute combined systolic and diastolic CHF EF 59-54% via  TTE at [facility] - TTE at [facility] 08/01/24 noted EF 30-35% with grade 2 DD with both global and regional WMA - likely ischemic in nature - no gross overload on exam presently - net negative approximately 2 L for this admission thus far  [facility] Weights   07/31/24 2300  Weight: 78.9 kg     Acute hypoxic respiratory failure O2 has improved nicely with low level O2 support   Stage IV rectal cancer with liver mets Currently on palliative chemotherapy with FOLFOX/Avastin   Chronic anemia and thrombocytopenia Due to above - counts appear stable at present  HTN Adjust medical tx again today and follow  HLD Continue ezetimibe  and rosuvastatin   DM2 with peripheral neuropathy CBG well controlled at present - does not appear to be on meds at home -A1c 5.3   Family Communication:  spoke with friends visiting at bedside Disposition:  awaiting [facility] 12/22   Objective: Blood pressure (!) 178/105, pulse 98, temperature 98.2 F (36.8 C), temperature source Oral, resp. rate 20, height 5' 9 (1.753 m), weight 78.9 kg, SpO2 94%.  Intake/Output Summary (Last 24 hours) at 08/02/2024 0815 Last data filed at 08/02/2024 9479 Gross per 24 hour  Intake 935.15 ml  Output 2250 ml  Net -1314.85 ml   Filed Weights   07/31/24 2300  Weight: 78.9 kg    Examination: General: No acute respiratory distress Lungs: Clear to auscultation bilaterally without wheezes or crackles Cardiovascular: Regular rate and rhythm without murmur gallop or rub normal S1 and S2 Abdomen: Nontender, nondistended, soft, bowel sounds positive, no rebound, no ascites, no appreciable mass Extremities: No significant cyanosis, clubbing, or edema bilateral lower extremities  CBC: Recent  Labs  Lab 07/28/24 0909 07/31/24 2213 08/01/24 0407 08/01/24 2313  WBC 4.0 3.3* 3.1* 3.5*  NEUTROABS 2.7  --   --   --   HGB 9.3* 9.0* 9.2* 9.2*  HCT 29.9* 29.3* 29.1* 28.6*  MCV 97.7 100.0 99.3 97.3  PLT 89* 101* 100* 101*    Basic Metabolic Panel: Recent Labs  Lab 07/28/24 0909 07/31/24 2213 08/01/24 2313  NA 137 137 138  K 4.0 4.8 4.4  CL 106 109 109  CO2 23 22 21*  GLUCOSE 164* 105* 96  BUN 17 17 15   CREATININE 0.88 0.79 0.60*  CALCIUM  8.9 8.6* 8.4*  MG  --   --  2.0   GFR: Estimated Creatinine Clearance: 84.7 mL/min (A) (by C-G formula based on SCr of 0.6 mg/dL (L)).   Scheduled Meds:  aspirin  EC  81 mg Oral Daily   ezetimibe   10 mg Oral Daily   gabapentin   300 mg Oral TID   insulin  aspart  0-9 Units Subcutaneous Q4H   metoprolol  succinate  50 mg Oral q morning   rosuvastatin   40 mg Oral QHS   spironolactone   25 mg Oral Daily   ticagrelor   90 mg Oral BID   Continuous Infusions:  azithromycin  500 mg (08/01/24 1308)   ceFEPime  (MAXIPIME ) IV 2 g (08/02/24 0651)   heparin  1,800 Units/hr (08/02/24 0351)     LOS: 2 days   Reyes IVAR Moores, MD [facility] Office  [phone] Pager - Text Page per Tracey  If 7PM-7AM, please contact night-coverage per Amion 08/02/2024, 8:15 AM

## 2024-08-02 NOTE — Progress Notes (Signed)
 " Cardiology Consultation:   Patient ID: Joshua Nelson MRN: [id number]; DOB: 1950/10/31  Admit date: 07/31/2024 Date of Consult: 08/02/2024  Primary Care Provider: Sherre Clapper, MD [facility] Cardiologist: None  [facility] Electrophysiologist:  None   Patient Profile:   Joshua Nelson is a 73 y.o. 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   Interval Events:   No recurrent chest pain since admission.  TTE with moderately to severely reduced LVEF 30-35% which was normal several years ago.  Remains on 1-2 L supplemental O2.  Started on Spiriva 25 mg daily this morning and gave ticagrelor  90 mg last night as he would not be a CABG candidate either way with his metastatic rectal cancer.  Had discontinued lisinopril  with anticipation of starting Entresto .  Toprol -XL 25 mg daily ordered for today.  Past Medical History:  Diagnosis Date   Acute CHF (HCC) 07/31/2024   Arthritis    BMI 27.0-27.9,adult 06/09/2023   BPH with obstruction/lower urinary tract symptoms 08/01/2021   Chronic left shoulder pain 11/30/2019   Diabetes mellitus without complication (HCC)    Diabetic polyneuropathy associated with type 2 diabetes mellitus (HCC) 11/30/2019   Encounter for central line care 01/02/2024   Encounter for immunization 06/09/2023   Hypertension    Hypertensive heart disease without heart failure 11/30/2019   Insomnia    Mixed hyperlipidemia 11/30/2019   Myocardial infarct [facility])    Obstructive sleep apnea 04/03/2018   PONV (postoperative nausea and vomiting)    Prostate cancer (HCC) 07/2021   Rectal bleeding 09/21/2023   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) 10/30/2023   Urge incontinence 07/12/2022   Vertigo 01/19/2024   Past Surgical History:  Procedure Laterality Date   CARDIAC CATHETERIZATION     THREE CARDIAC STENTS  PLACED   COLONOSCOPY W/ POLYPECTOMY     CYSTOSCOPY WITH URETHRAL DILATATION N/A 08/01/2021   Procedure: CYSTOSCOPY WITH BALLOON URETHRAL DILATATION;  Surgeon: Elisabeth Valli BIRCH, MD;  Location: [facility];  Service: Urology;  Laterality: N/A;   left cataract  10/2019   NOSE SURGERY     RESULT OF DOG BITE DURING CHILDHOOD   PROSTATE BIOPSY     TRANSURETHRAL RESECTION OF BLADDER TUMOR N/A 08/01/2021   Procedure: TRANSURETHRAL RESECTION OF PROSTATE;  Surgeon: Elisabeth Valli BIRCH, MD;  Location: [facility];  Service: Urology;  Laterality: N/A;  90 MINS   URETHRAL STRICTURE DILATATION      Inpatient Medications: Scheduled Meds:  aspirin  EC  81 mg Oral Daily   dapagliflozin  propanediol  10 mg Oral Daily   ezetimibe   10 mg Oral Daily   gabapentin   300 mg Oral TID   lisinopril   10 mg Oral Daily   metoprolol  succinate  50 mg Oral q morning   rosuvastatin   40 mg Oral QHS   spironolactone   25 mg Oral Daily   ticagrelor   90 mg Oral BID   Continuous Infusions:  azithromycin  500 mg (08/01/24 1308)   ceFEPime  (MAXIPIME ) IV 2 g (08/02/24 0651)   heparin  Stopped (08/02/24 0845)   Allergies:   Allergies[1]  Social History:   Social History   Socioeconomic History   Marital status: Married    Spouse name: Nathanel   Number of children: 1   Years of education: 12   Highest education level: 12th grade  Occupational History   Occupation:  RETIRED - TRUCK DRIVER  Tobacco Use   Smoking status: Former    Current packs/day: 0.00    Types: Cigarettes    Quit date: 2003    Years since quitting: 22.9   Smokeless tobacco: Never  Vaping Use   Vaping status: Never Used  Substance and Sexual Activity   Alcohol use: Not Currently    Comment: none since 2000   Drug use: Never   Sexual activity: Not Currently  Other Topics Concern   Not on file  Social History Narrative   Lives at home with his wife.   2 cups caffeine per day.   Right-handed.   Social Drivers of Health   Tobacco Use: Medium Risk  (08/01/2024)   Patient History    Smoking Tobacco Use: Former    Smokeless Tobacco Use: Never    Passive Exposure: Not on file  Financial Resource Strain: Low Risk (04/30/2024)   Overall Financial Resource Strain (CARDIA)    Difficulty of Paying Living Expenses: Not hard at all  Food Insecurity: No Food Insecurity (08/01/2024)   Epic    Worried About Programme Researcher, Broadcasting/film/video in the Last Year: Never true    Ran Out of Food in the Last Year: Never true  Transportation Needs: No Transportation Needs (08/01/2024)   Epic    Lack of Transportation (Medical): No    Lack of Transportation (Non-Medical): No  Physical Activity: Inactive (04/30/2024)   Exercise Vital Sign    Days of Exercise per Week: 0 days    Minutes of Exercise per Session: 0 min  Stress: No Stress Concern Present (04/30/2024)   [employer] of Occupational Health - Occupational Stress Questionnaire    Feeling of Stress: Only a little  Social Connections: Socially Integrated (08/01/2024)   Social Connection and Isolation Panel    Frequency of Communication with Friends and Family: More than three times a week    Frequency of Social Gatherings with Friends and Family: More than three times a week    Attends Religious Services: More than 4 times per year    Active Member of Clubs or Organizations: Yes    Attends Banker Meetings: More than 4 times per year    Marital Status: Married  Catering Manager Violence: Not At Risk (08/01/2024)   Epic    Fear of Current or Ex-Partner: No    Emotionally Abused: No    Physically Abused: No    Sexually Abused: No  Depression (PHQ2-9): Low Risk (06/30/2024)   Depression (PHQ2-9)    PHQ-2 Score: 0  Alcohol Screen: Low Risk (04/30/2024)   Alcohol Screen    Last Alcohol Screening Score (AUDIT): 0  Housing: Low Risk (08/01/2024)   Epic    Unable to Pay for Housing in the Last Year: No    Number of Times Moved in the Last Year: 0    Homeless in the Last Year: No   Utilities: Not At Risk (08/01/2024)   Epic    Threatened with loss of utilities: No  Health Literacy: Adequate Health Literacy (04/30/2024)   B1300 Health Literacy    Frequency of need for help with medical instructions: Never    Family History:   Family History  Problem Relation Age of Onset   Endometrial cancer Mother    CAD Mother    AAA (abdominal aortic aneurysm) Mother    Dementia Father    Anxiety disorder Father    Depression Father    Heart disease Father  Hypertension Father    Deep vein thrombosis Father    Kidney Stones Sister        H/O : INTESTINAL BLOCKAGE   Barrett's esophagus Sister    Hyperlipidemia Sister    Kidney Stones Brother    Heart attack Brother    Colon cancer Neg Hx    Esophageal cancer Neg Hx    Liver cancer Neg Hx    Stomach cancer Neg Hx     ROS:  Review of Systems: [y] = yes, [ ]  = no      Joshua: Weight gain [ ] ; Weight loss [ ] ; Anorexia [ ] ; Fatigue [ ] ; Fever [ ] ; Chills [ ] ; Weakness [ ]    Cardiac: Chest pain/pressure [ ] ; Resting SOB [ ] ; Exertional SOB [ ] ; Orthopnea [ ] ; Pedal Edema [ ] ; Palpitations [ ] ; Syncope [ ] ; Presyncope [ ] ; Paroxysmal nocturnal dyspnea [ ]    Pulmonary: Cough [ ] ; Wheezing [ ] ; Hemoptysis [ ] ; Sputum [ ] ; Snoring [ ]    GI: Vomiting [ ] ; Dysphagia [ ] ; Melena [ ] ; Hematochezia [ ] ; Heartburn [ ] ; Abdominal pain [ ] ; Constipation [ ] ; Diarrhea [ ] ; BRBPR [ ]    GU: Hematuria [ ] ; Dysuria [ ] ; Nocturia [ ]  Vascular: Pain in legs with walking [ ] ; Pain in feet with lying flat [ ] ; Non-healing sores [ ] ; Stroke [ ] ; TIA [ ] ; Slurred speech [ ] ;   Neuro: Headaches [ ] ; Vertigo [ ] ; Seizures [ ] ; Paresthesias [ ] ;Blurred vision [ ] ; Diplopia [ ] ; Vision changes [ ]    Ortho/Skin: Arthritis [ ] ; Joint pain [ ] ; Muscle pain [ ] ; Joint swelling [ ] ; Back Pain [ ] ; Rash [ ]    Psych: Depression [ ] ; Anxiety [ ]    Heme: Bleeding problems [ ] ; Clotting disorders [ ] ; Anemia [ ]    Endocrine: Diabetes [ ] ; Thyroid   dysfunction [ ]    Physical Exam/Data:   Vitals:   08/02/24 0141 08/02/24 0523 08/02/24 0755 08/02/24 0835  BP:   (!) 178/105 (!) 158/77  Pulse:   98 87  Resp: 20  20 16   Temp: 98.2 F (36.8 C) 97.6 F (36.4 C) 98.2 F (36.8 C)   TempSrc: Oral Oral Oral   SpO2:   94% 96%  Weight:      Height:          07/31/2024   11:00 PM 07/28/2024    9:45 AM 07/16/2024    3:05 PM  Last 3 Weights  Weight (lbs) 173 lb 15.1 oz 173 lb 14.4 oz 175 lb  Weight (kg) 78.9 kg 78.881 kg 79.379 kg     Body mass index is 25.69 kg/m.  Joshua:  Well nourished, well developed, in no acute distress HEENT: normal Vascular: No carotid bruits; FA pulses 2+ bilaterally without bruits  Cardiac:  normal S1, S2; RRR; no murmur  Lungs:  clear to auscultation bilaterally, no wheezing, rhonchi or rales  Abd: soft, nontender, no hepatomegaly  Ext: no edema Musculoskeletal:  No deformities, BUE and BLE strength normal and equal Skin: warm and dry  Neuro:  CNs 2-12 intact, no focal abnormalities noted Psych:  Normal affect    EKG: None today   Telemetry:  Telemetry was personally reviewed and demonstrates:  NSR 80-100s  Relevant CV Studies:  TTE Result date: 08/01/24   1. Global and regional wall motion abnormality.. Left ventricular  ejection fraction, by estimation, is 30 to 35%. The left ventricle  has  moderately decreased function. There is mild left ventricular hypertrophy.  Left ventricular diastolic parameters are   consistent with Grade II diastolic dysfunction (pseudonormalization).  Elevated left ventricular end-diastolic pressure. The E/e' is 15.7.   2. Right ventricular systolic function is normal. The right ventricular  size is normal. Mildly increased right ventricular wall thickness. There  is severely elevated pulmonary artery systolic pressure. The estimated  right ventricular systolic pressure  is 65.5 mmHg.   3. Left atrial size was severely dilated.   4. The mitral valve is  degenerative. Moderate mitral valve regurgitation.  No evidence of mitral stenosis.   5. Native aortic valve, moderately calcified, likely functional bicuspid  (fusion of noncoronary cusp and left coronary cusp). No regurgitation.  Mild low-flow low gradient aortic stenosis (peak velocity 1.8 m/s, mean  gradient 7 mmHg, AVA per VTI 1.6 cm,   dimensionless index 0.47, stroke-volume index 28).   6. The inferior vena cava is dilated in size with >50% respiratory  variability, suggesting right atrial pressure of 8 mmHg.   Laboratory Data:  Chemistry Recent Labs  Lab 07/28/24 0909 07/31/24 2213 08/01/24 2313  NA 137 137 138  K 4.0 4.8 4.4  CL 106 109 109  CO2 23 22 21*  GLUCOSE 164* 105* 96  BUN 17 17 15   CREATININE 0.88 0.79 0.60*  CALCIUM  8.9 8.6* 8.4*  GFRNONAA >60 >60 >60  ANIONGAP 8 6 8     Recent Labs  Lab 07/28/24 0909 07/31/24 2213  PROT 6.7 5.8*  ALBUMIN 3.6 3.1*  AST 28 30  ALT 14 12  ALKPHOS 62 50  BILITOT 0.5 0.4   Hematology Recent Labs  Lab 07/31/24 2213 08/01/24 0407 08/01/24 2313  WBC 3.3* 3.1* 3.5*  RBC 2.93* 2.93* 2.94*  HGB 9.0* 9.2* 9.2*  HCT 29.3* 29.1* 28.6*  MCV 100.0 99.3 97.3  MCH 30.7 31.4 31.3  MCHC 30.7 31.6 32.2  RDW 16.4* 16.5* 16.4*  PLT 101* 100* 101*   BNP Recent Labs  Lab 07/31/24 2213  PROBNP 5,785.0*    Radiology/Studies:  ECHOCARDIOGRAM COMPLETE Result Date: 08/01/2024    ECHOCARDIOGRAM REPORT   Patient Name:   Joshua Nelson Date of Exam: 08/01/2024 Medical Rec #:  [id number]      Height:       69.0 in Accession #:    [id number]     Weight:       173.9 lb Date of Birth:  1951-02-18      BSA:          1.947 m Patient Age:    73 years       BP:           157/82 mmHg Patient Gender: M              HR:           84 bpm. Exam Location:  Inpatient Procedure: 2D Echo, Cardiac Doppler, Color Doppler and Intracardiac            Opacification Agent (Both Spectral and Color Flow Doppler were            utilized during procedure).  Indications:    NSTEMI I21.4  History:        Patient has no prior history of Echocardiogram examinations.                 CHF, CAD and Previous Myocardial Infarction; Risk  Factors:Hypertension, Diabetes and Dyslipidemia.  Sonographer:    Aida Pizza RCS Referring Phys: [id number] MOIZ A NASIR IMPRESSIONS  1. Global and regional wall motion abnormality.. Left ventricular ejection fraction, by estimation, is 30 to 35%. The left ventricle has moderately decreased function. There is mild left ventricular hypertrophy. Left ventricular diastolic parameters are  consistent with Grade II diastolic dysfunction (pseudonormalization). Elevated left ventricular end-diastolic pressure. The E/e' is 15.7.  2. Right ventricular systolic function is normal. The right ventricular size is normal. Mildly increased right ventricular wall thickness. There is severely elevated pulmonary artery systolic pressure. The estimated right ventricular systolic pressure is 65.5 mmHg.  3. Left atrial size was severely dilated.  4. The mitral valve is degenerative. Moderate mitral valve regurgitation. No evidence of mitral stenosis.  5. Native aortic valve, moderately calcified, likely functional bicuspid (fusion of noncoronary cusp and left coronary cusp). No regurgitation. Mild low-flow low gradient aortic stenosis (peak velocity 1.8 m/s, mean gradient 7 mmHg, AVA per VTI 1.6 cm,  dimensionless index 0.47, stroke-volume index 28).  6. The inferior vena cava is dilated in size with >50% respiratory variability, suggesting right atrial pressure of 8 mmHg. Comparison(s): No prior Echocardiogram. FINDINGS  Left Ventricle: Global and regional wall motion abnormality. Left ventricular ejection fraction, by estimation, is 30 to 35%. The left ventricle has moderately decreased function. Definity  contrast agent was given IV to delineate the left ventricular endocardial borders. The left ventricular internal cavity size was normal in size.  There is mild left ventricular hypertrophy. Left ventricular diastolic parameters are consistent with Grade II diastolic dysfunction (pseudonormalization). Elevated left ventricular end-diastolic pressure. The E/e' is 15.7.  LV Wall Scoring: The entire inferior wall, basal anteroseptal segment, apical lateral segment, mid inferoseptal segment, and basal inferoseptal segment are hypokinetic. The entire anterior wall, antero-lateral wall, mid and distal anterior septum, posterior wall, and apex are normal. Right Ventricle: The right ventricular size is normal. Mildly increased right ventricular wall thickness. Right ventricular systolic function is normal. There is severely elevated pulmonary artery systolic pressure. The tricuspid regurgitant velocity is 3.79 m/s, and with an assumed right atrial pressure of 8 mmHg, the estimated right ventricular systolic pressure is 65.5 mmHg. Left Atrium: Left atrial size was severely dilated. Right Atrium: Right atrial size was normal in size. Pericardium: Trivial pericardial effusion is present. The pericardial effusion is posterior to the left ventricle. Mitral Valve: The mitral valve is degenerative in appearance. Mild to moderate mitral annular calcification. Moderate mitral valve regurgitation. No evidence of mitral valve stenosis. Tricuspid Valve: The tricuspid valve is grossly normal. Tricuspid valve regurgitation is mild . No evidence of tricuspid stenosis. Aortic Valve: Native aortic valve, moderately calcified, likely functional bicuspid (fusion of noncoronary cusp and left coronary cusp). No regurgitation. Mild low-flow low gradient aortic stenosis (peak velocity 1.8 m/s, mean gradient 7 mmHg, AVA per VTI 1.6 cm, dimensionless index 0.47, stroke-volume index 28). Aortic valve mean gradient measures 7.0 mmHg. Aortic valve peak gradient measures 12.9 mmHg. Aortic valve area, by VTI measures 1.64 cm. Pulmonic Valve: The pulmonic valve was grossly normal. Pulmonic valve  regurgitation is not visualized. Aorta: The aortic root is normal in size and structure. Venous: The inferior vena cava is dilated in size with greater than 50% respiratory variability, suggesting right atrial pressure of 8 mmHg. IAS/Shunts: The atrial septum is grossly normal.  LEFT VENTRICLE PLAX 2D LVIDd:         5.60 cm      Diastology LVIDs:  5.10 cm      LV e' medial:    6.20 cm/s LV PW:         1.30 cm      LV E/e' medial:  18.5 LV IVS:        1.30 cm      LV e' lateral:   8.92 cm/s LVOT diam:     2.10 cm      LV E/e' lateral: 12.9 LV SV:         55 LV SV Index:   28 LVOT Area:     3.46 cm  LV Volumes (MOD) LV vol d, MOD A2C: 137.0 ml LV vol d, MOD A4C: 154.0 ml LV vol s, MOD A2C: 102.0 ml LV vol s, MOD A4C: 104.0 ml LV SV MOD A2C:     35.0 ml LV SV MOD A4C:     154.0 ml LV SV MOD BP:      47.0 ml RIGHT VENTRICLE RV S prime:     13.40 cm/s TAPSE (M-mode): 1.9 cm LEFT ATRIUM              Index        RIGHT ATRIUM           Index LA diam:        3.50 cm  1.80 cm/m   RA Area:     19.10 cm LA Vol (A2C):   114.0 ml 58.55 ml/m  RA Volume:   54.50 ml  27.99 ml/m LA Vol (A4C):   98.9 ml  50.80 ml/m LA Biplane Vol: 117.0 ml 60.09 ml/m  AORTIC VALVE AV Area (Vmax):    1.56 cm AV Area (Vmean):   1.43 cm AV Area (VTI):     1.64 cm AV Vmax:           179.50 cm/s AV Vmean:          123.000 cm/s AV VTI:            0.333 m AV Peak Grad:      12.9 mmHg AV Mean Grad:      7.0 mmHg LVOT Vmax:         80.70 cm/s LVOT Vmean:        50.700 cm/s LVOT VTI:          0.158 m LVOT/AV VTI ratio: 0.47  AORTA Ao Root diam: 3.80 cm MITRAL VALVE                TRICUSPID VALVE MV Area (PHT): 7.44 cm     TV Peak grad:   57.5 mmHg MV Decel Time: 102 msec     TV Vmax:        3.79 m/s MV E velocity: 115.00 cm/s  TR Peak grad:   57.5 mmHg MV A velocity: 89.60 cm/s   TR Vmax:        379.00 cm/s MV E/A ratio:  1.28                             SHUNTS                             Systemic VTI:  0.16 m                              Systemic Diam: 2.10 cm Sunit Tolia  Electronically signed by Madonna Large Signature Date/Time: 08/01/2024/5:03:52 PM    Final    TIMI Risk Score for Unstable Angina or Non-ST Elevation MI:   The patient's TIMI risk score is 5, which indicates a 26% risk of all cause mortality, new or recurrent myocardial infarction or need for urgent revascularization in the next 14 days.   Assessment and Plan:  Joshua Nelson is a 73 y.o. 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    NSTEMI  CAD with prior MI Prior PCI  He is currently chest pain-free. His hsT were moderately elevated.  We discussed different options for management of his CAD and NSTEMI.  He currently is asymptomatic and has chronic anemia and thrombocytopenia and is undergoing active palliative chemotherapy for metastatic rectal cancer.  Initially we have plan for medical management of his NSTEMI his comorbidities however now with moderate to severely reduced LVEF I think it would be reasonable to consider coronary angiography and PCI.  He likely would be able to tolerate DAPT and this should not preclude his chemotherapy treatment.  I have messaged Dr. Ezzard to make sure that I understand the bigger picture with regards to his cancer trajectory and treatment regimen.  Ideally I would like to talk with him before we commit Joshua Nelson to possible PCI. - continue ASA 81 mg daily  - added ticagrelor  90 mg bid (he would not be a CABG candidate either way with his comorbidities)  - continue heparin  gtt until coronary evaluation - Discontinue lisinopril  10 mg daily with plans to start Entresto  once cath complete and creatinine stable. - Start Jardiance 10 mg daily with moderate-severely reduced LVEF - continue toprol  XL 50 mg daily  - lasix  40 mg IV today, try to get off O2 and ensure he can lay flat for  procedure  - TTE with moderate to severely reduced LVEF as above.   HTN HLD - continue crestor  40 mg at bedtime    SOB Low grade fever Infiltrate on CT - clinically not having sx of PNA at this time - currently on 7 days of abx with low grade fever and infiltrate on CT although my suspicion for actual pneumonia is low   Stage IV rectal cancer S/p rectal radiation  Active chemotx (FOLFOX/Avastin ) - sent Dr. Ezzard a message as above   Informed Consent   Shared Decision Making/Informed Consent{  The risks [stroke (1 in 1000), death (1 in 1000), kidney failure [usually temporary] (1 in 500), bleeding (1 in 200), allergic reaction [possibly serious] (1 in 200)], benefits (diagnostic support and management of coronary artery disease) and alternatives of a cardiac catheterization were discussed in detail with Joshua Nelson and he is willing to proceed.    For questions or updates, please contact [facility] Please consult [email or web address] for contact info under   Signed, Donnice DELENA Primus, MD  08/02/2024 8:54 AM     [1] No Known Allergies  "

## 2024-08-02 NOTE — Progress Notes (Signed)
 PHARMACY - ANTICOAGULATION CONSULT NOTE  Pharmacy Consult for Heparin  Indication: chest pain/ACS  Allergies[1]  Patient Measurements: Height: 5' 9 (175.3 cm) Weight: 78.9 kg (173 lb 15.1 oz) IBW/kg (Calculated) : 70.7 HEPARIN  DW (KG): 78.9  Vital Signs: Temp: 98.3 F (36.8 C) (12/21 1942) Temp Source: Oral (12/21 1942) BP: 152/79 (12/21 1942) Pulse Rate: 81 (12/21 1942)  Labs: Recent Labs    07/31/24 2213 08/01/24 0407 08/01/24 0500 08/01/24 2313 08/02/24 0435 08/02/24 0749 08/02/24 1923  HGB 9.0* 9.2*  --  9.2*  --   --   --   HCT 29.3* 29.1*  --  28.6*  --   --   --   PLT 101* 100*  --  101*  --   --   --   HEPARINUNFRC 0.29*  --    < > 0.40 >1.10* >1.10* 0.29*  CREATININE 0.79  --   --  0.60*  --   --   --    < > = values in this interval not displayed.    Estimated Creatinine Clearance: 84.7 mL/min (A) (by C-G formula based on SCr of 0.6 mg/dL (L)).  Assessment: 73 yo male transferred from [facility] for evaluation of chest pain. Patient was started on a heparin  drip on 12/17 at 1600 units/hr. Most recent CBC: Hgb 9.7, Plts 97 (on chemo). Per RN, no signs of bleeding reported.  Heparin  level 0.29 is bottom end of therapeutic with heparin  running at 1600 units/hr. Hgb (9.2) and PLTs (100) are on the lower side but have remained stable. Patient renal function is stable. Per RN, no report of pauses, issues with the line, or signs of bleeding.    Goal of Therapy:  Heparin  level 0.3-0.7 units/ml Monitor platelets by anticoagulation protocol: Yes   Plan:  Increase heparin  drip 1700 uuts/hr Daily heparin  level and CBC Monitor for any signs/symptoms of bleeding    Olam Chalk Pharm.D. CPP, BCPS Clinical Pharmacist [phone] 08/02/2024 8:55 PM        [1] No Known Allergies

## 2024-08-02 NOTE — Plan of Care (Signed)

## 2024-08-03 ENCOUNTER — Other Ambulatory Visit: Payer: Self-pay

## 2024-08-03 ENCOUNTER — Encounter: Payer: Self-pay | Admitting: Oncology

## 2024-08-03 ENCOUNTER — Inpatient Hospital Stay (HOSPITAL_COMMUNITY): Admission: EM | Disposition: A | Payer: Self-pay | Source: Other Acute Inpatient Hospital | Attending: Internal Medicine

## 2024-08-03 ENCOUNTER — Other Ambulatory Visit (HOSPITAL_COMMUNITY): Payer: Self-pay

## 2024-08-03 ENCOUNTER — Encounter (HOSPITAL_COMMUNITY): Payer: Self-pay | Admitting: Cardiovascular Disease

## 2024-08-03 ENCOUNTER — Telehealth (HOSPITAL_COMMUNITY): Payer: Self-pay | Admitting: Pharmacy Technician

## 2024-08-03 DIAGNOSIS — E785 Hyperlipidemia, unspecified: Secondary | ICD-10-CM | POA: Diagnosis not present

## 2024-08-03 DIAGNOSIS — I2510 Atherosclerotic heart disease of native coronary artery without angina pectoris: Secondary | ICD-10-CM

## 2024-08-03 DIAGNOSIS — I5021 Acute systolic (congestive) heart failure: Secondary | ICD-10-CM | POA: Diagnosis not present

## 2024-08-03 DIAGNOSIS — I214 Non-ST elevation (NSTEMI) myocardial infarction: Secondary | ICD-10-CM | POA: Diagnosis not present

## 2024-08-03 HISTORY — PX: RIGHT/LEFT HEART CATH AND CORONARY ANGIOGRAPHY: CATH118266

## 2024-08-03 HISTORY — PX: CORONARY STENT INTERVENTION: CATH118234

## 2024-08-03 LAB — POCT I-STAT EG7
Acid-Base Excess: 0 mmol/L (ref 0.0–2.0)
Acid-base deficit: 2 mmol/L (ref 0.0–2.0)
Bicarbonate: 23.6 mmol/L (ref 20.0–28.0)
Bicarbonate: 25.1 mmol/L (ref 20.0–28.0)
Calcium, Ion: 1.16 mmol/L (ref 1.15–1.40)
Calcium, Ion: 1.22 mmol/L (ref 1.15–1.40)
HCT: 26 % — ABNORMAL LOW (ref 39.0–52.0)
HCT: 26 % — ABNORMAL LOW (ref 39.0–52.0)
Hemoglobin: 8.8 g/dL — ABNORMAL LOW (ref 13.0–17.0)
Hemoglobin: 8.8 g/dL — ABNORMAL LOW (ref 13.0–17.0)
O2 Saturation: 56 %
O2 Saturation: 58 %
Potassium: 4 mmol/L (ref 3.5–5.1)
Potassium: 4.2 mmol/L (ref 3.5–5.1)
Sodium: 138 mmol/L (ref 135–145)
Sodium: 140 mmol/L (ref 135–145)
TCO2: 25 mmol/L (ref 22–32)
TCO2: 26 mmol/L (ref 22–32)
pCO2, Ven: 40.5 mmHg — ABNORMAL LOW (ref 44–60)
pCO2, Ven: 40.6 mmHg — ABNORMAL LOW (ref 44–60)
pH, Ven: 7.373 (ref 7.25–7.43)
pH, Ven: 7.401 (ref 7.25–7.43)
pO2, Ven: 30 mmHg — CL (ref 32–45)
pO2, Ven: 30 mmHg — CL (ref 32–45)

## 2024-08-03 LAB — GLUCOSE, CAPILLARY
Glucose-Capillary: 108 mg/dL — ABNORMAL HIGH (ref 70–99)
Glucose-Capillary: 86 mg/dL (ref 70–99)
Glucose-Capillary: 85 mg/dL (ref 70–99)
Glucose-Capillary: 102 mg/dL — ABNORMAL HIGH (ref 70–99)
Glucose-Capillary: 114 mg/dL — ABNORMAL HIGH (ref 70–99)

## 2024-08-03 LAB — CBC
HCT: 28.9 % — ABNORMAL LOW (ref 39.0–52.0)
Hemoglobin: 9.1 g/dL — ABNORMAL LOW (ref 13.0–17.0)
MCH: 31 pg (ref 26.0–34.0)
MCHC: 31.5 g/dL (ref 30.0–36.0)
MCV: 98.3 fL (ref 80.0–100.0)
Platelets: 120 K/uL — ABNORMAL LOW (ref 150–400)
RBC: 2.94 MIL/uL — ABNORMAL LOW (ref 4.22–5.81)
RDW: 16.4 % — ABNORMAL HIGH (ref 11.5–15.5)
WBC: 3.2 K/uL — ABNORMAL LOW (ref 4.0–10.5)
nRBC: 0 % (ref 0.0–0.2)

## 2024-08-03 LAB — POCT I-STAT 7, (LYTES, BLD GAS, ICA,H+H)
Acid-base deficit: 1 mmol/L (ref 0.0–2.0)
Bicarbonate: 22.8 mmol/L (ref 20.0–28.0)
Calcium, Ion: 1.14 mmol/L — ABNORMAL LOW (ref 1.15–1.40)
HCT: 25 % — ABNORMAL LOW (ref 39.0–52.0)
Hemoglobin: 8.5 g/dL — ABNORMAL LOW (ref 13.0–17.0)
O2 Saturation: 97 %
Potassium: 4 mmol/L (ref 3.5–5.1)
Sodium: 142 mmol/L (ref 135–145)
TCO2: 24 mmol/L (ref 22–32)
pCO2 arterial: 32.9 mmHg (ref 32–48)
pH, Arterial: 7.449 (ref 7.35–7.45)
pO2, Arterial: 90 mmHg (ref 83–108)

## 2024-08-03 LAB — POCT ACTIVATED CLOTTING TIME: Activated Clotting Time: 250 s

## 2024-08-03 LAB — HEPARIN LEVEL (UNFRACTIONATED): Heparin Unfractionated: 0.9 [IU]/mL — ABNORMAL HIGH (ref 0.30–0.70)

## 2024-08-03 MED ORDER — VERAPAMIL HCL 2.5 MG/ML IV SOLN
INTRAVENOUS | Status: DC | PRN
  Administered 2024-08-03: 10 mL via INTRA_ARTERIAL

## 2024-08-03 MED ORDER — SODIUM CHLORIDE 0.9% FLUSH
10.0000 mL | Freq: Two times a day (BID) | INTRAVENOUS | Status: DC
  Administered 2024-08-03 – 2024-08-04 (×3): 10 mL

## 2024-08-03 MED ORDER — SODIUM CHLORIDE 0.9 % IV SOLN: 250.0000 mL | INTRAVENOUS | Status: DC | PRN

## 2024-08-03 MED ORDER — TICAGRELOR 90 MG PO TABS
ORAL_TABLET | ORAL | Status: AC
  Filled 2024-08-03: qty 2

## 2024-08-03 MED ORDER — ACETAMINOPHEN 325 MG PO TABS: 650.0000 mg | ORAL_TABLET | ORAL | Status: DC | PRN

## 2024-08-03 MED ORDER — HEPARIN (PORCINE) IN NACL 1000-0.9 UT/500ML-% IV SOLN
INTRAVENOUS | Status: DC | PRN
  Administered 2024-08-03 (×2): 500 mL

## 2024-08-03 MED ORDER — FENTANYL CITRATE (PF) 100 MCG/2ML IJ SOLN
INTRAMUSCULAR | Status: DC | PRN
  Administered 2024-08-03: 25 ug via INTRAVENOUS

## 2024-08-03 MED ORDER — HYDRALAZINE HCL 20 MG/ML IJ SOLN: 10.0000 mg | INTRAMUSCULAR | Status: AC | PRN

## 2024-08-03 MED ORDER — LABETALOL HCL 5 MG/ML IV SOLN: 10.0000 mg | INTRAVENOUS | Status: DC | PRN

## 2024-08-03 MED ORDER — MIDAZOLAM HCL (PF) 2 MG/2ML IJ SOLN
INTRAMUSCULAR | Status: DC | PRN
  Administered 2024-08-03: 1 mg via INTRAVENOUS

## 2024-08-03 MED ORDER — IOHEXOL 350 MG/ML SOLN
INTRAVENOUS | Status: DC | PRN
  Administered 2024-08-03: 150 mL

## 2024-08-03 MED ORDER — FUROSEMIDE 10 MG/ML IJ SOLN
40.0000 mg | Freq: Two times a day (BID) | INTRAMUSCULAR | Status: DC
  Administered 2024-08-03 – 2024-08-04 (×2): 40 mg via INTRAVENOUS
  Filled 2024-08-03 (×2): qty 4

## 2024-08-03 MED ORDER — SODIUM CHLORIDE 0.9% FLUSH: 10.0000 mL | INTRAVENOUS | Status: DC | PRN

## 2024-08-03 MED ORDER — LIDOCAINE HCL (PF) 1 % IJ SOLN
INTRAMUSCULAR | Status: AC
  Filled 2024-08-03: qty 30

## 2024-08-03 MED ORDER — LIDOCAINE HCL (PF) 1 % IJ SOLN
INTRAMUSCULAR | Status: DC | PRN
  Administered 2024-08-03: 5 mL via INTRADERMAL

## 2024-08-03 MED ORDER — HEPARIN SODIUM (PORCINE) 1000 UNIT/ML IJ SOLN
INTRAMUSCULAR | Status: AC
  Filled 2024-08-03: qty 10

## 2024-08-03 MED ORDER — FREE WATER
250.0000 mL | Freq: Once | Status: AC
  Administered 2024-08-03: 250 mL via ORAL

## 2024-08-03 MED ORDER — FREE WATER: 500.0000 mL | Freq: Once | Status: DC

## 2024-08-03 MED ORDER — SODIUM CHLORIDE 0.9% FLUSH
3.0000 mL | Freq: Two times a day (BID) | INTRAVENOUS | Status: DC
  Administered 2024-08-03 – 2024-08-04 (×2): 3 mL via INTRAVENOUS

## 2024-08-03 MED ORDER — CHLORHEXIDINE GLUCONATE CLOTH 2 % EX PADS
6.0000 | MEDICATED_PAD | Freq: Every day | CUTANEOUS | Status: DC
  Administered 2024-08-03 – 2024-08-04 (×2): 6 via TOPICAL

## 2024-08-03 MED ORDER — ASPIRIN 81 MG PO CHEW
81.0000 mg | CHEWABLE_TABLET | ORAL | Status: AC
  Administered 2024-08-03: 81 mg via ORAL
  Filled 2024-08-03: qty 1

## 2024-08-03 MED ORDER — SACUBITRIL-VALSARTAN 24-26 MG PO TABS
1.0000 | ORAL_TABLET | Freq: Two times a day (BID) | ORAL | Status: DC
  Administered 2024-08-03: 1 via ORAL
  Filled 2024-08-03: qty 1

## 2024-08-03 MED ORDER — VERAPAMIL HCL 2.5 MG/ML IV SOLN
INTRAVENOUS | Status: AC
  Filled 2024-08-03: qty 2

## 2024-08-03 MED ORDER — MIDAZOLAM HCL 2 MG/2ML IJ SOLN
INTRAMUSCULAR | Status: AC
  Filled 2024-08-03: qty 2

## 2024-08-03 MED ORDER — FENTANYL CITRATE (PF) 100 MCG/2ML IJ SOLN
INTRAMUSCULAR | Status: AC
  Filled 2024-08-03: qty 2

## 2024-08-03 MED ORDER — HEPARIN SODIUM (PORCINE) 1000 UNIT/ML IJ SOLN
INTRAMUSCULAR | Status: DC | PRN
  Administered 2024-08-03: 6000 [IU] via INTRAVENOUS
  Administered 2024-08-03: 4000 [IU] via INTRAVENOUS

## 2024-08-03 MED ORDER — SODIUM CHLORIDE 0.9 % IV SOLN
INTRAVENOUS | Status: DC | PRN
  Administered 2024-08-03: 10 mL/h via INTRAVENOUS

## 2024-08-03 MED ORDER — SODIUM CHLORIDE 0.9% FLUSH: 3.0000 mL | INTRAVENOUS | Status: DC | PRN

## 2024-08-03 NOTE — Progress Notes (Addendum)
"  °  Progress Note  Patient Name: Joshua Nelson Date of Encounter: 08/03/2024 [facility] Cardiologist: None   Interval Summary    No complaints this morning, breathing improved.   Vital Signs Vitals:   08/02/24 2310 08/03/24 0352 08/03/24 0729 08/03/24 0820  BP: (!) 146/80 (!) 158/77 (!) 146/80   Pulse: 80 79 76 85  Resp: 19 19 12  (!) 21  Temp: 98.4 F (36.9 C) 98.1 F (36.7 C) 97.9 F (36.6 C)   TempSrc: Oral Oral Oral   SpO2: 100% 98% 95% 93%  Weight:      Height:        Intake/Output Summary (Last 24 hours) at 08/03/2024 0908 Last data filed at 08/03/2024 0730 Gross per 24 hour  Intake 1070.18 ml  Output 3800 ml  Net -2729.82 ml      07/31/2024   11:00 PM 07/28/2024    9:45 AM 07/16/2024    3:05 PM  Last 3 Weights  Weight (lbs) 173 lb 15.1 oz 173 lb 14.4 oz 175 lb  Weight (kg) 78.9 kg 78.881 kg 79.379 kg      Telemetry/ECG   Sinus Rhythm - Personally Reviewed  Physical Exam  GEN: No acute distress.   Neck: No JVD Cardiac: RRR, no murmurs, rubs, or gallops.  Respiratory: Clear to auscultation bilaterally. GI: Soft, nontender, non-distended  MS: No edema  Assessment & Plan   73 y.o. 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   NSTEMI CAD prior PCI -- presented with chest pain and shortness of breath for the past couple of days prior to admission. Initial symptoms started on Tuesday last week.  -- hsTn [id number], EKG showed slight ST depression in lateral leads -- planned for [facility] today  -- remains on IV heparin , ASA, Brilinta  (started yesterday), statin  Acute HFrEF -- suspect ischemic in nature -- Echo 12/20 with LVEF of 30-35% with hypokinesis in the inferior, basal, apical lateral and basal inferoseptal walls, g2DD, normal RV, severely elevated PASP, severe LA dilation, moderate MR, mild  LFLG aortic stenosis  -- GDMT: continue farxiga , spiro 25mg  daily, will plan to add Entresto  post cath   HTN -- stable -- as above, continue spiro 25mg  daily, and plan for Entresto  post cath   HLD -- on crestor  40mg  daily, Zetia  10mg  daily -- check lipids   Stage IV rectal cancer s/ rectal radiation -- following with oncology outpatient, Dr. Ezzard  -- Active chemotx (FOLFOX/Avastin )  For questions or updates, please contact [facility] Please consult [email or web address] for contact info under     Signed, Manuelita Rummer, NP   Agree with note by Manuelita Rummer NP-C  Patient admitted with non-STEMI.  Currently pain-free on IV heparin .  Known CAD status post intervention in 2011 and 2012.  Other issues as outlined with reduced LV function.  BNP elevated at 5700 although lungs are clear and no evidence of overt heart failure.  EF reduced at 35%.  Patient loaded with DAPT including Brilinta .  Probably not at CABG candidate given metastatic rectal cancer on chemo.  Plan left and right heart cath today.  Dorn DOROTHA Lesches, M.D., FACP, [facility], LYNITA [facility] [address] [place], [place]  [address]  [phone] 08/03/2024 10:47 AM   "

## 2024-08-03 NOTE — Plan of Care (Signed)
   Problem: Clinical Measurements: Goal: Will remain free from infection Outcome: Progressing Goal: Diagnostic test results will improve Outcome: Progressing Goal: Respiratory complications will improve Outcome: Progressing   Problem: Activity: Goal: Risk for activity intolerance will decrease Outcome: Progressing   Problem: Nutrition: Goal: Adequate nutrition will be maintained Outcome: Progressing   Problem: Coping: Goal: Level of anxiety will decrease Outcome: Progressing

## 2024-08-03 NOTE — Progress Notes (Signed)
 Turned off 2 L [place] this morning with sats >99% to see if able to lay flat for cath. Haven't spoken to oncologist but Mr. Grieser would like to proceed. 93% O2 off O2.   Donnice DELENA Primus, MD [facility]  Cardiac Electrophysiology

## 2024-08-03 NOTE — H&P (View-Only) (Signed)
"  °  Progress Note  Patient Name: Joshua Nelson Date of Encounter: 08/03/2024 [facility] Cardiologist: None   Interval Summary    No complaints this morning, breathing improved.   Vital Signs Vitals:   08/02/24 2310 08/03/24 0352 08/03/24 0729 08/03/24 0820  BP: (!) 146/80 (!) 158/77 (!) 146/80   Pulse: 80 79 76 85  Resp: 19 19 12  (!) 21  Temp: 98.4 F (36.9 C) 98.1 F (36.7 C) 97.9 F (36.6 C)   TempSrc: Oral Oral Oral   SpO2: 100% 98% 95% 93%  Weight:      Height:        Intake/Output Summary (Last 24 hours) at 08/03/2024 0908 Last data filed at 08/03/2024 0730 Gross per 24 hour  Intake 1070.18 ml  Output 3800 ml  Net -2729.82 ml      07/31/2024   11:00 PM 07/28/2024    9:45 AM 07/16/2024    3:05 PM  Last 3 Weights  Weight (lbs) 173 lb 15.1 oz 173 lb 14.4 oz 175 lb  Weight (kg) 78.9 kg 78.881 kg 79.379 kg      Telemetry/ECG   Sinus Rhythm - Personally Reviewed  Physical Exam  GEN: No acute distress.   Neck: No JVD Cardiac: RRR, no murmurs, rubs, or gallops.  Respiratory: Clear to auscultation bilaterally. GI: Soft, nontender, non-distended  MS: No edema  Assessment & Plan   73 y.o. 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   NSTEMI CAD prior PCI -- presented with chest pain and shortness of breath for the past couple of days prior to admission. Initial symptoms started on Tuesday last week.  -- hsTn 728>>762, EKG showed slight ST depression in lateral leads -- planned for [facility] today  -- remains on IV heparin , ASA, Brilinta  (started yesterday), statin  Acute HFrEF -- suspect ischemic in nature -- Echo 12/20 with LVEF of 30-35% with hypokinesis in the inferior, basal, apical lateral and basal inferoseptal walls, g2DD, normal RV, severely elevated PASP, severe LA dilation, moderate MR, mild  LFLG aortic stenosis  -- GDMT: continue farxiga , spiro 25mg  daily, will plan to add Entresto  post cath   HTN -- stable -- as above, continue spiro 25mg  daily, and plan for Entresto  post cath   HLD -- on crestor  40mg  daily, Zetia  10mg  daily -- check lipids   Stage IV rectal cancer s/ rectal radiation -- following with oncology outpatient, Dr. Ezzard  -- Active chemotx (FOLFOX/Avastin )  For questions or updates, please contact [facility] Please consult [email or web address] for contact info under     Signed, Manuelita Rummer, NP   Agree with note by Manuelita Rummer NP-C  Patient admitted with non-STEMI.  Currently pain-free on IV heparin .  Known CAD status post intervention in 2011 and 2012.  Other issues as outlined with reduced LV function.  BNP elevated at 5700 although lungs are clear and no evidence of overt heart failure.  EF reduced at 35%.  Patient loaded with DAPT including Brilinta .  Probably not at CABG candidate given metastatic rectal cancer on chemo.  Plan left and right heart cath today.  Dorn DOROTHA Lesches, M.D., FACP, [facility], LYNITA [facility] [address] [place], [place]  [address]  [phone] 08/03/2024 10:47 AM   "

## 2024-08-03 NOTE — Progress Notes (Signed)
 PHARMACY - ANTICOAGULATION CONSULT NOTE  Pharmacy Consult for Heparin  Indication: chest pain/ACS  Allergies[1]  Patient Measurements: Height: 5' 9 (175.3 cm) Weight: 78.9 kg (173 lb 15.1 oz) IBW/kg (Calculated) : 70.7 HEPARIN  DW (KG): 78.9  Vital Signs: Temp: 97.9 F (36.6 C) (12/22 0729) Temp Source: Oral (12/22 0729) BP: 146/80 (12/22 0729) Pulse Rate: 76 (12/22 0729)  Labs: Recent Labs    07/31/24 2213 08/01/24 0407 08/01/24 0500 08/01/24 2313 08/02/24 0435 08/02/24 0749 08/02/24 1923 08/03/24 0500  HGB 9.0* 9.2*  --  9.2*  --   --   --  9.1*  HCT 29.3* 29.1*  --  28.6*  --   --   --  28.9*  PLT 101* 100*  --  101*  --   --   --  120*  HEPARINUNFRC 0.29*  --    < > 0.40   < > >1.10* 0.29* 0.90*  CREATININE 0.79  --   --  0.60*  --   --   --   --    < > = values in this interval not displayed.    Estimated Creatinine Clearance: 84.7 mL/min (A) (by C-G formula based on SCr of 0.6 mg/dL (L)).  Assessment: 73 yo male transferred from [facility] for evaluation of chest pain. Patient was started on a heparin  drip on 12/17 at 1600 units/hr. Most recent CBC: Hgb 9.7, Plts 97 (on chemo). Per RN, no signs of bleeding reported.  Heparin  level elevated after rate adjustment, will reduce rate and F/U [facility] plans post/cath. CBC ok.   Goal of Therapy:  Heparin  level 0.3-0.7 units/ml Monitor platelets by anticoagulation protocol: Yes   Plan:  -Reduce heparin  to 1500 units/h -F/U [facility] plans post/cath  Ozell Jamaica, PharmD, BCPS, [facility] Clinical Pharmacist [phone] Please check AMION for all [facility] Pharm[facility] numbers 08/03/2024         [1] No Known Allergies

## 2024-08-03 NOTE — Interval H&P Note (Signed)
 History and Physical Interval Note:  08/03/2024 11:49 AM  Joshua Nelson  has presented today for surgery, with the diagnosis of NSTEMI.  The various methods of treatment have been discussed with the patient and family. After consideration of risks, benefits and other options for treatment, the patient has consented to  Procedures: RIGHT/LEFT HEART CATH AND CORONARY ANGIOGRAPHY (N/A) as a surgical intervention.  The patient's history has been reviewed, patient examined, no change in status, stable for surgery.  I have reviewed the patient's chart and labs.  Questions were answered to the patient's satisfaction.    Cath Lab Visit (complete for each Cath Lab visit)  Clinical Evaluation Leading to the Procedure:   ACS: Yes.    Non-ACS:    Anginal Classification: CCS III  Anti-ischemic medical therapy: Minimal Therapy (1 class of medications)  Non-Invasive Test Results: No non-invasive testing performed  Prior CABG: No previous CABG        Lonni Cash

## 2024-08-03 NOTE — Progress Notes (Signed)
 Patient not returning to [facility], CCMD called to notify. Assignment ended at this time.

## 2024-08-03 NOTE — Plan of Care (Signed)

## 2024-08-03 NOTE — Progress Notes (Signed)
 Transported off unit to cath lab at this time.  CCMD called to notify of transfer to cath lab and cardiac monitor placed on standby.  Heparin  infusion stopped at this time.  Family at bedside updated. Transport method: bed Escorted by: Cath Lab transporter

## 2024-08-03 NOTE — Telephone Encounter (Signed)
 Patient Product/process Development Scientist completed.    The patient is insured through [employer]/ [employer]. Patient has Medicare and is not eligible for a copay card, but may be able to apply for patient assistance or Medicare RX Payment Plan (Patient Must reach out to their plan, if eligible for payment plan), if available.    Ran test claim for Brilinta  90 mg and the current 30 day co-pay is $25.00.  Ran test claim for sacubitril -valsartan  24-26 mg and the current 30 day co-pay is $0.00.   This test claim was processed through [facility]- copay amounts may vary at other pharmacies due to pharmacy/plan contracts, or as the patient moves through the different stages of their insurance plan.     Reyes Sharps, CPHT Pharmacy Technician Patient Advocate Specialist Lead [facility] Patient Advocate Team Direct Number: [phone]  Fax: [phone]

## 2024-08-03 NOTE — Progress Notes (Signed)
 Pharmacy Quality Measure Review  This patient is appearing on a report for being at risk of failing the adherence measure for cholesterol (statin) medications this calendar year.   Medication: Rosuvastatin  40 mg Last fill date: 07/21/24 for 90 day supply  Insurance report was not up to date. No action needed at this time.   Jenkins Graces, PharmD PGY1 Pharmacy Resident

## 2024-08-03 NOTE — Progress Notes (Signed)
 Back  Joshua Nelson  FMW:[id number] DOB: 05-01-1951 DOA: 07/31/2024 PCP: Sherre Clapper, MD    Brief Narrative:  73 year old with a history of CAD status post PCI, HTN, HLD, DM2 with peripheral neuropathy, OSA, BPH, chronic anemia and thrombocytopenia, and stage IV rectal cancer with liver mets on palliative chemotherapy who presented to [facility] 12/17 with approximately 1 week of intermittent left-sided chest pressure associated with exertional dyspnea.  EKG at presentation at [facility] noted sinus tachycardia with nonspecific ST and T wave changes in the lateral leads.  CTa chest was negative for aortic dissection aneurysm or PE.  Cardiology evaluated the patient at [facility] and felt his presentation was consistent with NSTEMI.  His case was discussed with the on-call cardiologist at [facility] and the decision was made to transfer him to [facility].  Goals of Care:   Code Status: Full Code   DVT prophylaxis: IV heparin    Interim Hx: No acute events recorded overnight.  Afebrile.  No new complaints today. Anxious to get his cath done.   Assessment & Plan:  NSTEMI. -History of CAD status post multiple stents Care per Cardiology - [facility] 12/22  Community-acquired pneumonia CT chest obtained at [facility] reportedly suggested patchy ground glass densities in both lungs suggestive of pulmonary edema versus a pneumonia - patient reportedly did have a fever on presentation to [place] - RVP negative -procalcitonin unimpressive 12/20 - clinically no persisting symptoms to suggest active pulmonary infection - the patient has completed 5 days of antibiotic tx - no indication of ongoing infection at present   Acute combined systolic and diastolic CHF EF 59-54% via TTE at [facility] - TTE at [facility] 08/01/24 noted EF 30-35% with grade 2 DD with both global and regional WMA - likely ischemic in nature - no gross overload on exam presently - net negative approximately 4.4L for  this admission thus far  [employer]   07/31/24 2300  Weight: 78.9 kg     Acute hypoxic respiratory failure Likely primarily related to pulmonary edema - O2 has improved nicely with low level O2 support - wean to RA as able   Stage IV rectal cancer with liver mets Currently on palliative chemotherapy with FOLFOX/Avastin  - I feel that coronary revascularization is worth pursuing for the improved quality of life it could provide, as the patient has been quite symptomatic - he is certainly not end-of-life in regard to his cancer presently   Chronic anemia and thrombocytopenia Due to above - counts stable at present  HTN Follow w/o change for today - continue to diurese   HLD Continue ezetimibe  and rosuvastatin   DM2 with peripheral neuropathy CBG well controlled at present - does not appear to be on meds at home - A1c 5.3   Family Communication: Spoke with wife and sister at bedside Disposition: [facility] 12/22 -anticipate discharge home tomorrow   Objective: Blood pressure (!) 146/80, pulse 76, temperature 97.9 F (36.6 C), temperature source Oral, resp. rate 12, height 5' 9 (1.753 m), weight 78.9 kg, SpO2 95%.  Intake/Output Summary (Last 24 hours) at 08/03/2024 0757 Last data filed at 08/03/2024 0730 Gross per 24 hour  Intake 1430.18 ml  Output 3800 ml  Net -2369.82 ml   Filed Weights   07/31/24 2300  Weight: 78.9 kg    Examination: General: No acute respiratory distress Lungs: Clear to auscultation bilaterally without wheezes or crackles Cardiovascular: Regular rate and rhythm without murmur  Abdomen: NT/ND, soft, bs+  Extremities: No significant edema  bilateral lower extremities  CBC: Recent Labs  Lab 07/28/24 0909 07/31/24 2213 08/01/24 0407 08/01/24 2313 08/03/24 0500  WBC 4.0   < > 3.1* 3.5* 3.2*  NEUTROABS 2.7  --   --   --   --   HGB 9.3*   < > 9.2* 9.2* 9.1*  HCT 29.9*   < > 29.1* 28.6* 28.9*  MCV 97.7   < > 99.3 97.3 98.3  PLT 89*   < > 100*  101* 120*   < > = values in this interval not displayed.   Basic Metabolic Panel: Recent Labs  Lab 07/28/24 0909 07/31/24 2213 08/01/24 2313  NA 137 137 138  K 4.0 4.8 4.4  CL 106 109 109  CO2 23 22 21*  GLUCOSE 164* 105* 96  BUN 17 17 15   CREATININE 0.88 0.79 0.60*  CALCIUM  8.9 8.6* 8.4*  MG  --   --  2.0   GFR: Estimated Creatinine Clearance: 84.7 mL/min (A) (by C-G formula based on SCr of 0.6 mg/dL (L)).   Scheduled Meds:  aspirin  EC  81 mg Oral Daily   Chlorhexidine  Gluconate Cloth  6 each Topical Daily   dapagliflozin  propanediol  10 mg Oral Daily   ezetimibe   10 mg Oral Daily   gabapentin   300 mg Oral TID   metoprolol  succinate  50 mg Oral q morning   rosuvastatin   40 mg Oral QHS   sodium chloride  flush  10-40 mL Intracatheter Q12H   spironolactone   25 mg Oral Daily   ticagrelor   90 mg Oral BID   Continuous Infusions:  heparin  1,700 Units/hr (08/03/24 0730)     LOS: 3 days   Reyes IVAR Moores, MD [facility] Office  [phone] Pager - Text Page per Tracey  If 7PM-7AM, please contact night-coverage per Amion 08/03/2024, 7:57 AM

## 2024-08-03 NOTE — Progress Notes (Signed)
 Heart Failure Navigator Progress Note  Assessed for Heart & Vascular TOC clinic readiness.  Patient does not meet criteria due to metastatic cancer. No HF TOC. .   Navigator will sign off at this time.   Stephane Haddock, BSN, Scientist, Clinical (histocompatibility And Immunogenetics) Only

## 2024-08-04 ENCOUNTER — Other Ambulatory Visit (HOSPITAL_COMMUNITY): Payer: Self-pay

## 2024-08-04 DIAGNOSIS — I214 Non-ST elevation (NSTEMI) myocardial infarction: Secondary | ICD-10-CM | POA: Diagnosis not present

## 2024-08-04 LAB — CBC
HCT: 30.2 % — ABNORMAL LOW (ref 39.0–52.0)
Hemoglobin: 9.8 g/dL — ABNORMAL LOW (ref 13.0–17.0)
MCH: 31.2 pg (ref 26.0–34.0)
MCHC: 32.5 g/dL (ref 30.0–36.0)
MCV: 96.2 fL (ref 80.0–100.0)
Platelets: 117 K/uL — ABNORMAL LOW (ref 150–400)
RBC: 3.14 MIL/uL — ABNORMAL LOW (ref 4.22–5.81)
RDW: 17 % — ABNORMAL HIGH (ref 11.5–15.5)
WBC: 3.5 K/uL — ABNORMAL LOW (ref 4.0–10.5)
nRBC: 0 % (ref 0.0–0.2)

## 2024-08-04 LAB — BASIC METABOLIC PANEL WITH GFR
Anion gap: 10 (ref 5–15)
BUN: 10 mg/dL (ref 8–23)
CO2: 25 mmol/L (ref 22–32)
Calcium: 8.9 mg/dL (ref 8.9–10.3)
Chloride: 102 mmol/L (ref 98–111)
Creatinine, Ser: 0.66 mg/dL (ref 0.61–1.24)
GFR, Estimated: >60 mL/min
Glucose, Bld: 84 mg/dL (ref 70–99)
Potassium: 4 mmol/L (ref 3.5–5.1)
Sodium: 137 mmol/L (ref 135–145)

## 2024-08-04 LAB — GLUCOSE, CAPILLARY: Glucose-Capillary: 91 mg/dL (ref 70–99)

## 2024-08-04 MED ORDER — FUROSEMIDE 20 MG PO TABS: 20.0000 mg | ORAL_TABLET | Freq: Every day | ORAL | Status: DC

## 2024-08-04 MED ORDER — SACUBITRIL-VALSARTAN 49-51 MG PO TABS
1.0000 | ORAL_TABLET | Freq: Two times a day (BID) | ORAL | Status: DC
  Administered 2024-08-04: 1 via ORAL
  Filled 2024-08-04: qty 1

## 2024-08-04 MED ORDER — FUROSEMIDE 20 MG PO TABS
20.0000 mg | ORAL_TABLET | Freq: Every day | ORAL | 0 refills | Status: DC
  Filled 2024-08-04: qty 90, 90d supply, fill #0

## 2024-08-04 MED ORDER — SPIRONOLACTONE 25 MG PO TABS
25.0000 mg | ORAL_TABLET | Freq: Every day | ORAL | 0 refills | Status: DC
  Filled 2024-08-04: qty 90, 90d supply, fill #0

## 2024-08-04 MED ORDER — TICAGRELOR 90 MG PO TABS
90.0000 mg | ORAL_TABLET | Freq: Two times a day (BID) | ORAL | 0 refills | Status: DC
  Filled 2024-08-04: qty 180, 90d supply, fill #0

## 2024-08-04 MED ORDER — HEPARIN SOD (PORK) LOCK FLUSH 100 UNIT/ML IV SOLN
500.0000 [IU] | INTRAVENOUS | Status: AC | PRN
  Administered 2024-08-04: 500 [IU]

## 2024-08-04 MED ORDER — METOPROLOL SUCCINATE ER 50 MG PO TB24
50.0000 mg | ORAL_TABLET | Freq: Every morning | ORAL | 0 refills | Status: DC
  Filled 2024-08-04: qty 90, 90d supply, fill #0

## 2024-08-04 MED ORDER — SACUBITRIL-VALSARTAN 49-51 MG PO TABS
1.0000 | ORAL_TABLET | Freq: Two times a day (BID) | ORAL | 0 refills | Status: DC
  Filled 2024-08-04: qty 180, 90d supply, fill #0

## 2024-08-04 MED FILL — Ticagrelor Tab 90 MG: ORAL | Qty: 2 | Status: AC

## 2024-08-04 NOTE — Care Management Important Message (Signed)
 Important Message  Patient Details  Name: EDER MACEK MRN: [id number] Date of Birth: 22-Jul-1951   Important Message Given:  Yes - Medicare IM     Vonzell Arrie Sharps 08/04/2024, 12:26 PM

## 2024-08-04 NOTE — Progress Notes (Signed)
 CARDIAC REHAB PHASE I   Post NSTEMI/stent education including restrictions, risk factors, exercise guidelines, antiplatelet therapy importance, MI booklet, NTG use, heart healthy diet, and CRP2 reviewed. All questions and concerns addressed. Will refer to [facility] for CRP2.   [id number] Isaiah JAYSON Liverpool, RN BSN 08/04/2024 11:24 AM

## 2024-08-04 NOTE — Discharge Summary (Signed)
 "  DISCHARGE SUMMARY  Joshua Nelson  MR#: [id number]  DOB:1951-06-17  Date of Admission: 07/31/2024 Date of Discharge: 08/04/2024  Attending Physician:Kye Hedden ONEIDA Moores, MD  Patient's PCP:Cox, Abigail, MD  Disposition: D/C home   Follow-up Appts:  Follow-up Information     Cox, Kirsten, MD Follow up in 1 week(s).   Specialty: Family Medicine Contact information: [address] [place] [address] [phone]                 Discharge Diagnoses: NSTEMI - History of CAD status post multiple stents Acute combined systolic and diastolic CHF Community-acquired pneumonia - resolved  Acute hypoxic respiratory failure Stage IV rectal cancer with liver mets Chronic anemia and thrombocytopenia HTN HLD DM2 with peripheral neuropathy  Initial presentation: 73 year old with a history of CAD status post PCI, HTN, HLD, DM2 with peripheral neuropathy, OSA, BPH, chronic anemia and thrombocytopenia, and stage IV rectal cancer with liver mets on palliative chemotherapy who presented to [facility] 12/17 with approximately 1 week of intermittent left-sided chest pressure associated with exertional dyspnea. EKG at presentation at [facility] noted sinus tachycardia with nonspecific ST and T wave changes in the lateral leads. CTa chest was negative for aortic dissection aneurysm or PE. Cardiology evaluated the patient at [facility] and felt his presentation was consistent with NSTEMI. His case was discussed with the on-call cardiologist at [facility] and the decision was made to transfer him to [facility].   Hospital Course:  NSTEMI - History of CAD status post multiple stents Care per Cardiology - [facility] 12/22 with successful PCI/DES x 1 to mid RCA as well as PCI/DES x 1 to posterior lateral artery with recommendations for DAPT with aspirin /Brilinta  for at least 1 year - continue Crestor  and Zetia  - no chest pain at time of d/c    Acute combined systolic and  diastolic CHF EF 40-45% via TTE at [facility] - TTE at [facility] 08/01/24 noted EF 30-35% with grade 2 DD with both global and regional WMA - likely ischemic in nature - no gross overload on exam but LVEDP 27 at time of cath 12/22 - net negative approximately 9L for this admission - GDMT as per Cardiology w/ med regimen listed below    Community-acquired pneumonia - resolved  CT chest obtained at [facility] reportedly suggested patchy ground glass densities in both lungs suggestive of pulmonary edema versus a pneumonia - patient reportedly did have a fever on presentation to [place] - RVP negative -procalcitonin unimpressive 12/20 - clinically no persisting symptoms to suggest active pulmonary infection - the patient has completed 5 days of antibiotic tx - no indication of ongoing infection at present    Acute hypoxic respiratory failure Likely primarily related to pulmonary edema - O2 has improved nicely with diuresis -has been successfully weaned to room air prior to d/c    Stage IV rectal cancer with liver mets Currently on palliative chemotherapy with FOLFOX/Avastin    Chronic anemia and thrombocytopenia Due to above - counts stable at present   HTN Continue to follow with ongoing diuresis   HLD Continue ezetimibe  and rosuvastatin    DM2 with peripheral neuropathy CBG well controlled at present - does not appear to be on meds at home - A1c 5.3  Allergies as of 08/04/2024   No Known Allergies      Medication List     STOP taking these medications    lisinopril  10 MG tablet Commonly known as: ZESTRIL   TAKE these medications    aspirin  EC 81 MG tablet Take 81 mg by mouth daily. Swallow whole.   dapagliflozin  propanediol 10 MG Tabs tablet Commonly known as: Farxiga  Take 1 tablet (10 mg total) by mouth daily.   ezetimibe  10 MG tablet Commonly known as: ZETIA  TAKE ONE (1) TABLET ONCE DAILY   FreeStyle Libre 3 Plus Sensor Misc Change sensor every 15  days.   FreeStyle Libre Reader [facility] reader that works with freestyle libre 3+   furosemide  20 MG tablet Commonly known as: LASIX  Take 1 tablet (20 mg total) by mouth daily. Start taking on: August 05, 2024   gabapentin  300 MG capsule Commonly known as: NEURONTIN  Take 1 capsule (300 mg total) by mouth 3 (three) times daily.   Gvoke HypoPen  2-Pack 0.5 MG/0.1ML Soaj Generic drug: Glucagon  Inject 0.5 mg into the skin daily as needed.   meclizine  25 MG tablet Commonly known as: ANTIVERT  Take 1 tablet (25 mg total) by mouth 3 (three) times daily as needed for dizziness.   metoprolol  succinate 50 MG 24 hr tablet Commonly known as: TOPROL -XL Take 1 tablet (50 mg total) by mouth every morning. Take with or immediately following a meal. Start taking on: August 05, 2024 What changed:  medication strength how much to take additional instructions   OLANZapine  5 MG tablet Commonly known as: ZYPREXA  One tab at bedtime prn nauese What changed:  how much to take how to take this when to take this reasons to take this additional instructions   ondansetron  8 MG tablet Commonly known as: ZOFRAN  Take 1 tablet (8 mg total) by mouth every 8 (eight) hours as needed for nausea or vomiting.   prochlorperazine  10 MG tablet Commonly known as: COMPAZINE  Take 1 tablet (10 mg total) by mouth every 6 (six) hours as needed for nausea or vomiting.   rosuvastatin  40 MG tablet Commonly known as: CRESTOR  Take 1 tablet (40 mg total) by mouth at bedtime.   sacubitril -valsartan  49-51 MG Commonly known as: ENTRESTO  Take 1 tablet by mouth 2 (two) times daily.   spironolactone  25 MG tablet Commonly known as: ALDACTONE  Take 1 tablet (25 mg total) by mouth daily.   ticagrelor  90 MG Tabs tablet Commonly known as: BRILINTA  Take 1 tablet (90 mg total) by mouth 2 (two) times daily.        Day of Discharge BP (!) 140/80 (BP Location: Left Arm)   Pulse 71   Temp 97.8 F (36.6  C) (Oral)   Resp 20   Ht 5' 9 (1.753 m)   Wt 78.9 kg   SpO2 96%   BMI 25.69 kg/m   Physical Exam: General: No acute respiratory distress Lungs: Clear to auscultation bilaterally without wheezes or crackles Cardiovascular: Regular rate and rhythm without murmur gallop or rub normal S1 and S2 Abdomen: Nontender, nondistended, soft, bowel sounds positive, no rebound, no ascites, no appreciable mass Extremities: No significant cyanosis, clubbing, or edema bilateral lower extremities  Basic Metabolic Panel: Recent Labs  Lab 07/31/24 2213 08/01/24 2313 08/03/24 1211 08/03/24 1218 08/04/24 0502  NA 137 138 142 138  140 137  K 4.8 4.4 4.0 4.0  4.2 4.0  CL 109 109  --   --  102  CO2 22 21*  --   --  25  GLUCOSE 105* 96  --   --  84  BUN 17 15  --   --  10  CREATININE 0.79 0.60*  --   --  0.66  CALCIUM  8.6* 8.4*  --   --  8.9  MG  --  2.0  --   --   --     CBC: Recent Labs  Lab 07/31/24 2213 08/01/24 0407 08/01/24 2313 08/03/24 0500 08/03/24 1211 08/03/24 1218 08/04/24 0502  WBC 3.3* 3.1* 3.5* 3.2*  --   --  3.5*  HGB 9.0* 9.2* 9.2* 9.1* 8.5* 8.8*  8.8* 9.8*  HCT 29.3* 29.1* 28.6* 28.9* 25.0* 26.0*  26.0* 30.2*  MCV 100.0 99.3 97.3 98.3  --   --  96.2  PLT 101* 100* 101* 120*  --   --  117*    Time spent in discharge (includes decision making & examination of pt): 35 minutes  08/04/2024, 2:33 PM   Joshua IVAR Moores, MD [facility] Office  [phone]      "

## 2024-08-04 NOTE — TOC Initial Note (Signed)
 Transition of Care [facility]) - Initial/Assessment Note    Patient Details  Name: Joshua Nelson MRN: [id number] Date of Birth: 1951-01-14  Transition of Care [facility]) CM/SW Contact:    Sudie Erminio Deems, RN Phone Number: 08/04/2024, 10:55 AM  Clinical Narrative: Patient presented for chest pain. PTA patient was independent from home with spouse. Patient has PCP and has no issues with transportation.  Patient is not active with any home health services. No home health needs identified at this time. Spouse will provide transportation home once stable.                  Expected Discharge Plan: Home/Self Care Barriers to Discharge: Continued Medical Work up   Patient Goals and CMS Choice Patient states their goals for this hospitalization and ongoing recovery are:: plan to return home  Expected Discharge Plan and Services In-house Referral: NA Discharge Planning Services: CM Consult Post Acute Care Choice: NA Living arrangements for the past 2 months: Single Family Home                   DME Agency: NA   Prior Living Arrangements/Services Living arrangements for the past 2 months: Single Family Home Lives with:: Spouse Patient language and need for interpreter reviewed:: Yes Do you feel safe going back to the place where you live?: Yes      Need for Family Participation in Patient Care: No (Comment) Care giver support system in place?: No (comment)   Criminal Activity/Legal Involvement Pertinent to Current Situation/Hospitalization: No - Comment as needed  Activities of Daily Living   ADL Screening (condition at time of admission) Independently performs ADLs?: Yes (appropriate for developmental age) Is the patient deaf or have difficulty hearing?: No Does the patient have difficulty seeing, even when wearing glasses/contacts?: No Does the patient have difficulty concentrating, remembering, or making decisions?: No  Permission Sought/Granted Permission sought to share  information with : Family Supports, Case Manager   Emotional Assessment Appearance:: Appears stated age Attitude/Demeanor/Rapport: Engaged Affect (typically observed): Appropriate Orientation: : Oriented to Self, Oriented to Place, Oriented to Situation, Oriented to  Time Alcohol / Substance Use: Not Applicable Psych Involvement: No (comment)  Admission diagnosis:  NSTEMI (non-ST elevated myocardial infarction) [facility]) [I21.4] Patient Active Problem List   Diagnosis Date Noted   NSTEMI (non-ST elevated myocardial infarction) (HCC) 07/31/2024   CAP (community acquired pneumonia) 07/31/2024   Acute CHF (HCC) 07/31/2024   Acute hypoxemic respiratory failure (HCC) 07/31/2024   Elevated lipase 07/31/2024   Arthritis    Diabetes mellitus without complication (HCC)    Hypertension    Insomnia    Myocardial infarct (HCC)    PONV (postoperative nausea and vomiting)    Vertigo 01/19/2024   Encounter for central line care 01/02/2024   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) 10/30/2023   Rectal bleeding 09/21/2023   BMI 27.0-27.9,adult 06/09/2023   Encounter for immunization 06/09/2023   Urge incontinence 07/12/2022   Prostate cancer (HCC) 08/08/2021   BPH with obstruction/lower urinary tract symptoms 08/01/2021   Mixed hyperlipidemia 11/30/2019   Hypertensive heart disease without heart failure 11/30/2019   Chronic left shoulder pain 11/30/2019   Diabetic polyneuropathy associated with type 2 diabetes mellitus (HCC) 11/30/2019   Obstructive sleep apnea 04/03/2018   CAD (coronary artery disease) 03/28/2018   PCP:  Sherre Clapper, MD Pharmacy:   [facility] - [place], [place] - [address] [place] [address] Phone: [phone] Fax: [phone]  [facility] (  [facility]) - [place], [place] - [address] [place] [address] Phone: [phone] Fax: [phone]  Social Drivers of Health (SDOH) Social  History: SDOH Screenings   Food Insecurity: No Food Insecurity (08/01/2024)  Housing: Low Risk (08/01/2024)  Transportation Needs: No Transportation Needs (08/01/2024)  Utilities: Not At Risk (08/01/2024)  Alcohol Screen: Low Risk (04/30/2024)  Depression (PHQ2-9): Low Risk (06/30/2024)  Financial Resource Strain: Low Risk (04/30/2024)  Physical Activity: Inactive (04/30/2024)  Social Connections: Socially Integrated (08/01/2024)  Stress: No Stress Concern Present (04/30/2024)  Tobacco Use: Medium Risk (08/01/2024)  Health Literacy: Adequate Health Literacy (04/30/2024)   SDOH Interventions:     Readmission Risk Interventions     No data to display

## 2024-08-04 NOTE — Progress Notes (Addendum)
 "  Progress Note  Patient Name: Joshua Nelson Date of Encounter: 08/04/2024 [facility] Cardiologist: None   Interval Summary    Feels well this morning. Breathing much improved.   Vital Signs Vitals:   08/03/24 1951 08/04/24 0028 08/04/24 0429 08/04/24 0809  BP: (!) 144/71 (!) 158/75 (!) 150/82 (!) 143/79  Pulse: 79 86 80 85  Resp: 20 18 18 18   Temp: 98 F (36.7 C) 98.4 F (36.9 C) 98.1 F (36.7 C) 97.8 F (36.6 C)  TempSrc: Oral Oral Oral Oral  SpO2: 96% 95% 96% 96%  Weight:      Height:        Intake/Output Summary (Last 24 hours) at 08/04/2024 0929 Last data filed at 08/04/2024 0504 Gross per 24 hour  Intake 312.36 ml  Output 4850 ml  Net -4537.64 ml      07/31/2024   11:00 PM 07/28/2024    9:45 AM 07/16/2024    3:05 PM  Last 3 Weights  Weight (lbs) 173 lb 15.1 oz 173 lb 14.4 oz 175 lb  Weight (kg) 78.9 kg 78.881 kg 79.379 kg      Telemetry/ECG   Sinus Rhythm, 2 brief episodes of SVT - Personally Reviewed  Physical Exam  GEN: No acute distress.   Neck: No JVD Cardiac: RRR, no murmurs, rubs, or gallops.  Respiratory: Clear to auscultation bilaterally. GI: Soft, nontender, non-distended  MS: No edema VAS: right radial cath site stable  Assessment & Plan   73 y.o. 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    NSTEMI CAD prior PCI -- presented with chest pain and shortness of breath for the past couple of days prior to admission. Initial symptoms started on Tuesday last week.  -- hsTn [id number], EKG showed slight ST depression in lateral leads -- Cardiac catheterization 12/22 with successful PCI/DES x 1 to mid RCA as well as PCI/DES x 1 to posterior lateral artery with recommendations for DAPT with aspirin /Brilinta  for at least 1 year.  Also noted to have elevated filling pressures with LVEDP of  27. CI 2.66/CO 5.18 -- Continue aspirin , Brilinta , Crestor  40 mg daily, Zetia  10 mg daily -- CR to see, needs to ambulate   Acute HFrEF ICM -- Echo 12/20 with LVEF of 30-35% with hypokinesis in the inferior, basal, apical lateral and basal inferoseptal walls, g2DD, normal RV, severely elevated PASP, severe LA dilation, moderate MR, mild LFLG aortic stenosis  -- RHC: RA 8, PAPi 4.8, CO 5.18/CI 2.6, LVEDP 27 -- on IV lasix  40mg  BID, UOP 4.8L overnight, net - 8.9 -- GDMT: continue Toprol  50mg  daily, farxiga , spiro 25mg  daily, increase Entresto  49/51mg  BID. Lasix  20mg  daily PRN at discharge    HTN -- elevated -- as above, continue spiro 25mg  daily, increase Entresto  49/51 and continue Toprol  50  HLD -- on crestor  40mg  daily, Zetia  10mg  daily -- check lipids    Stage IV rectal cancer s/ rectal radiation -- following with oncology outpatient, Dr. Ezzard  -- active chemotx (FOLFOX/Avastin )  Will arrange outpatient follow up appt  For questions or updates, please contact [facility] Please consult [email or web address] for contact info under   Signed, Manuelita Rummer, NP   Agree with note by Manuelita Rummer NP-C  Patient admitted with non-STEMI.  Troponins increased to 700.  He does have a history of ischemic  cardiomyopathy now with an EF of 30%.  He underwent PCI and drug-eluting stenting by Dr. Verlin yesterday of a mid RCA and distal RCA.  There were no other culprits.  Did have otherwise moderate but nonobstructive disease.  Other problems as outlined on medication for GDMT as well as hyperlipidemia.  On DAPT.  Okay for discharge.  Will arrange outpatient follow-up.  Dorn DOROTHA Lesches, M.D., FACP, [facility], LYNITA [facility] [address] [place], [place]  [address]  [phone] 08/04/2024 12:08 PM  "

## 2024-08-04 NOTE — Progress Notes (Signed)
 CARDIAC REHAB PHASE I   PRE:  Rate/Rhythm: Sinus 72  BP:    Supine: 156/69     SaO2: 97% RA  MODE:  Ambulation: 450 ft   POST:  Rate/Rhythem: 71  BP:   Sitting: 159/77     SaO2: 99% RA 1320-1406 Patient ambulated in the hallway using rollator. Tolerated well. Patient assisted to recliner with call bell within reach.   Hadassah Elpidio Quan RN

## 2024-08-04 NOTE — Progress Notes (Signed)
 Reviewed AVS, patient expressed understanding of medications, MD follow up reviewed.   Patient states all belongings brought to the hospital at time of admission are accounted for and packed to take home.  Picked up medications from [facility] pharmacy. Nursing Staff contacted to transport patient to entrance A, where family member was waiting in vehicle to transport home.

## 2024-08-10 ENCOUNTER — Other Ambulatory Visit: Payer: Self-pay

## 2024-08-10 ENCOUNTER — Inpatient Hospital Stay

## 2024-08-10 ENCOUNTER — Inpatient Hospital Stay: Admitting: Hematology and Oncology

## 2024-08-10 ENCOUNTER — Telehealth (HOSPITAL_COMMUNITY): Payer: Self-pay

## 2024-08-10 VITALS — BP 135/75 | HR 75 | Temp 97.5°F | Resp 18 | Ht 69.0 in | Wt 166.1 lb

## 2024-08-10 DIAGNOSIS — Z5111 Encounter for antineoplastic chemotherapy: Secondary | ICD-10-CM | POA: Diagnosis present

## 2024-08-10 DIAGNOSIS — C20 Malignant neoplasm of rectum: Secondary | ICD-10-CM

## 2024-08-10 DIAGNOSIS — C787 Secondary malignant neoplasm of liver and intrahepatic bile duct: Secondary | ICD-10-CM | POA: Diagnosis not present

## 2024-08-10 DIAGNOSIS — Z79899 Other long term (current) drug therapy: Secondary | ICD-10-CM | POA: Diagnosis not present

## 2024-08-10 DIAGNOSIS — D6481 Anemia due to antineoplastic chemotherapy: Secondary | ICD-10-CM | POA: Diagnosis not present

## 2024-08-10 DIAGNOSIS — G629 Polyneuropathy, unspecified: Secondary | ICD-10-CM | POA: Diagnosis not present

## 2024-08-10 LAB — CBC WITH DIFFERENTIAL (CANCER CENTER ONLY)
Abs Immature Granulocytes: 0.03 K/uL (ref 0.00–0.07)
Basophils Absolute: 0 K/uL (ref 0.0–0.1)
Basophils Relative: 1 %
Eosinophils Absolute: 0.2 K/uL (ref 0.0–0.5)
Eosinophils Relative: 4 %
HCT: 31.8 % — ABNORMAL LOW (ref 39.0–52.0)
Hemoglobin: 10 g/dL — ABNORMAL LOW (ref 13.0–17.0)
Immature Granulocytes: 1 %
Lymphocytes Relative: 17 %
Lymphs Abs: 0.8 K/uL (ref 0.7–4.0)
MCH: 30.5 pg (ref 26.0–34.0)
MCHC: 31.4 g/dL (ref 30.0–36.0)
MCV: 97 fL (ref 80.0–100.0)
Monocytes Absolute: 0.6 K/uL (ref 0.1–1.0)
Monocytes Relative: 13 %
Neutro Abs: 2.9 K/uL (ref 1.7–7.7)
Neutrophils Relative %: 64 %
Platelet Count: 114 K/uL — ABNORMAL LOW (ref 150–400)
RBC: 3.28 MIL/uL — ABNORMAL LOW (ref 4.22–5.81)
RDW: 17.2 % — ABNORMAL HIGH (ref 11.5–15.5)
WBC Count: 4.5 K/uL (ref 4.0–10.5)
nRBC: 0 % (ref 0.0–0.2)

## 2024-08-10 LAB — CMP (CANCER CENTER ONLY)
ALT: 13 U/L (ref 0–44)
AST: 34 U/L (ref 15–41)
Albumin: 3.7 g/dL (ref 3.5–5.0)
Alkaline Phosphatase: 54 U/L (ref 38–126)
Anion gap: 7 (ref 5–15)
BUN: 14 mg/dL (ref 8–23)
CO2: 25 mmol/L (ref 22–32)
Calcium: 9.2 mg/dL (ref 8.9–10.3)
Chloride: 105 mmol/L (ref 98–111)
Creatinine: 0.89 mg/dL (ref 0.61–1.24)
GFR, Estimated: >60 mL/min
Glucose, Bld: 148 mg/dL — ABNORMAL HIGH (ref 70–99)
Potassium: 4.4 mmol/L (ref 3.5–5.1)
Sodium: 137 mmol/L (ref 135–145)
Total Bilirubin: 0.6 mg/dL (ref 0.0–1.2)
Total Protein: 7.5 g/dL (ref 6.5–8.1)

## 2024-08-10 LAB — TOTAL PROTEIN, URINE DIPSTICK: Protein, ur: 100 mg/dL — AB

## 2024-08-10 MED ORDER — SODIUM CHLORIDE 0.9 % IV SOLN
1920.0000 mg/m2 | INTRAVENOUS | Status: DC
  Administered 2024-08-10: 3900 mg via INTRAVENOUS
  Filled 2024-08-10: qty 78

## 2024-08-10 MED ORDER — DIPHENHYDRAMINE HCL 50 MG/ML IJ SOLN
25.0000 mg | Freq: Once | INTRAMUSCULAR | Status: AC
  Administered 2024-08-10: 25 mg via INTRAVENOUS
  Filled 2024-08-10: qty 1

## 2024-08-10 MED ORDER — FLUOROURACIL CHEMO INJECTION 2.5 GM/50ML
320.0000 mg/m2 | Freq: Once | INTRAVENOUS | Status: AC
  Administered 2024-08-10: 650 mg via INTRAVENOUS
  Filled 2024-08-10: qty 13

## 2024-08-10 MED ORDER — LEUCOVORIN CALCIUM INJECTION 350 MG
320.0000 mg/m2 | Freq: Once | INTRAVENOUS | Status: AC
  Administered 2024-08-10: 652 mg via INTRAVENOUS
  Filled 2024-08-10: qty 32.6

## 2024-08-10 MED ORDER — FAMOTIDINE IN NACL 20-0.9 MG/50ML-% IV SOLN
20.0000 mg | Freq: Once | INTRAVENOUS | Status: AC
  Administered 2024-08-10: 20 mg via INTRAVENOUS
  Filled 2024-08-10: qty 50

## 2024-08-10 MED ORDER — PALONOSETRON HCL INJECTION 0.25 MG/5ML
0.2500 mg | Freq: Once | INTRAVENOUS | Status: AC
  Administered 2024-08-10: 0.25 mg via INTRAVENOUS
  Filled 2024-08-10: qty 5

## 2024-08-10 MED ORDER — SODIUM CHLORIDE 0.9 % IV SOLN: INTRAVENOUS | Status: DC

## 2024-08-10 MED ORDER — SODIUM CHLORIDE 0.9 % IV SOLN
5.0000 mg/kg | Freq: Once | INTRAVENOUS | Status: AC
  Administered 2024-08-10: 400 mg via INTRAVENOUS
  Filled 2024-08-10: qty 16

## 2024-08-10 MED ORDER — DEXAMETHASONE SOD PHOSPHATE PF 10 MG/ML IJ SOLN
10.0000 mg | Freq: Once | INTRAMUSCULAR | Status: AC
  Administered 2024-08-10: 10 mg via INTRAVENOUS

## 2024-08-10 MED ORDER — DEXTROSE 5 % IV SOLN: INTRAVENOUS | Status: DC

## 2024-08-10 MED ORDER — SODIUM CHLORIDE 0.9 % IV SOLN
150.0000 mg | Freq: Once | INTRAVENOUS | Status: AC
  Administered 2024-08-10: 150 mg via INTRAVENOUS
  Filled 2024-08-10: qty 150

## 2024-08-10 MED ORDER — OXALIPLATIN CHEMO INJECTION 100 MG/20ML
35.0000 mg/m2 | Freq: Once | INTRAVENOUS | Status: AC
  Administered 2024-08-10: 70 mg via INTRAVENOUS
  Filled 2024-08-10: qty 14

## 2024-08-10 NOTE — Patient Instructions (Signed)
 Fluorouracil  Injection What is this medication? FLUOROURACIL  (flure oh YOOR a sil) treats some types of cancer. It works by slowing down the growth of cancer cells. This medicine may be used for other purposes; ask your health care provider or pharmacist if you have questions. COMMON BRAND NAME(S): Adrucil  What should I tell my care team before I take this medication? They need to know if you have any of these conditions: Blood disorders Dihydropyrimidine dehydrogenase (DPD) deficiency Infection, such as chickenpox, cold sores, herpes Kidney disease Liver disease Poor nutrition Recent or ongoing radiation therapy An unusual or allergic reaction to fluorouracil , other medications, foods, dyes, or preservatives If you or your partner are pregnant or trying to get pregnant Breast-feeding How should I use this medication? This medication is injected into a vein. It is administered by your care team in a hospital or clinic setting. Talk to your care team about the use of this medication in children. Special care may be needed. Overdosage: If you think you have taken too much of this medicine contact a poison control center or emergency room at once. NOTE: This medicine is only for you. Do not share this medicine with others. What if I miss a dose? Keep appointments for follow-up doses. It is important not to miss your dose. Call your care team if you are unable to keep an appointment. What may interact with this medication? Do not take this medication with any of the following: Live virus vaccines This medication may also interact with the following: Medications that treat or prevent blood clots, such as warfarin, enoxaparin , dalteparin This list may not describe all possible interactions. Give your health care provider a list of all the medicines, herbs, non-prescription drugs, or dietary supplements you use. Also tell them if you smoke, drink alcohol, or use illegal drugs. Some items may  interact with your medicine. What should I watch for while using this medication? Your condition will be monitored carefully while you are receiving this medication. This medication may make you feel generally unwell. This is not uncommon as chemotherapy can affect healthy cells as well as cancer cells. Report any side effects. Continue your course of treatment even though you feel ill unless your care team tells you to stop. In some cases, you may be given additional medications to help with side effects. Follow all directions for their use. This medication may increase your risk of getting an infection. Call your care team for advice if you get a fever, chills, sore throat, or other symptoms of a cold or flu. Do not treat yourself. Try to avoid being around people who are sick. This medication may increase your risk to bruise or bleed. Call your care team if you notice any unusual bleeding. Be careful brushing or flossing your teeth or using a toothpick because you may get an infection or bleed more easily. If you have any dental work done, tell your dentist you are receiving this medication. Avoid taking medications that contain aspirin , acetaminophen , ibuprofen , naproxen, or ketoprofen unless instructed by your care team. These medications may hide a fever. Do not treat diarrhea with over the counter products. Contact your care team if you have diarrhea that lasts more than 2 days or if it is severe and watery. This medication can make you more sensitive to the sun. Keep out of the sun. If you cannot avoid being in the sun, wear protective clothing and sunscreen. Do not use sun lamps, tanning beds, or tanning booths. Talk to  your care team if you or your partner wish to become pregnant or think you might be pregnant. This medication can cause serious birth defects if taken during pregnancy and for 3 months after the last dose. A reliable form of contraception is recommended while taking this  medication and for 3 months after the last dose. Talk to your care team about effective forms of contraception. Do not father a child while taking this medication and for 3 months after the last dose. Use a condom while having sex during this time period. Do not breastfeed while taking this medication. This medication may cause infertility. Talk to your care team if you are concerned about your fertility. What side effects may I notice from receiving this medication? Side effects that you should report to your care team as soon as possible: Allergic reactions--skin rash, itching, hives, swelling of the face, lips, tongue, or throat Heart attack--pain or tightness in the chest, shoulders, arms, or jaw, nausea, shortness of breath, cold or clammy skin, feeling faint or lightheaded Heart failure--shortness of breath, swelling of the ankles, feet, or hands, sudden weight gain, unusual weakness or fatigue Heart rhythm changes--fast or irregular heartbeat, dizziness, feeling faint or lightheaded, chest pain, trouble breathing High ammonia level--unusual weakness or fatigue, confusion, loss of appetite, nausea, vomiting, seizures Infection--fever, chills, cough, sore throat, wounds that don't heal, pain or trouble when passing urine, general feeling of discomfort or being unwell Low red blood cell level--unusual weakness or fatigue, dizziness, headache, trouble breathing Pain, tingling, or numbness in the hands or feet, muscle weakness, change in vision, confusion or trouble speaking, loss of balance or coordination, trouble walking, seizures Redness, swelling, and blistering of the skin over hands and feet Severe or prolonged diarrhea Unusual bruising or bleeding Side effects that usually do not require medical attention (report to your care team if they continue or are bothersome): Dry skin Headache Increased tears Nausea Pain, redness, or swelling with sores inside the mouth or throat Sensitivity  to light Vomiting This list may not describe all possible side effects. Call your doctor for medical advice about side effects. You may report side effects to FDA at 1-800-FDA-1088. Where should I keep my medication? This medication is given in a hospital or clinic. It will not be stored at home. NOTE: This sheet is a summary. It may not cover all possible information. If you have questions about this medicine, talk to your doctor, pharmacist, or health care provider.  2024 Elsevier/Gold Standard (2021-12-05 00:00:00)Bevacizumab  Injection What is this medication? BEVACIZUMAB  (be va SIZ yoo mab) treats some types of cancer. It works by blocking a protein that causes cancer cells to grow and multiply. This helps to slow or stop the spread of cancer cells. It is a monoclonal antibody. This medicine may be used for other purposes; ask your health care provider or pharmacist if you have questions. COMMON BRAND NAME(S): Alymsys , Avastin , MVASI , Vegzalma, Zirabev  What should I tell my care team before I take this medication? They need to know if you have any of these conditions: Blood clots Coughing up blood Having or recent surgery Heart failure High blood pressure History of a connection between 2 or more body parts that do not usually connect (fistula) History of a tear in your stomach or intestines Protein in your urine An unusual or allergic reaction to bevacizumab , other medications, foods, dyes, or preservatives Pregnant or trying to get pregnant Breast-feeding How should I use this medication? This medication is injected into a  vein. It is given by your care team in a hospital or clinic setting. Talk to your care team the use of this medication in children. Special care may be needed. Overdosage: If you think you have taken too much of this medicine contact a poison control center or emergency room at once. NOTE: This medicine is only for you. Do not share this medicine with  others. What if I miss a dose? Keep appointments for follow-up doses. It is important not to miss your dose. Call your care team if you are unable to keep an appointment. What may interact with this medication? Interactions are not expected. This list may not describe all possible interactions. Give your health care provider a list of all the medicines, herbs, non-prescription drugs, or dietary supplements you use. Also tell them if you smoke, drink alcohol, or use illegal drugs. Some items may interact with your medicine. What should I watch for while using this medication? Your condition will be monitored carefully while you are receiving this medication. You may need blood work while taking this medication. This medication may make you feel generally unwell. This is not uncommon as chemotherapy can affect healthy cells as well as cancer cells. Report any side effects. Continue your course of treatment even though you feel ill unless your care team tells you to stop. This medication may increase your risk to bruise or bleed. Call your care team if you notice any unusual bleeding. Before having surgery, talk to your care team to make sure it is ok. This medication can increase the risk of poor healing of your surgical site or wound. You will need to stop this medication for 28 days before surgery. After surgery, wait at least 28 days before restarting this medication. Make sure the surgical site or wound is healed enough before restarting this medication. Talk to your care team if questions. Talk to your care team if you may be pregnant. Serious birth defects can occur if you take this medication during pregnancy and for 6 months after the last dose. Contraception is recommended while taking this medication and for 6 months after the last dose. Your care team can help you find the option that works for you. Do not breastfeed while taking this medication and for 6 months after the last dose. This  medication can cause infertility. Talk to your care team if you are concerned about your fertility. What side effects may I notice from receiving this medication? Side effects that you should report to your care team as soon as possible: Allergic reactions--skin rash, itching, hives, swelling of the face, lips, tongue, or throat Bleeding--bloody or black, tar-like stools, vomiting blood or brown material that looks like coffee grounds, red or dark brown urine, small red or purple spots on skin, unusual bruising or bleeding Blood clot--pain, swelling, or warmth in the leg, shortness of breath, chest pain Heart attack--pain or tightness in the chest, shoulders, arms, or jaw, nausea, shortness of breath, cold or clammy skin, feeling faint or lightheaded Heart failure--shortness of breath, swelling of the ankles, feet, or hands, sudden weight gain, unusual weakness or fatigue Increase in blood pressure Infection--fever, chills, cough, sore throat, wounds that don't heal, pain or trouble when passing urine, general feeling of discomfort or being unwell Infusion reactions--chest pain, shortness of breath or trouble breathing, feeling faint or lightheaded Kidney injury--decrease in the amount of urine, swelling of the ankles, hands, or feet Stomach pain that is severe, does not go away, or gets  worse Stroke--sudden numbness or weakness of the face, arm, or leg, trouble speaking, confusion, trouble walking, loss of balance or coordination, dizziness, severe headache, change in vision Sudden and severe headache, confusion, change in vision, seizures, which may be signs of posterior reversible encephalopathy syndrome (PRES) Side effects that usually do not require medical attention (report to your care team if they continue or are bothersome): Back pain Change in taste Diarrhea Dry skin Increased tears Nosebleed This list may not describe all possible side effects. Call your doctor for medical advice  about side effects. You may report side effects to FDA at 1-800-FDA-1088. Where should I keep my medication? This medication is given in a hospital or clinic. It will not be stored at home. NOTE: This sheet is a summary. It may not cover all possible information. If you have questions about this medicine, talk to your doctor, pharmacist, or health care provider.  2024 Elsevier/Gold Standard (2021-12-15 00:00:00)

## 2024-08-10 NOTE — Progress Notes (Signed)
 " [facility]  [address] [place],  [place]  [address] [phone]  Clinic Day:  08/10/2024  Referring physician: Sherre Clapper, MD   HISTORY OF PRESENT ILLNESS:  The patient is a 73 y.o. male with  metastatic rectal cancer, including spread of disease to his liver.  He comes in today to be evaluated before heading into his 17th cycle of FOLFOX/Avastin .  He states he tolerated his 16th cycle well.  His peripheral neuropathy has not progressed over time.  His oxaliplatin  dose has already been decreased by 50+%.  He continues to deny having any GI issues which concern him for overt signs of disease progression.  VITALS:  There were no vitals taken for this visit. Wt Readings from Last 3 Encounters:  07/31/24 173 lb 15.1 oz (78.9 kg)  07/28/24 173 lb 14.4 oz (78.9 kg)  07/16/24 175 lb (79.4 kg)   There is no height or weight on file to calculate BMI.  Performance status (ECOG): 1 - Symptomatic but completely ambulatory  PHYSICAL EXAM:   Physical Exam Vitals and nursing note reviewed.  Constitutional:      General: He is not in acute distress.    Appearance: Normal appearance. He is normal weight.  HENT:     Head: Normocephalic and atraumatic.     Mouth/Throat:     Mouth: Mucous membranes are moist.     Pharynx: Oropharynx is clear. No oropharyngeal exudate or posterior oropharyngeal erythema.  Eyes:     General: No scleral icterus.    Extraocular Movements: Extraocular movements intact.     Conjunctiva/sclera: Conjunctivae normal.     Pupils: Pupils are equal, round, and reactive to light.  Cardiovascular:     Rate and Rhythm: Normal rate and regular rhythm.     Heart sounds: Normal heart sounds. No murmur heard.    No friction rub. No gallop.  Pulmonary:     Effort: Pulmonary effort is normal.     Breath sounds: Normal breath sounds. No wheezing, rhonchi or rales.  Abdominal:     General: Bowel sounds are normal. There is no distension.      Palpations: Abdomen is soft. There is no hepatomegaly, splenomegaly or mass.     Tenderness: There is no abdominal tenderness.  Musculoskeletal:        General: Normal range of motion.     Cervical back: Normal range of motion and neck supple. No tenderness.     Right lower leg: No edema.     Left lower leg: No edema.  Lymphadenopathy:     Cervical: No cervical adenopathy.     Upper Body:     Right upper body: No supraclavicular or axillary adenopathy.     Left upper body: No supraclavicular or axillary adenopathy.     Lower Body: No right inguinal adenopathy. No left inguinal adenopathy.  Skin:    General: Skin is warm and dry.     Coloration: Skin is not jaundiced.     Findings: No rash.  Neurological:     Mental Status: He is alert and oriented to person, place, and time.     Cranial Nerves: No cranial nerve deficit.  Psychiatric:        Mood and Affect: Mood normal.        Behavior: Behavior normal.        Thought Content: Thought content normal.    LABS:      Latest Ref Rng & Units 08/04/2024  5:02 AM 08/03/2024   12:18 PM 08/03/2024   12:11 PM  CBC  WBC 4.0 - 10.5 K/uL 3.5     Hemoglobin 13.0 - 17.0 g/dL 9.8  8.8    8.8  8.5   Hematocrit 39.0 - 52.0 % 30.2  26.0    26.0  25.0   Platelets 150 - 400 K/uL 117         Latest Ref Rng & Units 08/04/2024    5:02 AM 08/03/2024   12:18 PM 08/03/2024   12:11 PM  CMP  Glucose 70 - 99 mg/dL 84     BUN 8 - 23 mg/dL 10     Creatinine 9.38 - 1.24 mg/dL 9.33     Sodium 864 - 854 mmol/L 137  140    138  142   Potassium 3.5 - 5.1 mmol/L 4.0  4.2    4.0  4.0   Chloride 98 - 111 mmol/L 102     CO2 22 - 32 mmol/L 25     Calcium  8.9 - 10.3 mg/dL 8.9      Lab Results  Component Value Date   CEA 5.27 (H) 07/14/2024   CEA 7.04 (H) 02/06/2024   ASSESSMENT & PLAN:  Assessment/Plan:  A 73 y.o. male with metastatic rectal cancer including spread of his disease to his liver. Before completing cycle 17, he was evaluated in  the ED for chest pain/ dyspnea. His 5-FU was stopped and he was evaluated by [facility] where he underwent placement of 2 stents. He presents today and states he feels better than he has in a long time. After review with Dr. Ezzard and Dr. Edwyna, he will proceed with cycle 18 today. He will return to clinic in 2 weeks for repeat evaluation prior to cycle 19. The patient understands all the plans discussed today and is in agreement with them.  Eleanor DELENA Bach, NP       "

## 2024-08-10 NOTE — Telephone Encounter (Signed)
 Faxed outside referral for Phase II Cardiac Rehab to [facility].

## 2024-08-11 ENCOUNTER — Inpatient Hospital Stay

## 2024-08-11 ENCOUNTER — Inpatient Hospital Stay: Admitting: Oncology

## 2024-08-11 ENCOUNTER — Ambulatory Visit: Admitting: Cardiology

## 2024-08-12 ENCOUNTER — Inpatient Hospital Stay

## 2024-08-12 NOTE — Patient Instructions (Signed)
 [facility] - A DEPT OF [facility]  Discharge Instructions: Thank you for choosing [facility] to provide your oncology and hematology care.  If you have a lab appointment with the Cancer Center, please go directly to the Cancer Center and check in at the registration area.   Wear comfortable clothing and clothing appropriate for easy access to any Portacath or PICC line.   We strive to give you quality time with your provider. You may need to reschedule your appointment if you arrive late (15 or more minutes).  Arriving late affects you and other patients whose appointments are after yours.  Also, if you miss three or more appointments without notifying the office, you may be dismissed from the clinic at the provider's discretion.      For prescription refill requests, have your pharmacy contact our office and allow 72 hours for refills to be completed.    Today you received the following chemotherapy and/or immunotherapy agents    To help prevent nausea and vomiting after your treatment, we encourage you to take your nausea medication as directed.  BELOW ARE SYMPTOMS THAT SHOULD BE REPORTED IMMEDIATELY: *FEVER GREATER THAN 100.4 F (38 C) OR HIGHER *CHILLS OR SWEATING *NAUSEA AND VOMITING THAT IS NOT CONTROLLED WITH YOUR NAUSEA MEDICATION *UNUSUAL SHORTNESS OF BREATH *UNUSUAL BRUISING OR BLEEDING *URINARY PROBLEMS (pain or burning when urinating, or frequent urination) *BOWEL PROBLEMS (unusual diarrhea, constipation, pain near the anus) TENDERNESS IN MOUTH AND THROAT WITH OR WITHOUT PRESENCE OF ULCERS (sore throat, sores in mouth, or a toothache) UNUSUAL RASH, SWELLING OR PAIN  UNUSUAL VAGINAL DISCHARGE OR ITCHING   Items with * indicate a potential emergency and should be followed up as soon as possible or go to the Emergency Department if any problems should occur.  Please show the CHEMOTHERAPY ALERT CARD or IMMUNOTHERAPY ALERT CARD at  check-in to the Emergency Department and triage nurse.  Should you have questions after your visit or need to cancel or reschedule your appointment, please contact [facility]  - A DEPT OF [facility]  Dept: [phone]  and follow the prompts.  Office hours are 8:00 a.m. to 4:30 p.m. Monday - Friday. Please note that voicemails left after 4:00 p.m. may not be returned until the following business day.  We are closed weekends and major holidays. You have access to a nurse at all times for urgent questions. Please call the main number to the clinic Dept: [phone] and follow the prompts.  For any non-urgent questions, you may also contact your provider using MyChart. We now offer e-Visits for anyone 55 and older to request care online for non-urgent symptoms. For details visit [email or web address].   Also download the MyChart app! Go to the app store, search MyChart, open the app, select [facility], and log in with your MyChart username and password.

## 2024-08-14 ENCOUNTER — Other Ambulatory Visit: Payer: Self-pay | Admitting: Family Medicine

## 2024-08-14 DIAGNOSIS — E1142 Type 2 diabetes mellitus with diabetic polyneuropathy: Secondary | ICD-10-CM

## 2024-08-20 ENCOUNTER — Other Ambulatory Visit: Payer: Self-pay | Admitting: Oncology

## 2024-08-20 ENCOUNTER — Ambulatory Visit: Attending: Cardiology | Admitting: Cardiology

## 2024-08-20 ENCOUNTER — Other Ambulatory Visit: Payer: Self-pay | Admitting: Family Medicine

## 2024-08-20 ENCOUNTER — Encounter: Payer: Self-pay | Admitting: Cardiology

## 2024-08-20 VITALS — BP 138/72 | HR 70 | Ht 69.0 in | Wt 168.6 lb

## 2024-08-20 DIAGNOSIS — I2510 Atherosclerotic heart disease of native coronary artery without angina pectoris: Secondary | ICD-10-CM | POA: Diagnosis not present

## 2024-08-20 DIAGNOSIS — C20 Malignant neoplasm of rectum: Secondary | ICD-10-CM

## 2024-08-20 DIAGNOSIS — I429 Cardiomyopathy, unspecified: Secondary | ICD-10-CM | POA: Insufficient documentation

## 2024-08-20 DIAGNOSIS — I119 Hypertensive heart disease without heart failure: Secondary | ICD-10-CM | POA: Diagnosis not present

## 2024-08-20 DIAGNOSIS — E782 Mixed hyperlipidemia: Secondary | ICD-10-CM | POA: Diagnosis not present

## 2024-08-20 DIAGNOSIS — C787 Secondary malignant neoplasm of liver and intrahepatic bile duct: Secondary | ICD-10-CM

## 2024-08-20 NOTE — Patient Instructions (Signed)
 Medication Instructions:  Your physician recommends that you continue on your current medications as directed. Please refer to the Current Medication list given to you today.  *If you need a refill on your cardiac medications before your next appointment, please call your pharmacy*   Lab Work: Your physician recommends that you have a CMP and CBC today in the office.  If you have labs (blood work) drawn today and your tests are completely normal, you will receive your results only by: MyChart Message (if you have MyChart) OR A paper copy in the mail If you have any lab test that is abnormal or we need to change your treatment, we will call you to review the results.   Testing/Procedures: None ordered   Follow-Up: At [facility], you and your health needs are our priority.  As part of our continuing mission to provide you with exceptional heart care, we have created designated Provider Care Teams.  These Care Teams include your primary Cardiologist (physician) and Advanced Practice Providers (APPs -  Physician Assistants and Nurse Practitioners) who all work together to provide you with the care you need, when you need it.  We recommend signing up for the patient portal called MyChart.  Sign up information is provided on this After Visit Summary.  MyChart is used to connect with patients for Virtual Visits (Telemedicine).  Patients are able to view lab/test results, encounter notes, upcoming appointments, etc.  Non-urgent messages can be sent to your provider as well.   To learn more about what you can do with MyChart, go to [email or web address].    Your next appointment:   6 month(s)  The format for your next appointment:   In Person  Provider:   Jennifer Crape, MD    Other Instructions none  Important Information About Sugar

## 2024-08-20 NOTE — Progress Notes (Signed)
 " Cardiology Office Note:    Date:  08/20/2024   ID:  Joshua Nelson, DOB Jul 30, 1951, MRN [id number]  PCP:  Joshua Clapper, MD  Cardiologist:  Joshua JONELLE Crape, MD   Referring MD: Joshua Clapper, MD    ASSESSMENT:    1. Coronary artery disease involving native coronary artery of native heart without angina pectoris   2. 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 Hypertensive heart disease without heart failure   4. 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 Mixed hyperlipidemia    PLAN:    In order of problems listed above:  Coronary artery disease: Non-STEMI: Coronary stenting: Secondary prevention stressed with the patient.  Importance of compliance with diet and medication stressed any vocalized understanding.  He was advised to ambulate to the best of his ability. Patient has history of rectal cancer with metastasis.  I told him to be cautious with dual anticoagulation.  He has no history of rectal bleeding or dark stools.  I will do blood work including Chem-7 and CBC and this will be evaluated tomorrow when he sees his primary care also. Cardiomyopathy: On guideline directed medical therapy.  CHF education given his diet and salt intake issues discussed Essential hypertension: Blood pressure stable and diet was emphasized. Mixed dyslipidemia: On lipid-lowering medications followed by primary care. Patient will be seen in follow-up appointment in 6 months or earlier if the patient has any concerns.    Medication Adjustments/Labs and Tests Ordered: Current medicines are reviewed at length with the patient today.  Concerns regarding medicines are outlined above.  Orders Placed This Encounter  Procedures   CBC   Comprehensive metabolic panel with GFR   No orders of the defined types were placed in this encounter.    No chief complaint on file.    History of Present Illness:    Joshua Nelson is a 74 y.o. male.  Patient has past medical history of  coronary artery disease disease, non-STEMI and recently was admitted to the hospital with coronary stenting.  He has history of hypertension, dyslipidemia colon cancer, with metastasis.  He denies any problems at this time.  No chest pain orthopnea or PND.  His sister accompanies him for this visit.  At the time of my evaluation, the patient is alert awake oriented and in no distress.  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/01/2021   CAD (coronary artery disease) 03/28/2018   CAP (community acquired pneumonia) 07/31/2024   Chronic left shoulder pain 11/30/2019   Diabetes mellitus without complication (HCC)    Diabetic polyneuropathy associated with type 2 diabetes mellitus (HCC) 11/30/2019   Elevated lipase 07/31/2024   Encounter for central line care 01/02/2024   Encounter for immunization 06/09/2023   Hypertension    Hypertensive heart disease without heart failure 11/30/2019   Insomnia    Mixed hyperlipidemia 11/30/2019   Myocardial infarct [facility])    NSTEMI (non-ST elevated myocardial infarction) (HCC) 07/31/2024   Obstructive sleep apnea 04/03/2018   PONV (postoperative nausea and vomiting)    Prostate cancer (HCC) 07/2021   Rectal bleeding 09/21/2023   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) 10/30/2023   Urge incontinence 07/12/2022   Vertigo 01/19/2024    Past Surgical History:  Procedure Laterality Date   CARDIAC CATHETERIZATION  THREE CARDIAC STENTS PLACED   COLONOSCOPY W/ POLYPECTOMY     CORONARY STENT INTERVENTION N/A 08/03/2024   Procedure: CORONARY STENT INTERVENTION;  Surgeon: Verlin Lonni BIRCH, MD;  Location: [facility];  Service: Cardiovascular;  Laterality: N/A;   CYSTOSCOPY WITH URETHRAL DILATATION N/A 08/01/2021   Procedure: CYSTOSCOPY WITH BALLOON URETHRAL  DILATATION;  Surgeon: Elisabeth Valli BIRCH, MD;  Location: [facility];  Service: Urology;  Laterality: N/A;   left cataract  10/2019   NOSE SURGERY     RESULT OF DOG BITE DURING CHILDHOOD   PROSTATE BIOPSY     RIGHT/LEFT HEART CATH AND CORONARY ANGIOGRAPHY N/A 08/03/2024   Procedure: RIGHT/LEFT HEART CATH AND CORONARY ANGIOGRAPHY;  Surgeon: Verlin Lonni BIRCH, MD;  Location: [facility];  Service: Cardiovascular;  Laterality: N/A;   TRANSURETHRAL RESECTION OF BLADDER TUMOR N/A 08/01/2021   Procedure: TRANSURETHRAL RESECTION OF PROSTATE;  Surgeon: Elisabeth Valli BIRCH, MD;  Location: [facility];  Service: Urology;  Laterality: N/A;  90 MINS   URETHRAL STRICTURE DILATATION      Current Medications: Active Medications[1]   Allergies:   Patient has no known allergies.   Social History   Socioeconomic History   Marital status: Married    Spouse name: Nathanel   Number of children: 1   Years of education: 12   Highest education level: 12th grade  Occupational History   Occupation: RETIRED - TRUCK DRIVER  Tobacco Use   Smoking status: Former    Current packs/day: 0.00    Types: Cigarettes    Quit date: 2003    Years since quitting: 23.0   Smokeless tobacco: Never  Vaping Use   Vaping status: Never Used  Substance and Sexual Activity   Alcohol use: Not Currently    Comment: none since 2000   Drug use: Never   Sexual activity: Not Currently  Other Topics Concern   Not on file  Social History Narrative   Lives at home with his wife.   2 cups caffeine per day.   Right-handed.   Social Drivers of Health   Tobacco Use: Medium Risk (08/20/2024)   Patient History    Smoking Tobacco Use: Former    Smokeless Tobacco Use: Never    Passive Exposure: Not on file  Financial Resource Strain: Low Risk (04/30/2024)   Overall Financial Resource Strain (CARDIA)    Difficulty of Paying Living Expenses: Not hard at all  Food Insecurity: No Food Insecurity (08/01/2024)   Epic    Worried About  Programme Researcher, Broadcasting/film/video in the Last Year: Never true    Ran Out of Food in the Last Year: Never true  Transportation Needs: No Transportation Needs (08/01/2024)   Epic    Lack of Transportation (Medical): No    Lack of Transportation (Non-Medical): No  Physical Activity: Inactive (04/30/2024)   Exercise Vital Sign    Days of Exercise per Week: 0 days    Minutes of Exercise per Session: 0 min  Stress: No Stress Concern Present (04/30/2024)   [employer] of Occupational Health - Occupational Stress Questionnaire    Feeling of Stress: Only a little  Social Connections: Socially Integrated (08/01/2024)   Social Connection and Isolation Panel    Frequency of Communication with Friends and Family: More than three times a week    Frequency of Social Gatherings with Friends and Family: More than three times a week    Attends Religious Services: More than 4 times per year    Active  Member of Clubs or Organizations: Yes    Attends Banker Meetings: More than 4 times per year    Marital Status: Married  Depression (PHQ2-9): Low Risk (08/12/2024)   Depression (PHQ2-9)    PHQ-2 Score: 0  Alcohol Screen: Low Risk (04/30/2024)   Alcohol Screen    Last Alcohol Screening Score (AUDIT): 0  Housing: Low Risk (08/01/2024)   Epic    Unable to Pay for Housing in the Last Year: No    Number of Times Moved in the Last Year: 0    Homeless in the Last Year: No  Utilities: Not At Risk (08/01/2024)   Epic    Threatened with loss of utilities: No  Health Literacy: Adequate Health Literacy (04/30/2024)   B1300 Health Literacy    Frequency of need for help with medical instructions: Never     Family History: The patient's family history includes AAA (abdominal aortic aneurysm) in his mother; Anxiety disorder in his father; Barrett's esophagus in his sister; CAD in his mother; Deep vein thrombosis in his father; Dementia in his father; Depression in his father; Endometrial cancer in his mother;  Heart attack in his brother; Heart disease in his father; Hyperlipidemia in his sister; Hypertension in his father; Kidney Stones in his brother and sister. There is no history of Colon cancer, Esophageal cancer, Liver cancer, or Stomach cancer.  ROS:   Please see the history of present illness.    All other systems reviewed and are negative.  EKGs/Labs/Other Studies Reviewed:    The following studies were reviewed today: .SABRA   I discussed my findings with the patient at length   Recent Labs: 09/16/2023: TSH 1.180 07/31/2024: Pro Brain Natriuretic Peptide 5,785.0 08/01/2024: Magnesium 2.0 08/10/2024: ALT 13; BUN 14; Creatinine 0.89; Hemoglobin 10.0; Platelet Count 114; Potassium 4.4; Sodium 137  Recent Lipid Panel    Component Value Date/Time   CHOL 133 01/13/2024 0815   TRIG 87 01/13/2024 0815   HDL 42 01/13/2024 0815   CHOLHDL 3.2 01/13/2024 0815   LDLCALC 74 01/13/2024 0815    Physical Exam:    VS:  BP 138/72   Pulse 70   Ht 5' 9 (1.753 m)   Wt 168 lb 9.6 oz (76.5 kg)   SpO2 98%   BMI 24.90 kg/m     Wt Readings from Last 3 Encounters:  08/20/24 168 lb 9.6 oz (76.5 kg)  08/10/24 166 lb 1.6 oz (75.3 kg)  07/31/24 173 lb 15.1 oz (78.9 kg)     GEN: Patient is in no acute distress HEENT: Normal NECK: No JVD; No carotid bruits LYMPHATICS: No lymphadenopathy CARDIAC: Hear sounds regular, 2/6 systolic murmur at the apex. RESPIRATORY:  Clear to auscultation without rales, wheezing or rhonchi  ABDOMEN: Soft, non-tender, non-distended MUSCULOSKELETAL:  No edema; No deformity  SKIN: Warm and dry NEUROLOGIC:  Alert and oriented x 3 PSYCHIATRIC:  Normal affect   Signed, Joshua JONELLE Crape, MD  08/20/2024 8:27 AM    [facility]     [1]  Current Meds  Medication Sig   aspirin  EC 81 MG tablet Take 81 mg by mouth daily. Swallow whole.   Continuous Glucose Receiver (FREESTYLE LIBRE 3 READER) DEVI Use as directed   Continuous Glucose Sensor  (FREESTYLE LIBRE 3 PLUS SENSOR) MISC Change sensor every 15 days.   dapagliflozin  propanediol (FARXIGA ) 10 MG TABS tablet Take 1 tablet (10 mg total) by mouth daily.   ezetimibe  (ZETIA ) 10 MG tablet TAKE ONE (  1) TABLET ONCE DAILY   furosemide  (LASIX ) 20 MG tablet Take 1 tablet (20 mg total) by mouth daily.   gabapentin  (NEURONTIN ) 300 MG capsule Take 1 capsule (300 mg total) by mouth 3 (three) times daily.   Glucagon  (GVOKE HYPOPEN  2-PACK) 0.5 MG/0.1ML SOAJ Inject 0.5 mg into the skin daily as needed.   meclizine  (ANTIVERT ) 25 MG tablet Take 1 tablet (25 mg total) by mouth 3 (three) times daily as needed for dizziness.   metoprolol  succinate (TOPROL -XL) 50 MG 24 hr tablet Take 1 tablet (50 mg total) by mouth every morning. Take with or immediately following a meal.   OLANZapine  (ZYPREXA ) 5 MG tablet One tab at bedtime prn nauese   ondansetron  (ZOFRAN ) 8 MG tablet Take 1 tablet (8 mg total) by mouth every 8 (eight) hours as needed for nausea or vomiting.   prochlorperazine  (COMPAZINE ) 10 MG tablet Take 1 tablet (10 mg total) by mouth every 6 (six) hours as needed for nausea or vomiting.   rosuvastatin  (CRESTOR ) 40 MG tablet Take 1 tablet (40 mg total) by mouth at bedtime.   sacubitril -valsartan  (ENTRESTO ) 49-51 MG Take 1 tablet by mouth 2 (two) times daily.   spironolactone  (ALDACTONE ) 25 MG tablet Take 1 tablet (25 mg total) by mouth daily.   ticagrelor  (BRILINTA ) 90 MG TABS tablet Take 1 tablet (90 mg total) by mouth 2 (two) times daily.   "

## 2024-08-21 ENCOUNTER — Ambulatory Visit: Payer: Self-pay | Admitting: Cardiology

## 2024-08-21 ENCOUNTER — Encounter: Payer: Self-pay | Admitting: Oncology

## 2024-08-21 LAB — COMPREHENSIVE METABOLIC PANEL WITH GFR
ALT: 16 IU/L (ref 0–44)
AST: 24 IU/L (ref 0–40)
Albumin: 3.8 g/dL (ref 3.8–4.8)
Alkaline Phosphatase: 50 IU/L (ref 47–123)
BUN/Creatinine Ratio: 18 (ref 10–24)
BUN: 16 mg/dL (ref 8–27)
Bilirubin Total: 0.5 mg/dL (ref 0.0–1.2)
CO2: 23 mmol/L (ref 20–29)
Calcium: 9.1 mg/dL (ref 8.6–10.2)
Chloride: 106 mmol/L (ref 96–106)
Creatinine, Ser: 0.89 mg/dL (ref 0.76–1.27)
Globulin, Total: 3 g/dL (ref 1.5–4.5)
Glucose: 125 mg/dL — ABNORMAL HIGH (ref 70–99)
Potassium: 4.7 mmol/L (ref 3.5–5.2)
Sodium: 139 mmol/L (ref 134–144)
Total Protein: 6.8 g/dL (ref 6.0–8.5)
eGFR: 92 mL/min/1.73

## 2024-08-21 LAB — CBC
Hematocrit: 32.2 % — ABNORMAL LOW (ref 37.5–51.0)
Hemoglobin: 10 g/dL — ABNORMAL LOW (ref 13.0–17.7)
MCH: 30.6 pg (ref 26.6–33.0)
MCHC: 31.1 g/dL — ABNORMAL LOW (ref 31.5–35.7)
MCV: 99 fL — ABNORMAL HIGH (ref 79–97)
Platelets: 96 x10E3/uL — CL (ref 150–450)
RBC: 3.27 x10E6/uL — ABNORMAL LOW (ref 4.14–5.80)
RDW: 16.3 % — ABNORMAL HIGH (ref 11.6–15.4)
WBC: 3.1 x10E3/uL — ABNORMAL LOW (ref 3.4–10.8)

## 2024-08-24 ENCOUNTER — Ambulatory Visit: Admitting: Family Medicine

## 2024-08-24 VITALS — BP 134/70 | HR 67 | Temp 97.8°F | Ht 69.0 in | Wt 170.0 lb

## 2024-08-24 DIAGNOSIS — D61810 Antineoplastic chemotherapy induced pancytopenia: Secondary | ICD-10-CM | POA: Diagnosis not present

## 2024-08-24 DIAGNOSIS — E782 Mixed hyperlipidemia: Secondary | ICD-10-CM

## 2024-08-24 DIAGNOSIS — C787 Secondary malignant neoplasm of liver and intrahepatic bile duct: Secondary | ICD-10-CM | POA: Diagnosis not present

## 2024-08-24 DIAGNOSIS — C20 Malignant neoplasm of rectum: Secondary | ICD-10-CM

## 2024-08-24 DIAGNOSIS — C801 Malignant (primary) neoplasm, unspecified: Secondary | ICD-10-CM

## 2024-08-24 DIAGNOSIS — Z794 Long term (current) use of insulin: Secondary | ICD-10-CM

## 2024-08-24 DIAGNOSIS — I110 Hypertensive heart disease with heart failure: Secondary | ICD-10-CM

## 2024-08-24 DIAGNOSIS — E1142 Type 2 diabetes mellitus with diabetic polyneuropathy: Secondary | ICD-10-CM

## 2024-08-24 DIAGNOSIS — I214 Non-ST elevation (NSTEMI) myocardial infarction: Secondary | ICD-10-CM | POA: Diagnosis not present

## 2024-08-24 DIAGNOSIS — D611 Drug-induced aplastic anemia: Secondary | ICD-10-CM

## 2024-08-24 LAB — POCT LIPID PANEL
HDL: 43
LDL: 71
Non-HDL: 89
TC: 132
TRG: 89

## 2024-08-24 MED ORDER — REPATHA SURECLICK 140 MG/ML ~~LOC~~ SOAJ: 140.0000 mg | SUBCUTANEOUS | 2 refills | Status: AC

## 2024-08-24 NOTE — Assessment & Plan Note (Addendum)
 Blood sugar levels generally well-controlled with occasional spikes postprandially. Off metformin , currently on farxiga .  - continue farxiga .

## 2024-08-24 NOTE — Telephone Encounter (Signed)
"  Patients wife picked up patient assistance.   "

## 2024-08-24 NOTE — Progress Notes (Unsigned)
 " [facility]  [address] [place],  [place]  [address] [phone]  Clinic Day:  08/10/2024  Referring physician: Sherre Clapper, MD   HISTORY OF PRESENT ILLNESS:  The patient is a 74 y.o. male with  metastatic rectal cancer, including spread of disease to his liver.  He comes in today to be evaluated before heading into his 19th cycle of FOLFOX/Avastin .  He states he tolerated his 16th cycle well.  His peripheral neuropathy has not progressed over time.  His oxaliplatin  dose has already been decreased by 50+%.  He continues to deny having any GI issues which concern him for overt signs of disease progression.  VITALS:  There were no vitals taken for this visit. Wt Readings from Last 3 Encounters:  08/24/24 170 lb (77.1 kg)  08/20/24 168 lb 9.6 oz (76.5 kg)  08/10/24 166 lb 1.6 oz (75.3 kg)   There is no height or weight on file to calculate BMI.  Performance status (ECOG): 1 - Symptomatic but completely ambulatory  PHYSICAL EXAM:   Physical Exam Vitals and nursing note reviewed.  Constitutional:      General: He is not in acute distress.    Appearance: Normal appearance. He is normal weight.  HENT:     Head: Normocephalic and atraumatic.     Mouth/Throat:     Mouth: Mucous membranes are moist.     Pharynx: Oropharynx is clear. No oropharyngeal exudate or posterior oropharyngeal erythema.  Eyes:     General: No scleral icterus.    Extraocular Movements: Extraocular movements intact.     Conjunctiva/sclera: Conjunctivae normal.     Pupils: Pupils are equal, round, and reactive to light.  Cardiovascular:     Rate and Rhythm: Normal rate and regular rhythm.     Heart sounds: Normal heart sounds. No murmur heard.    No friction rub. No gallop.  Pulmonary:     Effort: Pulmonary effort is normal.     Breath sounds: Normal breath sounds. No wheezing, rhonchi or rales.  Abdominal:     General: Bowel sounds are normal. There is no distension.      Palpations: Abdomen is soft. There is no hepatomegaly, splenomegaly or mass.     Tenderness: There is no abdominal tenderness.  Musculoskeletal:        General: Normal range of motion.     Cervical back: Normal range of motion and neck supple. No tenderness.     Right lower leg: No edema.     Left lower leg: No edema.  Lymphadenopathy:     Cervical: No cervical adenopathy.     Upper Body:     Right upper body: No supraclavicular or axillary adenopathy.     Left upper body: No supraclavicular or axillary adenopathy.     Lower Body: No right inguinal adenopathy. No left inguinal adenopathy.  Skin:    General: Skin is warm and dry.     Coloration: Skin is not jaundiced.     Findings: No rash.  Neurological:     Mental Status: He is alert and oriented to person, place, and time.     Cranial Nerves: No cranial nerve deficit.  Psychiatric:        Mood and Affect: Mood normal.        Behavior: Behavior normal.        Thought Content: Thought content normal.    LABS:      Latest Ref Rng & Units 08/20/2024  8:36 AM 08/10/2024    8:56 AM 08/04/2024    5:02 AM  CBC  WBC 3.4 - 10.8 x10E3/uL 3.1  4.5  3.5   Hemoglobin 13.0 - 17.7 g/dL 89.9  89.9  9.8   Hematocrit 37.5 - 51.0 % 32.2  31.8  30.2   Platelets 150 - 450 x10E3/uL 96  114  117       Latest Ref Rng & Units 08/20/2024    8:36 AM 08/10/2024    8:56 AM 08/04/2024    5:02 AM  CMP  Glucose 70 - 99 mg/dL 874  851  84   BUN 8 - 27 mg/dL 16  14  10    Creatinine 0.76 - 1.27 mg/dL 9.10  9.10  9.33   Sodium 134 - 144 mmol/L 139  137  137   Potassium 3.5 - 5.2 mmol/L 4.7  4.4  4.0   Chloride 96 - 106 mmol/L 106  105  102   CO2 20 - 29 mmol/L 23  25  25    Calcium  8.6 - 10.2 mg/dL 9.1  9.2  8.9   Total Protein 6.0 - 8.5 g/dL 6.8  7.5    Total Bilirubin 0.0 - 1.2 mg/dL 0.5  0.6    Alkaline Phos 47 - 123 IU/L 50  54    AST 0 - 40 IU/L 24  34    ALT 0 - 44 IU/L 16  13     Lab Results  Component Value Date   CEA 5.27 (H)  07/14/2024   CEA 7.04 (H) 02/06/2024   ASSESSMENT & PLAN:  Assessment/Plan:  A 74 y.o. male with metastatic rectal cancer including spread of his disease to his liver. Before completing cycle 17, he was evaluated in the ED for chest pain/ dyspnea. His 5-FU was stopped and he was evaluated by [facility] where he underwent placement of 2 stents. He presents today and states he feels better than he has in a long time. After review with Dr. Ezzard and Dr. Edwyna, he will proceed with cycle 18 today. He will return to clinic in 2 weeks for repeat evaluation prior to cycle 19. The patient understands all the plans discussed today and is in agreement with them.  Emalie Mcwethy DELENA Ezzard, MD       "

## 2024-08-24 NOTE — Progress Notes (Unsigned)
 "  Subjective:  Patient ID: Joshua Nelson, male    DOB: [birth year]  Age: 74 y.o. MRN: [id number]  Chief Complaint  Patient presents with   Medical Management of Chronic Issues    HPI: Discussed the use of AI scribe software for clinical note transcription with the patient, who gave verbal consent to proceed.  History of Present Illness Joshua Nelson is a 74 year old male with coronary artery disease who presents for follow-up after a recent heart attack. He is accompanied by his wife.     Coronary artery disease and myocardial infarction - Recent hospitalization for NSTEMI, required two stents placed - Prior to event, experienced one week of intermittent left-sided chest pressure and shortness of breath - Initial EKG was fast but nonspecific - CTA chest ruled out pulmonary embolism or aortic aneurysm - Hospitalized from 07/31/2024-08/04/2024, initially at [facility], then transferred to [facility] - Currently asymptomatic: no chest pain, shortness of breath, or swelling  Dyslipidemia - Continues Crestor  and Zetia  for cholesterol management  Heart failure and cardiac medications - Current medications: metoprolol  50 mg, Entresto , spironolactone , Brilinta  - Lisinopril  discontinued with initiation of Entresto   Diabetes mellitus - Blood glucose levels generally stable, occasionally spike to 200 mg/dL - No longer on metformin  - Currently taking Farxiga  for diabetes management - Has CGM (previously on insulin  and ozempic .)  Hematologic abnormalities - History of low blood counts: platelets, white blood cells, and hemoglobin - Counts are being monitored - Currently undergoing chemotherapy for 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, which may contribute to cytopenias  Recent pneumonia - Treated for pneumonia during recent hospitalization - Required oxygen for approximately one week - No longer requires oxygen support - No current fevers, chills, sweats, earaches, sore throat, or  stuffy nose  Weight management - Actively working on weight loss with some success       08/12/2024   12:46 PM 08/10/2024    9:00 AM 06/30/2024    9:00 AM 06/16/2024   10:00 AM 06/02/2024    9:00 AM  Depression screen PHQ 2/9  Decreased Interest 0 0 0 0 0  Down, Depressed, Hopeless 0 0 0 0 0  PHQ - 2 Score 0 0 0 0 0        04/30/2024    8:48 AM  Fall Risk   Falls in the past year? 0  Number falls in past yr: 0  Injury with Fall? 0   Risk for fall due to : No Fall Risks  Follow up Falls prevention discussed;Education provided;Falls evaluation completed     Data saved with a previous flowsheet row definition    Patient Care Team: Sherre Clapper, MD as PCP - General (Family Medicine) Elisabeth Valli BIRCH, MD as Consulting Physician (Urology) Corlis Sharlet PARAS, RN as Case Manager (General Practice) Ezzard Valaria LABOR, MD as Consulting Physician (Oncology) Jomarie Agent, MD as Consulting Physician (Radiation Oncology) [facility] [place], [place]   Review of Systems  Constitutional:  Negative for chills, fatigue and fever.  HENT:  Negative for congestion, ear pain and sore throat.   Respiratory:  Negative for cough and shortness of breath.   Cardiovascular:  Negative for chest pain.  Gastrointestinal:  Negative for abdominal pain, constipation, diarrhea, nausea and vomiting.  Genitourinary:  Negative for dysuria and frequency.  Psychiatric/Behavioral:         No dysphoria    Medications Ordered Prior to Encounter[1] Past Medical History:  Diagnosis Date   Acute CHF (HCC) 07/31/2024  Acute hypoxemic respiratory failure (HCC) 07/31/2024   Arthritis    BMI 27.0-27.9,adult 06/09/2023   BPH with obstruction/lower urinary tract symptoms 08/01/2021   CAD (coronary artery disease) 03/28/2018   CAP (community acquired pneumonia) 07/31/2024   Chronic left shoulder pain 11/30/2019   Diabetes mellitus without complication (HCC)    Diabetic polyneuropathy associated with type 2 diabetes  mellitus (HCC) 11/30/2019   Elevated lipase 07/31/2024   Encounter for central line care 01/02/2024   Encounter for immunization 06/09/2023   Hypertension    Hypertensive heart disease without heart failure 11/30/2019   Insomnia    Mixed hyperlipidemia 11/30/2019   Myocardial infarct [facility])    NSTEMI (non-ST elevated myocardial infarction) (HCC) 07/31/2024   Obstructive sleep apnea 04/03/2018   PONV (postoperative nausea and vomiting)    Prostate cancer (HCC) 07/2021   Rectal bleeding 09/21/2023   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) 10/30/2023   Urge incontinence 07/12/2022   Vertigo 01/19/2024   Past Surgical History:  Procedure Laterality Date   CARDIAC CATHETERIZATION     THREE CARDIAC STENTS PLACED   COLONOSCOPY W/ POLYPECTOMY     CORONARY STENT INTERVENTION N/A 08/03/2024   Procedure: CORONARY STENT INTERVENTION;  Surgeon: Verlin Lonni BIRCH, MD;  Location: [facility];  Service: Cardiovascular;  Laterality: N/A;   CYSTOSCOPY WITH URETHRAL DILATATION N/A 08/01/2021   Procedure: CYSTOSCOPY WITH BALLOON URETHRAL DILATATION;  Surgeon: Elisabeth Valli BIRCH, MD;  Location: [facility] ORS;  Service: Urology;  Laterality: N/A;   left cataract  10/2019   NOSE SURGERY     RESULT OF DOG BITE DURING CHILDHOOD   PROSTATE BIOPSY     RIGHT/LEFT HEART CATH AND CORONARY ANGIOGRAPHY N/A 08/03/2024   Procedure: RIGHT/LEFT HEART CATH AND CORONARY ANGIOGRAPHY;  Surgeon: Verlin Lonni BIRCH, MD;  Location: [facility];  Service: Cardiovascular;  Laterality: N/A;   TRANSURETHRAL RESECTION OF BLADDER TUMOR N/A 08/01/2021   Procedure: TRANSURETHRAL RESECTION OF PROSTATE;  Surgeon: Elisabeth Valli BIRCH, MD;  Location: [facility];  Service: Urology;  Laterality: N/A;  90 MINS   URETHRAL STRICTURE DILATATION      Family History  Problem Relation Age of Onset   Endometrial cancer Mother    CAD Mother    AAA (abdominal aortic aneurysm) Mother    Dementia Father    Anxiety disorder Father     Depression Father    Heart disease Father    Hypertension Father    Deep vein thrombosis Father    Kidney Stones Sister        H/O : INTESTINAL BLOCKAGE   Barrett's esophagus Sister    Hyperlipidemia Sister    Kidney Stones Brother    Heart attack Brother    Colon cancer Neg Hx    Esophageal cancer Neg Hx    Liver cancer Neg Hx    Stomach cancer Neg Hx    Social History   Socioeconomic History   Marital status: Married    Spouse name: Nathanel   Number of children: 1   Years of education: 12   Highest education level: 12th grade  Occupational History   Occupation: RETIRED - TRUCK DRIVER  Tobacco Use   Smoking status: Former    Current packs/day: 0.00    Types: Cigarettes    Quit date: 2003    Years since quitting: 23.0   Smokeless tobacco: Never  Vaping Use   Vaping status: Never Used  Substance and Sexual Activity   Alcohol use: Not Currently  Comment: none since 2000   Drug use: Never   Sexual activity: Not Currently  Other Topics Concern   Not on file  Social History Narrative   Lives at home with his wife.   2 cups caffeine per day.   Right-handed.   Social Drivers of Health   Tobacco Use: Medium Risk (08/30/2024)   Patient History    Smoking Tobacco Use: Former    Smokeless Tobacco Use: Never    Passive Exposure: Not on file  Financial Resource Strain: Low Risk (04/30/2024)   Overall Financial Resource Strain (CARDIA)    Difficulty of Paying Living Expenses: Not hard at all  Food Insecurity: No Food Insecurity (08/01/2024)   Epic    Worried About Programme Researcher, Broadcasting/film/video in the Last Year: Never true    Ran Out of Food in the Last Year: Never true  Transportation Needs: No Transportation Needs (08/01/2024)   Epic    Lack of Transportation (Medical): No    Lack of Transportation (Non-Medical): No  Physical Activity: Inactive (04/30/2024)   Exercise Vital Sign    Days of Exercise per Week: 0 days    Minutes of Exercise per Session: 0 min  Stress: No  Stress Concern Present (04/30/2024)   [employer] of Occupational Health - Occupational Stress Questionnaire    Feeling of Stress: Only a little  Social Connections: Socially Integrated (08/01/2024)   Social Connection and Isolation Panel    Frequency of Communication with Friends and Family: More than three times a week    Frequency of Social Gatherings with Friends and Family: More than three times a week    Attends Religious Services: More than 4 times per year    Active Member of Clubs or Organizations: Yes    Attends Banker Meetings: More than 4 times per year    Marital Status: Married  Depression (PHQ2-9): Low Risk (08/12/2024)   Depression (PHQ2-9)    PHQ-2 Score: 0  Alcohol Screen: Low Risk (04/30/2024)   Alcohol Screen    Last Alcohol Screening Score (AUDIT): 0  Housing: Low Risk (08/01/2024)   Epic    Unable to Pay for Housing in the Last Year: No    Number of Times Moved in the Last Year: 0    Homeless in the Last Year: No  Utilities: Not At Risk (08/01/2024)   Epic    Threatened with loss of utilities: No  Health Literacy: Adequate Health Literacy (04/30/2024)   B1300 Health Literacy    Frequency of need for help with medical instructions: Never    Objective:  BP 134/70   Pulse 67   Temp 97.8 F (36.6 C)   Ht 5' 9 (1.753 m)   Wt 170 lb (77.1 kg)   SpO2 100%   BMI 25.10 kg/m      08/27/2024    2:13 PM 08/25/2024    2:20 PM 08/25/2024   10:19 AM  BP/Weight  Systolic BP 144 133 139  Diastolic BP 66 64 71  Wt. (Lbs)   169.1  BMI   24.97 kg/m2    Physical Exam Vitals reviewed.  Constitutional:      Appearance: Normal appearance.  Neck:     Vascular: No carotid bruit.  Cardiovascular:     Rate and Rhythm: Normal rate and regular rhythm.     Pulses: Normal pulses.     Heart sounds: Normal heart sounds.  Pulmonary:     Effort: Pulmonary effort is normal.  Breath sounds: Normal breath sounds. No wheezing, rhonchi or rales.   Abdominal:     General: Bowel sounds are normal.     Palpations: Abdomen is soft.     Tenderness: There is no abdominal tenderness.  Neurological:     Mental Status: He is alert and oriented to person, place, and time.  Psychiatric:        Mood and Affect: Mood normal.        Behavior: Behavior normal.      Diabetic foot exam was performed with the following findings:   No deformities, ulcerations, or other skin breakdown Normal sensation of 10g monofilament Intact posterior tibialis and dorsalis pedis pulses      Lab Results  Component Value Date   WBC 3.9 (L) 08/25/2024   HGB 9.9 (L) 08/25/2024   HCT 30.8 (L) 08/25/2024   PLT 75 (L) 08/25/2024   GLUCOSE 131 (H) 08/25/2024   CHOL 133 01/13/2024   TRIG 87 01/13/2024   HDL 42 01/13/2024   LDLCALC 74 01/13/2024   ALT 13 08/25/2024   AST 28 08/25/2024   NA 140 08/25/2024   K 3.8 08/25/2024   CL 108 08/25/2024   CREATININE 0.83 08/25/2024   BUN 19 08/25/2024   CO2 24 08/25/2024   TSH 1.180 09/16/2023   INR 1.1 11/05/2023   HGBA1C 5.3 08/01/2024    Results for orders placed or performed in visit on 08/24/24  POCT Lipid Panel   Collection Time: 08/24/24  2:17 PM  Result Value Ref Range   TC 132    HDL 43    TRG 89    LDL 71    Non-HDL 89    TC/HDL    .  Assessment & Plan:   Assessment & Plan NSTEMI (non-ST elevated myocardial infarction) (HCC) Hypertensive heart disease with heart failure status post recent myocardial infarction and coronary stenting Recent myocardial infarction with two stents placed. Hospitalized for almost a week. Currently on Entresto , spironolactone , and Brilinta . Metoprolol  increased to 50 mg. Lisinopril  discontinued due to Entresto . No current symptoms of chest pain, shortness of breath, or swelling. - Continue Entresto , spironolactone , Brilinta , and metoprolol . - Continue DAPT with ASA and Brilinta  for one year - Follow up with cardiology in six months.    Hypertensive heart  disease with heart failure (HCC) Hypertensive heart disease with heart failure status post recent myocardial infarction and coronary stenting Recent myocardial infarction with two stents placed. Hospitalized for almost a week. Currently on Entresto , spironolactone , and Brilinta . Metoprolol  increased to 50 mg. Lisinopril  discontinued due to Entresto . No current symptoms of chest pain, shortness of breath, or swelling. - Continue Entresto , spironolactone , Brilinta , and metoprolol . - Follow up with cardiology in six months.    DM type 2 with diabetic peripheral neuropathy (HCC) Blood sugar levels generally well-controlled with occasional spikes postprandially. Off metformin , currently on farxiga .  - continue farxiga .   Mixed hyperlipidemia LDL cholesterol was 74, slightly above the target of less than 55 for patients with heart disease. Currently on Crestor  and Zetia . No recent cholesterol check due to focus on cancer treatment. - Checked cholesterol levels today via finger stick. Orders:   POCT Lipid Panel  Pancytopenia due to chemotherapy Anemia secondary to antineoplastic chemotherapy Anemia likely secondary to chemotherapy. Hemoglobin levels are low but not dangerously so. - Continue monitoring blood count with oncologist.    Rectal cancer metastasized to liver [facility]) Management per specialist.       Body mass index is 25.1 kg/m.  Meds ordered this encounter  Medications   Evolocumab  (REPATHA  SURECLICK) 140 MG/ML SOAJ    Sig: Inject 140 mg into the skin every 14 (fourteen) days.    Dispense:  2 mL    Refill:  2   furosemide  (LASIX ) 20 MG tablet    Sig: Take 1 tablet (20 mg total) by mouth daily.    Dispense:  90 tablet    Refill:  0   metoprolol  succinate (TOPROL -XL) 50 MG 24 hr tablet    Sig: Take 1 tablet (50 mg total) by mouth every morning. Take with or immediately following a meal.    Dispense:  90 tablet    Refill:  0   rosuvastatin  (CRESTOR ) 40 MG tablet     Sig: Take 1 tablet (40 mg total) by mouth at bedtime.    Dispense:  90 tablet    Refill:  1   sacubitril -valsartan  (ENTRESTO ) 49-51 MG    Sig: Take 1 tablet by mouth 2 (two) times daily.    Dispense:  180 tablet    Refill:  0   spironolactone  (ALDACTONE ) 25 MG tablet    Sig: Take 1 tablet (25 mg total) by mouth daily.    Dispense:  90 tablet    Refill:  0   ticagrelor  (BRILINTA ) 90 MG TABS tablet    Sig: Take 1 tablet (90 mg total) by mouth 2 (two) times daily.    Dispense:  180 tablet    Refill:  0    Orders Placed This Encounter  Procedures   POCT Lipid Panel     I,Marla I Leal-Borjas,acting as a scribe for Abigail Free, MD.,have documented all relevant documentation on the behalf of Abigail Free, MD,as directed by  Abigail Free, MD while in the presence of Abigail Free, MD.   Follow-up: No follow-ups on file.  An After Visit Summary was printed and given to the patient.  Abigail Free, MD [facility] [phone]     [1]  Current Outpatient Medications on File Prior to Visit  Medication Sig Dispense Refill   aspirin  EC 81 MG tablet Take 81 mg by mouth daily. Swallow whole.     Continuous Glucose Receiver (FREESTYLE LIBRE 3 READER) DEVI Use as directed 1 each 0   Continuous Glucose Sensor (FREESTYLE LIBRE 3 PLUS SENSOR) MISC Change sensor every 15 days. 6 each 3   ezetimibe  (ZETIA ) 10 MG tablet TAKE ONE (1) TABLET ONCE DAILY 90 tablet 1   gabapentin  (NEURONTIN ) 300 MG capsule Take 1 capsule (300 mg total) by mouth 3 (three) times daily. 270 capsule 1   Glucagon  (GVOKE HYPOPEN  2-PACK) 0.5 MG/0.1ML SOAJ Inject 0.5 mg into the skin daily as needed. 0.2 mL 2   meclizine  (ANTIVERT ) 25 MG tablet Take 1 tablet (25 mg total) by mouth 3 (three) times daily as needed for dizziness. 60 tablet 0   OLANZapine  (ZYPREXA ) 5 MG tablet One tab at bedtime prn nauese 21 tablet 1   ondansetron  (ZOFRAN ) 8 MG tablet Take 1 tablet (8 mg total) by mouth every 8 (eight) hours as needed for  nausea or vomiting. 40 tablet 1   prochlorperazine  (COMPAZINE ) 10 MG tablet Take 1 tablet (10 mg total) by mouth every 6 (six) hours as needed for nausea or vomiting. 90 tablet 3   FARXIGA  10 MG TABS tablet Take 1 tablet (10 mg total) by mouth daily. 90 tablet 0   Current Facility-Administered Medications on File Prior to Visit  Medication Dose Route Frequency Provider  Last Rate Last Admin   sodium chloride  flush (NS) 0.9 % injection 10 mL  10 mL Intracatheter PRN Ezzard, Dequincy A, MD   10 mL at 02/06/24 0933   "

## 2024-08-24 NOTE — Assessment & Plan Note (Addendum)
 Hypertensive heart disease with heart failure status post recent myocardial infarction and coronary stenting Recent myocardial infarction with two stents placed. Hospitalized for almost a week. Currently on Entresto , spironolactone , and Brilinta . Metoprolol  increased to 50 mg. Lisinopril  discontinued due to Entresto . No current symptoms of chest pain, shortness of breath, or swelling. - Continue Entresto , spironolactone , Brilinta , and metoprolol . - Follow up with cardiology in six months.

## 2024-08-25 ENCOUNTER — Inpatient Hospital Stay

## 2024-08-25 ENCOUNTER — Inpatient Hospital Stay: Admitting: Oncology

## 2024-08-25 ENCOUNTER — Inpatient Hospital Stay: Attending: Oncology

## 2024-08-25 VITALS — BP 139/71 | HR 66 | Temp 97.5°F | Resp 16 | Ht 69.0 in | Wt 169.1 lb

## 2024-08-25 VITALS — BP 133/64 | HR 64 | Resp 18

## 2024-08-25 DIAGNOSIS — C20 Malignant neoplasm of rectum: Secondary | ICD-10-CM | POA: Insufficient documentation

## 2024-08-25 DIAGNOSIS — K769 Liver disease, unspecified: Secondary | ICD-10-CM | POA: Diagnosis not present

## 2024-08-25 DIAGNOSIS — G629 Polyneuropathy, unspecified: Secondary | ICD-10-CM | POA: Insufficient documentation

## 2024-08-25 DIAGNOSIS — Z79899 Other long term (current) drug therapy: Secondary | ICD-10-CM | POA: Diagnosis not present

## 2024-08-25 DIAGNOSIS — C787 Secondary malignant neoplasm of liver and intrahepatic bile duct: Secondary | ICD-10-CM | POA: Diagnosis not present

## 2024-08-25 DIAGNOSIS — Z5111 Encounter for antineoplastic chemotherapy: Secondary | ICD-10-CM | POA: Insufficient documentation

## 2024-08-25 LAB — CMP (CANCER CENTER ONLY)
ALT: 13 U/L (ref 0–44)
AST: 28 U/L (ref 15–41)
Albumin: 4 g/dL (ref 3.5–5.0)
Alkaline Phosphatase: 51 U/L (ref 38–126)
Anion gap: 8 (ref 5–15)
BUN: 19 mg/dL (ref 8–23)
CO2: 24 mmol/L (ref 22–32)
Calcium: 9.5 mg/dL (ref 8.9–10.3)
Chloride: 108 mmol/L (ref 98–111)
Creatinine: 0.83 mg/dL (ref 0.61–1.24)
GFR, Estimated: >60 mL/min
Glucose, Bld: 131 mg/dL — ABNORMAL HIGH (ref 70–99)
Potassium: 3.8 mmol/L (ref 3.5–5.1)
Sodium: 140 mmol/L (ref 135–145)
Total Bilirubin: 0.5 mg/dL (ref 0.0–1.2)
Total Protein: 7.2 g/dL (ref 6.5–8.1)

## 2024-08-25 LAB — CBC WITH DIFFERENTIAL (CANCER CENTER ONLY)
Abs Immature Granulocytes: 0.01 K/uL (ref 0.00–0.07)
Basophils Absolute: 0 K/uL (ref 0.0–0.1)
Basophils Relative: 0 %
Eosinophils Absolute: 0.1 K/uL (ref 0.0–0.5)
Eosinophils Relative: 3 %
HCT: 30.8 % — ABNORMAL LOW (ref 39.0–52.0)
Hemoglobin: 9.9 g/dL — ABNORMAL LOW (ref 13.0–17.0)
Immature Granulocytes: 0 %
Lymphocytes Relative: 20 %
Lymphs Abs: 0.8 K/uL (ref 0.7–4.0)
MCH: 31.4 pg (ref 26.0–34.0)
MCHC: 32.1 g/dL (ref 30.0–36.0)
MCV: 97.8 fL (ref 80.0–100.0)
Monocytes Absolute: 0.5 K/uL (ref 0.1–1.0)
Monocytes Relative: 12 %
Neutro Abs: 2.6 K/uL (ref 1.7–7.7)
Neutrophils Relative %: 65 %
Platelet Count: 75 K/uL — ABNORMAL LOW (ref 150–400)
RBC: 3.15 MIL/uL — ABNORMAL LOW (ref 4.22–5.81)
RDW: 17.2 % — ABNORMAL HIGH (ref 11.5–15.5)
WBC Count: 3.9 K/uL — ABNORMAL LOW (ref 4.0–10.5)
nRBC: 0 % (ref 0.0–0.2)

## 2024-08-25 LAB — TOTAL PROTEIN, URINE DIPSTICK: Protein, ur: NEGATIVE mg/dL

## 2024-08-25 MED ORDER — SODIUM CHLORIDE 0.9 % IV SOLN: INTRAVENOUS | Status: DC

## 2024-08-25 MED ORDER — SODIUM CHLORIDE 0.9 % IV SOLN
5.0000 mg/kg | Freq: Once | INTRAVENOUS | Status: AC
  Administered 2024-08-25: 400 mg via INTRAVENOUS
  Filled 2024-08-25: qty 16

## 2024-08-25 MED ORDER — SODIUM CHLORIDE 0.9 % IV SOLN
150.0000 mg | Freq: Once | INTRAVENOUS | Status: AC
  Administered 2024-08-25: 150 mg via INTRAVENOUS
  Filled 2024-08-25: qty 150

## 2024-08-25 MED ORDER — DIPHENHYDRAMINE HCL 50 MG/ML IJ SOLN
25.0000 mg | Freq: Once | INTRAMUSCULAR | Status: AC
  Administered 2024-08-25: 25 mg via INTRAVENOUS
  Filled 2024-08-25: qty 1

## 2024-08-25 MED ORDER — PALONOSETRON HCL INJECTION 0.25 MG/5ML
0.2500 mg | Freq: Once | INTRAVENOUS | Status: AC
  Administered 2024-08-25: 0.25 mg via INTRAVENOUS
  Filled 2024-08-25: qty 5

## 2024-08-25 MED ORDER — DEXTROSE 5 % IV SOLN: INTRAVENOUS | Status: DC

## 2024-08-25 MED ORDER — OXALIPLATIN CHEMO INJECTION 100 MG/20ML
35.0000 mg/m2 | Freq: Once | INTRAVENOUS | Status: AC
  Administered 2024-08-25: 70 mg via INTRAVENOUS
  Filled 2024-08-25: qty 14

## 2024-08-25 MED ORDER — FAMOTIDINE IN NACL 20-0.9 MG/50ML-% IV SOLN
20.0000 mg | Freq: Once | INTRAVENOUS | Status: AC
  Administered 2024-08-25: 20 mg via INTRAVENOUS
  Filled 2024-08-25: qty 50

## 2024-08-25 MED ORDER — DEXAMETHASONE SOD PHOSPHATE PF 10 MG/ML IJ SOLN
10.0000 mg | Freq: Once | INTRAMUSCULAR | Status: AC
  Administered 2024-08-25: 10 mg via INTRAVENOUS
  Filled 2024-08-25: qty 1

## 2024-08-25 MED ORDER — FLUOROURACIL CHEMO INJECTION 2.5 GM/50ML
320.0000 mg/m2 | Freq: Once | INTRAVENOUS | Status: AC
  Administered 2024-08-25: 650 mg via INTRAVENOUS
  Filled 2024-08-25: qty 13

## 2024-08-25 MED ORDER — LEUCOVORIN CALCIUM INJECTION 350 MG
320.0000 mg/m2 | Freq: Once | INTRAVENOUS | Status: AC
  Administered 2024-08-25: 652 mg via INTRAVENOUS
  Filled 2024-08-25: qty 32.6

## 2024-08-25 MED ORDER — SODIUM CHLORIDE 0.9 % IV SOLN
1920.0000 mg/m2 | INTRAVENOUS | Status: DC
  Administered 2024-08-25: 3900 mg via INTRAVENOUS
  Filled 2024-08-25: qty 78

## 2024-08-25 NOTE — Patient Instructions (Signed)
 [facility] - A DEPT OF [facility]  Discharge Instructions: Thank you for choosing [facility] to provide your oncology and hematology care.  If you have a lab appointment with the Cancer Center, please go directly to the Cancer Center and check in at the registration area.   Wear comfortable clothing and clothing appropriate for easy access to any Portacath or PICC line.   We strive to give you quality time with your provider. You may need to reschedule your appointment if you arrive late (15 or more minutes).  Arriving late affects you and other patients whose appointments are after yours.  Also, if you miss three or more appointments without notifying the office, you may be dismissed from the clinic at the provider's discretion.      For prescription refill requests, have your pharmacy contact our office and allow 72 hours for refills to be completed.    Today you received the following chemotherapy and/or immunotherapy agents    To help prevent nausea and vomiting after your treatment, we encourage you to take your nausea medication as directed.  BELOW ARE SYMPTOMS THAT SHOULD BE REPORTED IMMEDIATELY: *FEVER GREATER THAN 100.4 F (38 C) OR HIGHER *CHILLS OR SWEATING *NAUSEA AND VOMITING THAT IS NOT CONTROLLED WITH YOUR NAUSEA MEDICATION *UNUSUAL SHORTNESS OF BREATH *UNUSUAL BRUISING OR BLEEDING *URINARY PROBLEMS (pain or burning when urinating, or frequent urination) *BOWEL PROBLEMS (unusual diarrhea, constipation, pain near the anus) TENDERNESS IN MOUTH AND THROAT WITH OR WITHOUT PRESENCE OF ULCERS (sore throat, sores in mouth, or a toothache) UNUSUAL RASH, SWELLING OR PAIN  UNUSUAL VAGINAL DISCHARGE OR ITCHING   Items with * indicate a potential emergency and should be followed up as soon as possible or go to the Emergency Department if any problems should occur.  Please show the CHEMOTHERAPY ALERT CARD or IMMUNOTHERAPY ALERT CARD at  check-in to the Emergency Department and triage nurse.  Should you have questions after your visit or need to cancel or reschedule your appointment, please contact [facility]  - A DEPT OF [facility]  Dept: [phone]  and follow the prompts.  Office hours are 8:00 a.m. to 4:30 p.m. Monday - Friday. Please note that voicemails left after 4:00 p.m. may not be returned until the following business day.  We are closed weekends and major holidays. You have access to a nurse at all times for urgent questions. Please call the main number to the clinic Dept: [phone] and follow the prompts.  For any non-urgent questions, you may also contact your provider using MyChart. We now offer e-Visits for anyone 55 and older to request care online for non-urgent symptoms. For details visit [email or web address].   Also download the MyChart app! Go to the app store, search MyChart, open the app, select [facility], and log in with your MyChart username and password.

## 2024-08-27 ENCOUNTER — Inpatient Hospital Stay

## 2024-08-27 VITALS — BP 144/66 | HR 71 | Temp 98.0°F | Resp 18

## 2024-08-27 DIAGNOSIS — Z5111 Encounter for antineoplastic chemotherapy: Secondary | ICD-10-CM | POA: Diagnosis not present

## 2024-08-27 DIAGNOSIS — C20 Malignant neoplasm of rectum: Secondary | ICD-10-CM

## 2024-08-27 NOTE — Patient Instructions (Signed)
 Managing Chemotherapy Side Effects, Adult Chemotherapy is a treatment that uses medicine to kill cancer cells. However, in addition to killing cancer cells, the medicines can also damage healthy cells. The damage to healthy cells can lead to side effects. The exact side effects depend on the specific medicines used. Most of the side effects of chemotherapy go away once treatment is finished. Until then, work closely with your health care providers and take an active role in managing your side effects. What are common side effects of chemotherapy? Increased risk of infection, bruising, or bleeding. Nausea and vomiting. Constipation or diarrhea. Loss of appetite. Hair loss. Mouth or throat sores. Tiredness (fatigue). Tingling, pain, or numbness in the hands and feet. Dry, sensitive, itchy, or sore skin. Sleep disturbances, such as excessive sleepiness. Confusion, anxiety, or mood swings. Memory changes. How to manage the side effects of chemotherapy Medicines Take over-the-counter and prescription medicines only as told by your health care provider. Talk with your health care provider before taking vitamins, herbs, supplements, or over-the-counter medicines. Some of these can interfere with chemotherapy. Activity Get plenty of rest. Get regular exercise by doing activities such as walking, gentle yoga, or tai chi. Return to your normal activities as told by your health care provider. Ask your health care provider what activities are safe for you. Eating and drinking  Talk to a dietitian about what you should eat and drink during cancer treatment. Drink enough fluid to keep your urine pale yellow. If you have side effects that affect eating, these tips may help: Eat smaller meals and snacks often. Drink high-nutrition and high-calorie shakes or supplements. Choose bland and soft foods that are easy to eat. Do not eat foods that are hot, spicy, or hard to swallow. Do not eat raw or  undercooked meat, eggs, or seafood. Always wash fresh fruits and vegetables well before eating them. Skin care If you have sore or itchy skin: Wear soft, comfortable clothing. Apply creams and ointments to your skin as told by your health care provider. If you lose your hair, consider wearing a wig, hat, or scarf to cover your head. You may want to have someone shave your head as you start to lose hair. During outdoor activities, protect your head and skin from the sun by using sunscreen with an SPF of 30 or higher or by wearing protective clothing and a hat. Meet with a hair and skin care specialist for makeup and skin care tips. Apply sunscreen to your scalp as told by your health care provider. General tips Learn as much as you can about your condition. If you are struggling emotionally, talk with a mental health care provider or join a support group. Keep all follow-up visits. This is important. How to prevent infection and bleeding Chemotherapy may lower your blood counts and put you at risk for infection and bleeding. Here are some ways to help prevent problems. Vaccines Talk to your health care provider about vaccines. You should not get any live vaccines, such as the polio, MMR, chickenpox, and shingles vaccines until your health care provider says that it is safe to do so. Do not be around people who have had live vaccines for as long as your health care provider recommends. Make sure you get a yearly flu shot. People who will be near you should also get a yearly flu shot. Social activity Stay away from crowded places where you could be exposed to germs. Do not be around people who may be sick or  people who have symptoms of a fever until they have been fever-free for at least 24 hours. Do not share food, cups, straws, or utensils with other people. Wear a mask when outside the home if your blood counts are low. Cleanliness  Wash your hands often for at least 20 seconds. Also make  sure that other members of your household wash their hands often. Brush your teeth twice daily using a soft toothbrush. Use mouth rinse only as told by your health care provider. Take a bath or shower daily unless your health care provider gives different instructions. General tips Take your temperature regularly, especially if you have chills or feel warm. Check with your health care provider: Before you travel. Before you have a dental procedure. Before you use a swimming pool, hot tub, or swim in a lake or ocean. If you get chemotherapy through an IV or port, check the site every day for signs of infection. Check for redness, swelling, pain, fluid, and warmth. Avoid activities that put you at risk for injury or bleeding. Use an electric razor to shave instead of a blade. Questions to ask your health care provider What are the most common side effects of my treatment? How will they affect my daily life? What can I do to manage them? What are some possible long-term side effects? What are possible complications? What support services are available? What number can I call with questions or concerns? Where to find support Cancer affects the entire family. Find out what family support resources are available from your cancer treatment center. For more support, turn to: Your cancer care team. Friends and family. Your religious community. Other people with cancer. Community-based or online support groups. Where to find more information National Cancer Institute: www.cancer.gov American Cancer Society: www.cancer.org Contact a health care provider if: You bleed or bruise more often. You notice blood in your urine or stool. You have any of these symptoms: A skin rash, or dry or itchy skin. A headache or stiff neck. Cold or flu symptoms. A cough. Persistent nausea or vomiting. Persistent diarrhea. Frequent urination, burning when passing urine, or foul-smelling urine. You cannot eat  because of mouth or throat pain. You are sad, confused, anxious, or depressed. Get help right away if: You have any of these symptoms: A fever or chills. Your health care provider should know about this right away. Redness, swelling, pain, fluid, or warmth near an IV site. Bleeding that you cannot stop. A seizure. You cannot swallow. You have chest pain. You have trouble breathing. A family member or caregiver should get help right away if you have a sudden or unusual change in behavior. These symptoms may be an emergency. Get help right away. Call 911. Do not wait to see if the symptoms will go away. Do not drive yourself to the hospital. Summary Chemotherapy is a treatment that uses medicine to kill cancer cells and can cause side effects. The specific side effects depend on the specific medicines used. Learn as much as you can about your condition. Ask about side effects to watch for and how to treat them. Seek out support and resources from others. Find out what family support resources are available from your cancer treatment center. Let your health care provider know if you notice any new, unusual, or worsening symptoms, especially fever or chills. This information is not intended to replace advice given to you by your health care provider. Make sure you discuss any questions you have with your health  care provider. Document Revised: 07/20/2021 Document Reviewed: 07/20/2021 Elsevier Patient Education  2024 [employer].

## 2024-08-29 DIAGNOSIS — C801 Malignant (primary) neoplasm, unspecified: Secondary | ICD-10-CM | POA: Insufficient documentation

## 2024-08-29 DIAGNOSIS — D611 Drug-induced aplastic anemia: Secondary | ICD-10-CM | POA: Insufficient documentation

## 2024-08-29 NOTE — Assessment & Plan Note (Signed)
 Malignant neoplasm on chemotherapy Currently undergoing chemotherapy. Blood counts are low, including platelets, white blood cells, and hemoglobin, likely due to chemotherapy. No fevers, chills, or other signs of infection. - Continue monitoring blood counts with oncologist.

## 2024-08-29 NOTE — Assessment & Plan Note (Signed)
 Anemia secondary to antineoplastic chemotherapy Anemia likely secondary to chemotherapy. Hemoglobin levels are low but not dangerously so. - Continue monitoring hemoglobin levels with oncologist.

## 2024-08-29 NOTE — Assessment & Plan Note (Signed)
 LDL cholesterol was 74, slightly above the target of less than 55 for patients with heart disease. Currently on Crestor  and Zetia . No recent cholesterol check due to focus on cancer treatment. - Checked cholesterol levels today via finger stick. Orders:   POCT Lipid Panel

## 2024-08-29 NOTE — Assessment & Plan Note (Signed)
 Hypertensive heart disease with heart failure status post recent myocardial infarction and coronary stenting Recent myocardial infarction with two stents placed. Hospitalized for almost a week. Currently on Entresto , spironolactone , and Brilinta . Metoprolol  increased to 50 mg. Lisinopril  discontinued due to Entresto . No current symptoms of chest pain, shortness of breath, or swelling. - Continue Entresto , spironolactone , Brilinta , and metoprolol . - Follow up with cardiology in six months.

## 2024-08-30 ENCOUNTER — Encounter: Payer: Self-pay | Admitting: Family Medicine

## 2024-08-30 MED ORDER — TICAGRELOR 90 MG PO TABS: 90.0000 mg | ORAL_TABLET | Freq: Two times a day (BID) | ORAL | 0 refills | Status: AC

## 2024-08-30 MED ORDER — SPIRONOLACTONE 25 MG PO TABS: 25.0000 mg | ORAL_TABLET | Freq: Every day | ORAL | 0 refills | Status: AC

## 2024-08-30 MED ORDER — METOPROLOL SUCCINATE ER 50 MG PO TB24: 50.0000 mg | ORAL_TABLET | Freq: Every morning | ORAL | 0 refills | Status: AC

## 2024-08-30 MED ORDER — ROSUVASTATIN CALCIUM 40 MG PO TABS: 40.0000 mg | ORAL_TABLET | Freq: Every day | ORAL | 1 refills | Status: AC

## 2024-08-30 MED ORDER — SACUBITRIL-VALSARTAN 49-51 MG PO TABS: 1.0000 | ORAL_TABLET | Freq: Two times a day (BID) | ORAL | 0 refills | Status: AC

## 2024-08-30 MED ORDER — FUROSEMIDE 20 MG PO TABS: 20.0000 mg | ORAL_TABLET | Freq: Every day | ORAL | 0 refills | Status: AC

## 2024-08-30 NOTE — Assessment & Plan Note (Addendum)
 Management per specialist.

## 2024-08-30 NOTE — Addendum Note (Signed)
 Addended byBETHA SHERRE CLAPPER on: 08/30/2024 03:30 PM   Modules accepted: Level of Service

## 2024-09-07 NOTE — Progress Notes (Unsigned)
 " [facility]  [address] [place],  [place]  [address] [phone]  Clinic Day:  09/08/2024  Referring physician: Sherre Clapper, MD   HISTORY OF PRESENT ILLNESS:  The patient is a 74 y.o. male with  metastatic rectal cancer, including spread of disease to his liver.  He comes in today to be evaluated before heading into his 20th cycle of FOLFOX/Avastin .  He states he tolerated his 19th cycle well.  His peripheral neuropathy has not progressed over time.  His oxaliplatin  dose has already been decreased by 50+%.  He continues to deny having any GI issues which concern him for overt signs of disease progression.  VITALS:  Blood pressure (!) 157/79, pulse 64, temperature 97.9 F (36.6 C), temperature source Oral, resp. rate 16, height 5' 9 (1.753 m), weight 170 lb 14.4 oz (77.5 kg), SpO2 100%. Wt Readings from Last 3 Encounters:  09/08/24 170 lb 14.4 oz (77.5 kg)  08/25/24 169 lb 1.6 oz (76.7 kg)  08/24/24 170 lb (77.1 kg)   Body mass index is 25.24 kg/m.  Performance status (ECOG): 1 - Symptomatic but completely ambulatory  PHYSICAL EXAM:   Physical Exam Vitals and nursing note reviewed.  Constitutional:      General: He is not in acute distress.    Appearance: Normal appearance. He is normal weight.  HENT:     Head: Normocephalic and atraumatic.     Mouth/Throat:     Mouth: Mucous membranes are moist.     Pharynx: Oropharynx is clear. No oropharyngeal exudate or posterior oropharyngeal erythema.  Eyes:     General: No scleral icterus.    Extraocular Movements: Extraocular movements intact.     Conjunctiva/sclera: Conjunctivae normal.     Pupils: Pupils are equal, round, and reactive to light.  Cardiovascular:     Rate and Rhythm: Normal rate and regular rhythm.     Heart sounds: Normal heart sounds. No murmur heard.    No friction rub. No gallop.  Pulmonary:     Effort: Pulmonary effort is normal.     Breath sounds: Normal breath sounds. No  wheezing, rhonchi or rales.  Abdominal:     General: Bowel sounds are normal. There is no distension.     Palpations: Abdomen is soft. There is no hepatomegaly, splenomegaly or mass.     Tenderness: There is no abdominal tenderness.  Musculoskeletal:        General: Normal range of motion.     Cervical back: Normal range of motion and neck supple. No tenderness.     Right lower leg: No edema.     Left lower leg: No edema.  Lymphadenopathy:     Cervical: No cervical adenopathy.     Upper Body:     Right upper body: No supraclavicular or axillary adenopathy.     Left upper body: No supraclavicular or axillary adenopathy.     Lower Body: No right inguinal adenopathy. No left inguinal adenopathy.  Skin:    General: Skin is warm and dry.     Coloration: Skin is not jaundiced.     Findings: Bruising (bilateral forearms) present. No rash.  Neurological:     Mental Status: He is alert and oriented to person, place, and time.     Cranial Nerves: No cranial nerve deficit.  Psychiatric:        Mood and Affect: Mood normal.        Behavior: Behavior normal.  Thought Content: Thought content normal.    LABS:      Latest Ref Rng & Units 09/08/2024   10:14 AM 08/25/2024    9:15 AM 08/20/2024    8:36 AM  CBC  WBC 4.0 - 10.5 K/uL 5.3  3.9  3.1   Hemoglobin 13.0 - 17.0 g/dL 88.8  9.9  89.9   Hematocrit 39.0 - 52.0 % 34.5  30.8  32.2   Platelets 150 - 400 K/uL 101  75  96       Latest Ref Rng & Units 09/08/2024   10:14 AM 08/25/2024    9:15 AM 08/20/2024    8:36 AM  CMP  Glucose 70 - 99 mg/dL 877  868  874   BUN 8 - 23 mg/dL 11  19  16    Creatinine 0.61 - 1.24 mg/dL 9.10  9.16  9.10   Sodium 135 - 145 mmol/L 139  140  139   Potassium 3.5 - 5.1 mmol/L 3.9  3.8  4.7   Chloride 98 - 111 mmol/L 105  108  106   CO2 22 - 32 mmol/L 24  24  23    Calcium  8.9 - 10.3 mg/dL 9.5  9.5  9.1   Total Protein 6.5 - 8.1 g/dL 7.8  7.2  6.8   Total Bilirubin 0.0 - 1.2 mg/dL 0.6  0.5  0.5   Alkaline  Phos 38 - 126 U/L 64  51  50   AST 15 - 41 U/L 25  28  24    ALT 0 - 44 U/L 12  13  16     Lab Results  Component Value Date   CEA 5.27 (H) 07/14/2024   CEA 7.04 (H) 02/06/2024   ASSESSMENT & PLAN:  Assessment/Plan:  A 74 y.o. male with metastatic rectal cancer including spread of his disease to his liver.  He will proceed with his 20th cycle of FOLFOX/Avastin  today.  Clinically, the patient is doing fairly well.  I will see him back in 2 weeks before he heads into his 21st cycle of FOLFOX/Avastin .  A repeat abdominal MRI will be done a day before his next visit to ascertain his new disease baseline after 20 cycles of FOLFOX/Avastin .  The patient understands all the plans discussed today and is in agreement with them.  Joshua Epting DELENA Kerns, MD       "

## 2024-09-08 ENCOUNTER — Inpatient Hospital Stay

## 2024-09-08 ENCOUNTER — Inpatient Hospital Stay: Admitting: Oncology

## 2024-09-08 ENCOUNTER — Other Ambulatory Visit: Payer: Self-pay | Admitting: Oncology

## 2024-09-08 VITALS — BP 159/68 | HR 69

## 2024-09-08 VITALS — BP 157/79 | HR 64 | Temp 97.9°F | Resp 16 | Ht 69.0 in | Wt 170.9 lb

## 2024-09-08 DIAGNOSIS — Z5111 Encounter for antineoplastic chemotherapy: Secondary | ICD-10-CM | POA: Diagnosis not present

## 2024-09-08 DIAGNOSIS — C787 Secondary malignant neoplasm of liver and intrahepatic bile duct: Secondary | ICD-10-CM

## 2024-09-08 DIAGNOSIS — C20 Malignant neoplasm of rectum: Secondary | ICD-10-CM

## 2024-09-08 LAB — CMP (CANCER CENTER ONLY)
ALT: 12 U/L (ref 0–44)
AST: 25 U/L (ref 15–41)
Albumin: 4.1 g/dL (ref 3.5–5.0)
Alkaline Phosphatase: 64 U/L (ref 38–126)
Anion gap: 10 (ref 5–15)
BUN: 11 mg/dL (ref 8–23)
CO2: 24 mmol/L (ref 22–32)
Calcium: 9.5 mg/dL (ref 8.9–10.3)
Chloride: 105 mmol/L (ref 98–111)
Creatinine: 0.89 mg/dL (ref 0.61–1.24)
GFR, Estimated: >60 mL/min
Glucose, Bld: 122 mg/dL — ABNORMAL HIGH (ref 70–99)
Potassium: 3.9 mmol/L (ref 3.5–5.1)
Sodium: 139 mmol/L (ref 135–145)
Total Bilirubin: 0.6 mg/dL (ref 0.0–1.2)
Total Protein: 7.8 g/dL (ref 6.5–8.1)

## 2024-09-08 LAB — CBC WITH DIFFERENTIAL (CANCER CENTER ONLY)
Abs Immature Granulocytes: 0.01 10*3/uL (ref 0.00–0.07)
Basophils Absolute: 0 10*3/uL (ref 0.0–0.1)
Basophils Relative: 1 %
Eosinophils Absolute: 0.2 10*3/uL (ref 0.0–0.5)
Eosinophils Relative: 3 %
HCT: 34.5 % — ABNORMAL LOW (ref 39.0–52.0)
Hemoglobin: 11.1 g/dL — ABNORMAL LOW (ref 13.0–17.0)
Immature Granulocytes: 0 %
Lymphocytes Relative: 16 %
Lymphs Abs: 0.8 10*3/uL (ref 0.7–4.0)
MCH: 31.5 pg (ref 26.0–34.0)
MCHC: 32.2 g/dL (ref 30.0–36.0)
MCV: 98 fL (ref 80.0–100.0)
Monocytes Absolute: 0.5 10*3/uL (ref 0.1–1.0)
Monocytes Relative: 9 %
Neutro Abs: 3.7 10*3/uL (ref 1.7–7.7)
Neutrophils Relative %: 71 %
Platelet Count: 101 10*3/uL — ABNORMAL LOW (ref 150–400)
RBC: 3.52 MIL/uL — ABNORMAL LOW (ref 4.22–5.81)
RDW: 16.7 % — ABNORMAL HIGH (ref 11.5–15.5)
WBC Count: 5.3 10*3/uL (ref 4.0–10.5)
nRBC: 0 % (ref 0.0–0.2)

## 2024-09-08 LAB — TOTAL PROTEIN, URINE DIPSTICK: Protein, ur: NEGATIVE mg/dL

## 2024-09-08 MED ORDER — PALONOSETRON HCL INJECTION 0.25 MG/5ML
0.2500 mg | Freq: Once | INTRAVENOUS | Status: AC
  Administered 2024-09-08: 0.25 mg via INTRAVENOUS
  Filled 2024-09-08: qty 5

## 2024-09-08 MED ORDER — FLUOROURACIL CHEMO INJECTION 2.5 GM/50ML
320.0000 mg/m2 | Freq: Once | INTRAVENOUS | Status: AC
  Administered 2024-09-08: 650 mg via INTRAVENOUS
  Filled 2024-09-08: qty 13

## 2024-09-08 MED ORDER — SODIUM CHLORIDE 0.9 % IV SOLN: INTRAVENOUS | Status: DC

## 2024-09-08 MED ORDER — SODIUM CHLORIDE 0.9 % IV SOLN
5.0000 mg/kg | Freq: Once | INTRAVENOUS | Status: AC
  Administered 2024-09-08: 400 mg via INTRAVENOUS
  Filled 2024-09-08: qty 16

## 2024-09-08 MED ORDER — FAMOTIDINE IN NACL 20-0.9 MG/50ML-% IV SOLN
20.0000 mg | Freq: Once | INTRAVENOUS | Status: AC
  Administered 2024-09-08: 20 mg via INTRAVENOUS
  Filled 2024-09-08: qty 50

## 2024-09-08 MED ORDER — LEUCOVORIN CALCIUM INJECTION 350 MG
320.0000 mg/m2 | Freq: Once | INTRAVENOUS | Status: AC
  Administered 2024-09-08: 652 mg via INTRAVENOUS
  Filled 2024-09-08: qty 32.6

## 2024-09-08 MED ORDER — SODIUM CHLORIDE 0.9 % IV SOLN
1920.0000 mg/m2 | INTRAVENOUS | Status: DC
  Administered 2024-09-08: 3900 mg via INTRAVENOUS
  Filled 2024-09-08: qty 78

## 2024-09-08 MED ORDER — DEXAMETHASONE SOD PHOSPHATE PF 10 MG/ML IJ SOLN
10.0000 mg | Freq: Once | INTRAMUSCULAR | Status: AC
  Administered 2024-09-08: 10 mg via INTRAVENOUS
  Filled 2024-09-08: qty 1

## 2024-09-08 MED ORDER — DIPHENHYDRAMINE HCL 50 MG/ML IJ SOLN
25.0000 mg | Freq: Once | INTRAMUSCULAR | Status: AC
  Administered 2024-09-08: 25 mg via INTRAVENOUS
  Filled 2024-09-08: qty 1

## 2024-09-08 MED ORDER — OXALIPLATIN CHEMO INJECTION 100 MG/20ML
35.0000 mg/m2 | Freq: Once | INTRAVENOUS | Status: AC
  Administered 2024-09-08: 70 mg via INTRAVENOUS
  Filled 2024-09-08: qty 14

## 2024-09-08 MED ORDER — DEXTROSE 5 % IV SOLN: INTRAVENOUS | Status: DC

## 2024-09-08 MED ORDER — SODIUM CHLORIDE 0.9 % IV SOLN
150.0000 mg | Freq: Once | INTRAVENOUS | Status: AC
  Administered 2024-09-08: 150 mg via INTRAVENOUS
  Filled 2024-09-08: qty 150

## 2024-09-08 NOTE — Patient Instructions (Signed)
 Managing Chemotherapy Side Effects, Adult Chemotherapy is a treatment that uses medicine to kill cancer cells. However, in addition to killing cancer cells, the medicines can also damage healthy cells. The damage to healthy cells can lead to side effects. The exact side effects depend on the specific medicines used. Most of the side effects of chemotherapy go away once treatment is finished. Until then, work closely with your health care providers and take an active role in managing your side effects. What are common side effects of chemotherapy? Increased risk of infection, bruising, or bleeding. Nausea and vomiting. Constipation or diarrhea. Loss of appetite. Hair loss. Mouth or throat sores. Tiredness (fatigue). Tingling, pain, or numbness in the hands and feet. Dry, sensitive, itchy, or sore skin. Sleep disturbances, such as excessive sleepiness. Confusion, anxiety, or mood swings. Memory changes. How to manage the side effects of chemotherapy Medicines Take over-the-counter and prescription medicines only as told by your health care provider. Talk with your health care provider before taking vitamins, herbs, supplements, or over-the-counter medicines. Some of these can interfere with chemotherapy. Activity Get plenty of rest. Get regular exercise by doing activities such as walking, gentle yoga, or tai chi. Return to your normal activities as told by your health care provider. Ask your health care provider what activities are safe for you. Eating and drinking  Talk to a dietitian about what you should eat and drink during cancer treatment. Drink enough fluid to keep your urine pale yellow. If you have side effects that affect eating, these tips may help: Eat smaller meals and snacks often. Drink high-nutrition and high-calorie shakes or supplements. Choose bland and soft foods that are easy to eat. Do not eat foods that are hot, spicy, or hard to swallow. Do not eat raw or  undercooked meat, eggs, or seafood. Always wash fresh fruits and vegetables well before eating them. Skin care If you have sore or itchy skin: Wear soft, comfortable clothing. Apply creams and ointments to your skin as told by your health care provider. If you lose your hair, consider wearing a wig, hat, or scarf to cover your head. You may want to have someone shave your head as you start to lose hair. During outdoor activities, protect your head and skin from the sun by using sunscreen with an SPF of 30 or higher or by wearing protective clothing and a hat. Meet with a hair and skin care specialist for makeup and skin care tips. Apply sunscreen to your scalp as told by your health care provider. General tips Learn as much as you can about your condition. If you are struggling emotionally, talk with a mental health care provider or join a support group. Keep all follow-up visits. This is important. How to prevent infection and bleeding Chemotherapy may lower your blood counts and put you at risk for infection and bleeding. Here are some ways to help prevent problems. Vaccines Talk to your health care provider about vaccines. You should not get any live vaccines, such as the polio, MMR, chickenpox, and shingles vaccines until your health care provider says that it is safe to do so. Do not be around people who have had live vaccines for as long as your health care provider recommends. Make sure you get a yearly flu shot. People who will be near you should also get a yearly flu shot. Social activity Stay away from crowded places where you could be exposed to germs. Do not be around people who may be sick or  people who have symptoms of a fever until they have been fever-free for at least 24 hours. Do not share food, cups, straws, or utensils with other people. Wear a mask when outside the home if your blood counts are low. Cleanliness  Wash your hands often for at least 20 seconds. Also make  sure that other members of your household wash their hands often. Brush your teeth twice daily using a soft toothbrush. Use mouth rinse only as told by your health care provider. Take a bath or shower daily unless your health care provider gives different instructions. General tips Take your temperature regularly, especially if you have chills or feel warm. Check with your health care provider: Before you travel. Before you have a dental procedure. Before you use a swimming pool, hot tub, or swim in a lake or ocean. If you get chemotherapy through an IV or port, check the site every day for signs of infection. Check for redness, swelling, pain, fluid, and warmth. Avoid activities that put you at risk for injury or bleeding. Use an electric razor to shave instead of a blade. Questions to ask your health care provider What are the most common side effects of my treatment? How will they affect my daily life? What can I do to manage them? What are some possible long-term side effects? What are possible complications? What support services are available? What number can I call with questions or concerns? Where to find support Cancer affects the entire family. Find out what family support resources are available from your cancer treatment center. For more support, turn to: Your cancer care team. Friends and family. Your religious community. Other people with cancer. Community-based or online support groups. Where to find more information National Cancer Institute: www.cancer.gov American Cancer Society: www.cancer.org Contact a health care provider if: You bleed or bruise more often. You notice blood in your urine or stool. You have any of these symptoms: A skin rash, or dry or itchy skin. A headache or stiff neck. Cold or flu symptoms. A cough. Persistent nausea or vomiting. Persistent diarrhea. Frequent urination, burning when passing urine, or foul-smelling urine. You cannot eat  because of mouth or throat pain. You are sad, confused, anxious, or depressed. Get help right away if: You have any of these symptoms: A fever or chills. Your health care provider should know about this right away. Redness, swelling, pain, fluid, or warmth near an IV site. Bleeding that you cannot stop. A seizure. You cannot swallow. You have chest pain. You have trouble breathing. A family member or caregiver should get help right away if you have a sudden or unusual change in behavior. These symptoms may be an emergency. Get help right away. Call 911. Do not wait to see if the symptoms will go away. Do not drive yourself to the hospital. Summary Chemotherapy is a treatment that uses medicine to kill cancer cells and can cause side effects. The specific side effects depend on the specific medicines used. Learn as much as you can about your condition. Ask about side effects to watch for and how to treat them. Seek out support and resources from others. Find out what family support resources are available from your cancer treatment center. Let your health care provider know if you notice any new, unusual, or worsening symptoms, especially fever or chills. This information is not intended to replace advice given to you by your health care provider. Make sure you discuss any questions you have with your health  care provider. Document Revised: 07/20/2021 Document Reviewed: 07/20/2021 Elsevier Patient Education  2024 [employer].

## 2024-09-10 ENCOUNTER — Inpatient Hospital Stay

## 2024-09-10 NOTE — Patient Instructions (Signed)
 Side Effects From Medicines That Kill Cancer Cells (Chemotherapy) in Adults: What to Know Chemotherapy is a cancer treatment that uses medicines to slow down or stop the growth of cancer. These medicines can also damage healthy cells. The damage to healthy cells can lead to side effects. The side effects depend on the medicines used. Most of the side effects of chemotherapy go away once treatment is finished. During treatment, work closely with your health care providers to manage your side effects. Common side effects of chemotherapy Side effects may include: Increased risk of infection, bruising, or bleeding. Feeling like you may throw up or throwing up. Trouble pooping (constipation) or watery poop (diarrhea). Loss of appetite or changes in how foods taste. Hair loss. Tiredness. Other common side effects may include: Mouth or throat sores. Tingling, pain, or numbness in the hands and feet. Dry, sensitive, itchy, or sore skin. Being very sleepy. Confusion, anxiety, or mood swings. Memory changes. Preventing infection and bleeding Vaccines You should not get any live vaccines or be around people who have had live vaccines until your provider says it's safe. Get a yearly flu shot. People who will be near you should also get one. Social activity Stay away from crowded places. Stay away from people who may be sick or have symptoms of a fever until they have been fever-free for at least 24 hours. Do not share food, cups, straws, or utensils with other people. Wear a mask when outside the home if needed. Cleanliness  Wash your hands often with soap and water  for at least 20 seconds. If you can't use soap and water , use hand sanitizer. Brush your teeth twice daily with a soft toothbrush. Use mouth rinse only as told. Take a bath or shower daily as told. General tips Take your temperature regularly, especially if you have chills or feel warm. Check with your provider: Before you  travel. Before you go to the dentist. Before you use a swimming pool, hot tub, or swim in a lake or ocean. If you get chemotherapy through an IV or port, check the site every day for signs of infection. Check for redness, swelling, pain, fluid, and warmth. Avoid activities that put you at risk for injury or bleeding. Use an electric razor to shave instead of a blade. Follow these instructions at home: Medicines Take your medicines only as told. Ask if you can take vitamins and supplements. They may interfere with your treatment. Eating and drinking Talk to an expert in healthy eating called a dietitian about what you should eat and drink during your treatment. Drink more fluids as told. If you have side effects that affect eating, these tips may help: Eat smaller meals and snacks often. Drink high-nutrition and high-calorie shakes or supplements. Choose bland and soft foods that are easy to eat. Do not eat foods that are hot, spicy, or hard to swallow. Do not eat raw or undercooked meat, eggs, or seafood. Always wash fresh fruits and vegetables. Activity Rest as told. Exercise as told. Ask what things are safe for you to do at home. Ask when you can go back to work or school. Skin care If you have sore or itchy skin: Wear soft, comfortable clothing. Put creams and ointments on your skin as told. If you lose your hair, you could: Consider wearing a wig, hat, or scarf. Get help shaving your head as you start to lose your hair. Protect your head and skin from the sun by using sunscreen and wearing protective  clothing and a hat. Consider meeting with a hair and skin care specialist for makeup and skin care tips. General instructions Learn as much as you can about your condition and treatment. If you are struggling emotionally, talk with a mental health provider or join a support group. Questions to ask your health care provider: What are the most common side effects of my  treatment? How will they affect my daily life? What can I do to manage them? What are some possible long-term side effects? What are possible complications? What support services are available? What number can I call with questions or concerns? Where to find support Cancer affects the entire family. Find out what family support resources are available. For more support, turn to: Your cancer care team. Friends and family. Your religious community. Other people with cancer. Community-based or online support groups. Where to find more information To learn more, go to: [employer] at durangomaps.ca. Click Search and type side effects of cancer treatment. Find the link you need. [employer] at shinprotection.co.uk. Click Search and type chemotherapy side effects. Find the link you need. Contact a health care provider if: You bleed or bruise more often. You notice blood in your pee or poop. You have any of these symptoms: A skin rash, or dry or itchy skin. A headache or stiff neck. Cold or flu symptoms. A cough. A constant feeling like you may throw up or throwing up. Watery poop. Peeing often, burning when peeing, or bad-smelling pee. You can't eat because of mouth or throat pain. You are sad, confused, anxious, or depressed. Get help right away if: You have any of these symptoms: A fever or chills. Any signs of infection near an IV site. Bleeding that you can't stop. A seizure. You can't swallow. You have chest pain. You have trouble breathing. A family member or caregiver should get help right away if you have a sudden or unusual change in behavior. These symptoms may be an emergency. Call 911 right away. Do not wait to see if the symptoms will go away. Do not drive yourself to the hospital. This information is not intended to replace advice given to you by your health care provider. Make sure you discuss any questions you have with your health  care provider. Document Revised: 02/18/2024 Document Reviewed: 02/18/2024 [employer] Patient Education  2025 [employer].

## 2024-09-17 ENCOUNTER — Ambulatory Visit (HOSPITAL_BASED_OUTPATIENT_CLINIC_OR_DEPARTMENT_OTHER)
Admission: RE | Admit: 2024-09-17 | Discharge: 2024-09-17 | Disposition: A | Source: Ambulatory Visit | Attending: Oncology | Admitting: Oncology

## 2024-09-17 DIAGNOSIS — C20 Malignant neoplasm of rectum: Secondary | ICD-10-CM

## 2024-09-17 MED ORDER — GADOBUTROL 1 MMOL/ML IV SOLN
7.8000 mL | Freq: Once | INTRAVENOUS | Status: AC | PRN
  Administered 2024-09-17: 7.8 mL via INTRAVENOUS

## 2024-09-21 ENCOUNTER — Inpatient Hospital Stay: Attending: Oncology

## 2024-09-22 ENCOUNTER — Inpatient Hospital Stay

## 2024-09-22 ENCOUNTER — Inpatient Hospital Stay: Admitting: Oncology

## 2024-09-24 ENCOUNTER — Other Ambulatory Visit (HOSPITAL_BASED_OUTPATIENT_CLINIC_OR_DEPARTMENT_OTHER): Admitting: Radiology

## 2024-09-24 ENCOUNTER — Inpatient Hospital Stay

## 2024-10-12 ENCOUNTER — Ambulatory Visit: Admitting: Podiatry

## 2024-11-23 ENCOUNTER — Ambulatory Visit: Admitting: Family Medicine

## 2025-05-06 ENCOUNTER — Ambulatory Visit
